# Patient Record
Sex: Male | Born: 1935 | Race: Black or African American | Hispanic: No | Marital: Single | Smoking: Former smoker
Health system: Southern US, Community
[De-identification: ages and names within clinical notes are randomized; demographics above are authoritative.]

## PROBLEM LIST (undated history)

## (undated) ENCOUNTER — Emergency Department (HOSPITAL_COMMUNITY): Admission: EM | Discharge status: Absent without leave | Payer: Self-pay

## (undated) DIAGNOSIS — N059 Unspecified nephritic syndrome with unspecified morphologic changes: Secondary | ICD-10-CM

## (undated) DIAGNOSIS — R0602 Shortness of breath: Secondary | ICD-10-CM

## (undated) DIAGNOSIS — D649 Anemia, unspecified: Secondary | ICD-10-CM

## (undated) DIAGNOSIS — R0600 Dyspnea, unspecified: Secondary | ICD-10-CM

## (undated) DIAGNOSIS — E7800 Pure hypercholesterolemia, unspecified: Secondary | ICD-10-CM

## (undated) DIAGNOSIS — J189 Pneumonia, unspecified organism: Secondary | ICD-10-CM

## (undated) DIAGNOSIS — I10 Essential (primary) hypertension: Secondary | ICD-10-CM

## (undated) DIAGNOSIS — Z8679 Personal history of other diseases of the circulatory system: Secondary | ICD-10-CM

## (undated) DIAGNOSIS — M1990 Unspecified osteoarthritis, unspecified site: Secondary | ICD-10-CM

## (undated) DIAGNOSIS — E119 Type 2 diabetes mellitus without complications: Secondary | ICD-10-CM

## (undated) DIAGNOSIS — Z8701 Personal history of pneumonia (recurrent): Secondary | ICD-10-CM

## (undated) DIAGNOSIS — N189 Chronic kidney disease, unspecified: Secondary | ICD-10-CM

## (undated) DIAGNOSIS — N289 Disorder of kidney and ureter, unspecified: Secondary | ICD-10-CM

## (undated) DIAGNOSIS — I499 Cardiac arrhythmia, unspecified: Secondary | ICD-10-CM

## (undated) DIAGNOSIS — R55 Syncope and collapse: Secondary | ICD-10-CM

## (undated) DIAGNOSIS — J40 Bronchitis, not specified as acute or chronic: Secondary | ICD-10-CM

## (undated) HISTORY — PX: HYDROCELE EXCISION: SHX482

## (undated) HISTORY — PX: CATARACT EXTRACTION W/ INTRAOCULAR LENS IMPLANT: SHX1309

---

## 2001-06-15 ENCOUNTER — Encounter: Payer: Self-pay | Admitting: Cardiology

## 2001-06-15 ENCOUNTER — Encounter: Admission: RE | Admit: 2001-06-15 | Discharge: 2001-06-15 | Payer: Self-pay | Admitting: Cardiology

## 2002-04-21 ENCOUNTER — Emergency Department (HOSPITAL_COMMUNITY): Admission: EM | Admit: 2002-04-21 | Discharge: 2002-04-21 | Payer: Self-pay | Admitting: Emergency Medicine

## 2002-07-11 ENCOUNTER — Encounter: Payer: Self-pay | Admitting: Emergency Medicine

## 2002-07-11 ENCOUNTER — Emergency Department (HOSPITAL_COMMUNITY): Admission: EM | Admit: 2002-07-11 | Discharge: 2002-07-11 | Payer: Self-pay | Admitting: Emergency Medicine

## 2005-05-03 ENCOUNTER — Ambulatory Visit (HOSPITAL_COMMUNITY): Admission: RE | Admit: 2005-05-03 | Discharge: 2005-05-03 | Payer: Self-pay | Admitting: Internal Medicine

## 2005-05-17 ENCOUNTER — Emergency Department (HOSPITAL_COMMUNITY): Admission: EM | Admit: 2005-05-17 | Discharge: 2005-05-18 | Payer: Self-pay | Admitting: Emergency Medicine

## 2008-02-25 ENCOUNTER — Emergency Department (HOSPITAL_COMMUNITY): Admission: EM | Admit: 2008-02-25 | Discharge: 2008-02-25 | Payer: Self-pay | Admitting: Emergency Medicine

## 2008-10-14 ENCOUNTER — Ambulatory Visit (HOSPITAL_COMMUNITY): Admission: RE | Admit: 2008-10-14 | Discharge: 2008-10-14 | Payer: Self-pay | Admitting: Cardiology

## 2010-05-18 ENCOUNTER — Emergency Department (HOSPITAL_COMMUNITY)
Admission: EM | Admit: 2010-05-18 | Discharge: 2010-05-18 | Payer: Self-pay | Source: Home / Self Care | Admitting: Emergency Medicine

## 2011-07-31 ENCOUNTER — Emergency Department (HOSPITAL_COMMUNITY)
Admission: EM | Admit: 2011-07-31 | Discharge: 2011-08-01 | Disposition: A | Discharge status: Home / Self Care | Payer: Medicare Other | Attending: Emergency Medicine | Admitting: Emergency Medicine

## 2011-07-31 DIAGNOSIS — E1169 Type 2 diabetes mellitus with other specified complication: Secondary | ICD-10-CM | POA: Insufficient documentation

## 2011-07-31 DIAGNOSIS — I10 Essential (primary) hypertension: Secondary | ICD-10-CM | POA: Insufficient documentation

## 2011-07-31 DIAGNOSIS — E7800 Pure hypercholesterolemia, unspecified: Secondary | ICD-10-CM | POA: Insufficient documentation

## 2011-07-31 LAB — URINALYSIS, ROUTINE W REFLEX MICROSCOPIC
Glucose, UA: NEGATIVE mg/dL
Hgb urine dipstick: NEGATIVE
Ketones, ur: NEGATIVE mg/dL
Leukocytes, UA: NEGATIVE
Nitrite: NEGATIVE
Protein, ur: NEGATIVE mg/dL
Specific Gravity, Urine: 1.017 (ref 1.005–1.030)
pH: 7 (ref 5.0–8.0)

## 2011-07-31 LAB — DIFFERENTIAL
Basophils Relative: 0 % (ref 0–1)
Eosinophils Absolute: 0.2 10*3/uL (ref 0.0–0.7)
Eosinophils Relative: 4 % (ref 0–5)
Neutro Abs: 3.4 10*3/uL (ref 1.7–7.7)

## 2011-08-01 LAB — COMPREHENSIVE METABOLIC PANEL
BUN: 17 mg/dL (ref 6–23)
CO2: 35 mEq/L — ABNORMAL HIGH (ref 19–32)
Calcium: 9.5 mg/dL (ref 8.4–10.5)
Creatinine, Ser: 1.05 mg/dL (ref 0.50–1.35)
Glucose, Bld: 163 mg/dL — ABNORMAL HIGH (ref 70–99)
Potassium: 3.4 mEq/L — ABNORMAL LOW (ref 3.5–5.1)
Total Bilirubin: 0.2 mg/dL — ABNORMAL LOW (ref 0.3–1.2)
Total Protein: 7.8 g/dL (ref 6.0–8.3)

## 2011-08-09 LAB — COMPREHENSIVE METABOLIC PANEL
AST: 25
Albumin: 3.5
Alkaline Phosphatase: 42
BUN: 9
CO2: 35 — ABNORMAL HIGH
Calcium: 8.8
Chloride: 98
Creatinine, Ser: 1.15
GFR calc Af Amer: >60
Potassium: 3.3 — ABNORMAL LOW
Sodium: 140
Total Bilirubin: 0.7

## 2011-08-09 LAB — CBC
HCT: 40.3
Hemoglobin: 13.9
MCV: 79.7
RBC: 5.06
WBC: 5.4

## 2011-08-09 LAB — DIFFERENTIAL
Lymphs Abs: 1.7
Monocytes Absolute: 0.7
Neutro Abs: 2.9

## 2011-11-21 ENCOUNTER — Emergency Department (HOSPITAL_COMMUNITY)
Admission: EM | Admit: 2011-11-21 | Discharge: 2011-11-21 | Disposition: A | Discharge status: Home / Self Care | Payer: Medicare Other | Attending: Emergency Medicine | Admitting: Emergency Medicine

## 2011-11-21 ENCOUNTER — Encounter: Payer: Self-pay | Admitting: Emergency Medicine

## 2011-11-21 ENCOUNTER — Other Ambulatory Visit: Payer: Self-pay

## 2011-11-21 ENCOUNTER — Emergency Department (HOSPITAL_COMMUNITY): Payer: Medicare Other

## 2011-11-21 DIAGNOSIS — E119 Type 2 diabetes mellitus without complications: Secondary | ICD-10-CM | POA: Insufficient documentation

## 2011-11-21 DIAGNOSIS — R29810 Facial weakness: Secondary | ICD-10-CM | POA: Insufficient documentation

## 2011-11-21 DIAGNOSIS — I10 Essential (primary) hypertension: Secondary | ICD-10-CM | POA: Insufficient documentation

## 2011-11-21 DIAGNOSIS — I639 Cerebral infarction, unspecified: Secondary | ICD-10-CM

## 2011-11-21 DIAGNOSIS — I6350 Cerebral infarction due to unspecified occlusion or stenosis of unspecified cerebral artery: Secondary | ICD-10-CM | POA: Insufficient documentation

## 2011-11-21 DIAGNOSIS — R42 Dizziness and giddiness: Secondary | ICD-10-CM | POA: Insufficient documentation

## 2011-11-21 DIAGNOSIS — E7800 Pure hypercholesterolemia, unspecified: Secondary | ICD-10-CM | POA: Insufficient documentation

## 2011-11-21 DIAGNOSIS — Z79899 Other long term (current) drug therapy: Secondary | ICD-10-CM | POA: Insufficient documentation

## 2011-11-21 DIAGNOSIS — L0231 Cutaneous abscess of buttock: Secondary | ICD-10-CM | POA: Insufficient documentation

## 2011-11-21 HISTORY — DX: Pure hypercholesterolemia, unspecified: E78.00

## 2011-11-21 HISTORY — DX: Essential (primary) hypertension: I10

## 2011-11-21 LAB — DIFFERENTIAL
Basophils Relative: 0 % (ref 0–1)
Monocytes Relative: 8 % (ref 3–12)
Neutrophils Relative %: 66 % (ref 43–77)

## 2011-11-21 LAB — BASIC METABOLIC PANEL
BUN: 27 mg/dL — ABNORMAL HIGH (ref 6–23)
CO2: 32 mEq/L (ref 19–32)
Calcium: 9 mg/dL (ref 8.4–10.5)
Chloride: 98 mEq/L (ref 96–112)
GFR calc Af Amer: 28 mL/min — ABNORMAL LOW (ref >90)
GFR calc non Af Amer: 24 mL/min — ABNORMAL LOW (ref >90)

## 2011-11-21 LAB — CBC
MCH: 26.5 pg (ref 26.0–34.0)
Platelets: 162 10*3/uL (ref 150–400)
RBC: 4.23 MIL/uL (ref 4.22–5.81)

## 2011-11-21 LAB — GLUCOSE, CAPILLARY
Glucose-Capillary: 117 mg/dL — ABNORMAL HIGH (ref 70–99)
Glucose-Capillary: 100 mg/dL — ABNORMAL HIGH (ref 70–99)

## 2011-11-21 LAB — TROPONIN I: Troponin I: <0.3 ng/mL (ref <0.30)

## 2011-11-21 MED ORDER — POTASSIUM CHLORIDE 20 MEQ/15ML (10%) PO LIQD
40.0000 meq | Freq: Once | ORAL | Status: AC
  Administered 2011-11-21: 40 meq via ORAL
  Filled 2011-11-21: qty 30

## 2011-11-21 MED ORDER — SODIUM CHLORIDE 0.9 % IV BOLUS (SEPSIS)
1000.0000 mL | Freq: Once | INTRAVENOUS | Status: AC
  Administered 2011-11-21: 1000 mL via INTRAVENOUS

## 2011-11-21 NOTE — ED Provider Notes (Signed)
History     CSN: [id number]  Arrival date & time 11/21/11  1419   First MD Initiated Contact with Patient 11/21/11 1717      Chief Complaint  Patient presents with  . Dizziness    (Consider location/radiation/quality/duration/timing/severity/associated sxs/prior treatment) HPI history obtained from patient, spouse Patient is a 77 year old male with history hypertension, dyslipidemia, and diabetes who presents with vertigo. This started about 5 weeks ago. There is abrupt in onset has been constant. He describes a room spinning sensation which is worse when he changes position or moves quickly. The overall waxes and wanes. He had one episode of syncope about 5 weeks ago when this first started. He has had no dysarthria or dysphasia. He is also not had any extremity weakness or sensation changes. Nothing has been noted to make the symptoms better. Patient has not had any recent cough, URI symptoms. He denies ear pain, tinnitus, neck pain. No passing out in the last 5 weeks. Patient was seen in emergency department in [place], [place] about 2 weeks ago for the same symptoms. At that time he obtained a CT scan of head, MRI head, carotid ultrasound, cardiac echo. He is unsure of the results of these tests because he left AMA (to get home to family for Christmas).  He presents today for evaluation considering persistent symptoms. He has been able to ambulate though notes some unsteadiness. He does not fall exclusively to one side. Overall severity moderate.  Past Medical History  Diagnosis Date  . Hypertension   . Diabetes mellitus   . Hypercholesterolemia     History reviewed. No pertinent past surgical history.  History reviewed. No pertinent family history.  History  Substance Use Topics  . Smoking status: Never Smoker   . Smokeless tobacco: Not on file  . Alcohol Use: No      Review of Systems  Constitutional: Negative for fever, chills and activity change.  HENT: Negative for  congestion and neck pain.   Respiratory: Negative for cough, chest tightness, shortness of breath and wheezing.   Cardiovascular: Negative for chest pain.  Gastrointestinal: Negative for nausea, vomiting, abdominal pain, diarrhea and abdominal distention.  Genitourinary: Negative for difficulty urinating.  Musculoskeletal: Negative for gait problem.  Skin:       Small abscess on left buttock that drained spontaneously today. No surrounding redness. Pain improved after drainage.  Neurological: Positive for dizziness. Negative for seizures, speech difficulty, weakness, light-headedness, numbness and headaches.  Psychiatric/Behavioral: Negative for behavioral problems and confusion.  All other systems reviewed and are negative.    Allergies  Review of patient's allergies indicates no known allergies.  Home Medications   Current Outpatient Rx  Name Route Sig Dispense Refill  . ALLOPURINOL 100 MG PO TABS Oral Take 100 mg by mouth daily.      . ASPIRIN 81 MG PO TBEC Oral Take 81 mg by mouth daily. Swallow whole.     [name] CARVEDILOL 3.125 MG PO TABS Oral Take 3.125 mg by mouth 2 (two) times daily with a meal.      . COLCHICINE 0.6 MG PO TABS Oral Take 0.6 mg by mouth 2 (two) times daily as needed. For gout     . DUTASTERIDE 0.5 MG PO CAPS Oral Take 0.5 mg by mouth daily.      [name] GLIMEPIRIDE 4 MG PO TABS Oral Take 4 mg by mouth daily before breakfast.      . GLIPIZIDE 10 MG PO TABS Oral Take 10 mg by  mouth 2 (two) times daily before a meal.      . GLYBURIDE-METFORMIN 5-500 MG PO TABS Oral Take 1 tablet by mouth 2 (two) times daily with a meal.      . HYDRALAZINE HCL 100 MG PO TABS Oral Take 100 mg by mouth 2 (two) times daily.      [name] NAPROXEN SODIUM 220 MG PO TABS Oral Take 220 mg by mouth 2 (two) times daily with a meal.      . TAMSULOSIN HCL 0.4 MG PO CAPS Oral Take 0.4 mg by mouth daily.      [name] TERAZOSIN HCL 5 MG PO CAPS Oral Take 5 mg by mouth at bedtime.      [name] VALSARTAN-HYDROCHLOROTHIAZIDE  320-25 MG PO TABS Oral Take 1 tablet by mouth daily.      [name] VITAMIN D (ERGOCALCIFEROL) 50000 UNITS PO CAPS Oral Take 50,000 Units by mouth every 7 (seven) days. Take on sundays       BP 173/80  Pulse 61  Temp(Src) 98.4 F (36.9 C) (Oral)  Resp 16  SpO2 99%  Physical Exam  Nursing note and vitals reviewed. Constitutional: He is oriented to person, place, and time. He appears well-developed and well-nourished. No distress.  HENT:  Head: Normocephalic and atraumatic.  Nose: Nose normal.  Eyes: EOM are normal. Pupils are equal, round, and reactive to light. No scleral icterus.  Neck: Normal range of motion. Neck supple.  Cardiovascular: Normal rate, regular rhythm and intact distal pulses.   No murmur heard. Pulmonary/Chest: Effort normal and breath sounds normal. No respiratory distress. He has no wheezes.  Abdominal: Soft. Bowel sounds are normal. He exhibits no distension. There is no tenderness.  Genitourinary:       Small abscess on left buttock. Several inches from the anus. This has drained spontaneously and has minimal drainage at this time. Opening present. Minimal induration. No drainable pocket on palpation.  Musculoskeletal: Normal range of motion. He exhibits no edema and no tenderness.  Neurological: He is alert and oriented to person, place, and time. He has normal strength. No sensory deficit. Coordination and gait normal. GCS eye subscore is 4. GCS verbal subscore is 5. GCS motor subscore is 6.       Normal strength Normal finger to nose bilaterally Normal heel to shin bilaterally  Mild left-sided facial droop, however spouse does not feel like his smile significantly different. No other cranial nerve abnormality on exam.  Patient able to ambulate all the way around emergency Department without assistance.  Skin: Skin is warm and dry. He is not diaphoretic.  Psychiatric: He has a normal mood and affect. His behavior is normal. Thought content normal.    ED Course    Procedures (including critical care time)  ECG on 11/21/2011 at 1740: Heart rate 60. Sinus rhythm. Normal axis. Right bundle branch block. CT normal. Nonspecific T wave change on anteriorly. No ischemic ST changes. When compared to ECG from 05/17/2005, no significant changes. Right bundle branch block present on prior.  Labs Reviewed  CBC - Abnormal; Notable for the following:    Hemoglobin 11.2 (*)    HCT 33.2 (*)    All other components within normal limits  BASIC METABOLIC PANEL - Abnormal; Notable for the following:    Potassium 3.3 (*)    Glucose, Bld 115 (*)    BUN 27 (*)    Creatinine, Ser 2.42 (*)    GFR calc non Af Amer 24 (*)    GFR  calc Af Amer 28 (*)    All other components within normal limits  GLUCOSE, CAPILLARY - Abnormal; Notable for the following:    Glucose-Capillary 117 (*)    All other components within normal limits  GLUCOSE, CAPILLARY - Abnormal; Notable for the following:    Glucose-Capillary 100 (*)    All other components within normal limits  DIFFERENTIAL  TROPONIN I  POCT CBG MONITORING  POCT CBG MONITORING   Ct Head Wo Contrast  11/21/2011  *RADIOLOGY REPORT*  Clinical Data: Dizziness and weakness  CT HEAD WITHOUT CONTRAST  Technique:  Contiguous axial images were obtained from the base of the skull through the vertex without contrast.  Comparison: None.  Findings: No acute intracranial hemorrhage.  No focal mass lesion. No CT evidence of acute infarction.   No midline shift or mass effect.  No hydrocephalus.  Basilar cisterns are patent.  There is generalized cortical atrophy and periventricular white matter hypodensities.  Paranasal sinuses and mastoid air cells are clear.  Orbits are normal.  IMPRESSION:  1.  No acute intracranial findings. 2.  Atrophy and microvascular disease.  Original Report Authenticated By: Suzy Bouchard, M.D.     1. Acute ischemic stroke   2. Abscess of buttock, left       MDM   Patient here with acute onset vertigo  that started about 5 weeks ago. Based on his history and risk factors, concern for cerebellar infarct. He was seen and extensively worked up at [facility] in [place], however he does not have those results. His neurologic exam shows probable mild left facial droop otherwise no focal neurologic deficits. CT scan here unremarkable. Multiple attempts made to obtain records from [place], however medical records is closing [place] ED unable to compile those results. Since he did have such an extensive workup in the setting of same symptoms, no need to repeat MRI today. Patient is able to tolerate food and drink and emergency department. He is also able to ambulate without assistance and without fear of falling. He clinically has acute stroke and will need continued evaluation. It is also critical to obtain outside records from prior workup. Patient has follow up appointment with his primary care doctor already scheduled for tomorrow morning. Based on patient's longevity of symptoms, previous workup, and currently high functional status; there is no acute indication for admission. Patient and his spouse are in agreement with this. He'll follow up with her primary care doctor and follow up with neurology outpatient. We discussed specific return precautions.  Patient also has mild increase in his creatinine. This is likely prerenal based on decreased by mouth intake over the last couple days and elevated BUN as well. IV fluids given and patient tolerating by mouth well here. Recommend outpatient followup of renal function. I discussed these findings with patient and spouse and provided the lab results.  Patient noted to have increasing hypertension lung emergency department. He did miss his evening dose of hydralazine and carvedilol. Those were given from his own supply. No indication that presenting symptoms are related to this acute hypertension.  He does have mild left buttock abscess which drained on its own. There is  nothing drainable on evaluation today. He was to followup with his PCP for this.        Fritz Pickerel 11/21/11 2327

## 2011-11-21 NOTE — ED Notes (Signed)
CBG results = 100.

## 2011-11-21 NOTE — ED Notes (Signed)
Patient is AOx4 at comfortable with the idea of being discharged to home.

## 2011-11-21 NOTE — ED Notes (Signed)
Patient declined CBG.

## 2011-11-21 NOTE — ED Notes (Signed)
Advised MD patient's K+ = 3.3.

## 2011-11-21 NOTE — ED Notes (Signed)
Family at bedside. 

## 2011-11-21 NOTE — ED Notes (Signed)
Pt sts dizzy since December 23rd; pt sts was in hospital in [place] and signed out [place]; pt sts not any better; pt sts generalized weakness and sts balance problems; pt sts worse with position change

## 2011-11-21 NOTE — ED Notes (Signed)
Patient took home bedtime meds for HTN, BPH and arthritis.  Patient took one Hydralazine, Coreg, Flomax and Aleve per Dr Abran Duke order.

## 2011-11-21 NOTE — ED Notes (Signed)
Patient is resting comfortably. 

## 2011-11-22 NOTE — ED Provider Notes (Signed)
I have seen and examined this patient with the resident.  I agree with the resident's note, assessment and plan except as indicated.     Lezlie Octave, MD 11/22/11 [phone]

## 2011-11-23 ENCOUNTER — Inpatient Hospital Stay (HOSPITAL_COMMUNITY)
Admission: EM | Admit: 2011-11-23 | Discharge: 2011-12-17 | DRG: 683 | Disposition: A | Discharge status: Skilled Nursing Facility | Payer: Medicare Other | Source: Ambulatory Visit | Attending: Nephrology | Admitting: Nephrology

## 2011-11-23 ENCOUNTER — Encounter (HOSPITAL_COMMUNITY): Payer: Self-pay | Admitting: Emergency Medicine

## 2011-11-23 ENCOUNTER — Inpatient Hospital Stay (HOSPITAL_COMMUNITY): Payer: Medicare Other

## 2011-11-23 ENCOUNTER — Other Ambulatory Visit: Payer: Self-pay

## 2011-11-23 ENCOUNTER — Emergency Department (HOSPITAL_COMMUNITY): Payer: Medicare Other

## 2011-11-23 DIAGNOSIS — Z8249 Family history of ischemic heart disease and other diseases of the circulatory system: Secondary | ICD-10-CM

## 2011-11-23 DIAGNOSIS — Z833 Family history of diabetes mellitus: Secondary | ICD-10-CM

## 2011-11-23 DIAGNOSIS — E119 Type 2 diabetes mellitus without complications: Secondary | ICD-10-CM | POA: Diagnosis present

## 2011-11-23 DIAGNOSIS — H8110 Benign paroxysmal vertigo, unspecified ear: Secondary | ICD-10-CM | POA: Diagnosis present

## 2011-11-23 DIAGNOSIS — Y9210 Unspecified residential institution as the place of occurrence of the external cause: Secondary | ICD-10-CM | POA: Diagnosis present

## 2011-11-23 DIAGNOSIS — R310 Gross hematuria: Secondary | ICD-10-CM | POA: Diagnosis not present

## 2011-11-23 DIAGNOSIS — D509 Iron deficiency anemia, unspecified: Secondary | ICD-10-CM | POA: Diagnosis present

## 2011-11-23 DIAGNOSIS — E876 Hypokalemia: Secondary | ICD-10-CM | POA: Diagnosis not present

## 2011-11-23 DIAGNOSIS — E1169 Type 2 diabetes mellitus with other specified complication: Secondary | ICD-10-CM | POA: Diagnosis present

## 2011-11-23 DIAGNOSIS — Z79899 Other long term (current) drug therapy: Secondary | ICD-10-CM

## 2011-11-23 DIAGNOSIS — E7800 Pure hypercholesterolemia, unspecified: Secondary | ICD-10-CM | POA: Diagnosis present

## 2011-11-23 DIAGNOSIS — E162 Hypoglycemia, unspecified: Secondary | ICD-10-CM | POA: Diagnosis present

## 2011-11-23 DIAGNOSIS — E785 Hyperlipidemia, unspecified: Secondary | ICD-10-CM | POA: Diagnosis present

## 2011-11-23 DIAGNOSIS — N179 Acute kidney failure, unspecified: Principal | ICD-10-CM | POA: Diagnosis present

## 2011-11-23 DIAGNOSIS — I10 Essential (primary) hypertension: Secondary | ICD-10-CM | POA: Diagnosis present

## 2011-11-23 DIAGNOSIS — IMO0002 Reserved for concepts with insufficient information to code with codable children: Secondary | ICD-10-CM | POA: Diagnosis not present

## 2011-11-23 DIAGNOSIS — M109 Gout, unspecified: Secondary | ICD-10-CM | POA: Diagnosis present

## 2011-11-23 DIAGNOSIS — R042 Hemoptysis: Secondary | ICD-10-CM | POA: Diagnosis not present

## 2011-11-23 DIAGNOSIS — I129 Hypertensive chronic kidney disease with stage 1 through stage 4 chronic kidney disease, or unspecified chronic kidney disease: Secondary | ICD-10-CM | POA: Diagnosis present

## 2011-11-23 DIAGNOSIS — Z794 Long term (current) use of insulin: Secondary | ICD-10-CM

## 2011-11-23 DIAGNOSIS — D62 Acute posthemorrhagic anemia: Secondary | ICD-10-CM | POA: Diagnosis not present

## 2011-11-23 DIAGNOSIS — R55 Syncope and collapse: Secondary | ICD-10-CM

## 2011-11-23 DIAGNOSIS — N189 Chronic kidney disease, unspecified: Secondary | ICD-10-CM | POA: Diagnosis present

## 2011-11-23 DIAGNOSIS — R42 Dizziness and giddiness: Secondary | ICD-10-CM | POA: Diagnosis present

## 2011-11-23 DIAGNOSIS — Z87891 Personal history of nicotine dependence: Secondary | ICD-10-CM

## 2011-11-23 DIAGNOSIS — Y849 Medical procedure, unspecified as the cause of abnormal reaction of the patient, or of later complication, without mention of misadventure at the time of the procedure: Secondary | ICD-10-CM | POA: Diagnosis present

## 2011-11-23 DIAGNOSIS — Z7982 Long term (current) use of aspirin: Secondary | ICD-10-CM

## 2011-11-23 DIAGNOSIS — N19 Unspecified kidney failure: Secondary | ICD-10-CM | POA: Diagnosis present

## 2011-11-23 DIAGNOSIS — R5381 Other malaise: Secondary | ICD-10-CM | POA: Diagnosis not present

## 2011-11-23 HISTORY — DX: Syncope and collapse: R55

## 2011-11-23 HISTORY — DX: Unspecified osteoarthritis, unspecified site: M19.90

## 2011-11-23 HISTORY — DX: Shortness of breath: R06.02

## 2011-11-23 HISTORY — DX: Bronchitis, not specified as acute or chronic: J40

## 2011-11-23 LAB — COMPREHENSIVE METABOLIC PANEL
AST: 21 U/L (ref 0–37)
Albumin: 3 g/dL — ABNORMAL LOW (ref 3.5–5.2)
Alkaline Phosphatase: 47 U/L (ref 39–117)
Chloride: 100 mEq/L (ref 96–112)
Creatinine, Ser: 2.55 mg/dL — ABNORMAL HIGH (ref 0.50–1.35)
Potassium: 3.7 mEq/L (ref 3.5–5.1)
Total Bilirubin: 0.3 mg/dL (ref 0.3–1.2)
Total Protein: 7.2 g/dL (ref 6.0–8.3)

## 2011-11-23 LAB — CARDIAC PANEL(CRET KIN+CKTOT+MB+TROPI)
CK, MB: 1.7 ng/mL (ref 0.3–4.0)
Relative Index: INVALID (ref 0.0–2.5)
Total CK: 87 U/L (ref 7–232)

## 2011-11-23 LAB — DIFFERENTIAL
Basophils Absolute: 0 10*3/uL (ref 0.0–0.1)
Basophils Relative: 0 % (ref 0–1)
Neutro Abs: 3.7 10*3/uL (ref 1.7–7.7)
Neutrophils Relative %: 68 % (ref 43–77)

## 2011-11-23 LAB — CBC
MCHC: 34.1 g/dL (ref 30.0–36.0)
RDW: 14.2 % (ref 11.5–15.5)

## 2011-11-23 LAB — URINE MICROSCOPIC-ADD ON

## 2011-11-23 LAB — GLUCOSE, CAPILLARY: Glucose-Capillary: 172 mg/dL — ABNORMAL HIGH (ref 70–99)

## 2011-11-23 LAB — URINALYSIS, ROUTINE W REFLEX MICROSCOPIC
Glucose, UA: NEGATIVE mg/dL
Protein, ur: NEGATIVE mg/dL
pH: 6 (ref 5.0–8.0)

## 2011-11-23 LAB — PROTIME-INR: Prothrombin Time: 14.3 seconds (ref 11.6–15.2)

## 2011-11-23 LAB — APTT: aPTT: 42 seconds — ABNORMAL HIGH (ref 24–37)

## 2011-11-23 MED ORDER — CARVEDILOL 3.125 MG PO TABS
3.1250 mg | ORAL_TABLET | Freq: Two times a day (BID) | ORAL | Status: DC
  Administered 2011-11-24 – 2011-11-26 (×5): 3.125 mg via ORAL
  Filled 2011-11-23 (×8): qty 1

## 2011-11-23 MED ORDER — GLUCOSE 40 % PO GEL: 1.0000 | ORAL | Status: DC | PRN

## 2011-11-23 MED ORDER — ACETAMINOPHEN 325 MG PO TABS
650.0000 mg | ORAL_TABLET | Freq: Four times a day (QID) | ORAL | Status: DC | PRN
  Administered 2011-12-09: 650 mg via ORAL
  Filled 2011-11-23: qty 2

## 2011-11-23 MED ORDER — PNEUMOCOCCAL VAC POLYVALENT 25 MCG/0.5ML IJ INJ
0.5000 mL | INJECTION | INTRAMUSCULAR | Status: AC
  Administered 2011-11-24: 0.5 mL via INTRAMUSCULAR
  Filled 2011-11-23: qty 0.5

## 2011-11-23 MED ORDER — MECLIZINE HCL 25 MG PO TABS
25.0000 mg | ORAL_TABLET | Freq: Three times a day (TID) | ORAL | Status: DC | PRN
  Administered 2011-11-24: 25 mg via ORAL
  Filled 2011-11-23: qty 1

## 2011-11-23 MED ORDER — ONDANSETRON HCL 4 MG/2ML IJ SOLN: 4.0000 mg | Freq: Four times a day (QID) | INTRAMUSCULAR | Status: DC | PRN

## 2011-11-23 MED ORDER — TAMSULOSIN HCL 0.4 MG PO CAPS
0.4000 mg | ORAL_CAPSULE | Freq: Every day | ORAL | Status: DC
  Administered 2011-11-24 – 2011-12-05 (×12): 0.4 mg via ORAL
  Filled 2011-11-23 (×13): qty 1

## 2011-11-23 MED ORDER — ALBUTEROL SULFATE (5 MG/ML) 0.5% IN NEBU
2.5000 mg | INHALATION_SOLUTION | RESPIRATORY_TRACT | Status: DC | PRN
  Administered 2011-11-29: 2.5 mg via RESPIRATORY_TRACT
  Filled 2011-11-23: qty 0.5

## 2011-11-23 MED ORDER — ACETAMINOPHEN 650 MG RE SUPP: 650.0000 mg | Freq: Four times a day (QID) | RECTAL | Status: DC | PRN

## 2011-11-23 MED ORDER — DUTASTERIDE 0.5 MG PO CAPS
0.5000 mg | ORAL_CAPSULE | Freq: Every day | ORAL | Status: DC
  Administered 2011-11-24 – 2011-12-17 (×23): 0.5 mg via ORAL
  Filled 2011-11-23 (×24): qty 1

## 2011-11-23 MED ORDER — ALUM & MAG HYDROXIDE-SIMETH 200-200-20 MG/5ML PO SUSP
30.0000 mL | Freq: Four times a day (QID) | ORAL | Status: DC | PRN
  Filled 2011-11-23: qty 30

## 2011-11-23 MED ORDER — GLUCOSE-VITAMIN C 4-6 GM-MG PO CHEW: 4.0000 | CHEWABLE_TABLET | ORAL | Status: DC | PRN

## 2011-11-23 MED ORDER — GUAIFENESIN-DM 100-10 MG/5ML PO SYRP
5.0000 mL | ORAL_SOLUTION | ORAL | Status: DC | PRN
  Filled 2011-11-23: qty 5

## 2011-11-23 MED ORDER — SODIUM CHLORIDE 0.9 % IV SOLN
INTRAVENOUS | Status: AC
  Administered 2011-11-23: via INTRAVENOUS

## 2011-11-23 MED ORDER — ONDANSETRON HCL 4 MG PO TABS
4.0000 mg | ORAL_TABLET | Freq: Four times a day (QID) | ORAL | Status: DC | PRN
  Administered 2011-12-13: 4 mg via ORAL
  Filled 2011-11-23: qty 1

## 2011-11-23 MED ORDER — TERAZOSIN HCL 5 MG PO CAPS
5.0000 mg | ORAL_CAPSULE | Freq: Every day | ORAL | Status: DC
  Administered 2011-11-24 – 2011-12-05 (×12): 5 mg via ORAL
  Filled 2011-11-23 (×14): qty 1

## 2011-11-23 MED ORDER — ALLOPURINOL 100 MG PO TABS
100.0000 mg | ORAL_TABLET | Freq: Every day | ORAL | Status: DC
  Administered 2011-11-24 – 2011-11-27 (×4): 100 mg via ORAL
  Filled 2011-11-23 (×4): qty 1

## 2011-11-23 MED ORDER — HYDRALAZINE HCL 50 MG PO TABS
100.0000 mg | ORAL_TABLET | Freq: Two times a day (BID) | ORAL | Status: DC
  Administered 2011-11-23 – 2011-12-17 (×44): 100 mg via ORAL
  Filled 2011-11-23 (×51): qty 2

## 2011-11-23 MED ORDER — INFLUENZA VIRUS VACC SPLIT PF IM SUSP
0.5000 mL | INTRAMUSCULAR | Status: AC
  Administered 2011-11-24: 0.5 mL via INTRAMUSCULAR
  Filled 2011-11-23: qty 0.5

## 2011-11-23 MED ORDER — ASPIRIN EC 81 MG PO TBEC
81.0000 mg | DELAYED_RELEASE_TABLET | Freq: Every day | ORAL | Status: DC
  Administered 2011-11-24 – 2011-11-29 (×6): 81 mg via ORAL
  Filled 2011-11-23 (×6): qty 1

## 2011-11-23 MED ORDER — INSULIN ASPART 100 UNIT/ML ~~LOC~~ SOLN
0.0000 [IU] | SUBCUTANEOUS | Status: DC
  Administered 2011-11-24 – 2011-11-25 (×4): 1 [IU] via SUBCUTANEOUS
  Administered 2011-11-25: 2 [IU] via SUBCUTANEOUS
  Administered 2011-11-25 (×2): 1 [IU] via SUBCUTANEOUS
  Administered 2011-11-25 – 2011-11-26 (×2): 2 [IU] via SUBCUTANEOUS
  Administered 2011-11-26 (×2): 1 [IU] via SUBCUTANEOUS
  Administered 2011-11-27: 3 [IU] via SUBCUTANEOUS
  Administered 2011-11-27: 1 [IU] via SUBCUTANEOUS
  Administered 2011-11-27: 2 [IU] via SUBCUTANEOUS
  Administered 2011-11-28: 1 [IU] via SUBCUTANEOUS
  Administered 2011-11-28: 2 [IU] via SUBCUTANEOUS
  Filled 2011-11-23 (×2): qty 3

## 2011-11-23 NOTE — ED Notes (Signed)
Care transferred and report given to [place], [place]

## 2011-11-23 NOTE — ED Notes (Signed)
Pt placed in gown, on monitor, with continuous blood pressure and pulse oximetry.  Family at bedside

## 2011-11-23 NOTE — ED Provider Notes (Signed)
History     CSN: [id number]  Arrival date & time 11/23/11  1321   First MD Initiated Contact with Patient 11/23/11 1421      Chief Complaint  Patient presents with  . Loss of Consciousness    (Consider location/radiation/quality/duration/timing/severity/associated sxs/prior treatment) HPI Comments: Patient is a 78 year old man who had a syncopal episode while sitting in a chair today. He is a patient with chronic illnesses, diabetes, gout, hypertension, and dizziness. He been seen at a hospital in [place] for an episode of faintness. He was seen here 2 days ago, and had workup, which was essentially normal. He had his episode of near syncope, and therefore was brought back to the hospital for evaluation.  Patient is a 78 y.o. male presenting with syncope. The history is provided by the patient and the spouse. No language interpreter was used.  Loss of Consciousness This is a new problem. The current episode started less than 1 hour ago. The problem has not changed since onset.Pertinent negatives include no chest pain. Associated symptoms comments: Dizziness, prior episodes of hypoglycemia.. The symptoms are aggravated by nothing. The symptoms are relieved by nothing. He has tried nothing for the symptoms.    Past Medical History  Diagnosis Date  . Hypertension   . Diabetes mellitus   . Hypercholesterolemia     History reviewed. No pertinent past surgical history.  History reviewed. No pertinent family history.  History  Substance Use Topics  . Smoking status: Never Smoker   . Smokeless tobacco: Not on file  . Alcohol Use: No      Review of Systems  Constitutional: Negative.   HENT: Negative.   Eyes: Negative.   Respiratory: Negative.   Cardiovascular: Positive for syncope. Negative for chest pain and leg swelling.  Gastrointestinal: Negative.   Genitourinary: Negative.   Musculoskeletal: Negative.   Skin: Negative.   Neurological: Positive for syncope.    Psychiatric/Behavioral: Negative.     Allergies  Review of patient's allergies indicates no known allergies.  Home Medications   Current Outpatient Rx  Name Route Sig Dispense Refill  . ALLOPURINOL 100 MG PO TABS Oral Take 100 mg by mouth daily.     . ASPIRIN 81 MG PO TBEC Oral Take 81 mg by mouth daily. Swallow whole.    [name] CARVEDILOL 3.125 MG PO TABS Oral Take 3.125 mg by mouth 2 (two) times daily with a meal.     . COLCHICINE 0.6 MG PO TABS Oral Take 0.6 mg by mouth 2 (two) times daily as needed. For gout    . DUTASTERIDE 0.5 MG PO CAPS Oral Take 0.5 mg by mouth daily.     [name] GLIMEPIRIDE 4 MG PO TABS Oral Take 4 mg by mouth daily as needed. WHEN BLOOD SUGAR RUNS TOO HIGH    . HYDRALAZINE HCL 100 MG PO TABS Oral Take 100 mg by mouth 2 (two) times daily.      . INSULIN GLARGINE 100 UNIT/ML [place] SOLN Subcutaneous Inject 10 Units into the skin daily as needed. STOPPED BECAUSE BLOOD SUGAR RAN TOO LOW...STILL HAS SOME IF NEEDED    . MECLIZINE HCL 25 MG PO TABS Oral Take 25 mg by mouth 3 (three) times daily as needed. DIZZINESS    . NAPROXEN SODIUM 220 MG PO TABS Oral Take 220 mg by mouth 2 (two) times daily with a meal.      . TAMSULOSIN HCL 0.4 MG PO CAPS Oral Take 0.4 mg by mouth daily.      [name]  TERAZOSIN HCL 5 MG PO CAPS Oral Take 5 mg by mouth daily.     [name] VALSARTAN-HYDROCHLOROTHIAZIDE 320-25 MG PO TABS Oral Take 1 tablet by mouth daily.      [name] VITAMIN D (ERGOCALCIFEROL) 50000 UNITS PO CAPS Oral Take 50,000 Units by mouth every 7 (seven) days. Take on sundays       BP 184/70  Pulse 54  Temp(Src) 98.6 F (37 C) (Oral)  Resp 14  SpO2 100%  Physical Exam  Nursing note and vitals reviewed. Constitutional: He is oriented to person, place, and time. He appears well-developed and well-nourished.       Seems drowsy, but readily arousable and answers questions.  Eyes: Conjunctivae and EOM are normal. Pupils are equal, round, and reactive to light. No scleral icterus.  Neck: Normal range of  motion. Neck supple.  Cardiovascular: Normal rate and regular rhythm.   Pulmonary/Chest: Effort normal and breath sounds normal.  Abdominal: Soft. Bowel sounds are normal.  Musculoskeletal: Normal range of motion.  Neurological: He is alert and oriented to person, place, and time.       Sensory or motor deficit.  Skin: Skin is warm and dry.  Psychiatric: He has a normal mood and affect. His behavior is normal.    ED Course  Procedures (including critical care time)  Labs Reviewed  GLUCOSE, CAPILLARY - Abnormal; Notable for the following:    Glucose-Capillary 172 (*)    All other components within normal limits  CBC - Abnormal; Notable for the following:    RBC 4.01 (*)    Hemoglobin 10.7 (*)    HCT 31.4 (*)    All other components within normal limits  COMPREHENSIVE METABOLIC PANEL - Abnormal; Notable for the following:    Glucose, Bld 149 (*)    BUN 24 (*)    Creatinine, Ser 2.55 (*)    Albumin 3.0 (*)    GFR calc non Af Amer 23 (*)    GFR calc Af Amer 26 (*)    All other components within normal limits  URINALYSIS, ROUTINE W REFLEX MICROSCOPIC - Abnormal; Notable for the following:    APPearance CLOUDY (*)    Hgb urine dipstick LARGE (*)    All other components within normal limits  APTT - Abnormal; Notable for the following:    aPTT 42 (*)    All other components within normal limits  DIFFERENTIAL  PROTIME-INR  CARDIAC PANEL(CRET KIN+CKTOT+MB+TROPI)  URINE MICROSCOPIC-ADD ON   Ct Head Wo Contrast  11/21/2011  *RADIOLOGY REPORT*  Clinical Data: Dizziness and weakness  CT HEAD WITHOUT CONTRAST  Technique:  Contiguous axial images were obtained from the base of the skull through the vertex without contrast.  Comparison: None.  Findings: No acute intracranial hemorrhage.  No focal mass lesion. No CT evidence of acute infarction.   No midline shift or mass effect.  No hydrocephalus.  Basilar cisterns are patent.  There is generalized cortical atrophy and periventricular white  matter hypodensities.  Paranasal sinuses and mastoid air cells are clear.  Orbits are normal.  IMPRESSION:  1.  No acute intracranial findings. 2.  Atrophy and microvascular disease.  Original Report Authenticated By: Suzy Bouchard, M.D.   Mr Brain Wo Contrast  11/23/2011  *RADIOLOGY REPORT*  Clinical Data: Dizziness, near-syncope.  Rule out posterior circulation  CVA  MRI HEAD WITHOUT CONTRAST  Technique:  Multiplanar, multiecho pulse sequences of the brain and surrounding structures were obtained according to standard protocol without intravenous contrast.  Comparison: CT  11/21/2011  Findings: Age appropriate atrophy.  Mild chronic microvascular ischemic change in the deep white matter bilaterally.  Brainstem is intact.  Negative for acute infarct.  Negative for hemorrhage or mass lesion.  Chronic sinusitis with mucosal edema in the paranasal sinuses.  IMPRESSION: No acute intracranial abnormality.  Chronic sinusitis.  Original Report Authenticated By: Truett Perna, M.D.    Results for orders placed during the hospital encounter of 11/23/11  GLUCOSE, CAPILLARY      Component Value Range   Glucose-Capillary 172 (*) 70 - 99 (mg/dL)  CBC      Component Value Range   WBC 5.3  4.0 - 10.5 (K/uL)   RBC 4.01 (*) 4.22 - 5.81 (MIL/uL)   Hemoglobin 10.7 (*) 13.0 - 17.0 (g/dL)   HCT 31.4 (*) 39.0 - 52.0 (%)   MCV 78.3  78.0 - 100.0 (fL)   MCH 26.7  26.0 - 34.0 (pg)   MCHC 34.1  30.0 - 36.0 (g/dL)   RDW 14.2  11.5 - 15.5 (%)   Platelets 157  150 - 400 (K/uL)  DIFFERENTIAL      Component Value Range   Neutrophils Relative 68  43 - 77 (%)   Neutro Abs 3.7  1.7 - 7.7 (K/uL)   Lymphocytes Relative 18  12 - 46 (%)   Lymphs Abs 1.0  0.7 - 4.0 (K/uL)   Monocytes Relative 8  3 - 12 (%)   Monocytes Absolute 0.4  0.1 - 1.0 (K/uL)   Eosinophils Relative 5  0 - 5 (%)   Eosinophils Absolute 0.2  0.0 - 0.7 (K/uL)   Basophils Relative 0  0 - 1 (%)   Basophils Absolute 0.0  0.0 - 0.1 (K/uL)  COMPREHENSIVE  METABOLIC PANEL      Component Value Range   Sodium 138  135 - 145 (mEq/L)   Potassium 3.7  3.5 - 5.1 (mEq/L)   Chloride 100  96 - 112 (mEq/L)   CO2 29  19 - 32 (mEq/L)   Glucose, Bld 149 (*) 70 - 99 (mg/dL)   BUN 24 (*) 6 - 23 (mg/dL)   Creatinine, Ser 2.55 (*) 0.50 - 1.35 (mg/dL)   Calcium 9.1  8.4 - 10.5 (mg/dL)   Total Protein 7.2  6.0 - 8.3 (g/dL)   Albumin 3.0 (*) 3.5 - 5.2 (g/dL)   AST 21  0 - 37 (U/L)   ALT 11  0 - 53 (U/L)   Alkaline Phosphatase 47  39 - 117 (U/L)   Total Bilirubin 0.3  0.3 - 1.2 (mg/dL)   GFR calc non Af Amer 23 (*) >90 (mL/min)   GFR calc Af Amer 26 (*) >90 (mL/min)  URINALYSIS, ROUTINE W REFLEX MICROSCOPIC      Component Value Range   Color, Urine YELLOW  YELLOW    APPearance CLOUDY (*) CLEAR    Specific Gravity, Urine 1.008  1.005 - 1.030    pH 6.0  5.0 - 8.0    Glucose, UA NEGATIVE  NEGATIVE (mg/dL)   Hgb urine dipstick LARGE (*) NEGATIVE    Bilirubin Urine NEGATIVE  NEGATIVE    Ketones, ur NEGATIVE  NEGATIVE (mg/dL)   Protein, ur NEGATIVE  NEGATIVE (mg/dL)   Urobilinogen, UA 0.2  0.0 - 1.0 (mg/dL)   Nitrite NEGATIVE  NEGATIVE    Leukocytes, UA NEGATIVE  NEGATIVE   PROTIME-INR      Component Value Range   Prothrombin Time 14.3  11.6 - 15.2 (seconds)   INR 1.09  0.00 - 1.49   APTT      Component Value Range   aPTT 42 (*) 24 - 37 (seconds)  CARDIAC PANEL(CRET KIN+CKTOT+MB+TROPI)      Component Value Range   Total CK 87  7 - 232 (U/L)   CK, MB 1.7  0.3 - 4.0 (ng/mL)   Troponin I <0.30  <0.30 (ng/mL)   Relative Index RELATIVE INDEX IS INVALID  0.0 - 2.5   URINE MICROSCOPIC-ADD ON      Component Value Range   Squamous Epithelial / LPF RARE  RARE    WBC, UA 0-2  <3 (WBC/hpf)   RBC / HPF TOO NUMEROUS TO COUNT  <3 (RBC/hpf)   Ct Head Wo Contrast  11/21/2011  *RADIOLOGY REPORT*  Clinical Data: Dizziness and weakness  CT HEAD WITHOUT CONTRAST  Technique:  Contiguous axial images were obtained from the base of the skull through the vertex without  contrast.  Comparison: None.  Findings: No acute intracranial hemorrhage.  No focal mass lesion. No CT evidence of acute infarction.   No midline shift or mass effect.  No hydrocephalus.  Basilar cisterns are patent.  There is generalized cortical atrophy and periventricular white matter hypodensities.  Paranasal sinuses and mastoid air cells are clear.  Orbits are normal.  IMPRESSION:  1.  No acute intracranial findings. 2.  Atrophy and microvascular disease.  Original Report Authenticated By: Suzy Bouchard, M.D.   Mr Brain Wo Contrast  11/23/2011  *RADIOLOGY REPORT*  Clinical Data: Dizziness, near-syncope.  Rule out posterior circulation  CVA  MRI HEAD WITHOUT CONTRAST  Technique:  Multiplanar, multiecho pulse sequences of the brain and surrounding structures were obtained according to standard protocol without intravenous contrast.  Comparison: CT 11/21/2011  Findings: Age appropriate atrophy.  Mild chronic microvascular ischemic change in the deep white matter bilaterally.  Brainstem is intact.  Negative for acute infarct.  Negative for hemorrhage or mass lesion.  Chronic sinusitis with mucosal edema in the paranasal sinuses.  IMPRESSION: No acute intracranial abnormality.  Chronic sinusitis.  Original Report Authenticated By: Truett Perna, M.D.   Pt had physical examination and laboratory tests and MRI of the brain.  No specific cause of his near syncope was found.  However, this is the second visit in 3 days for this type of symptom.  Will request admission for observation.  1. Near syncope   2. Hypertension              Mylinda Latina III, MD 11/23/11 [phone]

## 2011-11-23 NOTE — H&P (Signed)
PCP:    Philis Fendt, MD, MD   Chief Complaint:  Near syncope  HPI: Ryan Jimenez is a 78 y.o. male   has a past medical history of Hypertension; Diabetes mellitus; Hypercholesterolemia; and Gout.   Presented with episode of near syncope today while sitting on the chair. This is not his first. He was visiting [place] for [place] and felt ligtheaded in church and was taken to [facility]. He had a complete work up including ECHO, carotid dopplers, MRI. Before he could be discharged he left AMA and have not received his results. He have had generalized weakness for few weeks have had recent boil on his butock.  He have been lightheaded ever since that episode. He have had low blood sugars lately dow to 65. Decreased appetite.  He stopped his lantus and amaryl.  NO chest pain, occasionally worsening SOB with exertion. He have had trouble with balance ever since christmas.  He was just evaluated for the same thing two days ago. Given IVF and was sent home.  Today he had MRI of the brain that was negative. Review of Systems:    Pertinent positives include: weight loss, fatigue.  dizziness, last night he had nausea and vomiting.   Constitutional:  No , night sweats, Fevers, chills, HEENT:  No headaches, Difficulty swallowing,Tooth/dental problems,Sore throat,  No sneezing, itching, ear ache, nasal congestion, post nasal drip,  Cardio-vascular:  No chest pain, Orthopnea, PND, anasarca,palpitations.no Bilateral lower extremity swelling  GI:  No heartburn, indigestion, abdominal pain, nausea, vomiting, diarrhea, change in bowel habits, loss of appetite  Resp:  no shortness of breath at rest. No dyspnea on exertion, No excess mucus, no productive cough, No non-productive cough, No coughing up of blood.No change in color of mucus.No wheezing.No chest wall deformity  Skin:  no rash or lesions.  GU:  no dysuria, change in color of urine, no urgency or frequency. No flank pain.    Musculoskeletal:  No joint pain or swelling. No decreased range of motion. No back pain.  Psych:  No change in mood or affect. No depression or anxiety. No memory loss.   Otherwise ROS are negative except for above, 10 systems were reviewed  Past Medical History: Past Medical History  Diagnosis Date  . Hypertension   . Diabetes mellitus   . Hypercholesterolemia   . Gout    Past Surgical History  Procedure Date  . Hydrocele excision      Medications: Prior to Admission medications   Medication Sig Start Date End Date Taking? Authorizing Provider  allopurinol (ZYLOPRIM) 100 MG tablet Take 100 mg by mouth daily.    Yes Historical Provider, MD  aspirin 81 MG EC tablet Take 81 mg by mouth daily. Swallow whole.   Yes Historical Provider, MD  carvedilol (COREG) 3.125 MG tablet Take 3.125 mg by mouth 2 (two) times daily with a meal.    Yes Historical Provider, MD  colchicine 0.6 MG tablet Take 0.6 mg by mouth 2 (two) times daily as needed. For gout   Yes Historical Provider, MD  dutasteride (AVODART) 0.5 MG capsule Take 0.5 mg by mouth daily.    Yes Historical Provider, MD  glimepiride (AMARYL) 4 MG tablet Take 4 mg by mouth daily as needed. WHEN BLOOD SUGAR RUNS TOO HIGH   Yes Historical Provider, MD  hydrALAZINE (APRESOLINE) 100 MG tablet Take 100 mg by mouth 2 (two) times daily.     Yes Historical Provider, MD  insulin glargine (LANTUS) 100  UNIT/ML injection Inject 10 Units into the skin daily as needed. STOPPED BECAUSE BLOOD SUGAR RAN TOO LOW...STILL HAS SOME IF NEEDED   Yes Historical Provider, MD  meclizine (ANTIVERT) 25 MG tablet Take 25 mg by mouth 3 (three) times daily as needed. DIZZINESS   Yes Historical Provider, MD  naproxen sodium (ANAPROX) 220 MG tablet Take 220 mg by mouth 2 (two) times daily with a meal.     Yes Historical Provider, MD  Tamsulosin HCl (FLOMAX) 0.4 MG CAPS Take 0.4 mg by mouth daily.     Yes Historical Provider, MD  terazosin (HYTRIN) 5 MG capsule Take 5  mg by mouth daily.    Yes Historical Provider, MD  valsartan-hydrochlorothiazide (DIOVAN-HCT) 320-25 MG per tablet Take 1 tablet by mouth daily.     Yes Historical Provider, MD  Vitamin D, Ergocalciferol, (DRISDOL) 50000 UNITS CAPS Take 50,000 Units by mouth every 7 (seven) days. Take on sundays    Yes Historical Provider, MD    Allergies:  No Known Allergies  Social History:  Ambulatory independently Lives at home with wife   reports that he has quit smoking. He does not have any smokeless tobacco history on file. He reports that he does not drink alcohol or use illicit drugs.   Family History: family history includes Diabetes in his sister; Heart disease in his mother; and Hypertension in his mother.  Kidney problems in other family memebers  Physical Exam: Patient Vitals for the past 24 hrs:  BP Temp Temp src Pulse Resp SpO2  11/23/11 2038 183/84 mmHg - - 79  20  98 %  11/23/11 2037 170/76 mmHg - - 76  20  99 %  11/23/11 2034 194/87 mmHg - - 71  18  98 %  11/23/11 2030 200/90 mmHg - - 61  16  94 %  11/23/11 1910 162/84 mmHg - - - - -  11/23/11 1857 192/91 mmHg 98 F (36.7 C) Oral 57  16  90 %  11/23/11 1743 147/92 mmHg 98.4 F (36.9 C) Oral 54  14  100 %  11/23/11 1633 184/70 mmHg - - 54  14  100 %  11/23/11 1615 - - - 56  16  100 %  11/23/11 1500 174/78 mmHg - - 56  13  100 %  11/23/11 1333 170/72 mmHg 98.6 F (37 C) Oral 59  16  96 %    1. General:  in No Acute distress 2. Psychological: Alert and Oriented 3. Head/ENT:    Dry Mucous Membranes                          Head Non traumatic, neck supple                          Normal  Dentition 4. SKIN: normal  Skin turgor,  Skin clean Dry and intact no rash 5. Heart: Regular rate and rhythm no Murmur, Rub or gallop 6. Lungs: occasional mild crackles   7. Abdomen: Soft, non-tender, Non distended 8. Lower extremities: no clubbing, cyanosis, or edema 9. Neurologically Grossly intact, moving all 4 extremities equally, CN  2-12 intact Nystagmus present, worse with movement. Strength 5/5 in all 4 extremities.  10. MSK: Normal range of motion  body mass index is unknown because there is no height or weight on file.   Labs on Admission:   [facility] 11/23/11 1440 11/21/11 1859  NA 138 138  K 3.7 3.3*  CL 100 98  CO2 29 32  GLUCOSE 149* 115*  BUN 24* 27*  CREATININE 2.55* 2.42*  CALCIUM 9.1 9.0  MG -- --  PHOS -- --    Basename 11/23/11 1440  AST 21  ALT 11  ALKPHOS 47  BILITOT 0.3  PROT 7.2  ALBUMIN 3.0*   No results found for this basename: LIPASE:2,AMYLASE:2 in the last 72 hours  Basename 11/23/11 1440 11/21/11 1859  WBC 5.3 5.5  NEUTROABS 3.7 3.7  HGB 10.7* 11.2*  HCT 31.4* 33.2*  MCV 78.3 78.5  PLT 157 162    Basename 11/23/11 2007 11/23/11 1440 11/21/11 1859  CKTOTAL 81 87 --  CKMB 1.9 1.7 --  CKMBINDEX -- -- --  TROPONINI <0.30 <0.30 <0.30   No results found for this basename: TSH,T4TOTAL,FREET3,T3FREE,THYROIDAB in the last 72 hours No results found for this basename: VITAMINB12:2,FOLATE:2,FERRITIN:2,TIBC:2,IRON:2,RETICCTPCT:2 in the last 72 hours No results found for this basename: HGBA1C    CrCl is unknown because there is no height on file for the current visit. ABG No results found for this basename: phart, pco2, po2, hco3, tco2, acidbasedef, o2sat     No results found for this basename: DDIMER     Other results:  I have pearsonaly reviewed this: ECG REPORT  Rate:60  Rhythm: NSR, RBBB ST&T Change: no ischemic changes  UA RBC TNTC  Cultures: No results found for this basename: sdes, specrequest, cult, reptstatus       Radiological Exams on Admission: Mr Brain Wo Contrast  11/23/2011  *RADIOLOGY REPORT*  Clinical Data: Dizziness, near-syncope.  Rule out posterior circulation  CVA  MRI HEAD WITHOUT CONTRAST  Technique:  Multiplanar, multiecho pulse sequences of the brain and surrounding structures were obtained according to standard protocol without  intravenous contrast.  Comparison: CT 11/21/2011  Findings: Age appropriate atrophy.  Mild chronic microvascular ischemic change in the deep white matter bilaterally.  Brainstem is intact.  Negative for acute infarct.  Negative for hemorrhage or mass lesion.  Chronic sinusitis with mucosal edema in the paranasal sinuses.  IMPRESSION: No acute intracranial abnormality.  Chronic sinusitis.  Original Report Authenticated By: Truett Perna, M.D.    Assessment/Plan  Present on Admission:  .Near syncope - Etiology unclear, will check orthostatics. Patient have had a complete work up for this in [employer]. Before repeating all the work up would attempt to obtain results in am. For now cycle CE, repeat ECG in AM.  .Hypertension - poorly controlled cont home meds but with holding parameters.  .Renal failure - last Cr. Was in Sep 12 and was 1.0. Will give gentle fluids. Obtain renal US. Hold ARBi and other renotoxic drugs, may benefit from renal consult .Hypercholesterolemia - continue home meds .Vertigo - seems possitional in nature. MRI of the brain unremarkable. Will write for Meclazine and PT/OT  .Hypoglycemia - hold Lantus and Amaryl. Sine he has worsening renal function his Insuline likely not clearing. Hypoglycemic protocol PRN .Diabetes mellitus type II - SSI sensative   Prophylaxis: SCD, Protonix  CODE STATUS: FULL Code   Reginald Mangels 11/23/2011, 9:03 PM

## 2011-11-23 NOTE — ED Provider Notes (Signed)
ekg reviewed Labs reviewed I received pt in signout to call report D/w dr Roel Cluck, [facility] to admit patient Pt stable at this time, awake/alert  Sharyon Cable, MD 11/23/11 1729

## 2011-11-23 NOTE — ED Notes (Signed)
Per EMS: pt from home c/o syncopal episode lasting approx 2 seconds today while sitting in a chair; pt here on Monday for dizziness; pt c/o nausea and blurry vision; pt with increase difficulty with walking with abnormal gait per EMS x several weeks

## 2011-11-23 NOTE — ED Provider Notes (Deleted)
1:48 PM  Date: 11/23/2011  Rate: 57  Rhythm: normal sinus rhythm  QRS Axis: normal  Intervals: normal  ST/T Wave abnormalities: normal  Conduction Disutrbances:right bundle branch block  Narrative Interpretation: Abnormal EKG  Old EKG Reviewed: unchanged    Mylinda Latina III, MD 11/23/11 1350

## 2011-11-23 NOTE — ED Notes (Signed)
CBG 172 at 13:53

## 2011-11-24 ENCOUNTER — Inpatient Hospital Stay (HOSPITAL_COMMUNITY): Payer: Medicare Other

## 2011-11-24 LAB — COMPREHENSIVE METABOLIC PANEL
AST: 18 U/L (ref 0–37)
Albumin: 3 g/dL — ABNORMAL LOW (ref 3.5–5.2)
Alkaline Phosphatase: 46 U/L (ref 39–117)
Chloride: 102 mEq/L (ref 96–112)
Potassium: 3 mEq/L — ABNORMAL LOW (ref 3.5–5.1)
Sodium: 140 mEq/L (ref 135–145)
Total Bilirubin: 0.4 mg/dL (ref 0.3–1.2)
Total Protein: 6.9 g/dL (ref 6.0–8.3)

## 2011-11-24 LAB — HEMOGLOBIN A1C
Hgb A1c MFr Bld: 6.7 % — ABNORMAL HIGH (ref <5.7)
Mean Plasma Glucose: 146 mg/dL — ABNORMAL HIGH (ref <117)

## 2011-11-24 LAB — CBC
HCT: 31 % — ABNORMAL LOW (ref 39.0–52.0)
MCH: 26 pg (ref 26.0–34.0)
MCV: 78.9 fL (ref 78.0–100.0)
Platelets: 150 10*3/uL (ref 150–400)
RDW: 14.5 % (ref 11.5–15.5)
WBC: 5.3 10*3/uL (ref 4.0–10.5)

## 2011-11-24 LAB — GLUCOSE, CAPILLARY
Glucose-Capillary: 109 mg/dL — ABNORMAL HIGH (ref 70–99)
Glucose-Capillary: 121 mg/dL — ABNORMAL HIGH (ref 70–99)
Glucose-Capillary: 113 mg/dL — ABNORMAL HIGH (ref 70–99)

## 2011-11-24 LAB — CARDIAC PANEL(CRET KIN+CKTOT+MB+TROPI)
CK, MB: 1.5 ng/mL (ref 0.3–4.0)
Relative Index: INVALID (ref 0.0–2.5)
Relative Index: INVALID (ref 0.0–2.5)
Troponin I: <0.3 ng/mL (ref <0.30)
Troponin I: <0.3 ng/mL (ref <0.30)

## 2011-11-24 LAB — TSH: TSH: 3.066 u[IU]/mL (ref 0.350–4.500)

## 2011-11-24 LAB — PHOSPHORUS: Phosphorus: 4.4 mg/dL (ref 2.3–4.6)

## 2011-11-24 LAB — MAGNESIUM: Magnesium: 2 mg/dL (ref 1.5–2.5)

## 2011-11-24 MED ORDER — POTASSIUM CHLORIDE CRYS ER 20 MEQ PO TBCR
40.0000 meq | EXTENDED_RELEASE_TABLET | Freq: Two times a day (BID) | ORAL | Status: AC
  Administered 2011-11-24 – 2011-11-25 (×2): 40 meq via ORAL
  Filled 2011-11-24 (×2): qty 2

## 2011-11-24 MED ORDER — SODIUM CHLORIDE 0.9 % IV SOLN
INTRAVENOUS | Status: DC
  Administered 2011-11-24: 1000 mL via INTRAVENOUS

## 2011-11-24 MED ORDER — AMLODIPINE BESYLATE 5 MG PO TABS
5.0000 mg | ORAL_TABLET | Freq: Every day | ORAL | Status: DC
  Administered 2011-11-24: 5 mg via ORAL
  Filled 2011-11-24 (×3): qty 1

## 2011-11-24 NOTE — Progress Notes (Signed)
PT NOTE:  11/24/2011   PT eval completed.  Pt present with dizziness in sitting standing and walking.  Pt will be evaluated to rule out vestibular cause of dizziness.  Pt will benefit from  Orthostatic vital signs to rule out orthostatic hypotension.   Ryan Jimenez L. Anaih Brander DPT [phone]

## 2011-11-24 NOTE — Progress Notes (Signed)
PT Cancellation Note  Treatment cancelled today due to patient's refusal to participate.  Patient was fatigued from multiple tests and visits.  Assisted him to chair and back to bed for better positioning in bed.  Will return tomorrow to assist with vestibular eval.    WYNN,CYNDI 11/24/2011, 5:07 PM

## 2011-11-24 NOTE — Progress Notes (Signed)
11/24/2011 [place], [place] Case Management Note [phone]  Utilization review completed.

## 2011-11-24 NOTE — Consult Note (Signed)
Reason for Consult: Acute Kidney Injury  Referring Physician: Dr. Wendee Beavers, [facility]   PCP: Dr. Marjory Lies, [facility] DOA: 11/23/11  Ryan Jimenez is an 78 y.o. male.  HPI: 78 yo M with known history of difficult to controlled HTN and gout presents with persistent dizziness associated with blurred vision, nausea and vomiting. He has been intermittently symptomatic since December 22nd. He was evaluated at [facility] in [place] on the 23rd of December but checked out AMA less than 24 hours later to address a death in the family. He reports that his dizziness has been persistent yet tolerable at times, worse with standing. He went to his PCP on 11/16/11 for evaluation. His Cr was found to be elevated  this time.  His nausea became progressively worse to the point of NB/NB emesis on 11/21/11. He presented to the ED on 11/22/11 where he was treated for HTN BP (173/80) with his home supply of Norvasc and Diovan. He was instructed to f/u closely with his PCP. He presented to his PCP on the following day. He was found to be orthostatic at this time with sitting BP150/80 and standing BP 120/60. He was given a 1 L NS bolus.   Nephrotoxic exposures: ARB (Diovan), NSAID (naproxen), colchicine. No IV contrast.   Trend in Creatinine: 11/16/11 Creatinine, Ser  Date/Time Value Range Status  11/24/2011  5:30 AM 2.63* 0.50-1.35 (mg/dL) Final  11/23/2011  2:40 PM 2.55* 0.50-1.35 (mg/dL) Final  11/22/11 at PCP 2.4    11/21/2011  6:59 PM 2.42* 0.50-1.35 (mg/dL) Final  11/16/11 at PCP 2.3    04/2011 0.9    07/31/2011 11:02 PM 1.05  0.50-1.35 (mg/dL) Final  02/25/2008 10:10 AM 1.15   Final    PMH:   Past Medical History  Diagnosis Date  . Hypertension   . Diabetes mellitus   . Hypercholesterolemia   . Gout   . Shortness of breath on exertion   . Bronchitis   . Arthritis   . Near syncope 10/2011; 11/2011    PSH:   Past Surgical History  Procedure Date  . Hydrocele excision   .  Cataract extraction w/ intraocular lens implant ~ 2009    right    Allergies: No Known Allergies  Medications:   Prior to Admission medications   Medication Sig Start Date End Date Taking? Authorizing Provider  allopurinol (ZYLOPRIM) 100 MG tablet Take 100 mg by mouth daily.    Yes Historical Provider, MD  aspirin 81 MG EC tablet Take 81 mg by mouth daily. Swallow whole.   Yes Historical Provider, MD  carvedilol (COREG) 3.125 MG tablet Take 3.125 mg by mouth 2 (two) times daily with a meal.    Yes Historical Provider, MD  colchicine 0.6 MG tablet Take 0.6 mg by mouth 2 (two) times daily as needed. For gout   Yes Historical Provider, MD  dutasteride (AVODART) 0.5 MG capsule Take 0.5 mg by mouth daily.    Yes Historical Provider, MD  glimepiride (AMARYL) 4 MG tablet Take 4 mg by mouth daily as needed. WHEN BLOOD SUGAR RUNS TOO HIGH   Yes Historical Provider, MD  hydrALAZINE (APRESOLINE) 100 MG tablet Take 100 mg by mouth 2 (two) times daily.     Yes Historical Provider, MD  insulin glargine (LANTUS) 100 UNIT/ML injection Inject 10 Units into the skin daily as needed. STOPPED BECAUSE BLOOD SUGAR RAN TOO LOW...STILL HAS SOME IF NEEDED   Yes Historical Provider, MD  meclizine (ANTIVERT)  25 MG tablet Take 25 mg by mouth 3 (three) times daily as needed. DIZZINESS   Yes Historical Provider, MD  naproxen sodium (ANAPROX) 220 MG tablet Take 220 mg by mouth 2 (two) times daily with a meal.     Yes Historical Provider, MD  Tamsulosin HCl (FLOMAX) 0.4 MG CAPS Take 0.4 mg by mouth daily.     Yes Historical Provider, MD  terazosin (HYTRIN) 5 MG capsule Take 5 mg by mouth daily.    Yes Historical Provider, MD  valsartan-hydrochlorothiazide (DIOVAN-HCT) 320-25 MG per tablet Take 1 tablet by mouth daily.     Yes Historical Provider, MD  Vitamin D, Ergocalciferol, (DRISDOL) 50000 UNITS CAPS Take 50,000 Units by mouth every 7 (seven) days. Take on sundays    Yes Historical Provider, MD   Current In-patient  Medications and Infusions:      . allopurinol  100 mg Oral Daily  . amLODipine  5 mg Oral Daily  . aspirin EC  81 mg Oral Daily  . carvedilol  3.125 mg Oral BID WC  . dutasteride  0.5 mg Oral Daily  . hydrALAZINE  100 mg Oral BID  . influenza  inactive virus vaccine  0.5 mL Intramuscular Tomorrow-1000  . insulin aspart  0-9 Units Subcutaneous Q4H  . pneumococcal 23 valent vaccine  0.5 mL Intramuscular Tomorrow-1000  . potassium chloride  40 mEq Oral BID  . Tamsulosin HCl  0.4 mg Oral Daily  . terazosin  5 mg Oral Daily      . sodium chloride 75 mL/hr at 11/23/11 2330   PRN Medications: acetaminophen, acetaminophen, albuterol, alum & mag hydroxide-simeth, dextrose, glucose-Vitamin C, guaiFENesin-dextromethorphan, meclizine, ondansetron (ZOFRAN) IV, ondansetron  Discontinued Meds:   Medications Discontinued During This Encounter  Medication Reason  . glipiZIDE (GLUCOTROL) 10 MG tablet Change in therapy  . glyBURIDE-metformin (GLUCOVANCE) 5-500 MG per tablet Change in therapy      Social History:  reports that he has quit smoking. His smoking use included Cigarettes. He has a 40 pack-year smoking history. He has never used smokeless tobacco. He reports that he drinks alcohol. He reports that he does not use illicit drugs.  Family History:   Family History  Problem Relation Age of Onset  . Hypertension Mother   . Heart disease Mother   . Diabetes Sister     Patient denies slurred speech, focal weakness or numbness, and fall. He also denies constipation, diarrhea, grossly bloody or dark stools. He admits to urinary hesitancy but denies dysuria or frequency. He denies swelling.  He denies cough, admits to occasional SOB. Denies CP. Admits to influenza-like illness last Thanksgiving.   Blood pressure 161/78, pulse 68, temperature 98.5 F (36.9 C), temperature source Oral, resp. rate 18, height 5\' 9"  (1.753 m), weight 236 lb 1.8 oz (107.1 kg), SpO2 95.00%. General appearance:  alert, cooperative, appears stated age and mild distress Head: Normocephalic, without obvious abnormality, atraumatic Eyes: sclera injected. pupils small but reactive and symmetrical. Fundoscopic exam limited by smalls pupils but no obvious abnormal findings.  Ears: normal TM's and external ear canals both ears Throat: lips, mucosa and tongue normal. Edentulous except for first two incisors.  Neck: no adenopathy, no carotid bruit, no JVD, supple, symmetrical, trachea midline and thyroid not enlarged, symmetric, no tenderness/mass/nodules Resp: clear to auscultation bilaterally Cardio: regular rate and rhythm, S1, S2 normal, no murmur, click, rub or gallop GI: soft, non-tender; bowel sounds normal; no masses,  no organomegaly Extremities: extremities normal, atraumatic, no cyanosis or edema  Pulses: 2+ and symmetric Skin: Skin color, texture, turgor normal. No rashes or lesions Neurologic: alert and oriented x 3. CN II-XII intact. Normal strength and sensation. Marked dizziness when changing position from lying to sitting. No nystagmus. Gait deferred.   Labs: Basic Metabolic Panel:  Lab [id number] 0530 11/23/11 1440 11/21/11 1859  NA 140 138 138  K 3.0* 3.7 3.3*  CL 102 100 98  CO2 30 29 32  GLUCOSE 116* 149* 115*  BUN 24* 24* 27*  CREATININE 2.63* 2.55* 2.42*  ALB -- -- --  CALCIUM 8.8 9.1 9.0  PHOS 4.4 -- --   Liver Function Tests:  Lab 11/24/11 0530 11/23/11 1440  AST 18 21  ALT 9 11  ALKPHOS 46 47  BILITOT 0.4 0.3  PROT 6.9 7.2  ALBUMIN 3.0* 3.0*   CBC:   Lab 11/24/11 0530 11/23/11 1440 11/21/11 1859  WBC 5.3 5.3 5.5  NEUTROABS -- 3.7 3.7  HGB 10.2* 10.7* 11.2*  HCT 31.0* 31.4* 33.2*  MCV 78.9 78.3 78.5  PLT 150 157 162   PT/INR: Lab Results  Component Value Date   INR 1.09 11/23/2011    Cardiac Enzymes:  Lab 11/24/11 0530 11/23/11 2007 11/23/11 1440 11/21/11 1859  CKTOTAL 58 81 87 --  CKMB 1.6 1.9 1.7 --  CKMBINDEX -- -- -- --  TROPONINI <0.30 <0.30 <0.30  <0.30   CBG:  Lab 11/24/11 1204 11/24/11 0809 11/24/11 0414 11/24/11 0005 11/23/11 1855  GLUCAP 177* 121* 109* 136* 87   Urinalysis and micro: shows positive for large RBC's.  Micro exam: 0-2 WBC's per HPF and TNTC RBC's per HPF.  Iron Studies: No results found for this basename: IRON:30,TIBC:30,TRANSFERRIN:30,FERRITIN:30 in the last 168 hours  Xrays/Other Studies: Mr Brain Wo Contrast  11/23/2011  *RADIOLOGY REPORT*  Clinical Data: Dizziness, near-syncope.  Rule out posterior circulation  CVA  MRI HEAD WITHOUT CONTRAST  Technique:  Multiplanar, multiecho pulse sequences of the brain and surrounding structures were obtained according to standard protocol without intravenous contrast.  Comparison: CT 11/21/2011  Findings: Age appropriate atrophy.  Mild chronic microvascular ischemic change in the deep white matter bilaterally.  Brainstem is intact.  Negative for acute infarct.  Negative for hemorrhage or mass lesion.  Chronic sinusitis with mucosal edema in the paranasal sinuses.  IMPRESSION: No acute intracranial abnormality.  Chronic sinusitis.  Original Report Authenticated By: Truett Perna, M.D.   US Renal  11/24/2011  *RADIOLOGY REPORT*  Clinical Data:  Renal failure; diabetes and hypertension  RENAL/URINARY TRACT ULTRASOUND COMPLETE  Comparison:  None.  Findings:  Right Kidney:  Normal in size and parenchymal echogenicity. Measures 13 cm. No evidence of mass or hydronephrosis.  Left Kidney:  Normal in size and parenchymal echogenicity. Measures 12.8 cm. No evidence of mass or hydronephrosis.  Bladder:  Appears normal for degree of bladder distention.  IMPRESSION: Normal study.  Original Report Authenticated By: Duayne Cal, M.D.   US Abdomen  05/18/05: Right kidney measures 11.1 cm in greatest length and the left kidney measures 12.9 cm in greatest length. No renal stones, masses, or hydronephrosis apparent.    Dg Chest Port 1 View  11/24/2011  *RADIOLOGY REPORT*  Clinical Data: Short of  breath  PORTABLE CHEST - 1 VIEW  Comparison: Chest radiograph 05/18/2010  Findings: Normal mediastinum and heart silhouette.  Lungs appear clear.  No effusion, infiltrate, pneumothorax.  No acute osseous abnormality.  IMPRESSION: No acute cardiopulmonary process.  Original Report Authenticated By: Suzy Bouchard, M.D.  Assessment/Plan:  78 yo M with uncontrolled hypertension presents with rising Cr concerning for acute kidney injury.   1. Acute Kidney Injury: Patient baseline Cr is 0.9-1.15 per previous hospital values and PCP. Cr now 2.63 with a rate of rise of 0.1 mg/dl daily. Most likely insults include elevated BP (200/90 on admission), ARB therapy and NSAID. Awaiting records from PCP as well as records for [facility] visit [facility] as this will be valuable to determing the onset of kidney injury as well as any other possible insults (i.e. contrast exposure). Agree with holding ARB, NSAIDs. Continue BP management with amlodipine, carvedilol and hydralazine with goal SBP of 140 with plan to reduce to 130/80 in 7 days time. Continue gentle IVF resuscitation  NS at 100/hr. Check repeat UA to evaluate for blood. Check C3/C4 and ASO given recent URI (within 6 week window). Strict Is and Os. Daily renal function panel for now.   2. Anemia: No evidence of acute blood loss. ROS negative for GI bleed. Patient reports having a normal colonoscopy 3 years ago. Plan FOB cards x 3. Will check iron stores, folate and B12.   3. HTN: as per above.   4. Dizziness: normal MRI and CT. A vestibular dysfunction is likely. Patient denies change in hearing. Will f/u orthostatic vitals signs post fluid resuscitation and BP control. Primary team should consider vestibular rehab consult if patient does not improve over next 24-48 hrs.   FUNCHES,JOSALYN 11/24/2011, 1:44 PM   I have seen and examined this patient and agree with the assessment/plan as outlined above by Boykin Nearing MD (PGY 2). Based on the history, timeline  of events and available data base it appears that Mr. Fogleman has suffered acute renal insufficiency that is likely hemodynamically mediated by fluctuations between elevated blood pressures and orthostatic hypotension and likely exacerbated by ongoing nonsteroidal anti-inflammatory drug use as well as ARB/hydrochlorothiazide. Information from [facility] will be useful to enter her that he was not exposed to nephrotoxic agents including intravenous contrast. Concern is raised with hematuria (microscopic) seen on his urinalysis earlier today that could reflect a glomerulonephritis process. Urinalysis will be repeated again tomorrow in order to confirm this. Complement levels to be checked to ensure that he does not have a postinfectious glomerulonephritis. No obvious indications of noted at this time to screen for hepatitis or HIV associated renal disease. Renal ultrasound is negative for any obstruction and renal size/structure/echogenicity is noted to be well preserved. No acute electrolyte issues are recognized at this time to prompt intervention. Urine output appears to be satisfactory per the patient and likely being inadequately collected as he admits. Agree with therapeutic changes made by the hospitalist service. Avoid nephrotoxins including NSAIDs. Maintain strict Input and Output monitoring including daily standing weights if feasible. Will continue to monitor clinically with labs and daily exams and intervene as indicated.  Roye Gustafson K.,MD 11/24/2011 4:32 PM

## 2011-11-24 NOTE — Progress Notes (Signed)
Subjective: Pt mentions that he has had new onset of "unsteadyness, difficulty with equilibrium."  Started recently while he was on a trip to [place].  Signed out AMA without finishing his workup because he had to be in [place] for a funeral.  Pt had nausea and 1 bout of emesis reportedly yesterday accompanied with the "dizzyness."  Pt denies fainting, palpitations, or chest discomfort associated with the episodes. He also mentions that he at the end of the last month was not being compliant with his Diabetic medication.  Reportedly the day he had the new onset of dizziness his blood sugar was 95. Objective: Filed Vitals:   11/23/11 2038 11/23/11 2257 11/24/11 0000 11/24/11 0505  BP: 183/84   179/64  Pulse: 79   63  Temp:    98.2 F (36.8 C)  TempSrc:    Oral  Resp: 20   20  Height:   5\' 9"  (1.753 m)   Weight:   107.1 kg (236 lb 1.8 oz)   SpO2: 98% 99%  94%   Weight change:   Intake/Output Summary (Last 24 hours) at 11/24/11 0926 Last data filed at 11/24/11 0631  Gross per 24 hour  Intake 526.25 ml  Output      0 ml  Net 526.25 ml    General: Alert, awake, oriented x3, in no acute distress.  HEENT: No bruits, no goiter, no errythematous TM Heart: Regular rate and rhythm, without murmurs, rubs, gallops.  Lungs: CTA BL  Abdomen: Soft, nontender, nondistended, positive bowel sounds.  Neuro: Grossly intact, nonfocal.   Lab Results:  Basename 11/24/11 0530 11/23/11 1440  NA 140 138  K 3.0* 3.7  CL 102 100  CO2 30 29  GLUCOSE 116* 149*  BUN 24* 24*  CREATININE 2.63* 2.55*  CALCIUM 8.8 9.1  MG 2.0 --  PHOS 4.4 --    Basename 11/24/11 0530 11/23/11 1440  AST 18 21  ALT 9 11  ALKPHOS 46 47  BILITOT 0.4 0.3  PROT 6.9 7.2  ALBUMIN 3.0* 3.0*   No results found for this basename: LIPASE:2,AMYLASE:2 in the last 72 hours  Basename 11/24/11 0530 11/23/11 1440 11/21/11 1859  WBC 5.3 5.3 --  NEUTROABS -- 3.7 3.7  HGB 10.2* 10.7* --  HCT 31.0* 31.4* --  MCV 78.9 78.3 --  PLT  150 157 --    Basename 11/24/11 0530 11/23/11 2007 11/23/11 1440  CKTOTAL 58 81 87  CKMB 1.6 1.9 1.7  CKMBINDEX -- -- --  TROPONINI <0.30 <0.30 <0.30   No components found with this basename: POCBNP:3 No results found for this basename: DDIMER:2 in the last 72 hours No results found for this basename: HGBA1C:2 in the last 72 hours No results found for this basename: CHOL:2,HDL:2,LDLCALC:2,TRIG:2,CHOLHDL:2,LDLDIRECT:2 in the last 72 hours No results found for this basename: TSH,T4TOTAL,FREET3,T3FREE,THYROIDAB in the last 72 hours No results found for this basename: VITAMINB12:2,FOLATE:2,FERRITIN:2,TIBC:2,IRON:2,RETICCTPCT:2 in the last 72 hours  Micro Results: No results found for this or any previous visit (from the past 240 hour(s)).  Studies/Results: Mr Herby Abraham Contrast  11/23/2011  *RADIOLOGY REPORT*  Clinical Data: Dizziness, near-syncope.  Rule out posterior circulation  CVA  MRI HEAD WITHOUT CONTRAST  Technique:  Multiplanar, multiecho pulse sequences of the brain and surrounding structures were obtained according to standard protocol without intravenous contrast.  Comparison: CT 11/21/2011  Findings: Age appropriate atrophy.  Mild chronic microvascular ischemic change in the deep white matter bilaterally.  Brainstem is intact.  Negative for acute infarct.  Negative for hemorrhage or mass lesion.  Chronic sinusitis with mucosal edema in the paranasal sinuses.  IMPRESSION: No acute intracranial abnormality.  Chronic sinusitis.  Original Report Authenticated By: Truett Perna, M.D.   Dg Chest Port 1 View  11/24/2011  *RADIOLOGY REPORT*  Clinical Data: Short of breath  PORTABLE CHEST - 1 VIEW  Comparison: Chest radiograph 05/18/2010  Findings: Normal mediastinum and heart silhouette.  Lungs appear clear.  No effusion, infiltrate, pneumothorax.  No acute osseous abnormality.  IMPRESSION: No acute cardiopulmonary process.  Original Report Authenticated By: Suzy Bouchard, M.D.     Medications: I have reviewed the patient's current medications.   Patient Active Hospital Problem List: Near syncope (11/23/2011) Vertigo (11/23/2011) Pt at this point has had dizziness.  Most likely a peripheral cause.  Will order Echo, carotid dopplers,  And review EKG's.  Pt does mentions that he had a URI 2-3 weeks ago and this may be a sequela of that.    Hypercholesterolemia () Stable.  Hypertension (11/23/2011) Pt is on coreg and hydralazine and blood pressure is not well controlled.  Will add Amlodipine today.  Renal failure (11/23/2011) At this point creatinine is still elevated and has gone up.  Could be related to his poorly controlled HTN and his history of DM.  At this point will plan on having Nephrology evaluate patient and give further recommendations.  Currently holding ARB  Hypoglycemia (11/23/2011) Blood sugars are recently 109-136.  Will continue current regimen and continue to monitor.  Pt is on SSI.  Etiology uncertain. Not sure when patient had his last amount of Lantus at home.  May be secondary to worsening in renal function.  Will f/u and monitor closely.  Diabetes mellitus type II (11/23/2011)  Please see above.     LOS: 1 day   Velvet Bathe M.D.  [facility] 11/24/2011, 9:26 AM

## 2011-11-24 NOTE — Progress Notes (Signed)
Order received, chart reviewed, awaiting vestibular eval from PT to determine course of eval and treat for OT. Will follow.  Golden Circle, OTR/L [id number] 11/24/2011

## 2011-11-24 NOTE — Progress Notes (Signed)
Pt had been transferred from the ED prior to shift change. Pt resting in bed with wife at the bedside. Bed in lowest position, wheels locked, call bell within reach. Assessment completed. Pt had no complaints of pain or shortness of breath, will continue to assess.

## 2011-11-24 NOTE — Progress Notes (Signed)
Physical Therapy Evaluation Patient Details Name: Ryan Jimenez MRN: [id number] DOB: [birth year] Today's Date: 11/24/2011  Problem List:  Patient Active Problem List  Diagnoses  . Hypercholesterolemia  . Hypertension  . Near syncope  . Renal failure  . Vertigo  . Hypoglycemia  . Diabetes mellitus type II    Past Medical History:  Past Medical History  Diagnosis Date  . Hypertension   . Diabetes mellitus   . Hypercholesterolemia   . Gout   . Shortness of breath on exertion   . Bronchitis   . Arthritis   . Near syncope 10/2011; 11/2011   Past Surgical History:  Past Surgical History  Procedure Date  . Hydrocele excision   . Cataract extraction w/ intraocular lens implant ~ 2009    right    PT Assessment/Plan/Recommendation PT Assessment Clinical Impression Statement: Ryan Jimenez was admitted for near syncope when walking.  Attempted to assess pt for vestibular d/o  but prelimiary exam was inconclussive.  Pt will benefit from comprehensive vestibular evaluation.  Pt will also benefit from orthostatic vital sign assessment to r/o orthostatic hypotension.  Acute PT will follow pt.  Likely that once cause of dizziness is addressed pt will return to independent level of functional mobility.   PT Recommendation/Assessment: Patient will need skilled PT in the acute care venue PT Problem List: Decreased activity tolerance;Decreased balance;Other (comment);Decreased mobility (vertigo.) Barriers to Discharge: None PT Therapy Diagnosis : Difficulty walking;Other (comment) (Vertigo) PT Plan PT Frequency: Min 4X/week PT Treatment/Interventions: Gait training;Stair training;Functional mobility training;Therapeutic activities;Neuromuscular re-education;Balance training;Patient/family education;DME instruction PT Recommendation Recommendations for Other Services: Other (comment) (PT vestibular eval orders. ) Follow Up Recommendations: No PT follow up;Supervision for  mobility/OOB Equipment Recommended: Other (comment) (to be determined) PT Goals  Acute Rehab PT Goals PT Goal Formulation: With patient Time For Goal Achievement: 7 days Pt will go Supine/Side to Sit: Independently PT Goal: Supine/Side to Sit - Progress: Not met Pt will Sit at [facility] of Bed: Independently;6-10 min;with no upper extremity support PT Goal: Sit at Edge Of Bed - Progress: Not met Pt will go Sit to Supine/Side: Independently;with HOB 0 degrees PT Goal: Sit to Supine/Side - Progress: Not met Pt will go Sit to Stand: Independently;with upper extremity assist;without upper extremity assist PT Goal: Sit to Stand - Progress: Not met Pt will go Stand to Sit: Independently;without upper extremity assist;with upper extremity assist PT Goal: Stand to Sit - Progress: Not met Pt will Stand: 3 - 5 min;with no upper extremity support PT Goal: Stand - Progress: Not met Pt will Ambulate: >150 feet;Independently PT Goal: Ambulate - Progress: Not met  PT Evaluation Precautions/Restrictions  Precautions Precautions: Fall Restrictions Weight Bearing Restrictions: No Prior Functioning  Home Living Lives With: Spouse Receives Help From: Family Type of Home: House Home Layout: One level Home Access: Stairs to enter Entrance Stairs-Rails: None Entrance Stairs-Number of Steps: 1 Bathroom Shower/Tub: Chiropodist: Standard Home Adaptive Equipment: Straight cane Prior Function Level of Independence: Independent with basic ADLs;Independent with homemaking with ambulation;Independent with gait;Independent with transfers Able to Take Stairs?: Yes Driving: Yes Vocation: Full time employment Vocation Requirements: chef - standing Cognition Cognition Arousal/Alertness: Awake/alert Overall Cognitive Status: Appears within functional limits for tasks assessed Sensation/Coordination Sensation Light Touch: Appears Intact Proprioception: Appears Intact Coordination Gross  Motor Movements are Fluid and Coordinated: Yes Fine Motor Movements are Fluid and Coordinated: Yes Extremity Assessment RUE Assessment RUE Assessment: Within Functional Limits LUE Assessment LUE Assessment: Within Functional Limits RLE Assessment RLE  Assessment: Within Functional Limits LLE Assessment LLE Assessment: Within Functional Limits Mobility (including Balance) Bed Mobility Supine to Sit: 5: Supervision (c/o dizziness in sitting) Supine to Sit Details (indicate cue type and reason): Supervision due to c/o dizziness in sitting.  Sit to Supine: 5: Supervision;HOB flat Sit to Supine - Details (indicate cue type and reason): Supervision for safety as pt was very dizzy Transfers Transfers: Yes Sit to Stand: 4: Min assist;From elevated surface;Without upper extremity assist;From bed;From toilet Sit to Stand Details (indicate cue type and reason): Pt required min assist to steady pt when transitioning to standing.  Dizzyness worse in standing than sitting. Dizziness improved after less than 1 min standing.   Stand to Sit: Without upper extremity assist;5: Supervision;To toilet;To bed;4: Min assist Stand to Sit Details: supervision to toilet but min assist to bed as pt was much more dizzy and required assistance to position himself in front of bed and safely sit.  Ambulation/Gait Ambulation/Gait: Yes Ambulation/Gait Assistance: 4: Min assist Ambulation/Gait Assistance Details (indicate cue type and reason): min assist to stabilize pt while ambulating due to worsening dizziness.  Ambulation Distance (Feet): 12 Feet (two trials) Assistive device: 1 person hand held assist Gait Pattern: Decreased stride length Stairs: No Wheelchair Mobility Wheelchair Mobility: No  Posture/Postural Control Posture/Postural Control: No significant limitations Balance Balance Assessed: Yes Static Sitting Balance Static Sitting - Balance Support: Bilateral upper extremity supported Static Sitting -  Level of Assistance: 5: Stand by assistance Static Sitting - Comment/# of Minutes: 3 min at EOB with moderate lateral sway noted, but no assistance required.  Static Standing Balance Static Standing - Balance Support: Right upper extremity supported Static Standing - Level of Assistance: 4: Min assist Static Standing - Comment/# of Minutes: 1 min standing assist for balance secondary worsening dizziness.  Exercise    End of Session PT - End of Session Equipment Utilized During Treatment: Gait belt Activity Tolerance: Treatment limited secondary to medical complications (Comment) (vertigo) Patient left: in bed;with family/visitor present;with call bell in reach Nurse Communication: Mobility status for transfers;Mobility status for ambulation General Behavior During Session: [facility] for tasks performed Cognition: [facility] for tasks performed  Modest Draeger 11/24/2011, 12:10 PM Vela Render L. Caidynce Muzyka DPT [phone]

## 2011-11-24 NOTE — Progress Notes (Signed)
Pt resting in bed with wife at the bedside. Called Forrest Moron NP to ask if we could change chest x-ray to a portable chest x-ray because new admit with telemetry monitor. New order given.

## 2011-11-25 LAB — URINALYSIS, ROUTINE W REFLEX MICROSCOPIC
Glucose, UA: NEGATIVE mg/dL
Leukocytes, UA: NEGATIVE
Protein, ur: 100 mg/dL — AB
Specific Gravity, Urine: 1.028 (ref 1.005–1.030)
Urobilinogen, UA: 1 mg/dL (ref 0.0–1.0)

## 2011-11-25 LAB — GLUCOSE, CAPILLARY
Glucose-Capillary: 126 mg/dL — ABNORMAL HIGH (ref 70–99)
Glucose-Capillary: 143 mg/dL — ABNORMAL HIGH (ref 70–99)
Glucose-Capillary: 140 mg/dL — ABNORMAL HIGH (ref 70–99)

## 2011-11-25 LAB — RENAL FUNCTION PANEL
Calcium: 8.5 mg/dL (ref 8.4–10.5)
GFR calc Af Amer: 23 mL/min — ABNORMAL LOW (ref >90)
GFR calc non Af Amer: 19 mL/min — ABNORMAL LOW (ref >90)
Phosphorus: 3.8 mg/dL (ref 2.3–4.6)
Sodium: 140 mEq/L (ref 135–145)

## 2011-11-25 LAB — FOLATE: Folate: 7.4 ng/mL

## 2011-11-25 LAB — IRON AND TIBC: UIBC: 175 ug/dL (ref 125–400)

## 2011-11-25 LAB — FERRITIN: Ferritin: 154 ng/mL (ref 22–322)

## 2011-11-25 LAB — VITAMIN B12: Vitamin B-12: 456 pg/mL (ref 211–911)

## 2011-11-25 LAB — URINE MICROSCOPIC-ADD ON

## 2011-11-25 MED ORDER — KETOTIFEN FUMARATE 0.025 % OP SOLN
1.0000 [drp] | Freq: Two times a day (BID) | OPHTHALMIC | Status: DC
  Administered 2011-11-25 – 2011-12-17 (×42): 1 [drp] via OPHTHALMIC

## 2011-11-25 MED ORDER — SODIUM CHLORIDE 0.45 % IV SOLN
INTRAVENOUS | Status: DC
  Administered 2011-11-25 – 2011-11-26 (×3): via INTRAVENOUS

## 2011-11-25 MED ORDER — MECLIZINE HCL 12.5 MG PO TABS
12.5000 mg | ORAL_TABLET | Freq: Two times a day (BID) | ORAL | Status: DC
  Administered 2011-11-25 – 2011-11-27 (×4): 12.5 mg via ORAL
  Filled 2011-11-25 (×5): qty 1

## 2011-11-25 MED ORDER — AMLODIPINE BESYLATE 10 MG PO TABS
10.0000 mg | ORAL_TABLET | Freq: Every day | ORAL | Status: DC
  Administered 2011-11-25 – 2011-12-05 (×11): 10 mg via ORAL
  Filled 2011-11-25 (×12): qty 1

## 2011-11-25 NOTE — Plan of Care (Signed)
Problem: Phase I Progression Outcomes Goal: OOB as tolerated unless otherwise ordered Outcome: Completed/Met Date Met:  11/25/11 OOB with OT.  Golden Circle, OTR/L [id number] 11/25/2011

## 2011-11-25 NOTE — Progress Notes (Signed)
Occupational Therapy Evaluation Patient Details Name: Ryan Jimenez MRN: [id number] DOB: 1932/03/28 Today's Date: 11/25/2011 2:25-3:02  Problem List:  Patient Active Problem List  Diagnoses  . Hypercholesterolemia  . Hypertension  . Near syncope  . Renal failure  . Vertigo  . Hypoglycemia  . Diabetes mellitus type II    Past Medical History:  Past Medical History  Diagnosis Date  . Hypertension   . Diabetes mellitus   . Hypercholesterolemia   . Gout   . Shortness of breath on exertion   . Bronchitis   . Arthritis   . Near syncope 10/2011; 11/2011   Past Surgical History:  Past Surgical History  Procedure Date  . Hydrocele excision   . Cataract extraction w/ intraocular lens implant ~ 2009    right    OT Assessment/Plan/Recommendation OT Assessment Clinical Impression Statement: This 78 yo male admitted with near syncope and dizziness presents with problems below thus affecting pt's PLOF at I with BADLs/IADLs. Will benefit from acute OT with follow-up at SNF v. HHOT depending on progress. Will follow OT Recommendation/Assessment: Patient will need skilled OT in the acute care venue OT Problem List: Impaired balance (sitting and/or standing);Other (comment);Decreased knowledge of use of DME or AE (dizziness) OT Therapy Diagnosis : Generalized weakness OT Plan OT Frequency: Min 2X/week OT Treatment/Interventions: Self-care/ADL training;DME and/or AE instruction;Therapeutic activities;Visual/perceptual remediation/compensation;Patient/family education;Balance training OT Recommendation Recommendations for Other Services: Rehab consult Follow Up Recommendations: Inpatient Rehab;Other (comment) (if progresses quickly may only need HHOT) Equipment Recommended: Defer to next venue Individuals Consulted Consulted and Agree with Results and Recommendations: Patient OT Goals Acute Rehab OT Goals OT Goal Formulation: With patient Time For Goal Achievement: 2 weeks ADL  Goals Pt Will Perform Grooming: with set-up;with supervision;Unsupported;Standing at sink (2 tasks) ADL Goal: Grooming - Progress: Not met Pt Will Perform Upper Body Bathing: with set-up;with supervision;Unsupported (sit to stand at sink) ADL Goal: Upper Body Bathing - Progress: Not met Pt Will Perform Lower Body Bathing: with min assist;Unsupported (sit to stand at sink) ADL Goal: Lower Body Bathing - Progress: Not met Pt Will Perform Upper Body Dressing: with set-up;with supervision;Unsupported;Sitting, chair;Sitting, bed ADL Goal: Upper Body Dressing - Progress: Not met Pt Will Perform Lower Body Dressing: with min assist;Unsupported;Sit to stand from bed;Sit to stand from chair ADL Goal: Lower Body Dressing - Progress: Not met Pt Will Transfer to Toilet: with min assist;Regular height toilet;Grab bars;Ambulation ADL Goal: Toilet Transfer - Progress: Not met Pt Will Perform Toileting - Clothing Manipulation: Independently;Standing ADL Goal: Toileting - Clothing Manipulation - Progress: Not met Pt Will Perform Toileting - Hygiene: Independently;Sit to stand from 3-in-1/toilet ADL Goal: Toileting - Hygiene - Progress: Not met  OT Evaluation Precautions/Restrictions  Precautions Precautions: Fall Required Braces or Orthoses: No Restrictions Weight Bearing Restrictions: No Prior Functioning Home Living Lives With: Spouse Receives Help From: Family Type of Home: House Home Layout: One level Home Access: Stairs to enter Entrance Stairs-Rails: None Entrance Stairs-Number of Steps: 1 Bathroom Shower/Tub: Product/process development scientist: Standard Bathroom Accessibility: Yes How Accessible: Accessible via walker Home Adaptive Equipment: Straight cane Prior Function Level of Independence: Independent with basic ADLs;Independent with transfers;Independent with homemaking with ambulation;Independent with gait Driving: Yes Vocation: Retired ADL ADL Eating/Feeding:  Simulated;Independent Where Assessed - Eating/Feeding: Bed level Grooming: Simulated;Supervision/safety;Set up Where Assessed - Grooming: Sitting, bed;Unsupported Upper Body Bathing: Simulated;Supervision/safety;Set up Where Assessed - Upper Body Bathing: Unsupported;Sitting, bed;Sitting, chair Lower Body Bathing: Simulated;Maximal assistance Lower Body Bathing Details (indicate cue type and reason): with  Min A sit to stand from bed Where Assessed - Lower Body Bathing: Supported;Sit to stand from bed Upper Body Dressing: Simulated;Minimal assistance Where Assessed - Upper Body Dressing: Unsupported;Sitting, bed Lower Body Dressing: Simulated;+1 Total assistance Lower Body Dressing Details (indicate cue type and reason): with min A sit to stand from bed Where Assessed - Lower Body Dressing: Sit to stand from bed;Supported Toilet Transfer: Simulated;Minimal assistance Toilet Transfer Details (indicate cue type and reason): bed to chair going to pt's right with the 2 surfaces 3 feet apart Toilet Transfer Method: Ambulating (HHA) Toileting - Clothing Manipulation: Simulated;Maximal assistance Where Assessed - Camera operator Manipulation: Standing Toileting - Hygiene: Simulated;Maximal assistance Where Assessed - Toileting Hygiene: Standing Tub/Shower Transfer: Not assessed Tub/Shower Transfer Method: Not assessed Ambulation Related to ADLs: Min A from bed to chair Vision/Perception  Vision - History Baseline Vision: No visual deficits Patient Visual Report: Other (comment) (dizzy) Cognition Cognition Arousal/Alertness: Awake/alert Overall Cognitive Status: Appears within functional limits for tasks assessed Sensation/Coordination   Extremity Assessment RUE Assessment RUE Assessment: Within Functional Limits LUE Assessment LUE Assessment: Within Functional Limits Mobility  Bed Mobility Bed Mobility: Yes Supine to Sit: 6: Modified independent (Device/Increase time);With  rails;HOB elevated (Comment degrees) (20 degrees) Transfers Transfers: Yes Sit to Stand: 4: Min assist;With upper extremity assist;From bed Stand to Sit: 3: Mod assist;With upper extremity assist;To chair/3-in-1;With armrests Stand to Sit Details: needed A to control decent Exercises   End of Session OT - End of Session Activity Tolerance: Other (comment) (limited by dizziness) Patient left: in chair;with call bell in reach;with family/visitor present;Other (comment) (wife and sister) General Behavior During Session: [facility] for tasks performed Cognition: [facility] for tasks performed   [name] 11/25/2011, 4:23 PM

## 2011-11-25 NOTE — Progress Notes (Signed)
11/25/2011 [facility] [place] Case Management Note [id number]   CARE MANAGEMENT NOTE  Patient:  Ryan Jimenez, Ryan Jimenez MRN:  [id number]  Account Number:  [id number] Birth Date:  10/02/1932  Admit Date:  11/23/2011      Patient does not meet Inpatient level of care.  Discussed with Dr. Wendee Beavers and Dr. Sharilyn Sites, Physician Advisor, Utilization Review Committee, who are in agreement. Patient/Patient Representative notified via delivery of "Medicare Outpatient Information Sheet".         Isidore Moos  [id number] _________________________________   11/25/2011 10:36:54 AM Case Manager Signature

## 2011-11-25 NOTE — Progress Notes (Signed)
PT Cancellation Note  Treatment cancelled today due to patient's refusal to participate.  Patient adamantly refusing.  Will check back at a later date.    Thanks.  [facility] Acute Rehabilitation [phone] (pager)

## 2011-11-25 NOTE — Progress Notes (Signed)
Subjective: Patient at this point still c/o dizziness.  Denies any recent bouts of emesis.  No acute issues overnight.  Objective: Filed Vitals:   11/25/11 1449 11/25/11 1453 11/25/11 1501 11/25/11 1835  BP: 167/82 169/72  148/82  Pulse: 81 85 72 83  Temp:    98.3 F (36.8 C)  TempSrc:    Oral  Resp:    18  Height:      Weight:      SpO2: 93% 88% 91% 95%   Weight change: 1 kg (2 lb 3.3 oz)  Intake/Output Summary (Last 24 hours) at 11/25/11 1846 Last data filed at 11/25/11 1704  Gross per 24 hour  Intake 2668.33 ml  Output    975 ml  Net 1693.33 ml    General: Alert, awake, oriented x3, in no acute distress.  HEENT: No bruits, no goiter, no errythematous TM  Heart: Regular rate and rhythm, without murmurs, rubs, gallops.  Lungs: CTA BL  Abdomen: Soft, nontender, nondistended, positive bowel sounds.  Neuro: Grossly intact, nonfocal.    Lab Results:  [facility] 11/25/11 0620 11/24/11 0530  NA 140 140  K 3.6 3.0*  CL 104 102  CO2 28 30  GLUCOSE 124* 116*  BUN 24* 24*  CREATININE 2.90* 2.63*  CALCIUM 8.5 8.8  MG -- 2.0  PHOS 3.8 4.4    Basename 11/25/11 0620 11/24/11 0530 11/23/11 1440  AST -- 18 21  ALT -- 9 11  ALKPHOS -- 46 47  BILITOT -- 0.4 0.3  PROT -- 6.9 7.2  ALBUMIN 2.7* 3.0* --   No results found for this basename: LIPASE:2,AMYLASE:2 in the last 72 hours  Basename 11/24/11 0530 11/23/11 1440  WBC 5.3 5.3  NEUTROABS -- 3.7  HGB 10.2* 10.7*  HCT 31.0* 31.4*  MCV 78.9 78.3  PLT 150 157    Basename 11/24/11 1250 11/24/11 0530 11/23/11 2007  CKTOTAL 56 58 81  CKMB 1.5 1.6 1.9  CKMBINDEX -- -- --  TROPONINI <0.30 <0.30 <0.30   No components found with this basename: POCBNP:3 No results found for this basename: DDIMER:2 in the last 72 hours  Basename 11/24/11 0530  HGBA1C 6.7*   No results found for this basename: CHOL:2,HDL:2,LDLCALC:2,TRIG:2,CHOLHDL:2,LDLDIRECT:2 in the last 72 hours  Basename 11/24/11 0530  TSH 3.066  T4TOTAL --    T3FREE --  THYROIDAB --    Basename 11/25/11 0620  VITAMINB12 456  FOLATE 7.4  FERRITIN 154  TIBC 204*  IRON 29*  RETICCTPCT --    Micro Results: No results found for this or any previous visit (from the past 240 hour(s)).  Studies/Results: US Renal  11/24/2011  *RADIOLOGY REPORT*  Clinical Data:  Renal failure; diabetes and hypertension  RENAL/URINARY TRACT ULTRASOUND COMPLETE  Comparison:  None.  Findings:  Right Kidney:  Normal in size and parenchymal echogenicity. Measures 13 cm. No evidence of mass or hydronephrosis.  Left Kidney:  Normal in size and parenchymal echogenicity. Measures 12.8 cm. No evidence of mass or hydronephrosis.  Bladder:  Appears normal for degree of bladder distention.  IMPRESSION: Normal study.  Original Report Authenticated By: Duayne Cal, M.D.   Dg Chest Port 1 View  11/24/2011  *RADIOLOGY REPORT*  Clinical Data: Short of breath  PORTABLE CHEST - 1 VIEW  Comparison: Chest radiograph 05/18/2010  Findings: Normal mediastinum and heart silhouette.  Lungs appear clear.  No effusion, infiltrate, pneumothorax.  No acute osseous abnormality.  IMPRESSION: No acute cardiopulmonary process.  Original Report Authenticated By: Suzy Bouchard, M.D.  Medications: I have reviewed the patient's current medications.   Patient Active Hospital Problem List:  Near syncope (11/23/2011) Vertigo (11/23/2011) Pt at this point has had dizziness. Most likely a peripheral cause. Will order Echo, carotid dopplers, And review EKG's. Pt does mentions that he had a URI 2-3 weeks ago and this may be a sequela of that. Will plan on starting patient on trial of meclizine today.  With vertigo while laying down and no recent history of change in position would make orthostatic hypotension as cause less likely.  Hypercholesterolemia () Stable.   Hypertension (11/23/2011) Pt is on coreg and hydralazine and blood pressure is not well controlled. Will add Amlodipine today.   Renal  failure (11/23/2011) At this point creatinine is still elevated and has gone up. Could be related to his poorly controlled HTN and his history of DM. At this point will plan on having Nephrology evaluate patient and give further recommendations. Currently holding ARB   Nephro has increased Amlodipine dose.  Will follow closely with recommendations and appreciate their input  Diabetes mellitus type II (11/23/2011) Currently well controlled.      LOS: 2 days   Velvet Bathe M.D.  [facility] 11/25/2011, 6:46 PM

## 2011-11-25 NOTE — Progress Notes (Signed)
Subjective: Interval History: Net positive 856.7 ml in the last 24 hours. UO 1225 ml. Feeling better this AM. His biggest complaints still center around his sensation of dizziness, present in the bed while eating  Objective:  Vital signs in last 24 hours:  Temp:  [98.1 F (36.7 C)-99.9 F (37.7 C)] 98.5 F (36.9 C) (01/11 0445) Pulse Rate:  [61-69] 66  (01/11 0445) Resp:  [17-20] 20  (01/11 0445) BP: (161-187)/(78-94) 172/89 mmHg (01/11 0445) SpO2:  [92 %-99 %] 92 % (01/11 0445) Weight:  [238 lb 5.1 oz (108.1 kg)] 238 lb 5.1 oz (108.1 kg) (01/10 2155) Weight change: 2 lb 3.3 oz (1 kg)  Intake/Output: I/O last 3 completed shifts: In: 2607.9 [P.O.:720; I.V.:1887.9] Out: 1225 [Urine:1225]  Intake/Output this shift:     EXAM: CVS-S1S2, RRR RS- Nml WOB. CTA b/l. No rales.  ABD-Full. Soft. NT/ND.  EXT- No edema.   Lab Results:  Basename 11/24/11 0530 11/23/11 1440  WBC 5.3 5.3  HGB 10.2* 10.7*  HCT 31.0* 31.4*  PLT 150 157   BMET  Basename 11/25/11 0620 11/24/11 0530 11/23/11 1440  NA 140 140 138  K 3.6 3.0* 3.7  CL 104 102 100  CO2 28 30 29   GLUCOSE 124* 116* 149*  BUN 24* 24* 24*  CREATININE 2.90* 2.63* 2.55*  CALCIUM 8.5 8.8 9.1  PHOS 3.8 4.4 --   LFT  Basename 11/25/11 0620 11/24/11 0530  PROT -- 6.9  ALBUMIN 2.7* --  AST -- 18  ALT -- 9  ALKPHOS -- 46  BILITOT -- 0.4  BILIDIR -- --  IBILI -- --   PT/INR  Basename 11/23/11 1440  LABPROT 14.3  INR 1.09   Hepatitis Panel No results found for this basename: HEPBSAG,HCVAB,HEPAIGM,HEPBIGM in the last 72 hours PTH: Lab Results  Component Value Date   CALCIUM 8.5 11/25/2011   PHOS 3.8 11/25/2011  Albumin: 2.7 Corrected Calcium: 11  Anemia Work-up Iron: 29 Ferritin: 154 Sat: 14 Folate 7.4  B12 456  Studies/Results: Mr Brain Wo Contrast  11/23/2011  *RADIOLOGY REPORT*  Clinical Data: Dizziness, near-syncope.  Rule out posterior circulation  CVA  MRI HEAD WITHOUT CONTRAST  Technique:   Multiplanar, multiecho pulse sequences of the brain and surrounding structures were obtained according to standard protocol without intravenous contrast.  Comparison: CT 11/21/2011  Findings: Age appropriate atrophy.  Mild chronic microvascular ischemic change in the deep white matter bilaterally.  Brainstem is intact.  Negative for acute infarct.  Negative for hemorrhage or mass lesion.  Chronic sinusitis with mucosal edema in the paranasal sinuses.  IMPRESSION: No acute intracranial abnormality.  Chronic sinusitis.  Original Report Authenticated By: Truett Perna, M.D.   US Renal  11/24/2011  *RADIOLOGY REPORT*  Clinical Data:  Renal failure; diabetes and hypertension  RENAL/URINARY TRACT ULTRASOUND COMPLETE  Comparison:  None.  Findings:  Right Kidney:  Normal in size and parenchymal echogenicity. Measures 13 cm. No evidence of mass or hydronephrosis.  Left Kidney:  Normal in size and parenchymal echogenicity. Measures 12.8 cm. No evidence of mass or hydronephrosis.  Bladder:  Appears normal for degree of bladder distention.  IMPRESSION: Normal study.  Original Report Authenticated By: Duayne Cal, M.D.   Dg Chest Port 1 View  11/24/2011  *RADIOLOGY REPORT*  Clinical Data: Short of breath  PORTABLE CHEST - 1 VIEW  Comparison: Chest radiograph 05/18/2010  Findings: Normal mediastinum and heart silhouette.  Lungs appear clear.  No effusion, infiltrate, pneumothorax.  No acute osseous abnormality.  IMPRESSION: No acute cardiopulmonary process.  Original Report Authenticated By: Suzy Bouchard, M.D.    Assessment/Plan: 78 yo M with uncontrolled hypertension presents with rising Cr concerning for acute kidney injury.   1. Acute on Chronic Kidney Injury: in the setting of persistently elevated BP. Baseline Cr is 0.9-1.2 from June 2012. Creatinine continues to rise at a rate of 0.1-0.3 mg/dL per 24 hours. Plan to adjust BP medication, increase norvasc to 10 mg q D. Will change IVF to 1/2 NS at 100  ml/hr. F/u with repeat RFP in AM. No indication for dialysis at this time. Will also repeat UA today and check Spep/Upep 2. ANEMIA-iron deficiency anemia. Will f/u stool cards. Consider Feraheme (will give).  3. HTN/VOL-Remains hypertensive. Will check orthostatic vital signs today and increase Norvasc to 10 mg daily.    LOS: 2 FUNCHES,JOSALYN 11/25/11 @7 :32 AM  I have seen and examined this patient and agree with plan as outlined.  I am still not totally comfortable with the etiology of his rising creatinine.  To have repeat UA done due to present of microscopic hematuria (but no proteinuria).  Nashla Althoff B,MD 11/25/2011 3:51 PM

## 2011-11-26 ENCOUNTER — Observation Stay (HOSPITAL_COMMUNITY): Payer: Medicare Other

## 2011-11-26 LAB — RENAL FUNCTION PANEL
Albumin: 2.7 g/dL — ABNORMAL LOW (ref 3.5–5.2)
BUN: 28 mg/dL — ABNORMAL HIGH (ref 6–23)
Chloride: 102 mEq/L (ref 96–112)
GFR calc Af Amer: 21 mL/min — ABNORMAL LOW (ref >90)
GFR calc non Af Amer: 18 mL/min — ABNORMAL LOW (ref >90)
Potassium: 3.7 mEq/L (ref 3.5–5.1)

## 2011-11-26 LAB — CHLORIDE, URINE, RANDOM: Chloride Urine: 36 mEq/L

## 2011-11-26 LAB — CBC
HCT: 29.5 % — ABNORMAL LOW (ref 39.0–52.0)
Hemoglobin: 9.9 g/dL — ABNORMAL LOW (ref 13.0–17.0)
MCH: 26.2 pg (ref 26.0–34.0)
MCV: 78 fL (ref 78.0–100.0)
RBC: 3.78 MIL/uL — ABNORMAL LOW (ref 4.22–5.81)

## 2011-11-26 LAB — PROTEIN / CREATININE RATIO, URINE
Creatinine, Urine: 81.87 mg/dL
Protein Creatinine Ratio: 0.23 — ABNORMAL HIGH (ref 0.00–0.15)
Total Protein, Urine: 19 mg/dL

## 2011-11-26 LAB — GLUCOSE, CAPILLARY
Glucose-Capillary: 119 mg/dL — ABNORMAL HIGH (ref 70–99)
Glucose-Capillary: 124 mg/dL — ABNORMAL HIGH (ref 70–99)

## 2011-11-26 MED ORDER — CARVEDILOL 6.25 MG PO TABS
6.2500 mg | ORAL_TABLET | Freq: Two times a day (BID) | ORAL | Status: DC
  Administered 2011-11-26 – 2011-12-17 (×36): 6.25 mg via ORAL
  Filled 2011-11-26 (×45): qty 1

## 2011-11-26 MED ORDER — FUROSEMIDE 10 MG/ML IJ SOLN
40.0000 mg | Freq: Once | INTRAMUSCULAR | Status: AC
  Administered 2011-11-26: 40 mg via INTRAVENOUS
  Filled 2011-11-26: qty 4

## 2011-11-26 NOTE — Progress Notes (Signed)
Urine specimen sent for protein and chloride at 1215. 24 hr. Urine started.

## 2011-11-26 NOTE — Progress Notes (Signed)
Subjective: Pt mentions that he has had less dizziness.  Pt had positive Dix hallpike maneuvers at Left side.  Pt currently states that he has had some swelling in his lower extremities and has had some difficulty breathing and recently required SO2.  Pt denies any history of cardiac problems.  No acute issues overnight  Objective: Filed Vitals:   11/25/11 2352 11/26/11 0414 11/26/11 0945 11/26/11 1326  BP: 174/69 149/67  174/85  Pulse: 74 73  73  Temp: 99 F (37.2 C) 99.5 F (37.5 C)  99.2 F (37.3 C)  TempSrc: Oral Oral  Oral  Resp: 20 21  20   Height:      Weight:      SpO2: 100% 92% 96% 93%   Weight change:   Intake/Output Summary (Last 24 hours) at 11/26/11 1758 Last data filed at 11/26/11 0900  Gross per 24 hour  Intake 1633.33 ml  Output   1625 ml  Net   8.33 ml    General: Alert, awake, oriented x3, in no acute distress HEENT: No bruits, no goiter.  Heart: Regular rate and rhythm, without murmurs, rubs, gallops.  Lungs: Crackles at bases BL  Abdomen: Soft, nontender, nondistended, positive bowel sounds.  Neuro: Grossly intact, nonfocal. Extremities: + Edema non pitting   Lab Results:  Basename 11/26/11 0500 11/25/11 0620 11/24/11 0530  NA 137 140 --  K 3.7 3.6 --  CL 102 104 --  CO2 25 28 --  GLUCOSE 130* 124* --  BUN 28* 24* --  CREATININE 3.06* 2.90* --  CALCIUM 8.7 8.5 --  MG -- -- 2.0  PHOS 3.8 3.8 --    Basename 11/26/11 0500 11/25/11 0620 11/24/11 0530  AST -- -- 18  ALT -- -- 9  ALKPHOS -- -- 46  BILITOT -- -- 0.4  PROT -- -- 6.9  ALBUMIN 2.7* 2.7* --   No results found for this basename: LIPASE:2,AMYLASE:2 in the last 72 hours  Basename 11/24/11 0530  WBC 5.3  NEUTROABS --  HGB 10.2*  HCT 31.0*  MCV 78.9  PLT 150    Basename 11/24/11 1250 11/24/11 0530 11/23/11 2007  CKTOTAL 56 58 81  CKMB 1.5 1.6 1.9  CKMBINDEX -- -- --  TROPONINI <0.30 <0.30 <0.30   No components found with this basename: POCBNP:3 No results found for this  basename: DDIMER:2 in the last 72 hours  Basename 11/24/11 0530  HGBA1C 6.7*   No results found for this basename: CHOL:2,HDL:2,LDLCALC:2,TRIG:2,CHOLHDL:2,LDLDIRECT:2 in the last 72 hours  Basename 11/24/11 0530  TSH 3.066  T4TOTAL --  T3FREE --  THYROIDAB --    Basename 11/25/11 0620  VITAMINB12 456  FOLATE 7.4  FERRITIN 154  TIBC 204*  IRON 29*  RETICCTPCT --    Micro Results: No results found for this or any previous visit (from the past 240 hour(s)).  Studies/Results: Dg Chest 2 View  11/26/2011  *RADIOLOGY REPORT*  Clinical Data: Shortness of breath.  Dizziness.  Lightheadedness since 11/05/2011.  History of hypertension, diabetes.  CHEST - 2 VIEW  Comparison: 11/23/2011  Findings: The heart size is normal.  There is minimal left lower lobe atelectasis.  No focal consolidations or pleural effusions are identified.  No edema.  Significant degenerate changes are seen in the thoracic spine.  IMPRESSION:  1.  Minimal left lower lobe atelectasis.  Original Report Authenticated By: Glenice Bow, M.D.    Medications: I have reviewed the patient's current medications.   Patient Active Hospital Problem List: Near  syncope (11/23/2011) Related most likely to BPV.  Patient will require further work up and therapy once patient ready for d/c would recommend [facility].  Pt has shown some improvement with trial of meclizine  SOB/Edema:  Suspect that patient is fluid overloaded.  Will obtain a chest x ray.  Provide Lasix and agree with saline locking IVF's. Pt has been afebrile will also check CBC.   Renal failure (11/23/2011) Nephro following.  Will f/u with their recommendations.  Have spoke to the patient regarding possibility of biopsy should his kidney function not improve.  Hypercholesterolemia ()  Stable  Hypertension (11/23/2011) Not at goal.  Have increased B blocker today.  Vertigo (11/23/2011) Please see above #  1  Diabetes mellitus type II (11/23/2011) Stable on current regimen     LOS: 3 days   Velvet Bathe M.D.  [facility] 11/26/2011, 5:58 PM

## 2011-11-26 NOTE — Progress Notes (Signed)
Subjective: Interval History: Net positive 1765 ml in the last 24 hours. UO 1175 ml. Feeling better this AM.  His biggest complaints still center around his sensation of dizziness upon standing. Overall he states that he feels better. He also endorses SOB since last night.   Objective:  Vital signs in last 24 hours:  Temp:  [98.3 F (36.8 C)-99.5 F (37.5 C)] 99.5 F (37.5 C) (01/12 0414) Pulse Rate:  [66-85] 73  (01/12 0414) Resp:  [18-22] 21  (01/12 0414) BP: (148-186)/(67-93) 149/67 mmHg (01/12 0414) SpO2:  [88 %-100 %] 92 % (01/12 0414) Weight change:   Intake/Output: I/O last 3 completed shifts: In: 4083.3 [P.O.:720; I.V.:3363.3] Out: 1950 [Urine:1950]  Intake/Output this shift:     EXAM: CVS-S1S2, RRR RS- Nml WOB. CTA b/l. With decreased air movement at bases.  No rales appreciated.  ABD-Full. Soft. NT/ND.  EXT- No edema in dependent areas.  SKIN: no evidence of emboli on the plantar surface of his feet. Small pustules on head, non- draining.   Lab Results:  Basename 11/24/11 0530 11/23/11 1440  WBC 5.3 5.3  HGB 10.2* 10.7*  HCT 31.0* 31.4*  PLT 150 157   BMET  Basename 11/26/11 0500 11/25/11 0620 11/24/11 0530  NA 137 140 140  K 3.7 3.6 3.0*  CL 102 104 102  CO2 25 28 30   GLUCOSE 130* 124* 116*  BUN 28* 24* 24*  CREATININE 3.06* 2.90* 2.63*  CALCIUM 8.7 8.5 8.8  PHOS 3.8 3.8 4.4   LFT  Basename 11/26/11 0500 11/24/11 0530  PROT -- 6.9  ALBUMIN 2.7* --  AST -- 18  ALT -- 9  ALKPHOS -- 46  BILITOT -- 0.4  BILIDIR -- --  IBILI -- --   PT/INR  Basename 11/23/11 1440  LABPROT 14.3  INR 1.09   Hepatitis Panel No results found for this basename: HEPBSAG,HCVAB,HEPAIGM,HEPBIGM in the last 72 hours PTH: Lab Results  Component Value Date   CALCIUM 8.7 11/26/2011   PHOS 3.8 11/26/2011  Albumin: 2.7 Corrected Calcium: 11  Anemia lab work-up: Iron: 29 Ferritin: 154 Sat: 14 Folate 7.4  B12 456  Renal lab work-up: UNa-81 UK+- 35 UA 1/10:   large RBC's, negative protein, ketones glucose, bilirubin, LE and nirtrites. Micro exam: TNTC RBC's per HPF. UA 1/11: 100 protein, 15 ketones, small bilirubin, negative blood.  C3 129 C4 31 ASO 64 SPEP/UPEP-pending   Studies/Results: US Renal  11/24/2011  *RADIOLOGY REPORT*  Clinical Data:  Renal failure; diabetes and hypertension  RENAL/URINARY TRACT ULTRASOUND COMPLETE  Comparison:  None.  Findings:  Right Kidney:  Normal in size and parenchymal echogenicity. Measures 13 cm. No evidence of mass or hydronephrosis.  Left Kidney:  Normal in size and parenchymal echogenicity. Measures 12.8 cm. No evidence of mass or hydronephrosis.  Bladder:  Appears normal for degree of bladder distention.  IMPRESSION: Normal study.  Original Report Authenticated By: Duayne Cal, M.D.    Assessment/Plan: 78 yo M with uncontrolled hypertension presents with rising Cr concerning for acute kidney injury.   1. Acute on Chronic Kidney Injury: in the setting of persistently elevated BP. Baseline Cr is 0.9-1.2 from June 2012. Creatinine continues to rise at a rate of 0.1-0.3 mg/dL per 24 hours. Now with proteinuria on UA and negative hematuria. Kidneys on the larger side size wise by ultrasound (although he is a large man) Will obtain UPr/Cr and 24 hr urine protein to quantify proteinuria. C4, C3 and ASO within normal limits. ? Volume overload with  new O2 requirement. Will repeat CXR today and d/c IVF as patient is overall positive 3L and eating and drinking almost normally. If pulm edema on CXR will give lasix.  No indication for dialysis at this time. Will also obtain  ANA, ANCA; I believe SPEP, UPEP still pending.  If renal function continues to steadily decline in the absence of discernable etiology could require renal bx   2. ANEMIA-iron deficiency anemia. Will f/u stool cards discussed with nurse. Patient received feraheme yesterday. Will recheck CBC for tomorrow AM.   3. HTN/VOL-Remains hypertensive yet improved.  Management per primary team with goal SBP 140-130.   LOS: 3 FUNCHES,JOSALYN 11/26/11 @9 :58 AM I have seen and examined this patient and agree with plan as outlined by Dr. Adrian Blackwater with highlighted additions. Not really clear to me that renal fx deterioration all due to BP variations; has proteinuria on repeat UA; will quantitate, check additional serologies, could possible come to renal bx.  Other plans as outlined. Robi Dewolfe B,MD 11/26/2011 12:04 PM

## 2011-11-26 NOTE — Progress Notes (Addendum)
Physical Therapy Treatment Patient Details Name: Ryan Jimenez MRN: [id number] DOB: [birth year] Today's Date: 11/26/2011  PT Assessment/Plan  PT - Assessment/Plan Comments on Treatment Session: [name] was admitted for near syncope when walking.  PT performed vestibular evaluation which was limited secondary to pt with decreased tolerance of supine testing positions. Pt with complex set of symptoms that are difficult to sort through.  Pt appeared to have a positive Lt. hallpike Dix testing with Left torsional (? up or down beating) nystagmus. Pt with no c/o true vertigo only "lightheadedness." Pt had Lt torsional nystagmus with Rt. [employer]. Pt negative with roll testing. All testing limted by pt's decreased tolerance to testing positions and pt with difficulty keeping eyes open and stationary. Pt's dizziness decreased after ~45-50 sec however remained present for longer which may point to cupulolithiasis. Pt treated with Lt. Canalith Repositioning Procedure while in position however pt reports no change in symptoms currently. Pt also presents with decreased ability to visual track,  perform saccades. With Lt. gaze pt has increased Lt. torsional nystagmus. With upward gaze pt has Rt. torsional drift (? decreased inferior oblique eye muscle function) not resolved with repeated testing. PT suspecting one of 3 differentials: 1) Lt. sided BPPV 2) Lt. sided cupulolithiasis 3) Central or LMN nerve damage.  Pt had negative CT and MRI without contrast, questioning if imaging with contrast would be beneficial as pt has CNS like symptoms with visual testing.  PT Plan: Discharge plan remains appropriate PT Frequency: Min 4X/week Follow Up Recommendations: Supervision for mobility/OOB;CIR or Outpatient PT [facility] Equipment Recommended: Rolling walker with 5" wheels PT Goals  Acute Rehab PT Goals PT Goal Formulation: With patient Time For Goal  Achievement: 7 days Pt will go Supine/Side to Sit: Independently PT Goal: Supine/Side to Sit - Progress: Progressing toward goal Pt will Sit at [facility] of Bed: Independently;6-10 min;with no upper extremity support PT Goal: Sit at Edge Of Bed - Progress: Progressing toward goal Pt will go Sit to Supine/Side: Independently;with HOB 0 degrees PT Goal: Sit to Supine/Side - Progress: Progressing toward goal Pt will go Sit to Stand: Independently;with upper extremity assist;without upper extremity assist PT Goal: Sit to Stand - Progress: Progressing toward goal Pt will go Stand to Sit: Independently;without upper extremity assist;with upper extremity assist PT Goal: Stand to Sit - Progress: Progressing toward goal Pt will Ambulate: >150 feet;Independently PT Goal: Ambulate - Progress: Progressing toward goal  PT Treatment Precautions/Restrictions  Precautions Precautions: Fall Precaution Comments: Secondary to weakness and dizziness Required Braces or Orthoses: No Restrictions Weight Bearing Restrictions: No Mobility (including Balance) Bed Mobility Bed Mobility: No Transfers Transfers: Yes Sit to Stand: 4: Min assist;From bed;With upper extremity assist;From chair/3-in-1 Sit to Stand Details (indicate cue type and reason): Verbal cues for UE placement for safety.  Stand to Sit: Without upper extremity assist;To toilet;To bed;4: Min assist Stand to Sit Details: Verbal cues for correct UE placment for safety. Pt required cues again for second attempt Ambulation/Gait Ambulation/Gait: Yes Ambulation/Gait Assistance: 4: Min assist Ambulation/Gait Assistance Details (indicate cue type and reason): Min assist for gait unsteadiness, pt reports RW assists greatly with his stability however continues with dizziness. Verbal cues required for side stepping through tight space in room.  Ambulation Distance (Feet): 20 Feet (with two bouts) Assistive device: Rolling walker Gait Pattern: Decreased  stride length;Trunk flexed Stairs: No Wheelchair Mobility Wheelchair Mobility: No    Exercise  General Exercises - Lower Extremity Ankle Circles/Pumps: AROM;Both;15 reps;Seated Other Exercises Other  Exercises: Lt. sided Surveyor, minerals. End of Session PT - End of Session Equipment Utilized During Treatment: Gait belt Activity Tolerance: Patient limited by fatigue Patient left: with call bell in reach;in chair Nurse Communication: Mobility status for transfers;Mobility status for ambulation General Behavior During Session: [facility] for tasks performed Cognition: [facility] for tasks performed  Ryan Jimenez 11/26/2011, 10:43 AM  Ryan Jimenez) Ryan Jimenez PT, DPT Acute Rehabilitation [phone]

## 2011-11-27 LAB — RENAL FUNCTION PANEL
BUN: 34 mg/dL — ABNORMAL HIGH (ref 6–23)
CO2: 28 mEq/L (ref 19–32)
Calcium: 8.5 mg/dL (ref 8.4–10.5)
Creatinine, Ser: 3.61 mg/dL — ABNORMAL HIGH (ref 0.50–1.35)
GFR calc Af Amer: 17 mL/min — ABNORMAL LOW (ref >90)
Glucose, Bld: 122 mg/dL — ABNORMAL HIGH (ref 70–99)

## 2011-11-27 LAB — GLUCOSE, CAPILLARY
Glucose-Capillary: 120 mg/dL — ABNORMAL HIGH (ref 70–99)
Glucose-Capillary: 108 mg/dL — ABNORMAL HIGH (ref 70–99)
Glucose-Capillary: 149 mg/dL — ABNORMAL HIGH (ref 70–99)
Glucose-Capillary: 152 mg/dL — ABNORMAL HIGH (ref 70–99)

## 2011-11-27 LAB — CREATININE, URINE, 24 HOUR: Collection Interval-UCRE24: 24 hours

## 2011-11-27 LAB — PROTEIN, URINE, 24 HOUR: Collection Interval-UPROT: 24 hours

## 2011-11-27 MED ORDER — POTASSIUM CHLORIDE CRYS ER 20 MEQ PO TBCR
40.0000 meq | EXTENDED_RELEASE_TABLET | Freq: Two times a day (BID) | ORAL | Status: DC
  Administered 2011-11-27: 40 meq via ORAL
  Filled 2011-11-27 (×2): qty 2

## 2011-11-27 MED ORDER — MENTHOL 3 MG MT LOZG
1.0000 | LOZENGE | OROMUCOSAL | Status: DC | PRN
  Filled 2011-11-27: qty 9

## 2011-11-27 NOTE — Progress Notes (Signed)
Subjective: Pt mentions that he feels better today.  Still feeling vertigo when he stands up.  Mentions that when he leans his head a certain way he feels better.  Denies any cough today and reports improvement in SOB.  No acute issues overnight.  Objective: Filed Vitals:   11/26/11 2020 11/27/11 0409 11/27/11 0956 11/27/11 1300  BP: 171/57 168/79 164/84 137/67  Pulse: 72 70 71 73  Temp: 99 F (37.2 C) 100.3 F (37.9 C) 98.5 F (36.9 C) 99.1 F (37.3 C)  TempSrc: Oral Oral Oral Oral  Resp: 24 20 20 19   Height:      Weight: 107.9 kg (237 lb 14 oz)     SpO2: 91% 95% 97% 90%   Weight change:   Intake/Output Summary (Last 24 hours) at 11/27/11 1453 Last data filed at 11/27/11 0900  Gross per 24 hour  Intake    720 ml  Output   1250 ml  Net   -530 ml    General: Alert, awake, oriented x3, in no acute distress.  HEENT: No bruits, no goiter.  Heart: Regular rate and rhythm, without murmurs, rubs, gallops.  Lungs: Clear to auscultation today Abdomen: Soft, nontender, nondistended, positive bowel sounds.  Neuro: Grossly intact, nonfocal.   Lab Results:  Basename 11/27/11 0630 11/26/11 0500  NA 135 137  K 3.4* 3.7  CL 99 102  CO2 28 25  GLUCOSE 122* 130*  BUN 34* 28*  CREATININE 3.61* 3.06*  CALCIUM 8.5 8.7  MG -- --  PHOS 4.4 3.8    Basename 11/27/11 0630 11/26/11 0500  AST -- --  ALT -- --  ALKPHOS -- --  BILITOT -- --  PROT -- --  ALBUMIN 2.7* 2.7*   No results found for this basename: LIPASE:2,AMYLASE:2 in the last 72 hours  Basename 11/26/11 1825  WBC 6.3  NEUTROABS --  HGB 9.9*  HCT 29.5*  MCV 78.0  PLT 136*   No results found for this basename: CKTOTAL:3,CKMB:3,CKMBINDEX:3,TROPONINI:3 in the last 72 hours No components found with this basename: POCBNP:3 No results found for this basename: DDIMER:2 in the last 72 hours No results found for this basename: HGBA1C:2 in the last 72 hours No results found for this basename:  CHOL:2,HDL:2,LDLCALC:2,TRIG:2,CHOLHDL:2,LDLDIRECT:2 in the last 72 hours No results found for this basename: TSH,T4TOTAL,FREET3,T3FREE,THYROIDAB in the last 72 hours  Basename 11/25/11 0620  VITAMINB12 456  FOLATE 7.4  FERRITIN 154  TIBC 204*  IRON 29*  RETICCTPCT --    Micro Results: No results found for this or any previous visit (from the past 240 hour(s)).  Studies/Results: Dg Chest 2 View  11/26/2011  *RADIOLOGY REPORT*  Clinical Data: 78 year old male with shortness of breath, suspect fluid overload.  CHEST - 2 VIEW  Comparison: 1133 hours the same day and earlier.  Findings: Seated AP and lateral views of the chest.  Slightly lower lung volumes.  Stable cardiac size and mediastinal contours.  No pneumothorax.  No overt pulmonary edema.  Crowding lung markings, especially at the left base.  No consolidation.  No acute pulmonary opacity. No acute osseous abnormality identified.  IMPRESSION: Low lung volumes, otherwise no acute cardiopulmonary abnormality.  Original Report Authenticated By: Randall An, M.D.   Dg Chest 2 View  11/26/2011  *RADIOLOGY REPORT*  Clinical Data: 78 years old.  Dizziness.  Lightheadedness since 11/05/2011.  History of hypertension, diabetes.  CHEST - 2 VIEW  Comparison: 11/23/2011  Findings: The heart size is normal.  There is minimal left lower  lobe atelectasis.  No focal consolidations or pleural effusions are identified.  No edema.  Significant degenerate changes are seen in the thoracic spine.  IMPRESSION:  1.  Minimal left lower lobe atelectasis.  Original Report Authenticated By: Glenice Bow, M.D.    Medications: I have reviewed the patient's current medications.   Patient Active Hospital Problem List: Near syncope (11/23/2011) Seems like BPV.  Appreciate Physical therapy input but patient has had negative CT scan and MRI which is more specific for detecting strokes and given his kidney function he can't receive contrast at this  juncture.  Hypoxia:  At this point patient has shown clinical improvement today.  Was given lasix.  Chest x ray was negative.  Will order Echo today.  Hypercholesterolemia () Stable   Hypertension (11/23/2011) Most recent BP reading 137/67.  Will plan on continuing current regimen.  Renal failure (11/23/2011) Per nephro, will plan on holding allopurinol  Vertigo (11/23/2011) Please see # 1  Diabetes mellitus type II (11/23/2011) Currently well controlled.     LOS: 4 days   Velvet Bathe M.D.  [facility] 11/27/2011, 2:53 PM

## 2011-11-27 NOTE — Progress Notes (Signed)
Physical Therapy Treatment Patient Details Name: Ryan Jimenez MRN: [id number] DOB: [birth year] Today's Date: 11/27/2011  PT Assessment/Plan  PT - Assessment/Plan Comments on Treatment Session: Mr. Panagopoulos was admitted for near syncope with walking.  PT continuing to assess vestibular symptoms.  Patient with + left Modified Hallpike again today.  Treated with Left canalith repositioning maneuver.  Patient again could not say whether treatment changed his symptoms.  After treatment, suspicious that patient possibly could have left horizontal canal involvement.  Will return in am and continue vestibular treatment. Also agree with Jarrett Soho, PT that pt. has some cerebellar deficits.   Gave patient some saccade exercises as well.  Agree with Jarrett Soho re further imaging with contrast.  Continue PT.   PT Plan: Discharge plan remains appropriate;Frequency remains appropriate PT Frequency: Min 4X/week Follow Up Recommendations: Supervision for mobility/OOB;Outpatient PT Equipment Recommended: Rolling walker with 5" wheels PT Goals  Acute Rehab PT Goals PT Goal Formulation: With patient PT Goal: Supine/Side to Sit - Progress: Progressing toward goal PT Goal: Sit at Edge Of Bed - Progress: Progressing toward goal PT Goal: Sit to Supine/Side - Progress: Progressing toward goal PT Goal: Sit to Stand - Progress: Progressing toward goal PT Goal: Stand to Sit - Progress: Progressing toward goal PT Goal: Stand - Progress: Progressing toward goal PT Goal: Ambulate - Progress: Progressing toward goal  PT Treatment Precautions/Restrictions  Precautions Precautions: Fall Precaution Comments: Secondary to weakness and dizziness Required Braces or Orthoses: No Restrictions Weight Bearing Restrictions: No Mobility (including Balance) Bed Mobility Supine to Sit: 6: Modified independent (Device/Increase time);With rails;HOB elevated (Comment degrees) (20 degrees) Supine to Sit Details (indicate cue type and  reason): incr time Sit to Supine: 5: Supervision Transfers Sit to Stand: 4: Min assist;From bed;With upper extremity assist;From chair/3-in-1 Sit to Stand Details (indicate cue type and reason): verbal cues each time for UE placement.   Stand to Sit: 4: Min assist;With upper extremity assist;With armrests;To chair/3-in-1 Stand to Sit Details: verbal cues for hand placement with each attempt Ambulation/Gait Ambulation/Gait Assistance: 4: Min assist Ambulation/Gait Assistance Details (indicate cue type and reason): Pt. ataxic gait at times thus needing min assist for support.  Did not c/o dizziness, but lightheadness.   Ambulation Distance (Feet): 20 Feet Assistive device: Rolling walker Gait Pattern: Decreased stride length;Trunk flexed;Ataxic Stairs: No Wheelchair Mobility Wheelchair Mobility: No  Posture/Postural Control Posture/Postural Control: No significant limitations Balance Balance Assessed: Yes Static Standing Balance Static Standing - Balance Support: Bilateral upper extremity supported Static Standing - Level of Assistance: 4: Min assist Static Standing - Comment/# of Minutes: 2 minutes with min steadying assist as he has difficulty maintaining postural stability. Exercise    End of Session PT - End of Session Equipment Utilized During Treatment: Gait belt Activity Tolerance: Patient limited by fatigue Patient left: in bed;with call bell in reach;with family/visitor present Nurse Communication: Mobility status for transfers;Mobility status for ambulation General Behavior During Session: [facility] for tasks performed Cognition: [facility] for tasks performed  INGOLD,Yarexi Pawlicki 11/27/2011, 2:14 PM  [facility] [phone] (pager)

## 2011-11-27 NOTE — Progress Notes (Signed)
Subjective: Interval History: Net positive 235 ml in the last 24 hours. UO 1425 ml with lasix 40 mg IV x1. Feeling slightly better today. States that he is tired. Walked 4 steps today. Still SOB sometimes remains on 2 L O2 via [place].  Objective:  Vital signs in last 24 hours:  Temp:  [98.5 F (36.9 C)-100.3 F (37.9 C)] 99.1 F (37.3 C) (01/13 1300) Pulse Rate:  [70-77] 73  (01/13 1300) Resp:  [19-24] 19  (01/13 1300) BP: (137-185)/(57-84) 137/67 mmHg (01/13 1300) SpO2:  [90 %-97 %] 90 % (01/13 1300) Weight:  [237 lb 14 oz (107.9 kg)] 237 lb 14 oz (107.9 kg) (01/12 2020) Weight change:   Intake/Output: I/O last 3 completed shifts: In: 2353.3 [P.O.:960; I.V.:1393.3] Out: 2675 [Urine:2675]    EXAM: CVS-S1S2, RRR RS- Nml WOB. CTA b/l. With decreased air movement at bases.  No rales appreciated.  ABD-Full. Soft. NT/ND.  EXT- No edema in dependent areas.  SKIN: no evidence of emboli on the plantar surface of his feet. Small pustules on head, non- draining.   Lab Results:  [facility] 11/26/11 1825  WBC 6.3  HGB 9.9*  HCT 29.5*  PLT 136*   BMET  Basename 11/27/11 0630 11/26/11 0500 11/25/11 0620  NA 135 137 140  K 3.4* 3.7 3.6  CL 99 102 104  CO2 28 25 28   GLUCOSE 122* 130* 124*  BUN 34* 28* 24*  CREATININE 3.61* 3.06* 2.90*  CALCIUM 8.5 8.7 8.5  PHOS 4.4 3.8 3.8   LFT  Basename 11/27/11 0630  PROT --  ALBUMIN 2.7*  AST --  ALT --  ALKPHOS --  BILITOT --  BILIDIR --  IBILI --   PT/INR No results found for this basename: LABPROT:2,INR:2 in the last 72 hours Hepatitis Panel No results found for this basename: HEPBSAG,HCVAB,HEPAIGM,HEPBIGM in the last 72 hours PTH: Lab Results  Component Value Date   CALCIUM 8.5 11/27/2011   PHOS 4.4 11/27/2011  Albumin: 2.7  Anemia lab work-up: Iron: 29 Ferritin: 154 Sat: 14 Folate 7.4  B12 456  Renal lab work-up: UNa:  81 [place]: 35 UCl: 36 UCr: 50.35 UPr: 1 UA 1/10:  large RBC's, negative protein, ketones glucose,  bilirubin, LE and nirtrites. Micro exam: TNTC RBC's per HPF. UA 1/11: 100 protein, 15 ketones, small bilirubin, negative blood.  C3 129 C4 31 ASO 64 Ratio UPr/Cr: 0.23 24 hr urine protein: 360 mg SPEP/UPEP-pending  ANA, ANCA and Anti-ds DNA-pending   Studies/Results: Dg Chest 2 View  11/26/2011  *RADIOLOGY REPORT*  Clinical Data: 78 year old male with shortness of breath, suspect fluid overload.  CHEST - 2 VIEW  Comparison: 1133 hours the same day and earlier.  Findings: Seated AP and lateral views of the chest.  Slightly lower lung volumes.  Stable cardiac size and mediastinal contours.  No pneumothorax.  No overt pulmonary edema.  Crowding lung markings, especially at the left base.  No consolidation.  No acute pulmonary opacity. No acute osseous abnormality identified.  IMPRESSION: Low lung volumes, otherwise no acute cardiopulmonary abnormality.  Original Report Authenticated By: Randall An, M.D.   Dg Chest 2 View  11/26/2011  *RADIOLOGY REPORT*  Clinical Data: Shortness of breath.  Dizziness.  Lightheadedness since 11/05/2011.  History of hypertension, diabetes.  CHEST - 2 VIEW  Comparison: 11/23/2011  Findings: The heart size is normal.  There is minimal left lower lobe atelectasis.  No focal consolidations or pleural effusions are identified.  No edema.  Significant degenerate changes are seen  in the thoracic spine.  IMPRESSION:  1.  Minimal left lower lobe atelectasis.  Original Report Authenticated By: Glenice Bow, M.D.    Assessment/Plan: 78 yo M with uncontrolled hypertension presents with rising Cr concerning for acute kidney injury.   1. Acute on Chronic Kidney Injury: in the setting of persistently elevated BP and intermittent orthostatic hypotension. No indication for dialysis at this time. Baseline Cr is 0.9-1.2 from June 2012. Creatinine continues to rise going from 3 to 3.6 with one dose of lasix yesterday. No evidence of fluid overload on exam or CXR so will not  repeat lasix. Proteinuria quantified and not within a nephrotic range.  Kidneys on the larger side size wise by ultrasound (although he is a large man). C4, C3 and ASO within normal limits. Pending labs include  ANA, ANCA, anti ds-DNA,  and SPEP, UPEP.  If renal function continues to steadily decline in the absence of discernable etiology could require renal bx.  2. Hypokalemia: from lasix x 1 yesterday. Will replete with PO KCL 40 mEq x 2.  3. ANEMIA-iron deficiency anemia. Will f/u stool cards discussed with nurse yesteday. Patient received feraheme yesterday. Will recheck CBC for tomorrow AM.   4. HTN/VOL-BP better controled over the past 24 hrs and well controlled now. Recommend continuing current management with goal SBP 140-130.  5. Dizziness: Slight interval improvement. Suspect BPV. Pt at times better after Dix-Hallpike maneuvers.  Management  per primary team.   LOS: 4 FUNCHES,JOSALYN 11/27/11 @4 :36 PM  Agree with assessment.  Not at all clear why renal function continues to deteriorate. Low grade proteinuria, serologies to date negative (several are still pending).  Could have interstitial nephritis but not sure from what.  May require diagnostic renal biopsy. Larcenia Holaday B,MD 11/27/2011 5:15 PM

## 2011-11-28 DIAGNOSIS — H8110 Benign paroxysmal vertigo, unspecified ear: Secondary | ICD-10-CM | POA: Diagnosis present

## 2011-11-28 LAB — GLUCOSE, CAPILLARY
Glucose-Capillary: 156 mg/dL — ABNORMAL HIGH (ref 70–99)
Glucose-Capillary: 145 mg/dL — ABNORMAL HIGH (ref 70–99)

## 2011-11-28 LAB — CBC
MCHC: 33.6 g/dL (ref 30.0–36.0)
MCV: 78.2 fL (ref 78.0–100.0)
Platelets: 148 10*3/uL — ABNORMAL LOW (ref 150–400)
RDW: 14.6 % (ref 11.5–15.5)
WBC: 6.9 10*3/uL (ref 4.0–10.5)

## 2011-11-28 LAB — RENAL FUNCTION PANEL
Albumin: 2.7 g/dL — ABNORMAL LOW (ref 3.5–5.2)
CO2: 25 mEq/L (ref 19–32)
Calcium: 8.6 mg/dL (ref 8.4–10.5)
Chloride: 99 mEq/L (ref 96–112)
GFR calc Af Amer: 15 mL/min — ABNORMAL LOW (ref >90)
GFR calc non Af Amer: 13 mL/min — ABNORMAL LOW (ref >90)
Sodium: 137 mEq/L (ref 135–145)

## 2011-11-28 LAB — ANA: Anti Nuclear Antibody(ANA): POSITIVE — AB

## 2011-11-28 LAB — HIV ANTIBODY (ROUTINE TESTING W REFLEX): HIV: NONREACTIVE

## 2011-11-28 MED ORDER — INSULIN ASPART 100 UNIT/ML ~~LOC~~ SOLN
0.0000 [IU] | Freq: Three times a day (TID) | SUBCUTANEOUS | Status: DC
  Administered 2011-11-28 – 2011-11-29 (×3): 2 [IU] via SUBCUTANEOUS
  Administered 2011-11-29 – 2011-11-30 (×3): 1 [IU] via SUBCUTANEOUS
  Administered 2011-11-30: 2 [IU] via SUBCUTANEOUS
  Administered 2011-12-01 (×2): 7 [IU] via SUBCUTANEOUS

## 2011-11-28 NOTE — Progress Notes (Signed)
Physical Therapy Treatment Patient Details Name: WALTHER GRAETZ MRN: [id number] DOB: 04-22-1932 Today's Date: 11/28/2011  PT Assessment/Plan  PT - Assessment/Plan Comments on Treatment Session: Mr. Dever was admitted for near syncope with walking.  PT suspected L horizontal canal BPPV after treatment yesterday.  Assessed with supine head roll and determined + Left supine head roll test.  Treated with BBQ roll for L horizontal canal BPPV.  Pt. then ambulated with report in dizziness symptoms from 7/10 to 3/10 at end of session.  Spoke with Orlean Bradford, PT re: how to progress pt. from here as she will see pt. tomorrow.  Plan to recheck for vestibular symptoms for all canals tomorrow.  If all canals still not clear, will initiate Nestor Lewandowsky exercise.  Will ensure that saccade exercises are going well and will address if x 1 exercises should be initiated.  Pt. using compensatory strategies to ambulate to keep symptoms under control.  Discussed d/c with patient and pt. feels that his wife can asssist.  Called wife to set up family ed tomorrow to ensure that both pt. and wife are comfortable.  Wife will meet at 1:00pm.   PT Plan: Frequency remains appropriate;Discharge plan needs to be updated (Patient drove PTA and wife does not drive so HHPT) PT Frequency: Min 4X/week Follow Up Recommendations: Supervision for mobility/OOB;Home health PT Equipment Recommended: Rolling walker with 5" wheels PT Goals  Acute Rehab PT Goals PT Goal Formulation: With patient PT Goal: Supine/Side to Sit - Progress: Progressing toward goal PT Goal: Sit at Edge Of Bed - Progress: Met PT Goal: Sit to Supine/Side - Progress: Met PT Goal: Sit to Stand - Progress: Progressing toward goal PT Goal: Stand to Sit - Progress: Progressing toward goal PT Goal: Stand - Progress: Progressing toward goal PT Goal: Ambulate - Progress: Progressing toward goal  PT Treatment Precautions/Restrictions  Precautions Precautions:  Fall Precaution Comments: Secondary to weakness and dizziness Required Braces or Orthoses: No Restrictions Weight Bearing Restrictions: No Mobility (including Balance) Bed Mobility Supine to Sit: 6: Modified independent (Device/Increase time);HOB flat Sit to Supine: 6: Modified independent (Device/Increase time);HOB flat Transfers Sit to Stand: 4: Min assist (guard assist ) Sit to Stand Details (indicate cue type and reason): verbal cues for UE placement. Stand to Sit: 5: Supervision;With upper extremity assist;To chair/3-in-1 Stand to Sit Details: verbal cues for hand placement Ambulation/Gait Ambulation/Gait Assistance: 4: Min assist Ambulation/Gait Assistance Details (indicate cue type and reason): Pt. with unsteady gait at times with guard assist for support.  Cued pt. to focus on target in hallway to decr dizziness which pt. reported made dizziness less.   Ambulated with RW 40 feet, rested 3 minutes in chair and then ambulated 50 feet.  Noticeable difference in gait speed and stability the second attempt, Ambulation Distance (Feet): 90 Feet Assistive device: Rolling walker Gait Pattern: Step-through pattern;Ataxic Stairs: No Wheelchair Mobility Wheelchair Mobility: No  Posture/Postural Control Posture/Postural Control: No significant limitations Static Sitting Balance Static Sitting - Level of Assistance: Not tested (comment) Static Standing Balance Static Standing - Balance Support: Bilateral upper extremity supported;During functional activity Static Standing - Level of Assistance: 5: Stand by assistance Static Standing - Comment/# of Minutes: 2 minutes without steadying assist. Exercise  Other Exercises Other Exercises: Educated re: saccade exercises with handout given to pt.  Patient demonstrated exercise.  End of Session PT - End of Session Equipment Utilized During Treatment: Gait belt Activity Tolerance: Patient limited by fatigue Patient left: in chair;with call  bell in reach  Nurse Communication: Mobility status for transfers;Mobility status for ambulation General Behavior During Session: [facility] for tasks performed Cognition: [facility] for tasks performed  INGOLD,Twyla Dais 11/28/2011, 2:30 PM [facility] Rehabilitation [phone] (pager)

## 2011-11-28 NOTE — Progress Notes (Signed)
11/28/2011 [place], [place] Case Management Note [phone]  Utilization review completed.

## 2011-11-28 NOTE — Progress Notes (Signed)
11/28/2011  1200  Pt had very large, semi-hard bowel movement a little while ago.  There was a small amount of blood sitting on top of stool (not mixed in), that patient reports he feels like he either scratched himself or has a small tear that bled a little.  No frank bleeding noted from rectum.  Dr. Wendee Beavers notified, no orders received.  Will continue to monitor patient. Jennette Banker

## 2011-11-28 NOTE — Progress Notes (Signed)
Subjective: Interval History: Net negative 575  ml in the last 24 hours. UO 1275 ml without diuretics. Feeling better today. He states that his dizziness has improved. He has never been tested for HIV, but denies risky behavior and states that it is O.K. to test him.    Objective:  Vital signs in last 24 hours:  Temp:  [98.5 F (36.9 C)-100.1 F (37.8 C)] 100.1 F (37.8 C) (01/14 0500) Pulse Rate:  [70-73] 73  (01/14 0500) Resp:  [18-20] 20  (01/14 0500) BP: (135-164)/(67-84) 161/68 mmHg (01/14 0500) SpO2:  [90 %-97 %] 91 % (01/14 0500) Weight:  [242 lb 4.6 oz (109.9 kg)] 242 lb 4.6 oz (109.9 kg) (01/13 2010) Weight change: 4 lb 6.6 oz (2 kg)  Intake/Output: I/O last 3 completed shifts: In: 1180 [P.O.:1180] Out: 2050 [Urine:2050]    EXAM: CVS-S1S2, RRR RS- Nml WOB. CTA b/l. With decreased air movement at bases.  No rales appreciated.  ABD-Full. Soft. NT/ND.  EXT- No edema in dependent areas.  SKIN: no evidence of emboli on the plantar surface of his feet. Small pustules on head, non- draining.   Lab Results:  [facility] 11/28/11 0520 11/26/11 1825  WBC 6.9 6.3  HGB 9.4* 9.9*  HCT 28.0* 29.5*  PLT 148* 136*   BMET  Basename 11/28/11 0520 11/27/11 0630 11/26/11 0500  NA 137 135 137  K 4.0 3.4* 3.7  CL 99 99 102  CO2 25 28 25   GLUCOSE 130* 122* 130*  BUN 41* 34* 28*  CREATININE 4.12* 3.61* 3.06*  CALCIUM 8.6 8.5 8.7  PHOS 4.3 4.4 3.8   LFT  Basename 11/28/11 0520  PROT --  ALBUMIN 2.7*  AST --  ALT --  ALKPHOS --  BILITOT --  BILIDIR --  IBILI --   PT/INR No results found for this basename: LABPROT:2,INR:2 in the last 72 hours Hepatitis Panel No results found for this basename: HEPBSAG,HCVAB,HEPAIGM,HEPBIGM in the last 72 hours PTH: Lab Results  Component Value Date   CALCIUM 8.6 11/28/2011   PHOS 4.3 11/28/2011  Albumin: 2.7  Anemia lab work-up: Iron: 29 Ferritin: 154 Sat: 14 Folate 7.4  B12 456  Renal lab work-up: UNa:  81 [place]: 35 UCl:  36 UCr: 50.35 UPr: 1 UA 1/10:  large RBC's, negative protein, ketones glucose, bilirubin, LE and nirtrites. Micro exam: TNTC RBC's per HPF. UA 1/11: 100 protein, 15 ketones, small bilirubin, negative blood.  C3 129 C4 31 ASO 64 Ratio UPr/Cr: 0.23 24 hr urine protein: 360 mg 24 hr urine creatinine: 1511 SPEP/UPEP-pending  ANA, ANCA and Anti-ds DNA-pending   Studies/Results: Dg Chest 2 View  11/26/2011  *RADIOLOGY REPORT*  Clinical Data: 78 year old male with shortness of breath, suspect fluid overload.  CHEST - 2 VIEW  Comparison: 1133 hours the same day and earlier.  Findings: Seated AP and lateral views of the chest.  Slightly lower lung volumes.  Stable cardiac size and mediastinal contours.  No pneumothorax.  No overt pulmonary edema.  Crowding lung markings, especially at the left base.  No consolidation.  No acute pulmonary opacity. No acute osseous abnormality identified.  IMPRESSION: Low lung volumes, otherwise no acute cardiopulmonary abnormality.  Original Report Authenticated By: Randall An, M.D.   Dg Chest 2 View  11/26/2011  *RADIOLOGY REPORT*  Clinical Data: Shortness of breath.  Dizziness.  Lightheadedness since 11/05/2011.  History of hypertension, diabetes.  CHEST - 2 VIEW  Comparison: 11/23/2011  Findings: The heart size is normal.  There is minimal left  lower lobe atelectasis.  No focal consolidations or pleural effusions are identified.  No edema.  Significant degenerate changes are seen in the thoracic spine.  IMPRESSION:  1.  Minimal left lower lobe atelectasis.  Original Report Authenticated By: Glenice Bow, M.D.    Assessment/Plan: 78 yo M with uncontrolled hypertension presents with rising Cr concerning for acute kidney injury.   1. Acute on Chronic Kidney Injury: in the setting of persistently elevated BP and intermittent orthostatic hypotension. No indication for dialysis at this time. Baseline Cr is 0.9-1.2 from June 2012. Creatinine continues to rise at  a rate of  0.3-0.6 mg/dL. Rate of rise has increased over the last 48 hrs.  No evidence of fluid overload on exam or CXR. Proteinuria quantified and not within a nephrotic range.  Kidneys on the larger side size wise by ultrasound (although he is a large man). C4, C3 and ASO within normal limits. Pending labs include  ANA, ANCA, anti ds-DNA,  and SPEP, UPEP, HIV.  Unclear etiology of worsening renal function.  If renal function continues to steadily decline in the absence of discernable etiology could require renal biopsy. 2. Hypokalemia: From lasix x 1 on 11/26/11 repleted orally with KDur 53mEq x 1 on 11/27/11.  3. ANEMIA-iron deficiency anemia. . Patient received feraheme on 11/26/11. Repeat Hgb 8.4. Stool cards still pending as pt has not had BM. Will do bedside rectal exam and FOBT today.  4. HTN/VOL-BP elevated this AM but has been better controlled over the past 24 hrs. Recommend adjusting BP management for goal SBP 140-130.  5. Dizziness: Slight interval improvement. Suspect BPV. Pt at times better after Dix-Hallpike maneuvers.  Management  per primary team.   LOS: 5 FUNCHES,JOSALYN 11/28/11 @7 :1 AM  I have seen and examined this patient and agree with the assessment/plan as outlined above by Boykin Nearing MD ([email or web address]). The exact etiology of his acute renal insufficiency remains unclear and serologies are pending. Urine output is fair and falling status is acceptable. Responded suboptimally to diuretic effort and may need to consider renal biopsy in the next 48-72 hours if renal function does not appear to reach its plateau. Still awaiting records from his admission/evaluation in [place] to see if there was any nephrotoxic exposure. Workup undergoing for possible cardiomyopathy/CHF. Aveena Bari K.,MD 11/28/2011 10:54 AM

## 2011-11-28 NOTE — Progress Notes (Signed)
Subjective: Pt reports feeling less dizzy today.  Had a long talk with the daughters that had many questions concerning their father.  The daughter and Wife report that patient has had difficulty breathing for many months now and that he has a 50 year history of smoking.  They stated that patient 2-3 weeks ago had Upper Respiratory Infection.  No acute issues overnight.  Objective: Filed Vitals:   11/27/11 2010 11/28/11 0500 11/28/11 0950 11/28/11 1400  BP: 135/71 161/68  137/67  Pulse: 70 73  72  Temp: 99.2 F (37.3 C) 100.1 F (37.8 C)  98.7 F (37.1 C)  TempSrc: Oral Oral  Oral  Resp: 18 20  16   Height:      Weight: 109.9 kg (242 lb 4.6 oz)     SpO2:  91% 93% 91%   Weight change: 2 kg (4 lb 6.6 oz)  Intake/Output Summary (Last 24 hours) at 11/28/11 1514 Last data filed at 11/28/11 0900  Gross per 24 hour  Intake   1440 ml  Output   1110 ml  Net    330 ml    General: Alert, awake, oriented x3, in no acute distress.  HEENT: No bruits, no goiter.  Heart: Regular rate and rhythm, without murmurs, rubs, gallops.  Lungs: CTA BL Abdomen: Soft, nontender, nondistended, positive bowel sounds.  Neuro: Grossly intact, nonfocal.   Lab Results:  [facility] 11/28/11 0520 11/27/11 0630  NA 137 135  K 4.0 3.4*  CL 99 99  CO2 25 28  GLUCOSE 130* 122*  BUN 41* 34*  CREATININE 4.12* 3.61*  CALCIUM 8.6 8.5  MG -- --  PHOS 4.3 4.4    Basename 11/28/11 0520 11/27/11 0630  AST -- --  ALT -- --  ALKPHOS -- --  BILITOT -- --  PROT -- --  ALBUMIN 2.7* 2.7*   No results found for this basename: LIPASE:2,AMYLASE:2 in the last 72 hours  Basename 11/28/11 0520 11/26/11 1825  WBC 6.9 6.3  NEUTROABS -- --  HGB 9.4* 9.9*  HCT 28.0* 29.5*  MCV 78.2 78.0  PLT 148* 136*   No results found for this basename: CKTOTAL:3,CKMB:3,CKMBINDEX:3,TROPONINI:3 in the last 72 hours No components found with this basename: POCBNP:3 No results found for this basename: DDIMER:2 in the last 72  hours No results found for this basename: HGBA1C:2 in the last 72 hours No results found for this basename: CHOL:2,HDL:2,LDLCALC:2,TRIG:2,CHOLHDL:2,LDLDIRECT:2 in the last 72 hours No results found for this basename: TSH,T4TOTAL,FREET3,T3FREE,THYROIDAB in the last 72 hours No results found for this basename: VITAMINB12:2,FOLATE:2,FERRITIN:2,TIBC:2,IRON:2,RETICCTPCT:2 in the last 72 hours  Micro Results: No results found for this or any previous visit (from the past 240 hour(s)).  Studies/Results: Dg Chest 2 View  11/26/2011  *RADIOLOGY REPORT*  Clinical Data: 78 year old male with shortness of breath, suspect fluid overload.  CHEST - 2 VIEW  Comparison: 1133 hours the same day and earlier.  Findings: Seated AP and lateral views of the chest.  Slightly lower lung volumes.  Stable cardiac size and mediastinal contours.  No pneumothorax.  No overt pulmonary edema.  Crowding lung markings, especially at the left base.  No consolidation.  No acute pulmonary opacity. No acute osseous abnormality identified.  IMPRESSION: Low lung volumes, otherwise no acute cardiopulmonary abnormality.  Original Report Authenticated By: Randall An, M.D.    Medications: I have reviewed the patient's current medications.  Renal failure (11/23/2011) At this point creatinine continue to rise.  Nephro on board and running more tests.  Have discussed  with family and have discussed possibility of Renal Biopsy should his condition not improve.  They seemed amenable to this suggestions.  Patient Active Hospital Problem List: Benign Positional Vertigo  At this point patient is doing better.  Will continue on Meclizine and plan on sending home with rolling walker and physical therapy.  Plan on continued therapy as outpatient.  Hypercholesterolemia () Stable  Shortness of Breath: I have ordered Echocardiogram to evaluate for Cardiomyopathy/CHF as a cause.  Given history from family this seems like a chronic problem.   Patient will also require formal Pulmonary Function Testing.  May have him get this as outpatient but will consider pending results of Echo.  Hypertension (11/23/2011) Last blood pressure reading 137/67 after recently increasing B blocker.  Will continue to monitor.  Diabetes mellitus type II (11/23/2011) Stable and well controlled.     LOS: 5 days   Velvet Bathe M.D.  [facility] 11/28/2011, 3:14 PM

## 2011-11-28 NOTE — Progress Notes (Signed)
*  PRELIMINARY RESULTS* Echocardiogram 2D Echocardiogram has been performed.  Roxine Caddy [facility] 11/28/2011, 4:12 PM

## 2011-11-29 ENCOUNTER — Inpatient Hospital Stay (HOSPITAL_COMMUNITY): Payer: Medicare Other

## 2011-11-29 LAB — RENAL FUNCTION PANEL
BUN: 48 mg/dL — ABNORMAL HIGH (ref 6–23)
CO2: 27 mEq/L (ref 19–32)
Calcium: 8.5 mg/dL (ref 8.4–10.5)
Creatinine, Ser: 4.7 mg/dL — ABNORMAL HIGH (ref 0.50–1.35)
Glucose, Bld: 125 mg/dL — ABNORMAL HIGH (ref 70–99)
Phosphorus: 4.7 mg/dL — ABNORMAL HIGH (ref 2.3–4.6)
Sodium: 138 mEq/L (ref 135–145)

## 2011-11-29 LAB — PROTEIN ELECTROPHORESIS, SERUM
Albumin ELP: 45.9 % — ABNORMAL LOW (ref 55.8–66.1)
Beta 2: 6.9 % — ABNORMAL HIGH (ref 3.2–6.5)
Total Protein ELP: 6.2 g/dL (ref 6.0–8.3)

## 2011-11-29 LAB — UIFE/LIGHT CHAINS/TP QN, 24-HR UR
Alpha 2, Urine: DETECTED — AB
Free Kappa Lt Chains,Ur: 19.9 mg/dL — ABNORMAL HIGH (ref 0.14–2.42)
Free Kappa/Lambda Ratio: 5.56 ratio (ref 2.04–10.37)
Total Protein, Urine: 32.1 mg/dL

## 2011-11-29 LAB — GLUCOSE, CAPILLARY
Glucose-Capillary: 126 mg/dL — ABNORMAL HIGH (ref 70–99)
Glucose-Capillary: 132 mg/dL — ABNORMAL HIGH (ref 70–99)
Glucose-Capillary: 158 mg/dL — ABNORMAL HIGH (ref 70–99)
Glucose-Capillary: 175 mg/dL — ABNORMAL HIGH (ref 70–99)

## 2011-11-29 MED ORDER — HYDROCORTISONE-ACETIC ACID 1-2 % OT SOLN
3.0000 [drp] | Freq: Three times a day (TID) | OTIC | Status: DC | PRN
  Filled 2011-11-29: qty 10

## 2011-11-29 MED ORDER — WHITE PETROLATUM GEL
Status: AC
  Filled 2011-11-29: qty 5

## 2011-11-29 NOTE — Progress Notes (Signed)
Subjective: Interval History: Net negative 505  ml in the last 24 hours. UO 985  ml without diuretics. Feeling better today. He states that his dizziness has improved. He reports L inner ear itching.   Objective:  Vital signs in last 24 hours:  Temp:  [98 F (36.7 C)-99.5 F (37.5 C)] 99.3 F (37.4 C) (01/15 1314) Pulse Rate:  [71-75] 73  (01/15 1314) Resp:  [16-18] 18  (01/15 1314) BP: (109-149)/(62-70) 130/67 mmHg (01/15 1314) SpO2:  [90 %-96 %] 93 % (01/15 1314) Weight change:   Intake/Output: I/O last 3 completed shifts: In: 960 [P.O.:960] Out: 1435 [Urine:1435]    EXAM: HEENT: nml TM b/l, no external auditory canal erythema or edema. No pain with manipulation of the pinna. Bimanual oral exam negative for tenderness or TMJ clicking or popping.  CVS-S1S2, RRR RS- Nml WOB. CTA b/l. With decreased air movement at bases.  No rales appreciated.  ABD-Full. Soft. NT/ND.  EXT- No edema in dependent areas.   Lab Results:  [facility] 11/28/11 0520 11/26/11 1825  WBC 6.9 6.3  HGB 9.4* 9.9*  HCT 28.0* 29.5*  PLT 148* 136*   BMET  Basename 11/29/11 0500 11/28/11 0520 11/27/11 0630  NA 138 137 135  K 3.9 4.0 3.4*  CL 99 99 99  CO2 27 25 28   GLUCOSE 125* 130* 122*  BUN 48* 41* 34*  CREATININE 4.70* 4.12* 3.61*  CALCIUM 8.5 8.6 8.5  PHOS 4.7* 4.3 4.4   LFT  Basename 11/29/11 0500  PROT --  ALBUMIN 2.6*  AST --  ALT --  ALKPHOS --  BILITOT --  BILIDIR --  IBILI --   PT/INR No results found for this basename: LABPROT:2,INR:2 in the last 72 hours Hepatitis Panel No results found for this basename: HEPBSAG,HCVAB,HEPAIGM,HEPBIGM in the last 72 hours PTH: Lab Results  Component Value Date   CALCIUM 8.5 11/29/2011   PHOS 4.7* 11/29/2011  Albumin: 2.7  Anemia lab work-up: Iron: 29 Ferritin: 154 Sat: 14 Folate 7.4  B12 456  Renal lab work-up: UNa:  81 [place]: 35 UCl: 36 UCr: 50.35 UPr: 1 UA 1/10:  large RBC's, negative protein, ketones glucose, bilirubin, LE  and nirtrites. Micro exam: TNTC RBC's per HPF. UA 1/11: 100 protein, 15 ketones, small bilirubin, negative blood.  C3 129 C4 31 ASO 64 Ratio UPr/Cr: 0.23 24 hr urine protein: 360 mg 24 hr urine creatinine: 1511 SPEP/UPEP- kappa 19.9, lambda 3.59, ratio 5.56, immunofixation pending  ANA- positive, 1:1280 Anti-ds DNA-36 (equivocal) ANCA -pending  HIV-negative   Studies/Results: 2D-ECHO: done, read pending  Assessment/Plan: 78 yo M with uncontrolled hypertension presents with rising Cr concerning for acute kidney injury.   1. Acute on Chronic Kidney Injury: in the setting of persistently elevated BP and intermittent orthostatic hypotension. No indication for dialysis at this time. Baseline Cr is 0.9-1.2 from June 2012. Creatinine continues to rise at a rate of  0.3-0.6 mg/dL.  No evidence of fluid overload on exam or CXR. Proteinuria quantified and not within a nephrotic range.  Kidneys on the larger side size wise by ultrasound (although he is a large man). C4, C3,  ASO and ds-DNA within normal limits.  HIV negative. ANA postive with high titer. Pending lab ANCA. Despite high ANA the etiology of his worsening renal function remains unclear. Will proceed with renal biopsy tomorrow.  2. ANEMIA-iron deficiency anemia. Patient received feraheme on 11/26/11. Repeat Hgb 8.4. Stool cards still pending. Will repeat CBC tomorrow AM to check Hgb and Plt for  procedure (renal biopsy).  3. HTN/VOL-BP well controlled toady,  goal SBP 140-130.  4. Dizziness: Slight interval improvement per pt report. Suspect BPPV. Pt at times better after Dix-Hallpike maneuvers. Management  per primary team.   LOS: 6 FUNCHES,JOSALYN 11/29/11 @1 :37 PM  I have seen and examined this patient and agree with the assessment/plan as outlined above by Funches MD (PGY2). His renal function continues to deteriorate without any seeming plateau of renal function. Fortunately, he is non-oliguric. Given the constellation of acute renal  failure, hematuria, mild proteinuria and a positive ANA titer in (1 in 1280)-we'll undertake renal biopsy in order to try and determine a diagnosis. Clinical suspicion of this time points towards a hemodynamically mediated acute tubular necrosis however there is need to rule out glomerulonephritis. No acute indications noted for hemodialysis at this time in point status appears to be fair. Roslin Norwood K.,MD 11/29/2011 2:18 PM

## 2011-11-29 NOTE — Progress Notes (Signed)
Physical Therapy Treatment Patient Details Name: Ryan Jimenez MRN: [id number] DOB: 1932-06-17 Today's Date: 11/29/2011  PT Assessment/Plan  PT - Assessment/Plan Comments on Treatment Session: Mr. Doten was admitted for near syncope with walking.  Pt treated yesterday for L. horizontal canal BPPV after suspected movement of otoconia from posterior canal to horizontal canal after initial treatment for L. Posterior canal. Pt reporting no dizziness today, with supine to sit pt had no dizziness. Pt declining vestibular testing and ambulation as he did not feel well (was cold and shaking - RN made aware). Pt now having some c/o symptoms that may be consistent with aural fullness.  PT entered room and pt on room air, SpO2= 86%, pt placed on 2L [place] O2 and resaturated to 92% with cues for breathing. Educated pt's wife on potential vestibular deficits and reviewed exercises given. Also discussed at length pt and wife's comfort with taking pt home. Wife now concerned about taking pt home, feels she will be unable. Both pt and wife now feel pt will benefit from [place] [facility] for rehab to promote safe eventual return home. Wife has back problems and will not be able to physically assist pt.  PT recommends SNF with vestibular rehab options.  PT Plan: Frequency remains appropriate;Discharge plan needs to be updated (Patient drove PTA and wife does not drive so HHPT) PT Frequency: Min 4X/week Follow Up Recommendations: Supervision for mobility/OOB;Skilled nursing facility Equipment Recommended: Rolling walker with 5" wheels PT Goals  Acute Rehab PT Goals PT Goal Formulation: With patient PT Goal: Supine/Side to Sit - Progress: Progressing toward goal Additional Goals Additional Goal #1: Demonstrate Vestibular exercises with out cuing and verbalize performing exercises as suggested throughout the  day.  PT Goal: Additional Goal #1 - Progress: Progressing toward goal  PT Treatment Precautions/Restrictions    Precautions Precautions: Fall Precaution Comments: Secondary to weakness and dizziness Required Braces or Orthoses: No Restrictions Weight Bearing Restrictions: No Mobility (including Balance) Bed Mobility Bed Mobility: Yes Supine to Sit: 4: Min assist;HOB elevated (Comment degrees) (HOB = 20 degrees) Supine to Sit Details (indicate cue type and reason): Assist secondary to weakness. Pt attempted first time by self, needed assist second time.  Sit to Supine: 6: Modified independent (Device/Increase time);HOB flat Transfers Transfers: No (Pt declining, returning to supine although PT requesting amb)  Posture/Postural Control Posture/Postural Control: No significant limitations Balance Balance Assessed: Yes Static Sitting Balance Static Sitting - Balance Support: No upper extremity supported Static Sitting - Level of Assistance: 5: Stand by assistance Exercise  Other Exercises Other Exercises: Verbally reviewed saccade exercises.  Patient demonstrated complete set of exercise.  End of Session PT - End of Session Activity Tolerance: Patient limited by fatigue Patient left: with call bell in reach;in bed;with family/visitor present Nurse Communication: Mobility status for transfers (Pt's complaints) General Behavior During Session: [facility] for tasks performed Cognition: [facility] for tasks performed (Although delayed somewhat)  Orlean Bradford 11/29/2011, 1:51 PM  Orlean Bradford) Oswaldo Conroy PT, DPT Acute Rehabilitation [phone]

## 2011-11-29 NOTE — Progress Notes (Signed)
Interim history:  Patient is a 78 y/o AAM with PMH of HTN, DM, HPL, Gout that presented initially with compliants with balance.  Patient initially had a CT and an MRI which were negative.  Patient was subsequently diagnosed with BPV.  But while hospitalized patient's creatinine has been steadily increasing.  Have asked nephrology to see patient and based on their most recent lab work they are not sure of a definitive diagnosis.  Plan will be for pt to get a renal biopsy tomorrow 1/16 for a more definitive diagnosis.  Family is aware and agreeable for diagnosis.  Pt has had SOB for several months now but today 1/15 has reported an increase in SOB.  Echo has been ordered and results are pending.   Subjective: Pt states that today he is feeling more short of breath than usual.  Has had some non productive cough.  Denies any fever, chills, or hemoptysis.  No acute issues overnight.   Objective: Filed Vitals:   11/29/11 1305 11/29/11 1310 11/29/11 1314 11/29/11 1453  BP:   130/67   Pulse:   73   Temp:   99.3 F (37.4 C)   TempSrc:   Oral   Resp:   18   Height:      Weight:      SpO2: 86% 92% 93% 93%   Weight change:   Intake/Output Summary (Last 24 hours) at 11/29/11 1456 Last data filed at 11/29/11 1316  Gross per 24 hour  Intake    240 ml  Output   1125 ml  Net   -885 ml    General: Alert, awake, oriented x3, in no acute distress.  HEENT: No bruits, no goiter.  Heart: Regular rate and rhythm, without murmurs, rubs, gallops.  Lungs: Decreased breath sounds at the bases, no wheezes or rhales.  Abdomen: Soft, nontender, nondistended, positive bowel sounds.  Neuro: Grossly intact, nonfocal.   Lab Results:  Basename 11/29/11 0500 11/28/11 0520  NA 138 137  K 3.9 4.0  CL 99 99  CO2 27 25  GLUCOSE 125* 130*  BUN 48* 41*  CREATININE 4.70* 4.12*  CALCIUM 8.5 8.6  MG -- --  PHOS 4.7* 4.3    Basename 11/29/11 0500 11/28/11 0520  AST -- --  ALT -- --  ALKPHOS -- --    BILITOT -- --  PROT -- --  ALBUMIN 2.6* 2.7*   No results found for this basename: LIPASE:2,AMYLASE:2 in the last 72 hours  Basename 11/28/11 0520 11/26/11 1825  WBC 6.9 6.3  NEUTROABS -- --  HGB 9.4* 9.9*  HCT 28.0* 29.5*  MCV 78.2 78.0  PLT 148* 136*   No results found for this basename: CKTOTAL:3,CKMB:3,CKMBINDEX:3,TROPONINI:3 in the last 72 hours No components found with this basename: POCBNP:3 No results found for this basename: DDIMER:2 in the last 72 hours No results found for this basename: HGBA1C:2 in the last 72 hours No results found for this basename: CHOL:2,HDL:2,LDLCALC:2,TRIG:2,CHOLHDL:2,LDLDIRECT:2 in the last 72 hours No results found for this basename: TSH,T4TOTAL,FREET3,T3FREE,THYROIDAB in the last 72 hours No results found for this basename: VITAMINB12:2,FOLATE:2,FERRITIN:2,TIBC:2,IRON:2,RETICCTPCT:2 in the last 72 hours  Micro Results: No results found for this or any previous visit (from the past 240 hour(s)).  Studies/Results: No results found.  Medications: I have reviewed the patient's current medications.   Patient Active Hospital Problem List: Renal failure (11/23/2011) Per Nephro.  Pt to get biopsy tomorrow.  Are suspecting hemodynamically mediated acute tubular necrosis but would like to r/o glomerulonephritis  Diabetes  mellitus type II (11/23/2011) Stable currently  Hypercholesterolemia () Stable   Hypertension (11/23/2011) Blood pressure has been better controlled today.  Will plan on continuing to monitor patient.  Benign positional vertigo (11/28/2011) Meclizine on board and patient condition improving.  Will most likely need rehab as outpatient with therapy pending clinical improvement.  Disposition:  Pending renal improvement.     LOS: 6 days   Velvet Bathe M.D.  [facility] 11/29/2011, 2:56 PM

## 2011-11-29 NOTE — Progress Notes (Deleted)
Patient ID: Ryan Jimenez, male   DOB: Dec 10, 1931, 78 y.o.   MRN: [id number]  Supplies provided to spouse as well as provided flush and drain care  Instructions to nephrostomy drains.  Binder ordered to assist with prevention of drain dislodgement - RN checking on.

## 2011-11-29 NOTE — H&P (Signed)
  11/29/11: Patient for random renal biopsy under ultrasound guidance in AM under moderate sedation.  Spoke with patient and family in detail regarding procedure details, potential risks including but not limited to infection, bleeding, organ damage, and complications with moderate sedation with their verbalized understanding.  All were in agreement to proceed including patient.  Will obtain labs in am prior to proceeding.  [place] nephrology sheet in chart for biopsy.  Heart and lung exam are consistent with that noted during earlier exam today.

## 2011-11-30 ENCOUNTER — Inpatient Hospital Stay (HOSPITAL_COMMUNITY): Payer: Medicare Other

## 2011-11-30 LAB — RENAL FUNCTION PANEL
CO2: 27 mEq/L (ref 19–32)
Calcium: 8.6 mg/dL (ref 8.4–10.5)
GFR calc Af Amer: 10 mL/min — ABNORMAL LOW (ref >90)
GFR calc non Af Amer: 9 mL/min — ABNORMAL LOW (ref >90)
Glucose, Bld: 137 mg/dL — ABNORMAL HIGH (ref 70–99)
Phosphorus: 5 mg/dL — ABNORMAL HIGH (ref 2.3–4.6)
Potassium: 4 mEq/L (ref 3.5–5.1)
Sodium: 135 mEq/L (ref 135–145)

## 2011-11-30 LAB — MPO/PR-3 (ANCA) ANTIBODIES

## 2011-11-30 LAB — CBC
Hemoglobin: 8.8 g/dL — ABNORMAL LOW (ref 13.0–17.0)
MCHC: 33.3 g/dL (ref 30.0–36.0)
RDW: 14.3 % (ref 11.5–15.5)
WBC: 7 10*3/uL (ref 4.0–10.5)

## 2011-11-30 LAB — APTT: aPTT: 43 seconds — ABNORMAL HIGH (ref 24–37)

## 2011-11-30 LAB — HEMOGLOBIN AND HEMATOCRIT, BLOOD: HCT: 26.7 % — ABNORMAL LOW (ref 39.0–52.0)

## 2011-11-30 LAB — PROTIME-INR
INR: 1.21 (ref 0.00–1.49)
Prothrombin Time: 15.6 seconds — ABNORMAL HIGH (ref 11.6–15.2)

## 2011-11-30 MED ORDER — SODIUM CHLORIDE 0.9 % IV SOLN
500.0000 mg | Freq: Every day | INTRAVENOUS | Status: AC
  Administered 2011-11-30 – 2011-12-02 (×3): 500 mg via INTRAVENOUS
  Filled 2011-11-30 (×5): qty 4

## 2011-11-30 MED ORDER — ALTEPLASE 100 MG IV SOLR
2.0000 mg | Freq: Once | INTRAVENOUS | Status: DC
  Filled 2011-11-30: qty 2

## 2011-11-30 MED ORDER — FENTANYL CITRATE 0.05 MG/ML IJ SOLN
INTRAMUSCULAR | Status: AC | PRN
  Administered 2011-11-30: 25 ug via INTRAVENOUS

## 2011-11-30 MED ORDER — MIDAZOLAM HCL 2 MG/2ML IJ SOLN
INTRAMUSCULAR | Status: AC
  Filled 2011-11-30: qty 4

## 2011-11-30 MED ORDER — FENTANYL CITRATE 0.05 MG/ML IJ SOLN
INTRAMUSCULAR | Status: AC
  Filled 2011-11-30: qty 4

## 2011-11-30 MED ORDER — SODIUM CHLORIDE 0.9 % IV SOLN
INTRAVENOUS | Status: AC | PRN
  Administered 2011-11-30: 50 mL/h via INTRAVENOUS

## 2011-11-30 MED ORDER — MIDAZOLAM HCL 5 MG/5ML IJ SOLN
INTRAMUSCULAR | Status: AC | PRN
  Administered 2011-11-30: 1 mg via INTRAVENOUS
  Administered 2011-11-30: 0.5 mg via INTRAVENOUS

## 2011-11-30 NOTE — Progress Notes (Signed)
Clinical Social Worker completed psychosocial assessment and placed in shadow chart. Pt agreeable to SNF search in [facility]. Clinical Education officer, museum completed FL-2 and initiated SNF search in [place]. Clinical Social Worker to follow up with pt and pt family in regard to bed offers. Clinical Social Worker to facilitate pt discharge needs when pt medically ready for discharge.  Drake Leach, MSW, [facility] [phone]

## 2011-11-30 NOTE — ED Notes (Addendum)
Pt transferred to [place] report given to Ebony Hail ,RN pt stable o2 sat 98 on 2 l N/C bx site bandaid clean dry intact

## 2011-11-30 NOTE — Progress Notes (Signed)
Interim history:  Patient is a 78 y/o AAM with PMH of HTN, DM, HPL, Gout that presented initially with compliants with balance.  Patient initially had a CT and an MRI which were negative.  Patient was subsequently diagnosed with BPV.  But while hospitalized patient's creatinine has been steadily increasing.  Have asked nephrology to see patient and based on their most recent lab work they are not sure of a definitive diagnosis.  Plan will be for pt to get a renal biopsy tomorrow 1/16 for a more definitive diagnosis.  Family is aware and agreeable for diagnosis.  Pt has had SOB for several months now but today 1/15 has reported an increase in SOB.  Echo has been ordered and results are pending.   Subjective: Pt states that today he is feeling more short of breath than usual.  Has had some non productive cough.  Denies any fever, chills, or hemoptysis.  No acute issues overnight.   Objective: Filed Vitals:   11/30/11 1230 11/30/11 1300 11/30/11 1414 11/30/11 1708  BP: 133/75 133/70 122/69 123/68  Pulse: 70 76 75 73  Temp:    98.5 F (36.9 C)  TempSrc:    Oral  Resp:    19  Height:      Weight:      SpO2: 92% 94% 95% 91%   Weight change:   Intake/Output Summary (Last 24 hours) at 11/30/11 1816 Last data filed at 11/30/11 1317  Gross per 24 hour  Intake    240 ml  Output    175 ml  Net     65 ml    General: Alert, awake, oriented x3, in no acute distress.  HEENT: No bruits, no goiter.  Heart: Regular rate and rhythm, without murmurs, rubs, gallops.  Lungs: Decreased breath sounds at the bases, no wheezes or rhales.  Abdomen: Soft, nontender, nondistended, positive bowel sounds.  Neuro: Grossly intact, nonfocal.   Lab Results:  Basename 11/30/11 0555 11/29/11 0500  NA 135 138  K 4.0 3.9  CL 98 99  CO2 27 27  GLUCOSE 137* 125*  BUN 56* 48*  CREATININE 5.51* 4.70*  CALCIUM 8.6 8.5  MG -- --  PHOS 5.0* 4.7*    Basename 11/30/11 0555 11/29/11 0500  AST -- --  ALT -- --   ALKPHOS -- --  BILITOT -- --  PROT -- --  ALBUMIN 2.7* 2.6*   No results found for this basename: LIPASE:2,AMYLASE:2 in the last 72 hours  Basename 11/30/11 1612 11/30/11 0555 11/28/11 0520  WBC -- 7.0 6.9  NEUTROABS -- -- --  HGB 8.1* 8.8* --  HCT 24.2* 26.4* --  MCV -- 77.6* 78.2  PLT -- 162 148*   No results found for this basename: CKTOTAL:3,CKMB:3,CKMBINDEX:3,TROPONINI:3 in the last 72 hours No components found with this basename: POCBNP:3 No results found for this basename: DDIMER:2 in the last 72 hours No results found for this basename: HGBA1C:2 in the last 72 hours No results found for this basename: CHOL:2,HDL:2,LDLCALC:2,TRIG:2,CHOLHDL:2,LDLDIRECT:2 in the last 72 hours No results found for this basename: TSH,T4TOTAL,FREET3,T3FREE,THYROIDAB in the last 72 hours No results found for this basename: VITAMINB12:2,FOLATE:2,FERRITIN:2,TIBC:2,IRON:2,RETICCTPCT:2 in the last 72 hours  Micro Results: No results found for this or any previous visit (from the past 240 hour(s)).  Studies/Results: US Guided Needle Placement  11/30/2011  *RADIOLOGY REPORT*  Indication: Acute on chronic renal insufficiency.  ULTRASOUND GUIDED RENAL BIOPSY  Comparison: Renal ultrasound - 11/24/2011; CTA of the abdomen pelvis - 10/14/2008  Medications:  Conscious sedation administered by nursing.  Total Moderate Sedation time: 40 minutes  Complications: The patient developed small perinephric hematoma after the second attempted biopsy, however remained asymptomatic (denied flank pain) and remained hemodynamically stable.  Procedure:  Informed written consent was obtained from the patient after a discussion of the risks, benefits and alternatives to treatment. The patient understands and consents to the procedure.  A timeout was performed prior to the initiation of the procedure.  Ultrasound scanning was performed of the left kidney, however demonstrated relatively poor sonographic window, enlarged part to the  patient's body habitus and respiratory rate.  Ultrasound scanning was performed of the right kidney demonstrating an adequate sonographic window and as such, the procedure was planned.  The operative site was prepped with betadine and draped in the usual sterile fashion.  The overlying soft tissues were anesthetized with 1% lidocaine with epinephrine.  A 17 gauge core needle biopsy device was advanced into the inferior cortex of the left kidney and 2 core biopsies were obtained under direct ultrasound guidance.  Images were saved for documentation purposes.  Review of the obtained biopsy samples failed to demonstrate adequate tissue acquisition.  Repeat ultrasound scanning demonstrated a small right-sided perinephric hematoma, and while the patient remained asymptomatic (denied flank pain and remained hemodynamically stable), the decision was made to not to attempt an additional biopsy largely in part due to now poor sonographic window.  At this point, the procedure was terminated.  A dressing was placed.  The patient tolerated the procedure well without immediate post procedural complication.  The minimal amount of acquired tissue was reviewed with pathology and deemed not substantial enough for submission.  Impression:  Unsuccessful attempted ultrasound guided right renal biopsy. Future attempts may be performed under CT guidance as clinically indicated.  Above discussed with Dr. Rosana Hoes at the time of procedure completion.  Original Report Authenticated By: Rachel Moulds, M.D.    Medications: I have reviewed the patient's current medications.    Renal failure (11/23/2011)  Biopsy was attempted today. This was unsuccessful. Patient has a perinephric hematoma at this time. Discussed with interventional radiology and nephrology. Attempt will be made again for Thursday or Friday. This was discussed with patient.  Diabetes mellitus type II (11/23/2011) Stable currently  Hypercholesterolemia () Stable    Hypertension (11/23/2011) Blood pressure has been better controlled today.  Will plan on continuing to monitor patient.  Benign positional vertigo (11/28/2011) Meclizine on board and patient condition improving.  Will most likely need rehab as outpatient with therapy pending clinical improvement.  Anemia:  We'll continue to monitor hemoglobin.  Disposition:  Patient not yet ready for discharge.     LOS: 7 days   Rosana Hoes MD, Sharlet Salina M.D.  [facility] 11/30/2011, 6:16 PM

## 2011-11-30 NOTE — Progress Notes (Signed)
Subjective: Interval History: Net negative 385  ml in the last 24 hours. UO 625  ml without diuretics.  Patient to biopsy this am. Wife states he had hot cold chills, low grade noted 100.1.  Objective:  Vital signs in last 24 hours:  Temp:  [98 F (36.7 C)-100.1 F (37.8 C)] 100.1 F (37.8 C) (01/16 0612) Pulse Rate:  [71-73] 72  (01/16 0612) Resp:  [17-20] 20  (01/16 0612) BP: (130-140)/(67-85) 138/67 mmHg (01/16 0612) SpO2:  [86 %-96 %] 93 % (01/16 0612) Weight change:   Intake/Output: I/O last 3 completed shifts: In: 240 [P.O.:240] Out: 1050 [Urine:1050]    EXAM: HEENT:  RS-   ABD-  EXT-   Lab Results:  [facility] 11/30/11 0555 11/28/11 0520  WBC 7.0 6.9  HGB 8.8* 9.4*  HCT 26.4* 28.0*  PLT 162 148*   BMET  Basename 11/30/11 0555 11/29/11 0500 11/28/11 0520  NA 135 138 137  K 4.0 3.9 4.0  CL 98 99 99  CO2 27 27 25   GLUCOSE 137* 125* 130*  BUN 56* 48* 41*  CREATININE 5.51* 4.70* 4.12*  CALCIUM 8.6 8.5 8.6  PHOS 5.0* 4.7* 4.3   LFT  Basename 11/30/11 0555  PROT --  ALBUMIN 2.7*  AST --  ALT --  ALKPHOS --  BILITOT --  BILIDIR --  IBILI --   PT/INR  Basename 11/30/11 0555  LABPROT 15.6*  INR 1.21   Hepatitis Panel No results found for this basename: HEPBSAG,HCVAB,HEPAIGM,HEPBIGM in the last 72 hours PTH: Lab Results  Component Value Date   CALCIUM 8.6 11/30/2011   PHOS 5.0* 11/30/2011  Albumin: 2.7  Anemia lab work-up: Iron: 29 Ferritin: 154 Sat: 14 Folate 7.4  B12 456  Renal lab work-up: UNa:  81 [place]: 35 UCl: 36 UCr: 50.35 UPr: 1 UA 1/10:  large RBC's, negative protein, ketones glucose, bilirubin, LE and nirtrites. Micro exam: TNTC RBC's per HPF. UA 1/11: 100 protein, 15 ketones, small bilirubin, negative blood.  C3 129 C4 31 ASO 64 Ratio UPr/Cr: 0.23 24 hr urine protein: 360 mg 24 hr urine creatinine: 1511 SPEP/UPEP- kappa 19.9, lambda 3.59, ratio 5.56, immunofixation pending  ANA- positive, 1:1280 Anti-ds DNA-36  (equivocal) ANCA -pending  HIV-negative   Studies/Results: 2D-ECHO: done, read pending     . amLODipine  10 mg Oral Daily  . carvedilol  6.25 mg Oral BID WC  . dutasteride  0.5 mg Oral Daily  . hydrALAZINE  100 mg Oral BID  . insulin aspart  0-9 Units Subcutaneous TID WC  . ketotifen  1 drop Both Eyes BID  . Tamsulosin HCl  0.4 mg Oral Daily  . terazosin  5 mg Oral Daily  . white petrolatum      . DISCONTD: aspirin EC  81 mg Oral Daily   Assessment/Plan: 78 yo M with uncontrolled hypertension presents with rising Cr concerning for acute kidney injury.   1. Acute on Chronic Kidney Injury: in the setting of persistently elevated BP and intermittent orthostatic hypotension.  No indication for dialysis at this time. Baseline Cr is 0.9-1.2 from June 2012. Creatinine continues to rise at a rate of  0.3-0.6 mg/dL.  No evidence of fluid overload on exam or CXR. Proteinuria quantified and not within a nephrotic range.  Kidneys on the larger side size wise by ultrasound (although he is a large man). C4, C3,  ASO and ds-DNA within normal limits.  HIV negative. ANA postive with high titer. Pending lab ANCA. Despite high ANA the  etiology of his worsening renal function remains unclear. Will proceed with renal biopsy.  2. ANEMIA-iron deficiency anemia. Patient received feraheme on 11/26/11. Repeat Hgb 8.8, stable. Stool cards still pending. Plts improved to 162 for procedure (renal biopsy).  3. HTN/VOL-BP well controlled today,  goal SBP 140-130 on norvasc, coreg, hydralazine, terazosin.  4. Dizziness: Slight interval improvement per pt report. Suspect BPPV. Pt at times better after Dix-Hallpike maneuvers. Management  per primary team.   LOS: 7 Camil Wilhelmsen 11/30/11 @7 :55 AM

## 2011-11-30 NOTE — Procedures (Signed)
Unsuccessful attempted percutaneous US guided right renal biopsy with inadequate tissue acquisition despite 2 passes with 17 gauge core needle device.  Patient developed small subcapsular hematoma after second biopsy, however remained asymptomatic and hemodynamically stable.  D/W clinical team after procedure completion.

## 2011-11-30 NOTE — Progress Notes (Signed)
I have seen and examined this patient and agree with the assessment/plan as outlined above by Verlee Rossetti MD ([id number]). Start empiric solumedrol given high ANA titers and plan to repeat kidney biopsy tomorrow. Remains non-oliguric and without HD needs at this time.                                      Meeghan Skipper K.,MD 11/30/2011 8:27 PM

## 2011-11-30 NOTE — H&P (Signed)
Agree with PA note. 

## 2011-11-30 NOTE — ED Notes (Signed)
When pt prone o2 sats decrreased to 88% on 3L n/c o2 increased to 5L n/c pt tolerated well

## 2011-11-30 NOTE — ED Notes (Signed)
Pre vitals B/p 141/73 Pulse 71 resp 15 o2 sats 93 on 3L n/c

## 2011-12-01 ENCOUNTER — Inpatient Hospital Stay (HOSPITAL_COMMUNITY): Payer: Medicare Other

## 2011-12-01 LAB — GLUCOSE, CAPILLARY: Glucose-Capillary: 408 mg/dL — ABNORMAL HIGH (ref 70–99)

## 2011-12-01 LAB — RENAL FUNCTION PANEL
Albumin: 2.6 g/dL — ABNORMAL LOW (ref 3.5–5.2)
CO2: 25 mEq/L (ref 19–32)
Calcium: 8.5 mg/dL (ref 8.4–10.5)
GFR calc Af Amer: 9 mL/min — ABNORMAL LOW (ref >90)
GFR calc non Af Amer: 8 mL/min — ABNORMAL LOW (ref >90)
Phosphorus: 5.3 mg/dL — ABNORMAL HIGH (ref 2.3–4.6)
Sodium: 133 mEq/L — ABNORMAL LOW (ref 135–145)

## 2011-12-01 MED ORDER — INSULIN ASPART 100 UNIT/ML ~~LOC~~ SOLN
12.0000 [IU] | Freq: Once | SUBCUTANEOUS | Status: AC
  Administered 2011-12-01: 12 [IU] via SUBCUTANEOUS

## 2011-12-01 MED ORDER — MIDAZOLAM HCL 5 MG/5ML IJ SOLN
INTRAMUSCULAR | Status: AC | PRN
  Administered 2011-12-01: 2 mg via INTRAVENOUS

## 2011-12-01 MED ORDER — FENTANYL CITRATE 0.05 MG/ML IJ SOLN
INTRAMUSCULAR | Status: AC | PRN
  Administered 2011-12-01 (×2): 25 ug via INTRAVENOUS

## 2011-12-01 MED ORDER — INSULIN ASPART 100 UNIT/ML ~~LOC~~ SOLN
0.0000 [IU] | Freq: Three times a day (TID) | SUBCUTANEOUS | Status: DC
  Administered 2011-12-02: 2 [IU] via SUBCUTANEOUS
  Administered 2011-12-02 – 2011-12-03 (×2): 9 [IU] via SUBCUTANEOUS
  Administered 2011-12-03: 7 [IU] via SUBCUTANEOUS
  Administered 2011-12-03: 9 [IU] via SUBCUTANEOUS
  Administered 2011-12-04: 3 [IU] via SUBCUTANEOUS
  Administered 2011-12-04: 5 [IU] via SUBCUTANEOUS
  Administered 2011-12-04: 7 [IU] via SUBCUTANEOUS
  Administered 2011-12-05: 1 [IU] via SUBCUTANEOUS
  Administered 2011-12-05: 2 [IU] via SUBCUTANEOUS
  Administered 2011-12-05 (×3): 3 [IU] via SUBCUTANEOUS
  Administered 2011-12-06: 2 [IU] via SUBCUTANEOUS
  Administered 2011-12-06: 7 [IU] via SUBCUTANEOUS
  Administered 2011-12-06: 5 [IU] via SUBCUTANEOUS
  Administered 2011-12-07: 1 [IU] via SUBCUTANEOUS
  Administered 2011-12-07: 5 [IU] via SUBCUTANEOUS
  Administered 2011-12-07: 2 [IU] via SUBCUTANEOUS
  Administered 2011-12-07: 3 [IU] via SUBCUTANEOUS
  Administered 2011-12-07: 5 [IU] via SUBCUTANEOUS
  Administered 2011-12-08: 2 [IU] via SUBCUTANEOUS
  Administered 2011-12-08 (×2): 3 [IU] via SUBCUTANEOUS
  Administered 2011-12-09: 1 [IU] via SUBCUTANEOUS
  Administered 2011-12-09: 2 [IU] via SUBCUTANEOUS
  Administered 2011-12-10: 1 [IU] via SUBCUTANEOUS
  Administered 2011-12-11 (×2): 2 [IU] via SUBCUTANEOUS
  Administered 2011-12-11: 5 [IU] via SUBCUTANEOUS
  Administered 2011-12-13: 3 [IU] via SUBCUTANEOUS
  Administered 2011-12-13: 7 [IU] via SUBCUTANEOUS
  Administered 2011-12-13: 5 [IU] via SUBCUTANEOUS
  Administered 2011-12-14 (×2): 3 [IU] via SUBCUTANEOUS
  Administered 2011-12-14: 5 [IU] via SUBCUTANEOUS
  Administered 2011-12-15: 1 [IU] via SUBCUTANEOUS
  Administered 2011-12-15: 3 [IU] via SUBCUTANEOUS
  Administered 2011-12-15: 2 [IU] via SUBCUTANEOUS
  Administered 2011-12-15: 3 [IU] via SUBCUTANEOUS
  Administered 2011-12-16: 1 [IU] via SUBCUTANEOUS
  Administered 2011-12-16: 3 [IU] via SUBCUTANEOUS
  Administered 2011-12-17 (×2): 1 [IU] via SUBCUTANEOUS
  Filled 2011-12-01: qty 3

## 2011-12-01 MED ORDER — FENTANYL CITRATE 0.05 MG/ML IJ SOLN
INTRAMUSCULAR | Status: AC
  Filled 2011-12-01: qty 6

## 2011-12-01 MED ORDER — MIDAZOLAM HCL 2 MG/2ML IJ SOLN
INTRAMUSCULAR | Status: AC
  Filled 2011-12-01: qty 6

## 2011-12-01 MED ORDER — INSULIN GLARGINE 100 UNIT/ML ~~LOC~~ SOLN
10.0000 [IU] | Freq: Every day | SUBCUTANEOUS | Status: DC
  Administered 2011-12-01: 10 [IU] via SUBCUTANEOUS
  Filled 2011-12-01: qty 3

## 2011-12-01 NOTE — Progress Notes (Signed)
Occupational Therapy Treatment Patient Details Name: Ryan Jimenez MRN: [id number] DOB: 27-Sep-1932 Today's Date: 12/01/2011  No c/o pain in session Time 855-915 OT Assessment/Plan OT Assessment/Plan Comments on Treatment Session: Pt tolerated session well. Focused on standing balance, tolerance with occassional steady A with grooming at the sink, activity tolerance, functional ambulation with RW in the hallway using visual and head positioning strageties to decreased dizziness with funcitonal mobility  especially  with turns,(pt needed min A with functional ambulation. Pt on 3 liters of O2  and maintain O2 sats above 90%, however with increased fatigue by end of session. Pt hopes to now discharge home with wife if he can manage himself  better. OT Plan: Discharge plan remains appropriate;Frequency remains appropriate Follow Up Recommendations: Home health OT OT Goals ADL Goals ADL Goal: Grooming - Progress: Met ADL Goal: Toilet Transfer - Progress: Progressing toward goals ADL Goal: Toileting - Clothing Manipulation - Progress: Progressing toward goals ADL Goal: Toileting - Hygiene - Progress: Progressing toward goals  OT Treatment Precautions/Restrictions  Precautions Precautions: Fall Precaution Comments: vestibular concerns Required Braces or Orthoses: No Restrictions Weight Bearing Restrictions: No   ADL ADL Grooming: Wash/dry hands;Teeth care;Denture care;Performed Where Assessed - Grooming: Standing at sink Toilet Transfer: Simulated;Minimal assistance Toilet Transfer Method: Ambulating Mobility  Bed Mobility Supine to Sit: 5: Supervision Transfers Transfers: Yes Sit to Stand: 5: Supervision Stand to Sit: 5: Supervision  End of Session OT - End of Session Equipment Utilized During Treatment:  (RW) Activity Tolerance: Patient limited by fatigue Patient left: in chair;with call bell in reach Nurse Communication: Mobility status for transfers General Behavior  During Session: [facility] for tasks performed Cognition: [facility] for tasks performed  Merrilee Seashore  12/01/2011, 12:08 PM

## 2011-12-01 NOTE — Progress Notes (Addendum)
Physical Therapy Treatment Patient Details Name: Ryan Jimenez MRN: [id number] DOB: 1932-06-21 Today's Date: 12/01/2011  PT Assessment/Plan  PT - Assessment/Plan Comments on Treatment Session: Patient is a 78 y/o AAM with PMH of HTN, DM, HPL, Gout that presented initially with complaints with balance. Pt reports feeling less dizzy today. Worked on exercises in the bed and his saccade eye exercises. He states that his eyes still feel fatigued after doing the exercises but that he is getting better at them. Continue PT to work on endurance, balance, and strength. Talked with pt more about his options after D/C and he is still trying to decide where to go. PT Plan: Discharge plan remains appropriate PT Frequency: Decrease frequency to 2x/week secondary to d/c plan to NH Follow Up Recommendations: Skilled nursing facility Equipment Recommended: Defer to next venue PT Goals  Acute Rehab PT Goals PT Goal: Supine/Side to Sit - Progress: Other (comment) PT Goal: Sit at Edge Of Bed - Progress: Other (comment) PT Goal: Sit to Supine/Side - Progress: Other (comment) PT Goal: Sit to Stand - Progress: Other (comment) PT Goal: Stand to Sit - Progress: Other (comment) PT Goal: Stand - Progress: Other (comment) PT Goal: Ambulate - Progress: Other (comment) Pt will Perform Home Exercise Program: Independently PT Goal: Perform Home Exercise Program - Progress: Goal set today Additional Goals PT Goal: Additional Goal #1 - Progress: Progressing toward goal  PT Treatment Precautions/Restrictions  Precautions Precautions: Fall Precaution Comments: vestibular concerns Required Braces or Orthoses: No Restrictions Weight Bearing Restrictions: No Mobility (including Balance) Bed Mobility Bed Mobility: No Supine to Sit: 5: Supervision Transfers Sit to Stand: 5: Supervision Stand to Sit: 5: Supervision    Exercise  General Exercises - Lower Extremity Ankle Circles/Pumps: AROM;Strengthening;Both;10  reps;Supine Heel Slides: AROM;Strengthening;Both;10 reps;Supine Hip ABduction/ADduction: AROM;Strengthening;Both;10 reps;Supine Straight Leg Raises: AROM;Strengthening;Both;10 reps End of Session PT - End of Session Activity Tolerance: Patient tolerated treatment well Patient left: in bed;with call bell in reach;with family/visitor present General Behavior During Session: [facility] for tasks performed Cognition: [facility] for tasks performed  Dulce Sellar 12/01/2011, 1:53 PM [facility] [phone] (pager)

## 2011-12-01 NOTE — Progress Notes (Signed)
Interim history:  Patient is a 78 y/o AAM with PMH of HTN, DM, HPL, Gout that presented initially with compliants with balance.  Patient initially had a CT and an MRI which were negative.  Patient was subsequently diagnosed with BPV.  But while hospitalized patient's creatinine has been steadily increasing.  Have asked nephrology to see patient and based on their most recent lab work they are not sure of a definitive diagnosis.  Plan will be for pt to get a renal biopsy tomorrow 1/16 for a more definitive diagnosis.  Family is aware and agreeable for diagnosis.  Pt has had SOB for several months now but today 1/15 has reported an increase in SOB.  Echo has been ordered and results are pending.   Subjective: Pt  Is without any complaints.   Objective: Filed Vitals:   12/01/11 1509 12/01/11 1516 12/01/11 1552 12/01/11 1755  BP: 128/42 115/46 132/79 162/71  Pulse: 80 68 69 69  Temp:   98 F (36.7 C) 98.1 F (36.7 C)  TempSrc:   Oral Oral  Resp: 18 16 18 18   Height:      Weight:      SpO2: 91% 94% 94% 92%   Weight change:   Intake/Output Summary (Last 24 hours) at 12/01/11 2144 Last data filed at 12/01/11 1847  Gross per 24 hour  Intake    720 ml  Output   1030 ml  Net   -310 ml    General: Alert, awake, oriented x3, in no acute distress.  HEENT: No bruits, no goiter.  Heart: Regular rate and rhythm, without murmurs, rubs, gallops.  Lungs: Decreased breath sounds at the bases, no wheezes or rhales.  Abdomen: Soft, nontender, nondistended, positive bowel sounds.  Neuro: Grossly intact, nonfocal.   Lab Results:  [facility] 12/01/11 0515 11/30/11 0555  NA 133* 135  K 4.9 4.0  CL 96 98  CO2 25 27  GLUCOSE 352* 137*  BUN 73* 56*  CREATININE 6.05* 5.51*  CALCIUM 8.5 8.6  MG -- --  PHOS 5.3* 5.0*    Basename 12/01/11 0515 11/30/11 0555  AST -- --  ALT -- --  ALKPHOS -- --  BILITOT -- --  PROT -- --  ALBUMIN 2.6* 2.7*   No results found for this basename:  LIPASE:2,AMYLASE:2 in the last 72 hours  Basename 11/30/11 2229 11/30/11 1612 11/30/11 0555  WBC -- -- 7.0  NEUTROABS -- -- --  HGB 9.1* 8.1* --  HCT 26.7* 24.2* --  MCV -- -- 77.6*  PLT -- -- 162   No results found for this basename: CKTOTAL:3,CKMB:3,CKMBINDEX:3,TROPONINI:3 in the last 72 hours No components found with this basename: POCBNP:3 No results found for this basename: DDIMER:2 in the last 72 hours No results found for this basename: HGBA1C:2 in the last 72 hours No results found for this basename: CHOL:2,HDL:2,LDLCALC:2,TRIG:2,CHOLHDL:2,LDLDIRECT:2 in the last 72 hours No results found for this basename: TSH,T4TOTAL,FREET3,T3FREE,THYROIDAB in the last 72 hours No results found for this basename: VITAMINB12:2,FOLATE:2,FERRITIN:2,TIBC:2,IRON:2,RETICCTPCT:2 in the last 72 hours  Micro Results: No results found for this or any previous visit (from the past 240 hour(s)).  Studies/Results: US Guided Needle Placement  11/30/2011  *RADIOLOGY REPORT*  Indication: Acute on chronic renal insufficiency.  ULTRASOUND GUIDED RENAL BIOPSY  Comparison: Renal ultrasound - 11/24/2011; CTA of the abdomen pelvis - 10/14/2008  Medications: Conscious sedation administered by nursing.  Total Moderate Sedation time: 40 minutes  Complications: The patient developed small perinephric hematoma after the second attempted biopsy, however  remained asymptomatic (denied flank pain) and remained hemodynamically stable.  Procedure:  Informed written consent was obtained from the patient after a discussion of the risks, benefits and alternatives to treatment. The patient understands and consents to the procedure.  A timeout was performed prior to the initiation of the procedure.  Ultrasound scanning was performed of the left kidney, however demonstrated relatively poor sonographic window, enlarged part to the patient's body habitus and respiratory rate.  Ultrasound scanning was performed of the right kidney  demonstrating an adequate sonographic window and as such, the procedure was planned.  The operative site was prepped with betadine and draped in the usual sterile fashion.  The overlying soft tissues were anesthetized with 1% lidocaine with epinephrine.  A 17 gauge core needle biopsy device was advanced into the inferior cortex of the left kidney and 2 core biopsies were obtained under direct ultrasound guidance.  Images were saved for documentation purposes.  Review of the obtained biopsy samples failed to demonstrate adequate tissue acquisition.  Repeat ultrasound scanning demonstrated a small right-sided perinephric hematoma, and while the patient remained asymptomatic (denied flank pain and remained hemodynamically stable), the decision was made to not to attempt an additional biopsy largely in part due to now poor sonographic window.  At this point, the procedure was terminated.  A dressing was placed.  The patient tolerated the procedure well without immediate post procedural complication.  The minimal amount of acquired tissue was reviewed with pathology and deemed not substantial enough for submission.  Impression:  Unsuccessful attempted ultrasound guided right renal biopsy. Future attempts may be performed under CT guidance as clinically indicated.  Above discussed with Dr. Rosana Hoes at the time of procedure completion.  Original Report Authenticated By: Rachel Moulds, M.D.   2D Echo report reviewed.  Medications: I have reviewed the patient's current medications.    Renal failure (11/23/2011)  Biopsy was attempted yesterday. This was unsuccessful. Patient has a perinephric hematoma at this time. Biopsy successful today per interventional radiology.  Diabetes mellitus type II (11/23/2011) Blood sugars elevated secondary to steroids.  Start low dose Lantus and continue SSI.   Hypercholesterolemia () Stable   Hypertension (11/23/2011) Blood pressure is elevated today.  Will plan on continuing to  monitor, if trend continues may need to increase medication.  Benign positional vertigo (11/28/2011) Meclizine on board and patient condition improving.  Will most likely need rehab as outpatient with therapy pending clinical improvement.  Anemia:  We'll continue to monitor hemoglobin.  Disposition:  Patient not yet ready for discharge.     LOS: 8 days   Rosana Hoes MD, Sharlet Salina M.D.  [facility] 12/01/2011, 9:44 PM

## 2011-12-01 NOTE — Progress Notes (Signed)
Inpatient Diabetes Program Recommendations  AACE/ADA: New Consensus Statement on Inpatient Glycemic Control (2009)  Target Ranges:  Prepandial:   less than 140 mg/dL      Peak postprandial:   less than 180 mg/dL (1-2 hours)      Critically ill patients:  140 - 180 mg/dL   CBGs 01/16: 147/ 147/ 193/ 220 mg/dl CBGs today: 320 mg/dl  Inpatient Diabetes Program Recommendations Insulin - Basal: Please start Lantus 20 units QHS (0.2 units/kg)  Note: Started IV Solumedrol yesterday.  CBGs elevated.  Pt has good control at home (A1C 6.7%- 11/24/11). Will follow.

## 2011-12-01 NOTE — Progress Notes (Signed)
I have seen and examined this patient and agree with the assessment/plan as outlined above by Zigmund Daniel MD. Plan to retry kidney biopsy today. Started empirically on solumedrol yesterday due to constellation of high ANA and worsening renal function. Rumor Sun K.,MD 12/01/2011 10:51 AM

## 2011-12-01 NOTE — Progress Notes (Signed)
Patient npo

## 2011-12-01 NOTE — Procedures (Signed)
L renal core Bx No comp

## 2011-12-01 NOTE — Progress Notes (Signed)
Subjective: Interval History: Net positive 160cc in the last 24 hours. UO 80 ml without diuretics.  Patient to reattempt renal biopsy this am after failed attempt yesterday. Afebrile overnight, remains on [place]. Pt states his urine is now bloody appearing after biopsy attempt, frequency.   Objective:  Vital signs in last 24 hours:  Temp:  [98.2 F (36.8 C)-98.7 F (37.1 C)] 98.6 F (37 C) (01/17 0542) Pulse Rate:  [65-97] 75  (01/17 0542) Resp:  [10-21] 18  (01/17 0542) BP: (108-143)/(43-82) 143/70 mmHg (01/17 0542) SpO2:  [90 %-97 %] 93 % (01/17 0542) Weight:  [239 lb 10.2 oz (108.7 kg)] 239 lb 10.2 oz (108.7 kg) (01/16 2249) Weight change:   Intake/Output: I/O last 3 completed shifts: In: 10 [P.O.:960] Out: 975 [Urine:975]    EXAM: HEENT: NCAT, NAD RS- CTA anteriorally. Nonlabored.  ABD- Nontender, BS + EXT- No edema, nontender. NEURO-a/o x3. No asterixis. No gross motor deficits.   Lab Results:  Basename 11/30/11 2229 11/30/11 1612 11/30/11 0555  WBC -- -- 7.0  HGB 9.1* 8.1* 8.8*  HCT 26.7* 24.2* 26.4*  PLT -- -- 162   BMET  Basename 12/01/11 0515 11/30/11 0555 11/29/11 0500  NA 133* 135 138  K 4.9 4.0 3.9  CL 96 98 99  CO2 25 27 27   GLUCOSE 352* 137* 125*  BUN 73* 56* 48*  CREATININE 6.05* 5.51* 4.70*  CALCIUM 8.5 8.6 8.5  PHOS 5.3* 5.0* 4.7*   LFT  Basename 12/01/11 0515  PROT --  ALBUMIN 2.6*  AST --  ALT --  ALKPHOS --  BILITOT --  BILIDIR --  IBILI --   PT/INR  Basename 11/30/11 0555  LABPROT 15.6*  INR 1.21   Hepatitis Panel No results found for this basename: HEPBSAG,HCVAB,HEPAIGM,HEPBIGM in the last 72 hours PTH: Lab Results  Component Value Date   CALCIUM 8.5 12/01/2011   PHOS 5.3* 12/01/2011  Albumin: 2.7  Anemia lab work-up: Iron: 29 Ferritin: 154 Sat: 14 Folate 7.4  B12 456  Renal lab work-up: UNa:  81 [place]: 35 UCl: 36 UCr: 50.35 UPr: 1 UA 1/10:  large RBC's, negative protein, ketones glucose, bilirubin, LE and  nirtrites. Micro exam: TNTC RBC's per HPF. UA 1/11: 100 protein, 15 ketones, small bilirubin, negative blood.  C3 129 C4 31 ASO 64 Ratio UPr/Cr: 0.23 24 hr urine protein: 360 mg 24 hr urine creatinine: 1511 SPEP/UPEP- kappa 19.9, lambda 3.59, ratio 5.56, immunofixation pending  ANA- positive, 1:1280 Anti-ds DNA-36 (equivocal) ANCA -pending  HIV-negative   Studies/Results: 2D-ECHO: EF 55-60%, normal wall motion     . amLODipine  10 mg Oral Daily  . carvedilol  6.25 mg Oral BID WC  . dutasteride  0.5 mg Oral Daily  . fentaNYL      . hydrALAZINE  100 mg Oral BID  . insulin aspart  0-9 Units Subcutaneous TID WC  . ketotifen  1 drop Both Eyes BID  . methylPREDNISolone (SOLU-MEDROL) injection  500 mg Intravenous Daily  . midazolam      . Tamsulosin HCl  0.4 mg Oral Daily  . terazosin  5 mg Oral Daily  . DISCONTD: alteplase  2 mg Intracatheter Once   Assessment/Plan: 78 yo M with uncontrolled hypertension presents with rising Cr concerning for acute kidney injury of unknown etiology.   1. Acute on Chronic Kidney Injury: in the setting of persistently elevated BP and intermittent orthostatic hypotension.  Creatinine continues to rise at a rate of  0.3-0.6 mg/dL. No indication for dialysis  at this time. Baseline Cr is 0.9-1.2 from June 2012.  No evidence of fluid overload on exam or CXR. Proteinuria quantified and not within a nephrotic range.  Kidneys on the larger side size wise by ultrasound (although he is a large man). C4, C3,  ASO and ds-DNA within normal limits.  HIV negative. ANA postive with high titer. Pending lab ANCA. Despite high ANA the etiology of his worsening renal function remains unclear. Will proceed with renal biopsy today unless hemodynamically unstable if IR is willing to try again. Started on solumedrol empirically 11/30/11.  2. ANEMIA-iron deficiency anemia. Patient received feraheme on 11/26/11. Repeat Hgb stable. Stool cards still pending. Plts improved to 162 for  procedure (renal biopsy).  3. HTN/VOL-BP well controlled today,  goal SBP 140-130 on norvasc, coreg, hydralazine, terazosin.  4. Dizziness: Slight interval improvement per pt report. Suspect BPPV. Pt at times better after Dix-Hallpike maneuvers. Management  per primary team.   LOS: 8 Artha Chiasson 11/30/11 @7 :22 AM

## 2011-12-02 ENCOUNTER — Inpatient Hospital Stay (HOSPITAL_COMMUNITY): Payer: Medicare Other

## 2011-12-02 LAB — TYPE AND SCREEN
Antibody Screen: NEGATIVE
Unit division: 0

## 2011-12-02 LAB — THERAPEUTIC PLASMA EXCHANGE (BLOOD BANK)
Plasma Exchange: 4500
Unit division: 0
Unit division: 0
Unit division: 0
Unit division: 0
Unit division: 0
Unit division: 0
Unit division: 0

## 2011-12-02 LAB — RENAL FUNCTION PANEL
BUN: 91 mg/dL — ABNORMAL HIGH (ref 6–23)
CO2: 25 mEq/L (ref 19–32)
Calcium: 8.6 mg/dL (ref 8.4–10.5)
Chloride: 96 mEq/L (ref 96–112)
Creatinine, Ser: 6.6 mg/dL — ABNORMAL HIGH (ref 0.50–1.35)

## 2011-12-02 LAB — HEPATITIS PANEL, ACUTE
HCV Ab: NEGATIVE
Hep A IgM: NEGATIVE
Hep B C IgM: NEGATIVE

## 2011-12-02 LAB — GLUCOSE, CAPILLARY: Glucose-Capillary: 195 mg/dL — ABNORMAL HIGH (ref 70–99)

## 2011-12-02 LAB — APTT: aPTT: 45 seconds — ABNORMAL HIGH (ref 24–37)

## 2011-12-02 LAB — HEPATITIS B SURFACE ANTIGEN: Hepatitis B Surface Ag: NEGATIVE

## 2011-12-02 MED ORDER — ACD FORMULA A 0.73-2.45-2.2 GM/100ML VI SOLN
500.0000 mL | Status: DC
  Filled 2011-12-02: qty 500

## 2011-12-02 MED ORDER — SODIUM CHLORIDE 0.9 % IV SOLN
4.0000 g | INTRAVENOUS | Status: DC | PRN
  Filled 2011-12-02: qty 40

## 2011-12-02 MED ORDER — CALCIUM CARBONATE ANTACID 500 MG PO CHEW
2.0000 | CHEWABLE_TABLET | ORAL | Status: AC
  Filled 2011-12-02 (×2): qty 2

## 2011-12-02 MED ORDER — CEFAZOLIN SODIUM 1-5 GM-% IV SOLN
1.0000 g | Freq: Once | INTRAVENOUS | Status: AC
  Administered 2011-12-02: 1 g via INTRAVENOUS
  Filled 2011-12-02: qty 50

## 2011-12-02 MED ORDER — CYCLOPHOSPHAMIDE 50 MG PO TABS
200.0000 mg | ORAL_TABLET | Freq: Every day | ORAL | Status: DC
  Administered 2011-12-02 – 2011-12-13 (×10): 200 mg via ORAL
  Filled 2011-12-02 (×13): qty 4

## 2011-12-02 MED ORDER — VITAMIN D (ERGOCALCIFEROL) 1.25 MG (50000 UNIT) PO CAPS
50000.0000 [IU] | ORAL_CAPSULE | ORAL | Status: DC
  Administered 2011-12-04 – 2011-12-11 (×2): 50000 [IU] via ORAL
  Filled 2011-12-02 (×2): qty 1

## 2011-12-02 MED ORDER — SODIUM CHLORIDE 0.9 % IV SOLN
2.0000 g | INTRAVENOUS | Status: DC | PRN
  Filled 2011-12-02 (×2): qty 20

## 2011-12-02 MED ORDER — HEPARIN SODIUM (PORCINE) 1000 UNIT/ML IJ SOLN
INTRAMUSCULAR | Status: AC
  Filled 2011-12-02: qty 1

## 2011-12-02 MED ORDER — GELATIN ABSORBABLE 12-7 MM EX MISC
CUTANEOUS | Status: AC
  Filled 2011-12-02: qty 1

## 2011-12-02 MED ORDER — FENTANYL CITRATE 0.05 MG/ML IJ SOLN
INTRAMUSCULAR | Status: DC | PRN
  Administered 2011-12-02: 50 ug via INTRAVENOUS

## 2011-12-02 MED ORDER — ACETAMINOPHEN 325 MG PO TABS
650.0000 mg | ORAL_TABLET | ORAL | Status: DC | PRN
  Filled 2011-12-02: qty 2

## 2011-12-02 MED ORDER — CALCIUM CARBONATE ANTACID 500 MG PO CHEW
2.0000 | CHEWABLE_TABLET | ORAL | Status: DC | PRN
  Administered 2011-12-03 – 2011-12-05 (×2): 1000 mg via ORAL
  Filled 2011-12-02: qty 2

## 2011-12-02 MED ORDER — DIPHENHYDRAMINE HCL 25 MG PO CAPS
25.0000 mg | ORAL_CAPSULE | Freq: Four times a day (QID) | ORAL | Status: DC | PRN
  Administered 2011-12-03: 25 mg via ORAL

## 2011-12-02 MED ORDER — INSULIN GLARGINE 100 UNIT/ML ~~LOC~~ SOLN
16.0000 [IU] | Freq: Every day | SUBCUTANEOUS | Status: DC
  Administered 2011-12-02: 16 [IU] via SUBCUTANEOUS
  Filled 2011-12-02: qty 3

## 2011-12-02 MED ORDER — ACD FORMULA A 0.73-2.45-2.2 GM/100ML VI SOLN
Status: AC
  Filled 2011-12-02: qty 500

## 2011-12-02 MED ORDER — CALCIUM GLUCONATE 10 % IV SOLN
2.0000 g | INTRAVENOUS | Status: DC | PRN
  Administered 2011-12-03: 2 g via INTRAVENOUS
  Filled 2011-12-02: qty 20

## 2011-12-02 MED ORDER — PREDNISONE 50 MG PO TABS
60.0000 mg | ORAL_TABLET | Freq: Every day | ORAL | Status: DC
  Administered 2011-12-03 – 2011-12-17 (×13): 60 mg via ORAL
  Filled 2011-12-02 (×18): qty 1

## 2011-12-02 MED ORDER — MIDAZOLAM HCL 5 MG/5ML IJ SOLN
INTRAMUSCULAR | Status: DC | PRN
  Administered 2011-12-02: 1.5 mg via INTRAVENOUS

## 2011-12-02 MED ORDER — ALBUMIN HUMAN 25 % IV SOLN
Freq: Once | INTRAVENOUS | Status: AC
  Administered 2011-12-05: 11:00:00 via INTRAVENOUS_CENTRAL
  Filled 2011-12-02: qty 200

## 2011-12-02 MED ORDER — HEPARIN SODIUM (PORCINE) 1000 UNIT/ML IJ SOLN
1000.0000 [IU] | Freq: Once | INTRAMUSCULAR | Status: DC
  Filled 2011-12-02: qty 1

## 2011-12-02 NOTE — Progress Notes (Signed)
Subjective: Interval History: Net negative 415cc in the last 24 hours. UO 655 ml without diuretics.  Patient underwent renal biopsy yesterday. Afebrile overnight, remains on [place]. He has coughed up some red substance ?blood after breakfast but ate some ketchup on taters. Denies any hematuria.   Objective:  Vital signs in last 24 hours:  Temp:  [97.9 F (36.6 C)-98.4 F (36.9 C)] 97.9 F (36.6 C) (01/18 0532) Pulse Rate:  [62-80] 62  (01/18 0532) Resp:  [16-24] 18  (01/18 0532) BP: (115-162)/(42-79) 125/66 mmHg (01/18 0532) SpO2:  [20 %-97 %] 95 % (01/18 0532) Weight:  [245 lb 2.4 oz (111.2 kg)] 245 lb 2.4 oz (111.2 kg) (01/17 2138) Weight change: 5 lb 8.2 oz (2.5 kg)  Intake/Output: I/O last 3 completed shifts: In: 720 [P.O.:720] Out: 1255 [Urine:1255]    EXAM: HEENT: NCAT, NAD RS- Rt basilar rales. Nonlabored.  ABD- Nontender, BS + EXT- Trace edema, nontender. Left Coram catheter.  NEURO-a/o x3. No asterixis. No gross motor deficits.   Lab Results:  Basename 11/30/11 2229 11/30/11 1612 11/30/11 0555  WBC -- -- 7.0  HGB 9.1* 8.1* 8.8*  HCT 26.7* 24.2* 26.4*  PLT -- -- 162   BMET  Basename 12/02/11 0420 12/01/11 0515 11/30/11 0555  NA 133* 133* 135  K 4.4 4.9 4.0  CL 96 96 98  CO2 25 25 27   GLUCOSE 256* 352* 137*  BUN 91* 73* 56*  CREATININE 6.60* 6.05* 5.51*  CALCIUM 8.6 8.5 8.6  PHOS 5.0* 5.3* 5.0*   LFT  Basename 12/02/11 0420  PROT --  ALBUMIN 2.7*  AST --  ALT --  ALKPHOS --  BILITOT --  BILIDIR --  IBILI --   PT/INR  Basename 11/30/11 0555  LABPROT 15.6*  INR 1.21   Hepatitis Panel  Basename 11/30/11 1612  HEPBSAG NEGATIVE  HCVAB NEGATIVE  HEPAIGM PENDING  HEPBIGM PENDING   PTH: Lab Results  Component Value Date   CALCIUM 8.6 12/02/2011   PHOS 5.0* 12/02/2011  Albumin: 2.7  Anemia lab work-up: Iron: 29 Ferritin: 154 Sat: 14 Folate 7.4  B12 456  Renal lab work-up: UNa:  81 [place]: 35 UCl: 36 UCr: 50.35 UPr: 1 UA 1/10:  large  RBC's, negative protein, ketones glucose, bilirubin, LE and nirtrites. Micro exam: TNTC RBC's per HPF. UA 1/11: 100 protein, 15 ketones, small bilirubin, negative blood.  C3 129 C4 31 ASO 64 Ratio UPr/Cr: 0.23 24 hr urine protein: 360 mg 24 hr urine creatinine: 1511 SPEP/UPEP- kappa 19.9, lambda 3.59, ratio 5.56, immunofixation pending  ANA- positive, 1:1280 Anti-ds DNA-36 (equivocal) ANCA -pending  HIV-negative   Studies/Results: 2D-ECHO: EF 55-60%, normal wall motion     . amLODipine  10 mg Oral Daily  . carvedilol  6.25 mg Oral BID WC  . dutasteride  0.5 mg Oral Daily  . fentaNYL      . hydrALAZINE  100 mg Oral BID  . insulin aspart  0-9 Units Subcutaneous TID AC & HS  . insulin aspart  12 Units Subcutaneous Once  . insulin glargine  10 Units Subcutaneous QHS  . ketotifen  1 drop Both Eyes BID  . methylPREDNISolone (SOLU-MEDROL) injection  500 mg Intravenous Daily  . midazolam      . Tamsulosin HCl  0.4 mg Oral Daily  . terazosin  5 mg Oral Daily  . DISCONTD: insulin aspart  0-9 Units Subcutaneous TID WC   Assessment/Plan: 78 yo M with uncontrolled hypertension presents with rising Cr concerning for progressive acute  kidney injury of unknown etiology, ?glomerulonephritis.   1. Acute on Chronic Kidney Injury: in the setting of persistently elevated BP and intermittent orthostatic hypotension. Baseline Cr is 0.9-1.2 from June 2012.  Creatinine continues to rise at a rate of  0.3-0.6 mg/dL. BUN rising, will monitor neuro exam for indication of HD, none today. No evidence of fluid overload on exam or CXR.  Proteinuria quantified and not within a nephrotic range.  Kidneys on the larger side size wise by ultrasound (although he is a large man). C4, C3,  ASO and ds-DNA within normal limits.  HIV negative. ANA postive with high titer. Pending lab ANCA, anti-GBM. Despite high ANA the etiology of his worsening renal function remains unclear. Will follow up renal biopsy results. Question  if new hemoptysis may indicate pulmonary work up also. Started on solumedrol empirically 11/30/11.  2. ANEMIA-iron deficiency anemia. Patient received feraheme on 11/26/11. Repeat Hgb stable. Stool cards still pending. Will f/u CBC post-biopsy in setting of hematuria.  3. HTN/VOL-BP well controlled today,  goal SBP 140-130 on norvasc, coreg, hydralazine, terazosin.  4. Dizziness: Slight interval improvement per pt report. Suspect BPPV. Pt at times better after Dix-Hallpike maneuvers. Management  per primary team.   LOS: 9 Ryan Jimenez 12/02/2011  @7 :14 AM

## 2011-12-02 NOTE — Progress Notes (Signed)
Interim history:  Patient is a 78 y/o AAM with PMH of HTN, DM, HPL, Gout that presented initially with compliants with balance.  Patient initially had a CT and an MRI which were negative.  Patient was subsequently diagnosed with BPV.  But while hospitalized patient's creatinine has been steadily increasing.  Have asked nephrology to see patient and based on their most recent lab work they are not sure of a definitive diagnosis.  Plan will be for pt to get a renal biopsy tomorrow 1/16 for a more definitive diagnosis.  Family is aware and agreeable for diagnosis.  Pt has had SOB for several months now but today 1/15 has reported an increase in SOB.  Echo has been ordered and results are pending.   Subjective: Pt  Is without any complaints.  Family at bedside.   Objective: Filed Vitals:   12/02/11 1638 12/02/11 1643 12/02/11 1720 12/02/11 1915  BP: 132/71 114/94 124/71   Pulse: 66 62 62   Temp:   97.7 F (36.5 C)   TempSrc:   Oral   Resp: 14 17 19    Height:   5\' 9"  (1.753 m) 5\' 9"  (1.753 m)  Weight:    111.3 kg (245 lb 6 oz)  SpO2: 95%  97%    Weight change: 2.5 kg (5 lb 8.2 oz)  Intake/Output Summary (Last 24 hours) at 12/02/11 2135 Last data filed at 12/02/11 1823  Gross per 24 hour  Intake    200 ml  Output    425 ml  Net   -225 ml    General: Alert, awake, oriented x3, in no acute distress.  HEENT: No bruits, no goiter.  Heart: Regular rate and rhythm, without murmurs, rubs, gallops.  Lungs: Decreased breath sounds at the bases, no wheezes or rhales.  Abdomen: Soft, nontender, nondistended, positive bowel sounds.  Neuro: Grossly intact, nonfocal.   Lab Results:  [facility] 12/02/11 0420 12/01/11 0515  NA 133* 133*  K 4.4 4.9  CL 96 96  CO2 25 25  GLUCOSE 256* 352*  BUN 91* 73*  CREATININE 6.60* 6.05*  CALCIUM 8.6 8.5  MG -- --  PHOS 5.0* 5.3*    Basename 12/02/11 0420 12/01/11 0515  AST -- --  ALT -- --  ALKPHOS -- --  BILITOT -- --  PROT -- --  ALBUMIN  2.7* 2.6*   No results found for this basename: LIPASE:2,AMYLASE:2 in the last 72 hours  Basename 11/30/11 2229 11/30/11 1612 11/30/11 0555  WBC -- -- 7.0  NEUTROABS -- -- --  HGB 9.1* 8.1* --  HCT 26.7* 24.2* --  MCV -- -- 77.6*  PLT -- -- 162   No results found for this basename: CKTOTAL:3,CKMB:3,CKMBINDEX:3,TROPONINI:3 in the last 72 hours No components found with this basename: POCBNP:3 No results found for this basename: DDIMER:2 in the last 72 hours No results found for this basename: HGBA1C:2 in the last 72 hours No results found for this basename: CHOL:2,HDL:2,LDLCALC:2,TRIG:2,CHOLHDL:2,LDLDIRECT:2 in the last 72 hours No results found for this basename: TSH,T4TOTAL,FREET3,T3FREE,THYROIDAB in the last 72 hours No results found for this basename: VITAMINB12:2,FOLATE:2,FERRITIN:2,TIBC:2,IRON:2,RETICCTPCT:2 in the last 72 hours  Micro Results: No results found for this or any previous visit (from the past 240 hour(s)).  Studies/Results: US Biopsy  12/02/2011  *RADIOLOGY REPORT*  Clinical Data/Indication: Hypotension.  Elevated creatinine.  ULTRASOUND-GUIDED RANDOM RENAL CORE BIOPSY  Sedation: Versed 2 mg, Fentanyl  mcg.  Total Moderate Sedation Time: 11 minutes.  Procedure: The procedure, risks, benefits, and alternatives were  explained to the patient. Questions regarding the procedure were encouraged and answered. The patient understands and consents to the procedure.  The left low back was prepped with betadine in a sterile fashion, and a sterile drape was applied covering the operative field. A mask and sterile gloves were used for the procedure.  Under sonographic guidance, two 16 gauge core biopsies of the cortex of the lower pole of the left kidney were obtained. Final imaging was performed.  Patient tolerated the procedure well without complication.  Vital sign monitoring by nursing staff during the procedure will continue as patient is in the special procedures unit for post  procedure observation.  Findings: The images document guide needle placement within the lower pole cortex of the left kidney. Post biopsy images demonstrate no evidence of hemorrhage.  IMPRESSION: Successful ultrasound-guided core biopsy of the lower pole of the left kidney.  Original Report Authenticated By: Jamas Lav, M.D.   Ir US Guide Vasc Access Right  12/02/2011  *RADIOLOGY REPORT*  Indication: Renal failure.  PROCEDURE(S): FLUOROSCOPIC AND ULTRASOUND GUIDED PLACEMENT OF A TUNNELED DIAYSIS CATHETER  Physician:  Stephan Minister. Henn, MD  Medications: Versed 1.5 mg, Fentanyl 50 mcg. A radiology nurse monitored the patient for moderate sedation.  As antibiotic prophylaxis, Ancef 1 gm was ordered pre-procedure and administered intravenously within one hour of incision.  Moderate sedation time: 23 minutes  Fluoroscopy time: 0.34minutes  Procedure:Informed consent was obtained for placement of a tunneled dialysis catheter.  The patient was placed supine on the interventional table.  Ultrasound confirmed a patent right internal jugularvein.  Ultrasound images were obtained for documentation. The right neck was prepped and draped in a sterile fashion.  The right neck was anesthetized with 1% lidocaine.  Maximal barrier sterile technique was utilized including caps, mask, sterile gowns, sterile gloves, sterile drape, hand hygiene and skin antiseptic.  A small incision was made with #11 blade scalpel.  A 21 gauge needle directed into the right internal jugular vein with ultrasound guidance.  A micropuncture dilator set was placed.  A 23 cm tip to cuff HemoSplit catheter was selected.  The skin below the right clavicle was anesthetized and a small incision was made with a #11 blade scalpel.  A subcutaneous tunnel was formed to the vein dermatotomy site.  The catheter was brought through the tunnel. The vein dermatotomy site was dilated to accommodate a peel-away sheath.  The catheter was placed through the peel-away  sheath and directed into the central venous structures.  The tip of the catheter was placed at the cavoatrial junction with fluoroscopy. Fluoroscopic images were obtained for documentation.  Both lumens were found to aspirate and flush well.  The proper amount of heparin was flushed in both lumens.  The vein dermatotomy site was closed using a single layer of absorbable suture and Dermabond. The catheter was secured to the skin using Prolene suture.  Findings:Catheter tip at the cavoatrial junction.  Complications: None  Impression:Successful placement of a right chest tunneled dialysis catheter using ultrasound and fluoroscopic guidance.  Original Report Authenticated By: Markus Daft, M.D.   Ir Central Line Right  12/02/2011  *RADIOLOGY REPORT*  Indication: Renal failure.  PROCEDURE(S): FLUOROSCOPIC AND ULTRASOUND GUIDED PLACEMENT OF A TUNNELED DIAYSIS CATHETER  Physician:  Stephan Minister. Henn, MD  Medications: Versed 1.5 mg, Fentanyl 50 mcg. A radiology nurse monitored the patient for moderate sedation.  As antibiotic prophylaxis, Ancef 1 gm was ordered pre-procedure and administered intravenously within one hour of incision.  Moderate  sedation time: 23 minutes  Fluoroscopy time: 0.80minutes  Procedure:Informed consent was obtained for placement of a tunneled dialysis catheter.  The patient was placed supine on the interventional table.  Ultrasound confirmed a patent right internal jugularvein.  Ultrasound images were obtained for documentation. The right neck was prepped and draped in a sterile fashion.  The right neck was anesthetized with 1% lidocaine.  Maximal barrier sterile technique was utilized including caps, mask, sterile gowns, sterile gloves, sterile drape, hand hygiene and skin antiseptic.  A small incision was made with #11 blade scalpel.  A 21 gauge needle directed into the right internal jugular vein with ultrasound guidance.  A micropuncture dilator set was placed.  A 23 cm tip to cuff HemoSplit catheter  was selected.  The skin below the right clavicle was anesthetized and a small incision was made with a #11 blade scalpel.  A subcutaneous tunnel was formed to the vein dermatotomy site.  The catheter was brought through the tunnel. The vein dermatotomy site was dilated to accommodate a peel-away sheath.  The catheter was placed through the peel-away sheath and directed into the central venous structures.  The tip of the catheter was placed at the cavoatrial junction with fluoroscopy. Fluoroscopic images were obtained for documentation.  Both lumens were found to aspirate and flush well.  The proper amount of heparin was flushed in both lumens.  The vein dermatotomy site was closed using a single layer of absorbable suture and Dermabond. The catheter was secured to the skin using Prolene suture.  Findings:Catheter tip at the cavoatrial junction.  Complications: None  Impression:Successful placement of a right chest tunneled dialysis catheter using ultrasound and fluoroscopic guidance.  Original Report Authenticated By: Markus Daft, M.D.   2D Echo report reviewed.  Medications: I have reviewed the patient's current medications.    Renal failure (11/23/2011)  Biopsy was attempted 1/16 this was unsuccessful. Patient has a perinephric hematoma at this time. Biopsy successful  On 1/17   per interventional radiology.  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  Patient on high dose steroid.  Renal will be starting Cytoxan and possible plasmapharesis.  Diabetes mellitus type II (11/23/2011) Blood sugars elevated secondary to high dose steroids.  Increased Lantus and continue SSI.   Will slowly adjust as steroid dose will be changing.  Hypercholesterolemia () Stable   Hypertension (11/23/2011) Blood pressure is stable  Will plan on continuing to monitor  Benign positional vertigo (11/28/2011) Meclizine on board and patient condition improving.  Will most  likely need rehab as outpatient with therapy pending clinical improvement.  Anemia:  We'll continue to monitor hemoglobin.  Disposition:  Patient not yet ready for discharge.  Will be discharged to [facility]     LOS: 9 days   Rosana Hoes MD, Sharlet Salina M.D.  [facility] 12/02/2011, 9:35 PM

## 2011-12-02 NOTE — Procedures (Signed)
Placement of 23 cm Hemosplit catheter in right IJ.  Tip at cavoatrial junction.  No immediate complication.

## 2011-12-02 NOTE — H&P (Signed)
Ryan Jimenez is an 78 y.o. male.   Chief Complaint: acute on chronic renal isufficiency; worsening bun/cr; HTN  HPI: scheduled for permanent catheter placement per Dr Posey Pronto. Pt may need plasma pheresis and probable dialysis  Past Medical History  Diagnosis Date  . Hypertension   . Diabetes mellitus   . Hypercholesterolemia   . Gout   . Shortness of breath on exertion   . Bronchitis   . Arthritis   . Near syncope 10/2011; 11/2011    Past Surgical History  Procedure Date  . Hydrocele excision   . Cataract extraction w/ intraocular lens implant ~ 2009    right    Family History  Problem Relation Age of Onset  . Hypertension Mother   . Heart disease Mother   . Diabetes Sister    Social History:  reports that he has quit smoking. His smoking use included Cigarettes. He has a 40 pack-year smoking history. He has never used smokeless tobacco. He reports that he drinks alcohol. He reports that he does not use illicit drugs.  Allergies: No Known Allergies  Medications Prior to Admission  Medication Dose Route Frequency Provider Last Rate Last Dose  . 0.9 %  sodium chloride infusion   Intravenous Continuous Toy Baker, MD 75 mL/hr at 11/23/11 2330    . 0.9 %  sodium chloride infusion   Intravenous Continuous PRN Lestine Box, PHD 50 mL/hr at 11/30/11 0930 50 mL/hr at 11/30/11 0930  . acetaminophen (TYLENOL) tablet 650 mg  650 mg Oral Q6H PRN Toy Baker, MD       Or  . acetaminophen (TYLENOL) suppository 650 mg  650 mg Rectal Q6H PRN Toy Baker, MD      . acetaminophen (TYLENOL) tablet 650 mg  650 mg Oral Q4H PRN Jay K. Posey Pronto, MD      . albumin human 50 g in sodium chloride 0.9 %   Dialysis Once in dialysis Jay K. Posey Pronto, MD      . albuterol (PROVENTIL) (5 MG/ML) 0.5% nebulizer solution 2.5 mg  2.5 mg Nebulization Q2H PRN Toy Baker, MD   2.5 mg at 11/29/11 1448  . alum & mag hydroxide-simeth (MAALOX/MYLANTA) 200-200-20 MG/5ML  suspension 30 mL  30 mL Oral Q6H PRN Toy Baker, MD      . amLODipine (NORVASC) tablet 10 mg  10 mg Oral Daily Josalyn Funches, MD   10 mg at 12/02/11 1044  . calcium carbonate (TUMS - dosed in mg elemental calcium) chewable tablet 400 mg of elemental calcium  2 tablet Oral PRN Ulice Dash K. Posey Pronto, MD      . calcium carbonate (TUMS - dosed in mg elemental calcium) chewable tablet 400 mg of elemental calcium  2 tablet Oral Q3H Jay K. Posey Pronto, MD      . calcium gluconate 2 g in sodium chloride 0.9 % 100 mL IVPB  2 g Intravenous PRN Ulice Dash K. Posey Pronto, MD       Or  . calcium gluconate 4 g in sodium chloride 0.9 % 250 mL IVPB  4 g Intravenous PRN Ulice Dash K. Posey Pronto, MD       Or  . calcium gluconate injection - for URGENT use only!  2 g Intravenous PRN Ulice Dash K. Posey Pronto, MD      . carvedilol (COREG) tablet 6.25 mg  6.25 mg Oral BID WC Velvet Bathe, MD   6.25 mg at 12/02/11 0833  . citrate dextrose (ACD-A anticoagulant) solution 500 mL  500 mL Intravenous Continuous Ulice Dash  Keith Rake, MD      . cyclophosphamide (CYTOXAN) tablet 200 mg  200 mg Oral Daily Jay K. Posey Pronto, MD      . dextrose (GLUTOSE) 40 % oral gel 37.5 g  1 Tube Oral PRN Toy Baker, MD      . diphenhydrAMINE (BENADRYL) capsule 25 mg  25 mg Oral Q6H PRN Ulice Dash K. Posey Pronto, MD      . dutasteride (AVODART) capsule 0.5 mg  0.5 mg Oral Daily Toy Baker, MD   0.5 mg at 12/02/11 1044  . fentaNYL (SUBLIMAZE) 0.05 MG/ML injection           . fentaNYL (SUBLIMAZE) 0.05 MG/ML injection           . fentaNYL (SUBLIMAZE) injection   Intravenous PRN Lestine Box, PHD   25 mcg at 11/30/11 [id number]  . fentaNYL (SUBLIMAZE) injection   Intravenous PRN Art A Hoss, MD   25 mcg at 12/01/11 1506  . furosemide (LASIX) injection 40 mg  40 mg Intravenous Once Velvet Bathe, MD   40 mg at 11/26/11 2031  . glucose-Vitamin C 4-0.006 GM per chewable tablet 4 tablet  4 tablet Oral PRN Toy Baker, MD      . guaiFENesin-dextromethorphan (ROBITUSSIN DM) 100-10 MG/5ML  syrup 5 mL  5 mL Oral Q4H PRN Toy Baker, MD      . heparin injection 1,000 Units  1,000 Units Intracatheter Once Jay K. Posey Pronto, MD      . hydrALAZINE (APRESOLINE) tablet 100 mg  100 mg Oral BID Toy Baker, MD   100 mg at 12/02/11 1044  . influenza  inactive virus vaccine (FLUZONE/FLUARIX) injection 0.5 mL  0.5 mL Intramuscular Tomorrow-1000 Toy Baker, MD   0.5 mL at 11/24/11 1044  . insulin aspart (novoLOG) injection 0-9 Units  0-9 Units Subcutaneous TID AC & HS Forrest Moron, NP   2 Units at 12/02/11 [id number]  . insulin aspart (novoLOG) injection 12 Units  12 Units Subcutaneous Once Forrest Moron, NP   12 Units at 12/01/11 2215  . insulin glargine (LANTUS) injection 10 Units  10 Units Subcutaneous QHS Novlet Jarrett-Davis, MD   10 Units at 12/01/11 2249  . ketotifen (ZADITOR) 0.025 % ophthalmic solution 1 drop  1 drop Both Eyes BID Velvet Bathe, MD   1 drop at 12/02/11 1048  . meclizine (ANTIVERT) tablet 25 mg  25 mg Oral TID PRN Toy Baker, MD   25 mg at 11/24/11 1039  . menthol-cetylpyridinium (CEPACOL) lozenge 3 mg  1 lozenge Oral PRN Velvet Bathe, MD      . methylPREDNISolone sodium succinate (SOLU-MEDROL) 500 mg in sodium chloride 0.9 % 50 mL IVPB  500 mg Intravenous Daily Jay K. Posey Pronto, MD   500 mg at 12/01/11 1000  . midazolam (VERSED) 2 MG/2ML injection           . midazolam (VERSED) 2 MG/2ML injection           . midazolam (VERSED) 5 MG/5ML injection   Intravenous PRN Lestine Box, PHD   0.5 mg at 11/30/11 1002  . midazolam (VERSED) 5 MG/5ML injection   Intravenous PRN Art A Hoss, MD   2 mg at 12/01/11 1500  . ondansetron (ZOFRAN) tablet 4 mg  4 mg Oral Q6H PRN Toy Baker, MD       Or  . ondansetron (ZOFRAN) injection 4 mg  4 mg Intravenous Q6H PRN Toy Baker, MD      . pneumococcal 23 valent vaccine (PNU-IMMUNE)  injection 0.5 mL  0.5 mL Intramuscular Tomorrow-1000 Toy Baker, MD   0.5 mL at 11/24/11 1040  . potassium chloride  SA (K-DUR,KLOR-CON) CR tablet 40 mEq  40 mEq Oral BID Boykin Nearing, MD   40 mEq at 11/25/11 1007  . predniSONE (DELTASONE) tablet 60 mg  60 mg Oral Q breakfast Jay K. Posey Pronto, MD      . Tamsulosin HCl [facility]) capsule 0.4 mg  0.4 mg Oral Daily Toy Baker, MD   0.4 mg at 12/02/11 1044  . terazosin (HYTRIN) capsule 5 mg  5 mg Oral Daily Toy Baker, MD   5 mg at 12/02/11 1044  . Vitamin D (Ergocalciferol) (DRISDOL) capsule 50,000 Units  50,000 Units Oral Q7 days Luis Abed, MD      . white petrolatum (VASELINE) gel           . DISCONTD: 0.45 % sodium chloride infusion   Intravenous Continuous Boykin Nearing, MD 100 mL/hr at 11/26/11 [id number]    . DISCONTD: 0.9 %  sodium chloride infusion   Intravenous Continuous Josalyn Funches, MD 100 mL/hr at 11/24/11 1649 1,000 mL at 11/24/11 1649  . DISCONTD: acetic acid-hydrocortisone (VOSOL-HC) otic solution 3 drop  3 drop Right Ear TID PRN Boykin Nearing, MD      . DISCONTD: acetic acid-hydrocortisone (VOSOL-HC) otic solution 3 drop  3 drop Left Ear TID PRN Boykin Nearing, MD      . DISCONTD: allopurinol (ZYLOPRIM) tablet 100 mg  100 mg Oral Daily Toy Baker, MD   100 mg at 11/27/11 0947  . DISCONTD: alteplase (ACTIVASE) injection 2 mg  2 mg Intracatheter Once Sandi Mariscal, MD      . DISCONTD: amLODipine (NORVASC) tablet 5 mg  5 mg Oral Daily Velvet Bathe, MD   5 mg at 11/24/11 1242  . DISCONTD: aspirin EC tablet 81 mg  81 mg Oral Daily Toy Baker, MD   81 mg at 11/29/11 1005  . DISCONTD: carvedilol (COREG) tablet 3.125 mg  3.125 mg Oral BID WC Toy Baker, MD   3.125 mg at 11/26/11 0758  . DISCONTD: insulin aspart (novoLOG) injection 0-9 Units  0-9 Units Subcutaneous Q4H Toy Baker, MD   2 Units at 11/28/11 1255  . DISCONTD: insulin aspart (novoLOG) injection 0-9 Units  0-9 Units Subcutaneous TID WC Velvet Bathe, MD   7 Units at 12/01/11 1745  . DISCONTD: meclizine (ANTIVERT) tablet 12.5 mg  12.5 mg Oral BID Velvet Bathe, MD   12.5 mg at 11/27/11 0947  . DISCONTD: potassium chloride SA (K-DUR,KLOR-CON) CR tablet 40 mEq  40 mEq Oral BID Boykin Nearing, MD   40 mEq at 11/27/11 2359   Medications Prior to Admission  Medication Sig Dispense Refill  . allopurinol (ZYLOPRIM) 100 MG tablet Take 100 mg by mouth daily.       [name] aspirin 81 MG EC tablet Take 81 mg by mouth daily. Swallow whole.      . carvedilol (COREG) 3.125 MG tablet Take 3.125 mg by mouth 2 (two) times daily with a meal.       . colchicine 0.6 MG tablet Take 0.6 mg by mouth 2 (two) times daily as needed. For gout      . dutasteride (AVODART) 0.5 MG capsule Take 0.5 mg by mouth daily.       [name] glimepiride (AMARYL) 4 MG tablet Take 4 mg by mouth daily as needed. WHEN BLOOD SUGAR RUNS TOO HIGH      . hydrALAZINE (APRESOLINE) 100 MG tablet  Take 100 mg by mouth 2 (two) times daily.        . naproxen sodium (ANAPROX) 220 MG tablet Take 220 mg by mouth 2 (two) times daily with a meal.        . Tamsulosin HCl (FLOMAX) 0.4 MG CAPS Take 0.4 mg by mouth daily.        [name] terazosin (HYTRIN) 5 MG capsule Take 5 mg by mouth daily.       . valsartan-hydrochlorothiazide (DIOVAN-HCT) 320-25 MG per tablet Take 1 tablet by mouth daily.        . Vitamin D, Ergocalciferol, (DRISDOL) 50000 UNITS CAPS Take 50,000 Units by mouth every 7 (seven) days. Take on sundays         Results for orders placed during the hospital encounter of 11/23/11 (from the past 48 hour(s))  HEMOGLOBIN AND HEMATOCRIT, BLOOD     Status: Abnormal   Collection Time   11/30/11  4:12 PM      Component Value Range Comment   Hemoglobin 8.1 (*) 13.0 - 17.0 (g/dL)    HCT 24.2 (*) 39.0 - 52.0 (%)   HEPATITIS PANEL, ACUTE     Status: Normal (Preliminary result)   Collection Time   11/30/11  4:12 PM      Component Value Range Comment   Hepatitis B Surface Ag NEGATIVE  NEGATIVE     HCV Ab NEGATIVE  NEGATIVE     Hep A IgM PENDING  NEGATIVE     Hep B C IgM PENDING  NEGATIVE    GLUCOSE, CAPILLARY      Status: Abnormal   Collection Time   11/30/11  5:06 PM      Component Value Range Comment   Glucose-Capillary 193 (*) 70 - 99 (mg/dL)   GLUCOSE, CAPILLARY     Status: Abnormal   Collection Time   11/30/11  7:15 PM      Component Value Range Comment   Glucose-Capillary 218 (*) 70 - 99 (mg/dL)   GLUCOSE, CAPILLARY     Status: Abnormal   Collection Time   11/30/11  9:58 PM      Component Value Range Comment   Glucose-Capillary 220 (*) 70 - 99 (mg/dL)   HEMOGLOBIN AND HEMATOCRIT, BLOOD     Status: Abnormal   Collection Time   11/30/11 10:29 PM      Component Value Range Comment   Hemoglobin 9.1 (*) 13.0 - 17.0 (g/dL)    HCT 26.7 (*) 39.0 - 52.0 (%)   RENAL FUNCTION PANEL     Status: Abnormal   Collection Time   12/01/11  5:15 AM      Component Value Range Comment   Sodium 133 (*) 135 - 145 (mEq/L)    Potassium 4.9  3.5 - 5.1 (mEq/L) SLIGHT HEMOLYSIS   Chloride 96  96 - 112 (mEq/L)    CO2 25  19 - 32 (mEq/L)    Glucose, Bld 352 (*) 70 - 99 (mg/dL)    BUN 73 (*) 6 - 23 (mg/dL)    Creatinine, Ser 6.05 (*) 0.50 - 1.35 (mg/dL)    Calcium 8.5  8.4 - 10.5 (mg/dL)    Phosphorus 5.3 (*) 2.3 - 4.6 (mg/dL)    Albumin 2.6 (*) 3.5 - 5.2 (g/dL)    GFR calc non Af Amer 8 (*) >90 (mL/min)    GFR calc Af Amer 9 (*) >90 (mL/min)   GLUCOSE, CAPILLARY     Status: Abnormal   Collection Time   12/01/11  7:31 AM      Component Value Range Comment   Glucose-Capillary 320 (*) 70 - 99 (mg/dL)   GLUCOSE, CAPILLARY     Status: Abnormal   Collection Time   12/01/11 11:47 AM      Component Value Range Comment   Glucose-Capillary 273 (*) 70 - 99 (mg/dL)   GLUCOSE, CAPILLARY     Status: Abnormal   Collection Time   12/01/11  5:06 PM      Component Value Range Comment   Glucose-Capillary 305 (*) 70 - 99 (mg/dL)   GLUCOSE, CAPILLARY     Status: Abnormal   Collection Time   12/01/11  9:26 PM      Component Value Range Comment   Glucose-Capillary 408 (*) 70 - 99 (mg/dL)    Comment 1 Notify RN     RENAL  FUNCTION PANEL     Status: Abnormal   Collection Time   12/02/11  4:20 AM      Component Value Range Comment   Sodium 133 (*) 135 - 145 (mEq/L)    Potassium 4.4  3.5 - 5.1 (mEq/L)    Chloride 96  96 - 112 (mEq/L)    CO2 25  19 - 32 (mEq/L)    Glucose, Bld 256 (*) 70 - 99 (mg/dL)    BUN 91 (*) 6 - 23 (mg/dL)    Creatinine, Ser 6.60 (*) 0.50 - 1.35 (mg/dL)    Calcium 8.6  8.4 - 10.5 (mg/dL)    Phosphorus 5.0 (*) 2.3 - 4.6 (mg/dL)    Albumin 2.7 (*) 3.5 - 5.2 (g/dL)    GFR calc non Af Amer 7 (*) >90 (mL/min)    GFR calc Af Amer 8 (*) >90 (mL/min)   GLUCOSE, CAPILLARY     Status: Abnormal   Collection Time   12/02/11  7:35 AM      Component Value Range Comment   Glucose-Capillary 195 (*) 70 - 99 (mg/dL)    Comment 1 Documented in Chart      Comment 2 Notify RN     GLUCOSE, CAPILLARY     Status: Abnormal   Collection Time   12/02/11 11:47 AM      Component Value Range Comment   Glucose-Capillary 337 (*) 70 - 99 (mg/dL)    US Guided Needle Placement  11/30/2011  *RADIOLOGY REPORT*  Indication: Acute on chronic renal insufficiency.  ULTRASOUND GUIDED RENAL BIOPSY  Comparison: Renal ultrasound - 11/24/2011; CTA of the abdomen pelvis - 10/14/2008  Medications: Conscious sedation administered by nursing.  Total Moderate Sedation time: 40 minutes  Complications: The patient developed small perinephric hematoma after the second attempted biopsy, however remained asymptomatic (denied flank pain) and remained hemodynamically stable.  Procedure:  Informed written consent was obtained from the patient after a discussion of the risks, benefits and alternatives to treatment. The patient understands and consents to the procedure.  A timeout was performed prior to the initiation of the procedure.  Ultrasound scanning was performed of the left kidney, however demonstrated relatively poor sonographic window, enlarged part to the patient's body habitus and respiratory rate.  Ultrasound scanning was performed of  the right kidney demonstrating an adequate sonographic window and as such, the procedure was planned.  The operative site was prepped with betadine and draped in the usual sterile fashion.  The overlying soft tissues were anesthetized with 1% lidocaine with epinephrine.  A 17 gauge core needle biopsy device was advanced into the inferior cortex of the left kidney and  2 core biopsies were obtained under direct ultrasound guidance.  Images were saved for documentation purposes.  Review of the obtained biopsy samples failed to demonstrate adequate tissue acquisition.  Repeat ultrasound scanning demonstrated a small right-sided perinephric hematoma, and while the patient remained asymptomatic (denied flank pain and remained hemodynamically stable), the decision was made to not to attempt an additional biopsy largely in part due to now poor sonographic window.  At this point, the procedure was terminated.  A dressing was placed.  The patient tolerated the procedure well without immediate post procedural complication.  The minimal amount of acquired tissue was reviewed with pathology and deemed not substantial enough for submission.  Impression:  Unsuccessful attempted ultrasound guided right renal biopsy. Future attempts may be performed under CT guidance as clinically indicated.  Above discussed with Dr. Rosana Hoes at the time of procedure completion.  Original Report Authenticated By: Rachel Moulds, M.D.   US Biopsy  12/02/2011  *RADIOLOGY REPORT*  Clinical Data/Indication: Hypotension.  Elevated creatinine.  ULTRASOUND-GUIDED RANDOM RENAL CORE BIOPSY  Sedation: Versed 2 mg, Fentanyl  mcg.  Total Moderate Sedation Time: 11 minutes.  Procedure: The procedure, risks, benefits, and alternatives were explained to the patient. Questions regarding the procedure were encouraged and answered. The patient understands and consents to the procedure.  The left low back was prepped with betadine in a sterile fashion, and a sterile  drape was applied covering the operative field. A mask and sterile gloves were used for the procedure.  Under sonographic guidance, two 16 gauge core biopsies of the cortex of the lower pole of the left kidney were obtained. Final imaging was performed.  Patient tolerated the procedure well without complication.  Vital sign monitoring by nursing staff during the procedure will continue as patient is in the special procedures unit for post procedure observation.  Findings: The images document guide needle placement within the lower pole cortex of the left kidney. Post biopsy images demonstrate no evidence of hemorrhage.  IMPRESSION: Successful ultrasound-guided core biopsy of the lower pole of the left kidney.  Original Report Authenticated By: Jamas Lav, M.D.    ROS  Blood pressure 122/71, pulse 62, temperature 98.1 F (36.7 C), temperature source Oral, resp. rate 20, height 5\' 9"  (1.753 m), weight 245 lb 2.4 oz (111.2 kg), SpO2 94.00%. Physical Exam   Assessment/Plan Worsening renal failure; HTN. Dr Posey Pronto has ordered permanent catheter for plasma pheresis and possible dialysis. Will consent pts daughter; not able to get over phone. RN to get consent  TURPIN,PAMELA A 12/02/2011, 12:47 PM

## 2011-12-02 NOTE — Progress Notes (Signed)
Inpatient Diabetes Program Recommendations  AACE/ADA: New Consensus Statement on Inpatient Glycemic Control (2009)  Target Ranges:  Prepandial:   less than 140 mg/dL      Peak postprandial:   less than 180 mg/dL (1-2 hours)      Critically ill patients:  140 - 180 mg/dL   CBGs 01/17: 320/ 273/ 305/ 408 mg/dl CBGs today: 195/ 337 mg/dl   Inpatient Diabetes Program Recommendations Insulin - Basal: Please titrate Lantus as needed.  Noted 10 units Lantus QHS started last evening. Correction (SSI): Please increase Novolog SSI to Resistant scale tid ac + HS.  Noted IV Solumedrol to stop tomorrow morning and pt to be started on PO Prednisone.  Will follow.

## 2011-12-02 NOTE — Progress Notes (Signed)
Clinical Social Worker met with pt at bedside and provided SNF bed offers. Clinical Social Worker provided pt children excuse letter for work at request of pt daughters. Clinical Social Worker left contact information on bed offers for pt family to contact this Holiday representative with any question in regard to bed offers. Clinical Social Worker to follow up with pt in regard to decision for SNF and facilitate pt discharge needs when pt medically ready for discharge.  Drake Leach, MSW, [place] Social Work [phone]

## 2011-12-02 NOTE — Progress Notes (Addendum)
I have seen and examined this patient and agree with the assessment/plan as outlined above by [facility] MD. Continue to have worsening renal function although still non-oliguric and without acute dialysis needs. Plan to continue monitoring as we await on renal biopsy result s/p solumedrol...Marland KitchenMarland KitchenHigh risk for needing HD in the near future. 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 Continue prednisone after Methypred stopped. Elmarie Shiley MD [facility]. Office # [phone] Pager # [phone] 10:57 AM

## 2011-12-03 LAB — RENAL FUNCTION PANEL
Albumin: 3.2 g/dL — ABNORMAL LOW (ref 3.5–5.2)
BUN: 107 mg/dL — ABNORMAL HIGH (ref 6–23)
CO2: 27 mEq/L (ref 19–32)
Calcium: 9.2 mg/dL (ref 8.4–10.5)
Chloride: 93 mEq/L — ABNORMAL LOW (ref 96–112)
Creatinine, Ser: 6.48 mg/dL — ABNORMAL HIGH (ref 0.50–1.35)
GFR calc Af Amer: 9 mL/min — ABNORMAL LOW (ref >90)
GFR calc non Af Amer: 7 mL/min — ABNORMAL LOW (ref >90)
Glucose, Bld: 384 mg/dL — ABNORMAL HIGH (ref 70–99)
Phosphorus: 7 mg/dL — ABNORMAL HIGH (ref 2.3–4.6)
Potassium: 4.2 mEq/L (ref 3.5–5.1)
Sodium: 136 mEq/L (ref 135–145)

## 2011-12-03 LAB — GLUCOSE, CAPILLARY
Glucose-Capillary: 399 mg/dL — ABNORMAL HIGH (ref 70–99)
Glucose-Capillary: 430 mg/dL — ABNORMAL HIGH (ref 70–99)
Glucose-Capillary: 363 mg/dL — ABNORMAL HIGH (ref 70–99)
Glucose-Capillary: 345 mg/dL — ABNORMAL HIGH (ref 70–99)

## 2011-12-03 LAB — CBC
HCT: 22.5 % — ABNORMAL LOW (ref 39.0–52.0)
Hemoglobin: 7.6 g/dL — ABNORMAL LOW (ref 13.0–17.0)
MCV: 76.8 fL — ABNORMAL LOW (ref 78.0–100.0)
RBC: 2.93 MIL/uL — ABNORMAL LOW (ref 4.22–5.81)
WBC: 9.8 10*3/uL (ref 4.0–10.5)

## 2011-12-03 MED ORDER — CALCIUM GLUCONATE 10 % IV SOLN
2.0000 g | Freq: Once | INTRAVENOUS | Status: DC
  Filled 2011-12-03: qty 20

## 2011-12-03 MED ORDER — CALCIUM CARBONATE ANTACID 500 MG PO CHEW
CHEWABLE_TABLET | ORAL | Status: AC
  Administered 2011-12-03: 1000 mg via ORAL
  Filled 2011-12-03: qty 2

## 2011-12-03 MED ORDER — INSULIN GLARGINE 100 UNIT/ML ~~LOC~~ SOLN
18.0000 [IU] | Freq: Every day | SUBCUTANEOUS | Status: DC
  Administered 2011-12-03: 18 [IU] via SUBCUTANEOUS

## 2011-12-03 MED ORDER — DIPHENHYDRAMINE HCL 25 MG PO CAPS
ORAL_CAPSULE | ORAL | Status: AC
  Administered 2011-12-03: 25 mg via ORAL
  Filled 2011-12-03: qty 1

## 2011-12-03 MED ORDER — ACETAMINOPHEN 325 MG PO TABS: 650.0000 mg | ORAL_TABLET | ORAL | Status: DC | PRN

## 2011-12-03 MED ORDER — ACD FORMULA A 0.73-2.45-2.2 GM/100ML VI SOLN
500.0000 mL | Status: DC
  Administered 2011-12-09: 500 mL via INTRAVENOUS
  Filled 2011-12-03 (×4): qty 500

## 2011-12-03 MED ORDER — CALCIUM GLUCONATE 10 % IV SOLN
INTRAVENOUS | Status: AC
  Administered 2011-12-03: 2 g via INTRAVENOUS
  Filled 2011-12-03: qty 20

## 2011-12-03 MED ORDER — DIPHENHYDRAMINE HCL 25 MG PO CAPS: 25.0000 mg | ORAL_CAPSULE | Freq: Four times a day (QID) | ORAL | Status: DC | PRN

## 2011-12-03 MED ORDER — SODIUM CHLORIDE 0.9 % IV SOLN: 4.0000 g | INTRAVENOUS | Status: DC | PRN

## 2011-12-03 MED ORDER — SEVELAMER CARBONATE 800 MG PO TABS
800.0000 mg | ORAL_TABLET | Freq: Three times a day (TID) | ORAL | Status: DC
  Administered 2011-12-03 – 2011-12-17 (×36): 800 mg via ORAL
  Filled 2011-12-03 (×48): qty 1

## 2011-12-03 MED ORDER — SODIUM CHLORIDE 0.9 % IV SOLN: 2.0000 g | INTRAVENOUS | Status: DC | PRN

## 2011-12-03 MED ORDER — CALCIUM GLUCONATE 10 % IV SOLN: 2.0000 g | INTRAVENOUS | Status: DC | PRN

## 2011-12-03 MED ORDER — HEPARIN SODIUM (PORCINE) 1000 UNIT/ML IJ SOLN
1000.0000 [IU] | Freq: Once | INTRAMUSCULAR | Status: DC
  Filled 2011-12-03: qty 1

## 2011-12-03 MED ORDER — CALCIUM CARBONATE ANTACID 500 MG PO CHEW: 2.0000 | CHEWABLE_TABLET | ORAL | Status: DC | PRN

## 2011-12-03 NOTE — Progress Notes (Signed)
Subjective: No complaints. Received hemodialysis overnight. Renal biopsy pending.  Objective: Vital signs in last 24 hours: Temp:  [96.5 F (35.8 C)-97.9 F (36.6 C)] 97.9 F (36.6 C) (01/19 0940) Pulse Rate:  [61-70] 63  (01/19 0940) Resp:  [14-20] 19  (01/19 0940) BP: (114-166)/(58-94) 152/71 mmHg (01/19 0940) SpO2:  [92 %-97 %] 96 % (01/19 0940) Weight:  [111.3 kg (245 lb 6 oz)] 111.3 kg (245 lb 6 oz) (01/18 1915) Weight change: 0.1 kg (3.5 oz) Last BM Date: 11/28/11  Intake/Output from previous day: 01/18 0701 - 01/19 0700 In: 320 [P.O.:320] Out: 475 [Urine:475] Total I/O In: 240 [P.O.:240] Out: 400 [Urine:400]   Physical Exam: General: Alert, awake, oriented x3, in no acute distress. HEENT: No bruits, no goiter. Heart: Regular rate and rhythm, without murmurs, rubs, gallops. Lungs: Clear to auscultation bilaterally. Abdomen: Soft, nontender, nondistended, positive bowel sounds. Extremities: No clubbing cyanosis or edema with positive pedal pulses. Neuro: Grossly intact, nonfocal.    Lab Results: Basic Metabolic Panel:  Basename 12/03/11 0825 12/02/11 0420  NA 136 133*  K 4.2 4.4  CL 93* 96  CO2 27 25  GLUCOSE 384* 256*  BUN 107* 91*  CREATININE 6.48* 6.60*  CALCIUM 9.2 8.6  MG -- --  PHOS 7.0* 5.0*   Liver Function Tests:  Basename 12/03/11 0825 12/02/11 0420  AST -- --  ALT -- --  ALKPHOS -- --  BILITOT -- --  PROT -- --  ALBUMIN 3.2* 2.7*   CBC:  Basename 12/03/11 0825 11/30/11 2229  WBC 9.8 --  NEUTROABS -- --  HGB 7.6* 9.1*  HCT 22.5* 26.7*  MCV 76.8* --  PLT 157 --   CBG:  Basename 12/02/11 2144 12/02/11 1719 12/02/11 1147 12/02/11 0735 12/01/11 2126 12/01/11 1706  GLUCAP 380* 315* 337* 195* 408* 305*   Studies/Results: US Biopsy  12/02/2011  *RADIOLOGY REPORT*  Clinical Data/Indication: Hypotension.  Elevated creatinine.  ULTRASOUND-GUIDED RANDOM RENAL CORE BIOPSY  Sedation: Versed 2 mg, Fentanyl  mcg.  Total Moderate Sedation  Time: 11 minutes.  Procedure: The procedure, risks, benefits, and alternatives were explained to the patient. Questions regarding the procedure were encouraged and answered. The patient understands and consents to the procedure.  The left low back was prepped with betadine in a sterile fashion, and a sterile drape was applied covering the operative field. A mask and sterile gloves were used for the procedure.  Under sonographic guidance, two 16 gauge core biopsies of the cortex of the lower pole of the left kidney were obtained. Final imaging was performed.  Patient tolerated the procedure well without complication.  Vital sign monitoring by nursing staff during the procedure will continue as patient is in the special procedures unit for post procedure observation.  Findings: The images document guide needle placement within the lower pole cortex of the left kidney. Post biopsy images demonstrate no evidence of hemorrhage.  IMPRESSION: Successful ultrasound-guided core biopsy of the lower pole of the left kidney.  Original Report Authenticated By: Jamas Lav, M.D.   Ir US Guide Vasc Access Right  12/02/2011  *RADIOLOGY REPORT*  Indication: Renal failure.  PROCEDURE(S): FLUOROSCOPIC AND ULTRASOUND GUIDED PLACEMENT OF A TUNNELED DIAYSIS CATHETER  Physician:  Stephan Minister. Henn, MD  Medications: Versed 1.5 mg, Fentanyl 50 mcg. A radiology nurse monitored the patient for moderate sedation.  As antibiotic prophylaxis, Ancef 1 gm was ordered pre-procedure and administered intravenously within one hour of incision.  Moderate sedation time: 23 minutes  Fluoroscopy time: 0.55minutes  Procedure:Informed consent was obtained for placement of a tunneled dialysis catheter.  The patient was placed supine on the interventional table.  Ultrasound confirmed a patent right internal jugularvein.  Ultrasound images were obtained for documentation. The right neck was prepped and draped in a sterile fashion.  The right neck was  anesthetized with 1% lidocaine.  Maximal barrier sterile technique was utilized including caps, mask, sterile gowns, sterile gloves, sterile drape, hand hygiene and skin antiseptic.  A small incision was made with #11 blade scalpel.  A 21 gauge needle directed into the right internal jugular vein with ultrasound guidance.  A micropuncture dilator set was placed.  A 23 cm tip to cuff HemoSplit catheter was selected.  The skin below the right clavicle was anesthetized and a small incision was made with a #11 blade scalpel.  A subcutaneous tunnel was formed to the vein dermatotomy site.  The catheter was brought through the tunnel. The vein dermatotomy site was dilated to accommodate a peel-away sheath.  The catheter was placed through the peel-away sheath and directed into the central venous structures.  The tip of the catheter was placed at the cavoatrial junction with fluoroscopy. Fluoroscopic images were obtained for documentation.  Both lumens were found to aspirate and flush well.  The proper amount of heparin was flushed in both lumens.  The vein dermatotomy site was closed using a single layer of absorbable suture and Dermabond. The catheter was secured to the skin using Prolene suture.  Findings:Catheter tip at the cavoatrial junction.  Complications: None  Impression:Successful placement of a right chest tunneled dialysis catheter using ultrasound and fluoroscopic guidance.  Original Report Authenticated By: Markus Daft, M.D.   Ir Central Line Right  12/02/2011  *RADIOLOGY REPORT*  Indication: Renal failure.  PROCEDURE(S): FLUOROSCOPIC AND ULTRASOUND GUIDED PLACEMENT OF A TUNNELED DIAYSIS CATHETER  Physician:  Stephan Minister. Henn, MD  Medications: Versed 1.5 mg, Fentanyl 50 mcg. A radiology nurse monitored the patient for moderate sedation.  As antibiotic prophylaxis, Ancef 1 gm was ordered pre-procedure and administered intravenously within one hour of incision.  Moderate sedation time: 23 minutes  Fluoroscopy  time: 0.19minutes  Procedure:Informed consent was obtained for placement of a tunneled dialysis catheter.  The patient was placed supine on the interventional table.  Ultrasound confirmed a patent right internal jugularvein.  Ultrasound images were obtained for documentation. The right neck was prepped and draped in a sterile fashion.  The right neck was anesthetized with 1% lidocaine.  Maximal barrier sterile technique was utilized including caps, mask, sterile gowns, sterile gloves, sterile drape, hand hygiene and skin antiseptic.  A small incision was made with #11 blade scalpel.  A 21 gauge needle directed into the right internal jugular vein with ultrasound guidance.  A micropuncture dilator set was placed.  A 23 cm tip to cuff HemoSplit catheter was selected.  The skin below the right clavicle was anesthetized and a small incision was made with a #11 blade scalpel.  A subcutaneous tunnel was formed to the vein dermatotomy site.  The catheter was brought through the tunnel. The vein dermatotomy site was dilated to accommodate a peel-away sheath.  The catheter was placed through the peel-away sheath and directed into the central venous structures.  The tip of the catheter was placed at the cavoatrial junction with fluoroscopy. Fluoroscopic images were obtained for documentation.  Both lumens were found to aspirate and flush well.  The proper amount of heparin was flushed in both lumens.  The vein dermatotomy site  was closed using a single layer of absorbable suture and Dermabond. The catheter was secured to the skin using Prolene suture.  Findings:Catheter tip at the cavoatrial junction.  Complications: None  Impression:Successful placement of a right chest tunneled dialysis catheter using ultrasound and fluoroscopic guidance.  Original Report Authenticated By: Markus Daft, M.D.    Medications: Scheduled Meds:   . therapeutic plasma exchange solution   Dialysis Once in dialysis  . amLODipine  10 mg Oral  Daily  . calcium carbonate  2 tablet Oral Q3H  . carvedilol  6.25 mg Oral BID WC  .  ceFAZolin (ANCEF) IV  1 g Intravenous Once  . citrate dextrose      . cyclophosphamide  200 mg Oral Daily  . dutasteride  0.5 mg Oral Daily  . gelatin adsorbable      . heparin      . heparin  1,000 Units Intracatheter Once  . hydrALAZINE  100 mg Oral BID  . insulin aspart  0-9 Units Subcutaneous TID AC & HS  . insulin glargine  16 Units Subcutaneous QHS  . ketotifen  1 drop Both Eyes BID  . methylPREDNISolone (SOLU-MEDROL) injection  500 mg Intravenous Daily  . predniSONE  60 mg Oral Q breakfast  . Tamsulosin HCl  0.4 mg Oral Daily  . terazosin  5 mg Oral Daily  . Vitamin D (Ergocalciferol)  50,000 Units Oral Q7 days  . DISCONTD: insulin glargine  10 Units Subcutaneous QHS   Continuous Infusions:   . citrate dextrose    . DISCONTD: citrate dextrose     PRN Meds:.acetaminophen, acetaminophen, acetaminophen, albuterol, alum & mag hydroxide-simeth, calcium carbonate, calcium gluconate IV, calcium gluconate IV, calcium gluconate, dextrose, diphenhydrAMINE, fentaNYL, glucose-Vitamin C, guaiFENesin-dextromethorphan, meclizine, menthol-cetylpyridinium, midazolam, ondansetron (ZOFRAN) IV, ondansetron  Assessment/Plan:  Principal Problem:  *Renal failure Active Problems:  Hypercholesterolemia  Hypertension  Diabetes mellitus type II  Benign positional vertigo  #1 Acute renal failure: Workup currently ongoing by nephrology. He is now status post renal biopsy, has been started on steroids and Cytoxan given elevated ANA. Rest of workup pending. Dialysis catheter was placed yesterday he received his first hemodialysis treatment overnight.  #2 type 2 diabetes: CBGs are very elevated secondary to steroids. Will increase Lantus.   LOS: 10 days   [facility] Pager: [phone] 12/03/2011, 10:22 AM

## 2011-12-03 NOTE — Progress Notes (Signed)
Subjective: Interval History:  UO declining daily, only 475 ml in past 24 hours.  Patient had Rt IJ cath placed and plasmapheresis yesterday evening and started on cytoxan. Tired this am after being awake all night for treatment, but tolerated well. Hyperglycemia noted to 380. Still having scant hemoptysis.   Objective:  Vital signs in last 24 hours:  Temp:  [96.5 F (35.8 C)-98.1 F (36.7 C)] 97.3 F (36.3 C) (01/19 0727) Pulse Rate:  [61-70] 65  (01/19 0727) Resp:  [14-20] 19  (01/19 0727) BP: (114-166)/(58-94) 162/68 mmHg (01/19 0727) SpO2:  [92 %-97 %] 92 % (01/19 0727) Weight:  [245 lb 6 oz (111.3 kg)] 245 lb 6 oz (111.3 kg) (01/18 1915) Weight change: 3.5 oz (0.1 kg)  Intake/Output: I/O last 3 completed shifts: In: 320 [P.O.:320] Out: 700 [Urine:700]   Intake/Output Summary (Last 24 hours) at 12/03/11 0859 Last data filed at 12/03/11 0600  Gross per 24 hour  Intake    320 ml  Output    475 ml  Net   -155 ml     EXAM: HEENT: NCAT, NAD. Arouses from sleep easily. RS- Rt basilar rales. Nonlabored.  CV: RRR, no murmurs. ABD- Nontender, BS + EXT- Trace edema, nontender. Left [place] catheter.  NEURO-a/o x3. No asterixis. No gross motor deficits.   Lab Results:  Basename 12/03/11 0825 11/30/11 2229 11/30/11 1612  WBC 9.8 -- --  HGB 7.6* 9.1* 8.1*  HCT 22.5* 26.7* 24.2*  PLT 157 -- --   BMET  Basename 12/03/11 0825 12/02/11 0420 12/01/11 0515  NA 136 133* 133*  K 4.2 4.4 4.9  CL 93* 96 96  CO2 27 25 25   GLUCOSE 384* 256* 352*  BUN 107* 91* 73*  CREATININE 6.48* 6.60* 6.05*  CALCIUM 9.2 8.6 8.5  PHOS 7.0* 5.0* 5.3*   LFT  Basename 12/03/11 0825  PROT --  ALBUMIN 3.2*  AST --  ALT --  ALKPHOS --  BILITOT --  BILIDIR --  IBILI --   PT/INR No results found for this basename: LABPROT:2,INR:2 in the last 72 hours Hepatitis Panel  Basename 12/02/11 1200 11/30/11 1612  HEPBSAG NEGATIVE --  HCVAB -- NEGATIVE  HEPAIGM -- NEGATIVE  HEPBIGM -- NEGATIVE    PTH: Lab Results  Component Value Date   CALCIUM 9.2 12/03/2011   PHOS 7.0* 12/03/2011  Albumin: 2.7  Anemia lab work-up: Iron: 29 Ferritin: 154 Sat: 14 Folate 7.4  B12 456  Renal lab work-up: UNa:  81 [place]: 35 UCl: 36 UCr: 50.35 UPr: 1 UA 1/10:  large RBC's, negative protein, ketones glucose, bilirubin, LE and nirtrites. Micro exam: TNTC RBC's per HPF. UA 1/11: 100 protein, 15 ketones, small bilirubin, negative blood.  C3 129 C4 31 ASO 64 Ratio UPr/Cr: 0.23 24 hr urine protein: 360 mg 24 hr urine creatinine: 1511 SPEP/UPEP- kappa 19.9, lambda 3.59, ratio 5.56, immunofixation pending  ANA- positive, 1:1280 Anti-ds DNA-36 (equivocal) Anti-myeloperoxidate positive (see paper records) HIV-negative   Studies/Results: 2D-ECHO: EF 55-60%, normal wall motion     . therapeutic plasma exchange solution   Dialysis Once in dialysis  . amLODipine  10 mg Oral Daily  . calcium carbonate  2 tablet Oral Q3H  . carvedilol  6.25 mg Oral BID WC  .  ceFAZolin (ANCEF) IV  1 g Intravenous Once  . citrate dextrose      . cyclophosphamide  200 mg Oral Daily  . dutasteride  0.5 mg Oral Daily  . gelatin adsorbable      .  heparin      . heparin  1,000 Units Intracatheter Once  . hydrALAZINE  100 mg Oral BID  . insulin aspart  0-9 Units Subcutaneous TID AC & HS  . insulin glargine  16 Units Subcutaneous QHS  . ketotifen  1 drop Both Eyes BID  . methylPREDNISolone (SOLU-MEDROL) injection  500 mg Intravenous Daily  . predniSONE  60 mg Oral Q breakfast  . Tamsulosin HCl  0.4 mg Oral Daily  . terazosin  5 mg Oral Daily  . Vitamin D (Ergocalciferol)  50,000 Units Oral Q7 days  . DISCONTD: insulin glargine  10 Units Subcutaneous QHS   Assessment/Plan: 78 yo M with uncontrolled hypertension presents with progressive renal decline, +MPO Ab concerning for microscopic polyangiitis.   1. Acute on chronic kidney disease: Baseline Cr is 0.9-1.2 from June 2012.  Creatinine continues to rise at  a rate of  0.3-0.6 mg/dL. BUN rising possibly related to steroids in addition to renal function, will monitor neuro exam for indication of HD, none today. No evidence of fluid overload on exam or CXR.  Proteinuria quantified and not within a nephrotic range. Positive labs include high titer ANA postive and anti-MPO Ab. Pending lab ANCA, anti-GBM. This is concerning for microscopic polyangiitis, pending renal biopsy results. Question if new hemoptysis may indicate pulmonary work up also. Started on solumedrol empirically 11/30/11 transitioned to prednisone today. Cytoxan started 1/18 and plan for PEX treatment again today with likely one more treatment on Monday given his significant declining renal function. Patient now has temp IJ catheter if indication for emergent HD develops. 2. ANEMIA-iron deficiency anemia. Patient received feraheme on 11/26/11. Repeat Hgb stable. Stool cards still pending. Hgb 7.6 after biopsy, possiblydilutional with fluids/PEX. Will trend to assess need for transfusion. 3. HTN/VOL-BP well controlled today,  goal SBP 140-130 on norvasc, coreg, hydralazine, terazosin.  4. Dizziness: Slight interval improvement per pt report. Suspect BPPV. Pt at times better after Dix-Hallpike maneuvers. Management  per primary team.  5. Hyperglycemia. Lantus increased to 18units + SSI. Likely due to steroid, transition to prednisone today.   LOS: 10 KONKOL,JILL 12/03/2011  Patient seen and examined and agree with assessment and plan as above.  Patient tolerated PLEX this am.  Will plan next PLEX on Monday (every other day for two weeks), and continue po cytoxan and po steroids while awaiting results of renal biopsy.  Not uremic at this time, but Bun/Cr are rising and he may require HD soon.   Kelly Splinter, MD [facility] [place] [phone] cell 12/03/2011, 1:33 PM        @8 :56 AM

## 2011-12-04 ENCOUNTER — Inpatient Hospital Stay (HOSPITAL_COMMUNITY): Payer: Medicare Other

## 2011-12-04 LAB — CBC
HCT: 20.8 % — ABNORMAL LOW (ref 39.0–52.0)
Hemoglobin: 7.1 g/dL — ABNORMAL LOW (ref 13.0–17.0)
MCHC: 34.1 g/dL (ref 30.0–36.0)
WBC: 8.9 10*3/uL (ref 4.0–10.5)

## 2011-12-04 LAB — GLUCOSE, CAPILLARY
Glucose-Capillary: 242 mg/dL — ABNORMAL HIGH (ref 70–99)
Glucose-Capillary: 312 mg/dL — ABNORMAL HIGH (ref 70–99)
Glucose-Capillary: 251 mg/dL — ABNORMAL HIGH (ref 70–99)

## 2011-12-04 LAB — RENAL FUNCTION PANEL
BUN: 124 mg/dL — ABNORMAL HIGH (ref 6–23)
CO2: 28 mEq/L (ref 19–32)
Chloride: 97 mEq/L (ref 96–112)
GFR calc Af Amer: 8 mL/min — ABNORMAL LOW (ref >90)
Glucose, Bld: 237 mg/dL — ABNORMAL HIGH (ref 70–99)
Phosphorus: 7.4 mg/dL — ABNORMAL HIGH (ref 2.3–4.6)

## 2011-12-04 LAB — PREPARE RBC (CROSSMATCH)

## 2011-12-04 MED ORDER — SODIUM CHLORIDE 0.9 % IV SOLN: Freq: Once | INTRAVENOUS | Status: DC

## 2011-12-04 MED ORDER — SODIUM CHLORIDE 0.9 % IV SOLN
4.0000 g | INTRAVENOUS | Status: DC | PRN
  Filled 2011-12-04: qty 40

## 2011-12-04 MED ORDER — SODIUM CHLORIDE 0.9 % IV SOLN
2.0000 g | INTRAVENOUS | Status: DC | PRN
  Filled 2011-12-04: qty 20

## 2011-12-04 MED ORDER — CALCIUM GLUCONATE 10 % IV SOLN
2.0000 g | INTRAVENOUS | Status: DC | PRN
  Filled 2011-12-04: qty 20

## 2011-12-04 MED ORDER — LIDOCAINE-PRILOCAINE 2.5-2.5 % EX CREA: 1.0000 "application " | TOPICAL_CREAM | CUTANEOUS | Status: DC | PRN

## 2011-12-04 MED ORDER — ACD FORMULA A 0.73-2.45-2.2 GM/100ML VI SOLN: 500.0000 mL | Status: DC

## 2011-12-04 MED ORDER — DIPHENHYDRAMINE HCL 25 MG PO CAPS: 25.0000 mg | ORAL_CAPSULE | Freq: Four times a day (QID) | ORAL | Status: DC | PRN

## 2011-12-04 MED ORDER — SODIUM CHLORIDE 0.9 % IV SOLN: 100.0000 mL | INTRAVENOUS | Status: DC | PRN

## 2011-12-04 MED ORDER — NEPRO/CARBSTEADY PO LIQD: 237.0000 mL | ORAL | Status: DC | PRN

## 2011-12-04 MED ORDER — HEPARIN SODIUM (PORCINE) 1000 UNIT/ML DIALYSIS: 1000.0000 [IU] | INTRAMUSCULAR | Status: DC | PRN

## 2011-12-04 MED ORDER — INSULIN GLARGINE 100 UNIT/ML ~~LOC~~ SOLN
23.0000 [IU] | Freq: Every day | SUBCUTANEOUS | Status: DC
  Administered 2011-12-05 (×2): 23 [IU] via SUBCUTANEOUS

## 2011-12-04 MED ORDER — HEPARIN SODIUM (PORCINE) 1000 UNIT/ML IJ SOLN: 1000.0000 [IU] | Freq: Once | INTRAMUSCULAR | Status: DC

## 2011-12-04 MED ORDER — PENTAFLUOROPROP-TETRAFLUOROETH EX AERO: 1.0000 "application " | INHALATION_SPRAY | CUTANEOUS | Status: DC | PRN

## 2011-12-04 MED ORDER — LIDOCAINE HCL (PF) 1 % IJ SOLN: 5.0000 mL | INTRAMUSCULAR | Status: DC | PRN

## 2011-12-04 MED ORDER — CALCIUM CARBONATE ANTACID 500 MG PO CHEW
2.0000 | CHEWABLE_TABLET | ORAL | Status: DC | PRN
  Filled 2011-12-04: qty 2

## 2011-12-04 MED ORDER — CALCIUM GLUCONATE 10 % IV SOLN: 2.0000 g | Freq: Once | INTRAVENOUS | Status: DC

## 2011-12-04 MED ORDER — ALTEPLASE 2 MG IJ SOLR: 2.0000 mg | Freq: Once | INTRAMUSCULAR | Status: AC | PRN

## 2011-12-04 MED ORDER — ACETAMINOPHEN 325 MG PO TABS
650.0000 mg | ORAL_TABLET | ORAL | Status: DC | PRN
  Administered 2011-12-05 (×2): 650 mg via ORAL
  Filled 2011-12-04: qty 2

## 2011-12-04 NOTE — Progress Notes (Signed)
Subjective: Much more lethargic today.  Objective: Vital signs in last 24 hours: Temp:  [97.5 F (36.4 C)-98.5 F (36.9 C)] 97.5 F (36.4 C) (01/20 1037) Pulse Rate:  [66-78] 66  (01/20 1037) Resp:  [19-20] 19  (01/20 1037) BP: (108-151)/(51-71) 150/68 mmHg (01/20 1037) SpO2:  [92 %-97 %] 96 % (01/20 1037) Weight change:  Last BM Date: 12/03/11  Intake/Output from previous day: 01/19 0701 - 01/20 0700 In: 1440 [P.O.:1440] Out: 600 [Urine:600] Total I/O In: 360 [P.O.:360] Out: -    Physical Exam: General: Alert, awake, oriented x3, in no acute distress. HEENT: No bruits, no goiter. Heart: Regular rate and rhythm, without murmurs, rubs, gallops. Lungs: Clear to auscultation bilaterally. Abdomen: Soft, nontender, nondistended, positive bowel sounds. Extremities: No clubbing cyanosis or edema with positive pedal pulses. Neuro: Grossly intact, nonfocal.    Lab Results: Basic Metabolic Panel:  Basename 12/04/11 0500 12/03/11 0825  NA 138 136  K 4.0 4.2  CL 97 93*  CO2 28 27  GLUCOSE 237* 384*  BUN 124* 107*  CREATININE 6.91* 6.48*  CALCIUM 8.3* 9.2  MG -- --  PHOS 7.4* 7.0*   Liver Function Tests:  Basename 12/04/11 0500 12/03/11 0825  AST -- --  ALT -- --  ALKPHOS -- --  BILITOT -- --  PROT -- --  ALBUMIN 2.8* 3.2*   CBC:  Basename 12/04/11 0500 12/03/11 0825  WBC 8.9 9.8  NEUTROABS -- --  HGB 7.1* 7.6*  HCT 20.8* 22.5*  MCV 77.0* 76.8*  PLT 158 157   CBG:  Basename 12/04/11 1139 12/04/11 0731 12/03/11 2110 12/03/11 1715 12/03/11 1141 12/03/11 0938  GLUCAP 312* 242* 345* 363* 430* 399*   Studies/Results: Ir US Guide Vasc Access Right  12/02/2011  *RADIOLOGY REPORT*  Indication: Renal failure.  PROCEDURE(S): FLUOROSCOPIC AND ULTRASOUND GUIDED PLACEMENT OF A TUNNELED DIAYSIS CATHETER  Physician:  Stephan Minister. Henn, MD  Medications: Versed 1.5 mg, Fentanyl 50 mcg. A radiology nurse monitored the patient for moderate sedation.  As antibiotic  prophylaxis, Ancef 1 gm was ordered pre-procedure and administered intravenously within one hour of incision.  Moderate sedation time: 23 minutes  Fluoroscopy time: 0.35minutes  Procedure:Informed consent was obtained for placement of a tunneled dialysis catheter.  The patient was placed supine on the interventional table.  Ultrasound confirmed a patent right internal jugularvein.  Ultrasound images were obtained for documentation. The right neck was prepped and draped in a sterile fashion.  The right neck was anesthetized with 1% lidocaine.  Maximal barrier sterile technique was utilized including caps, mask, sterile gowns, sterile gloves, sterile drape, hand hygiene and skin antiseptic.  A small incision was made with #11 blade scalpel.  A 21 gauge needle directed into the right internal jugular vein with ultrasound guidance.  A micropuncture dilator set was placed.  A 23 cm tip to cuff HemoSplit catheter was selected.  The skin below the right clavicle was anesthetized and a small incision was made with a #11 blade scalpel.  A subcutaneous tunnel was formed to the vein dermatotomy site.  The catheter was brought through the tunnel. The vein dermatotomy site was dilated to accommodate a peel-away sheath.  The catheter was placed through the peel-away sheath and directed into the central venous structures.  The tip of the catheter was placed at the cavoatrial junction with fluoroscopy. Fluoroscopic images were obtained for documentation.  Both lumens were found to aspirate and flush well.  The proper amount of heparin was flushed in both lumens.  The vein dermatotomy site was closed using a single layer of absorbable suture and Dermabond. The catheter was secured to the skin using Prolene suture.  Findings:Catheter tip at the cavoatrial junction.  Complications: None  Impression:Successful placement of a right chest tunneled dialysis catheter using ultrasound and fluoroscopic guidance.  Original Report Authenticated  By: Markus Daft, M.D.   Ir Central Line Right  12/02/2011  *RADIOLOGY REPORT*  Indication: Renal failure.  PROCEDURE(S): FLUOROSCOPIC AND ULTRASOUND GUIDED PLACEMENT OF A TUNNELED DIAYSIS CATHETER  Physician:  Stephan Minister. Henn, MD  Medications: Versed 1.5 mg, Fentanyl 50 mcg. A radiology nurse monitored the patient for moderate sedation.  As antibiotic prophylaxis, Ancef 1 gm was ordered pre-procedure and administered intravenously within one hour of incision.  Moderate sedation time: 23 minutes  Fluoroscopy time: 0.62minutes  Procedure:Informed consent was obtained for placement of a tunneled dialysis catheter.  The patient was placed supine on the interventional table.  Ultrasound confirmed a patent right internal jugularvein.  Ultrasound images were obtained for documentation. The right neck was prepped and draped in a sterile fashion.  The right neck was anesthetized with 1% lidocaine.  Maximal barrier sterile technique was utilized including caps, mask, sterile gowns, sterile gloves, sterile drape, hand hygiene and skin antiseptic.  A small incision was made with #11 blade scalpel.  A 21 gauge needle directed into the right internal jugular vein with ultrasound guidance.  A micropuncture dilator set was placed.  A 23 cm tip to cuff HemoSplit catheter was selected.  The skin below the right clavicle was anesthetized and a small incision was made with a #11 blade scalpel.  A subcutaneous tunnel was formed to the vein dermatotomy site.  The catheter was brought through the tunnel. The vein dermatotomy site was dilated to accommodate a peel-away sheath.  The catheter was placed through the peel-away sheath and directed into the central venous structures.  The tip of the catheter was placed at the cavoatrial junction with fluoroscopy. Fluoroscopic images were obtained for documentation.  Both lumens were found to aspirate and flush well.  The proper amount of heparin was flushed in both lumens.  The vein dermatotomy  site was closed using a single layer of absorbable suture and Dermabond. The catheter was secured to the skin using Prolene suture.  Findings:Catheter tip at the cavoatrial junction.  Complications: None  Impression:Successful placement of a right chest tunneled dialysis catheter using ultrasound and fluoroscopic guidance.  Original Report Authenticated By: Markus Daft, M.D.    Medications: Scheduled Meds:    . therapeutic plasma exchange solution   Dialysis Once in dialysis  . amLODipine  10 mg Oral Daily  . calcium gluconate  2 g Intravenous Once  . carvedilol  6.25 mg Oral BID WC  . cyclophosphamide  200 mg Oral Daily  . dutasteride  0.5 mg Oral Daily  . heparin  1,000 Units Intracatheter Once  . heparin  1,000 Units Intracatheter Once  . hydrALAZINE  100 mg Oral BID  . insulin aspart  0-9 Units Subcutaneous TID AC & HS  . insulin glargine  18 Units Subcutaneous QHS  . ketotifen  1 drop Both Eyes BID  . predniSONE  60 mg Oral Q breakfast  . sevelamer  800 mg Oral TID WC  . Tamsulosin HCl  0.4 mg Oral Daily  . terazosin  5 mg Oral Daily  . Vitamin D (Ergocalciferol)  50,000 Units Oral Q7 days   Continuous Infusions:    . citrate dextrose    .  DISCONTD: citrate dextrose     PRN Meds:.sodium chloride, sodium chloride, acetaminophen, acetaminophen, acetaminophen, albuterol, alteplase, alum & mag hydroxide-simeth, calcium carbonate, calcium gluconate IV, calcium gluconate IV, calcium gluconate, dextrose, diphenhydrAMINE, feeding supplement (NEPRO CARB STEADY), fentaNYL, glucose-Vitamin C, guaiFENesin-dextromethorphan, heparin, lidocaine, lidocaine-prilocaine, meclizine, menthol-cetylpyridinium, midazolam, ondansetron (ZOFRAN) IV ondansetron, pentafluoroprop-tetrafluoroeth, DISCONTD: acetaminophen, DISCONTD: calcium carbonate, DISCONTD: calcium gluconate IV, DISCONTD: calcium gluconate IV, DISCONTD: calcium gluconate, DISCONTD: diphenhydrAMINE  Assessment/Plan:  Principal Problem:   *Renal failure Active Problems:  Hypercholesterolemia  Hypertension  Diabetes mellitus type II  Benign positional vertigo  #1 Acute renal failure: Workup currently ongoing by nephrology. He is now status post renal biopsy with results pending, has been started on steroids and Cytoxan given elevated ANA. Rest of workup pending. Dialysis catheter was placed yesterday he will have dialysis today evening and worsening azotemia and possibly uremic encephalopathy.  #2 type 2 diabetes: CBGs are very elevated secondary to steroids. Will increase Lantus to 23.  #3 anemia: Hemoglobin today 7.1. Will receive 2 units of PRBCs with hemodialysis today.   LOS: 11 days   HERNANDEZ ACOSTA,ESTELA [facility] Pager: [phone] 12/04/2011, 2:32 PM

## 2011-12-04 NOTE — Progress Notes (Signed)
Subjective: Interval History:  Still having scant hemoptysis collected in cup. Is thirsty but under impression his fluids are restricted. Discussed that RN is measuring intake, not restricted. UO 600 ml in past 24 hours.  Patient had Rt IJ cath placed and plasma exchange 1/19 early am and started on cytoxan. Tolerated well.   Objective:  Vital signs in last 24 hours:  Temp:  [97.9 F (36.6 C)-98.5 F (36.9 C)] 98.2 F (36.8 C) (01/20 0528) Pulse Rate:  [63-78] 67  (01/20 0528) Resp:  [19-20] 20  (01/20 0528) BP: (108-152)/(51-71) 131/71 mmHg (01/20 0528) SpO2:  [92 %-97 %] 97 % (01/20 0528) Weight change:   Intake/Output: I/O last 3 completed shifts: In: 1560 [P.O.:1560] Out: [id number] [Urine:875]   Intake/Output Summary (Last 24 hours) at 12/04/11 1155 Last data filed at 12/04/11 0900  Gross per 24 hour  Intake   1320 ml  Output    200 ml  Net   1120 ml     EXAM: HEENT: NCAT, NAD. Alert and conversing with brother in law at bedside. RS- Rt basilar rales. Nonlabored.  CV: RRR, no murmurs. ABD- Nontender, BS + EXT- Trace bilateral LE edema, nontender. Rt IJ tunneled catheter.  NEURO-a/o x3. No asterixis. No gross motor deficits.   Lab Results:  Basename 12/04/11 0500 12/03/11 0825  WBC 8.9 9.8  HGB 7.1* 7.6*  HCT 20.8* 22.5*  PLT 158 157   BMET  Basename 12/04/11 0500 12/03/11 0825 12/02/11 0420  NA 138 136 133*  K 4.0 4.2 4.4  CL 97 93* 96  CO2 28 27 25   GLUCOSE 237* 384* 256*  BUN 124* 107* 91*  CREATININE 6.91* 6.48* 6.60*  CALCIUM 8.3* 9.2 8.6  PHOS 7.4* 7.0* 5.0*   LFT  Basename 12/04/11 0500  PROT --  ALBUMIN 2.8*  AST --  ALT --  ALKPHOS --  BILITOT --  BILIDIR --  IBILI --   PT/INR No results found for this basename: LABPROT:2,INR:2 in the last 72 hours Hepatitis Panel  Basename 12/02/11 1200  HEPBSAG NEGATIVE  HCVAB --  HEPAIGM --  HEPBIGM --   PTH: Lab Results  Component Value Date   CALCIUM 8.3* 12/04/2011   PHOS 7.4* 12/04/2011    Albumin: 2.7  Anemia lab work-up: Iron: 29 Ferritin: 154 Sat: 14 Folate 7.4  B12 456  Renal lab work-up: UNa:  81 [place]: 35 UCl: 36 UCr: 50.35 UPr: 1 UA 1/10:  large RBC's, negative protein, ketones glucose, bilirubin, LE and nirtrites. Micro exam: TNTC RBC's per HPF. UA 1/11: 100 protein, 15 ketones, small bilirubin, negative blood.  C3 129 C4 31 ASO 64 Ratio UPr/Cr: 0.23 24 hr urine protein: 360 mg 24 hr urine creatinine: 1511 SPEP/UPEP- kappa 19.9, lambda 3.59, ratio 5.56, immunofixation pending  ANA- positive, 1:1280 Anti-ds DNA-36 (equivocal) Anti-myeloperoxidate positive (see paper records) HIV-negative   Studies/Results: 2D-ECHO: EF 55-60%, normal wall motion     . therapeutic plasma exchange solution   Dialysis Once in dialysis  . amLODipine  10 mg Oral Daily  . calcium gluconate  2 g Intravenous Once  . carvedilol  6.25 mg Oral BID WC  . cyclophosphamide  200 mg Oral Daily  . dutasteride  0.5 mg Oral Daily  . heparin  1,000 Units Intracatheter Once  . heparin  1,000 Units Intracatheter Once  . hydrALAZINE  100 mg Oral BID  . insulin aspart  0-9 Units Subcutaneous TID AC & HS  . insulin glargine  18 Units Subcutaneous QHS  .  ketotifen  1 drop Both Eyes BID  . predniSONE  60 mg Oral Q breakfast  . sevelamer  800 mg Oral TID WC  . Tamsulosin HCl  0.4 mg Oral Daily  . terazosin  5 mg Oral Daily  . Vitamin D (Ergocalciferol)  50,000 Units Oral Q7 days  . DISCONTD: insulin glargine  16 Units Subcutaneous QHS   Assessment/Plan: 78 yo M with uncontrolled hypertension presents with progressive renal decline, +MPO Ab concerning for microscopic polyangiitis.   1. Acute on chronic kidney disease: Baseline Cr is 0.9-1.2 from June 2012.  Creatinine continues to rise at a rate of  0.3-0.6 mg/dL. BUN rising possibly related to steroids in addition to renal function, will monitor neuro exam for signs of uremia, none today. No evidence of fluid overload on exam.   Proteinuria quantified and not within a nephrotic range. Positive labs include high titer ANA postive and anti-MPO Ab. Pending lab ANCA, anti-GBM. This is concerning for microscopic polyangiitis, pending renal biopsy results. Question if new hemoptysis may indicate pulmonary work up also. Started on solumedrol empirically 11/30/11 transitioned to prednisone today. Cytoxan started 1/18 and plan for PEX treatment again on Monday given his significant declining renal function. Patient now has temp RIJ catheter if indication for emergent HD develops.  2. ANEMIA-iron deficiency anemia. Patient received feraheme on 11/26/11. Repeat Hgb trending down. Hgb 7.6 after biopsy-->7.1 today. Consider transfusion in setting of hemoptysis and possible ongoing blood loss.  3. HTN/VOL-BP well controlled today,  goal SBP 140-130 on norvasc, coreg, hydralazine, terazosin.  4. Dizziness: Slight interval improvement per pt report. Suspect BPPV. Pt at times better after Dix-Hallpike maneuvers. Management  per primary team.  5. Hyperglycemia. Lantus increased to 18units + SSI. Likely due to steroid, transition to prednisone today.     LOS: [place] PGY-2 12/04/2011

## 2011-12-04 NOTE — Progress Notes (Signed)
Patient seen and examined and agree with assessment and plan per Dr. Verlee Rossetti (PGY2). Azotemia is worsening and we will start pt on HD today.  Will transfuse 2 units with HD today as well.  Plan second plasma exchange tomorrow.     Kelly Splinter, MD [employer] [phone] cell 12/04/2011, 1:25 PM

## 2011-12-05 ENCOUNTER — Inpatient Hospital Stay (HOSPITAL_COMMUNITY): Payer: Medicare Other

## 2011-12-05 LAB — GLUCOSE, CAPILLARY
Glucose-Capillary: 191 mg/dL — ABNORMAL HIGH (ref 70–99)
Glucose-Capillary: 220 mg/dL — ABNORMAL HIGH (ref 70–99)
Glucose-Capillary: 232 mg/dL — ABNORMAL HIGH (ref 70–99)

## 2011-12-05 LAB — RENAL FUNCTION PANEL
Albumin: 2.6 g/dL — ABNORMAL LOW (ref 3.5–5.2)
BUN: 98 mg/dL — ABNORMAL HIGH (ref 6–23)
CO2: 29 mEq/L (ref 19–32)
Chloride: 101 mEq/L (ref 96–112)
Potassium: 4 mEq/L (ref 3.5–5.1)

## 2011-12-05 LAB — CBC
HCT: 23.5 % — ABNORMAL LOW (ref 39.0–52.0)
Hemoglobin: 9.2 g/dL — ABNORMAL LOW (ref 13.0–17.0)
MCH: 26.9 pg (ref 26.0–34.0)
MCH: 26.4 pg (ref 26.0–34.0)
MCHC: 34.1 g/dL (ref 30.0–36.0)
MCHC: 34.6 g/dL (ref 30.0–36.0)
MCV: 77.6 fL — ABNORMAL LOW (ref 78.0–100.0)
Platelets: 174 10*3/uL (ref 150–400)
Platelets: 126 10*3/uL — ABNORMAL LOW (ref 150–400)
RBC: 3.27 MIL/uL — ABNORMAL LOW (ref 4.22–5.81)
RDW: 14.4 % (ref 11.5–15.5)
RDW: 14.6 % (ref 11.5–15.5)
RDW: 14.7 % (ref 11.5–15.5)
WBC: 10.4 10*3/uL (ref 4.0–10.5)

## 2011-12-05 LAB — OCCULT BLOOD X 1 CARD TO LAB, STOOL: Fecal Occult Bld: NEGATIVE

## 2011-12-05 MED ORDER — SODIUM CHLORIDE 0.9 % IV SOLN
Freq: Once | INTRAVENOUS | Status: AC
  Administered 2011-12-05: 11:00:00 via INTRAVENOUS_CENTRAL
  Filled 2011-12-05 (×6): qty 200

## 2011-12-05 MED ORDER — CALCIUM CARBONATE ANTACID 500 MG PO CHEW
CHEWABLE_TABLET | ORAL | Status: AC
  Filled 2011-12-05: qty 2

## 2011-12-05 MED ORDER — ACD FORMULA A 0.73-2.45-2.2 GM/100ML VI SOLN
Status: AC
  Filled 2011-12-05: qty 500

## 2011-12-05 MED ORDER — DARBEPOETIN ALFA-POLYSORBATE 60 MCG/0.3ML IJ SOLN
INTRAMUSCULAR | Status: AC
  Filled 2011-12-05: qty 0.3

## 2011-12-05 MED ORDER — DARBEPOETIN ALFA-POLYSORBATE 60 MCG/0.3ML IJ SOLN
60.0000 ug | INTRAMUSCULAR | Status: DC
  Administered 2011-12-05: 60 ug via INTRAVENOUS
  Filled 2011-12-05 (×2): qty 0.3

## 2011-12-05 MED ORDER — FENTANYL CITRATE 0.05 MG/ML IJ SOLN: 12.5000 ug | Freq: Three times a day (TID) | INTRAMUSCULAR | Status: DC | PRN

## 2011-12-05 NOTE — Progress Notes (Signed)
Pt urinating bloody urine with clots noted. Dr. Moshe Cipro notified. No heparin given during dialysis treatment.

## 2011-12-05 NOTE — Progress Notes (Signed)
Physical Therapy Treatment Patient Details Name: Ryan Jimenez MRN: [id number] DOB: [birth year] Today's Date: 12/05/2011  PT Assessment/Plan  PT - Assessment/Plan Comments on Treatment Session: Tx limited as HD came to take pt for procedure. Will reattempt for walk later today as able. Overall, pt optimistic about his outcome and reports less dizziness.  PT Plan: Frequency needs to be updated PT Frequency: Min 2X/week Follow Up Recommendations: Skilled nursing facility Equipment Recommended: Defer to next venue PT Goals  Acute Rehab PT Goals PT Goal: Supine/Side to Sit - Progress: Progressing toward goal PT Goal: Sit at [facility] Of Bed - Progress: Progressing toward goal PT Goal: Sit to Supine/Side - Progress: Progressing toward goal  PT Treatment Precautions/Restrictions  Precautions Precautions: Fall Precaution Comments: vestibular concerns Required Braces or Orthoses: No Restrictions Weight Bearing Restrictions: No Mobility (including Balance) Bed Mobility Bed Mobility: Yes Supine to Sit: 5: Supervision;HOB elevated (Comment degrees);With rails (30deg) Supine to Sit Details (indicate cue type and reason): Cues for efficiency Sit to Supine: 6: Modified independent (Device/Increase time);HOB flat Transfers Transfers: No (HD came to get pt before able to stand for gait) Ambulation/Gait Ambulation/Gait: No (HD came for procedure)  Static Sitting Balance Static Sitting - Balance Support: No upper extremity supported Static Sitting - Level of Assistance: 5: Stand by assistance Static Sitting - Comment/# of Minutes: Sat EOB x98min with no unsteadiness  Exercise  Other Exercises Other Exercises: Observed pt performing saccade ex with cues for completing full set and for continuous eye movement as able.  End of Session PT - End of Session Activity Tolerance: Patient tolerated treatment well Patient left: in bed;with call bell in reach;with family/visitor present Nurse  Communication: Mobility status for transfers General Behavior During Session: [facility] for tasks performed Cognition: [facility] for tasks performed  [place], [place]    12/05/2011, 10:38 AM

## 2011-12-05 NOTE — Significant Event (Signed)
RN called this NP about family's concern over Pt's c/o flank pain and noted hematuria. NP to bedside to speak to family after reviewing chart and recent progress notes. It seems this hematuria is not new. Chart says renal bx results are pending, but polyangitis is suspected and pt is already on tx for this.  I spoke to pt and family. I reassured them that I do not think this flank pain or bleeding is an acute issue or the result of the actual renal bx itself. I told them it is more likely due to the suspected problem with his kidneys and that is why we are waiting on bx results. Pt is concerned over feeling like he can't urinate.  Pt's flank pain is lessened by recent Tylenol dose. He has narcotics if worse. Will cont to follow. CBC ordered to monitor H/H. Will get bladder scan to see if having retention. Pt went to HD today.  Forrest Moron, NP [facility]

## 2011-12-05 NOTE — Progress Notes (Signed)
Subjective: Interval History:  Started HD last evening, tolerated well. Wife and patient updated at bedside, questions answered. Still having scant hemoptysis collected in cup. UO 675 ml in past 24 hours.  Volume status +585 total. Patient actually looks reasonably well given what we are doing to him   Objective:  Vital signs in last 24 hours:  Temp:  [97.5 F (36.4 C)-98.6 F (37 C)] 98.6 F (37 C) (01/21 0547) Pulse Rate:  [61-69] 61  (01/21 0547) Resp:  [15-20] 18  (01/21 0547) BP: (125-157)/(61-87) 125/61 mmHg (01/21 0547) SpO2:  [92 %-98 %] 92 % (01/21 0547) Weight:  [255 lb 8.2 oz (115.9 kg)] 255 lb 8.2 oz (115.9 kg) (01/20 2313) Weight change:   Intake/Output: I/O last 3 completed shifts: In: 1500 [P.O.:600; I.V.:200; Blood:700] Out: 875 [Urine:875]   Intake/Output Summary (Last 24 hours) at 12/05/11 0750 Last data filed at 12/05/11 0546  Gross per 24 hour  Intake   1260 ml  Output    675 ml  Net    585 ml     EXAM: HEENT: NCAT, NAD. Alert and conversing with wife at bedside. RS- Rt basilar rales. Nonlabored.  CV: RRR, no murmurs. ABD- Nontender, BS + EXT- Trace bilateral LE edema, nontender. Rt IJ tunneled catheter.  NEURO-a/o x3. No asterixis. No gross motor deficits.   Lab Results:  Basename 12/05/11 0448 12/04/11 0500 12/03/11 0825  WBC 10.4 8.9 9.8  HGB 9.2* 7.1* 7.6*  HCT 27.0* 20.8* 22.5*  PLT 175 158 157   BMET  Basename 12/05/11 0448 12/04/11 0500 12/03/11 0825  NA 140 138 136  K 4.0 4.0 4.2  CL 101 97 93*  CO2 29 28 27   GLUCOSE 178* 237* 384*  BUN 98* 124* 107*  CREATININE 5.29* 6.91* 6.48*  CALCIUM 8.3* 8.3* 9.2  PHOS 5.7* 7.4* 7.0*   LFT  Basename 12/05/11 0448  PROT --  ALBUMIN 2.6*  AST --  ALT --  ALKPHOS --  BILITOT --  BILIDIR --  IBILI --   PT/INR No results found for this basename: LABPROT:2,INR:2 in the last 72 hours Hepatitis Panel  Basename 12/02/11 1200  HEPBSAG NEGATIVE  HCVAB --  HEPAIGM --  HEPBIGM --    PTH: Lab Results  Component Value Date   CALCIUM 8.3* 12/05/2011   PHOS 5.7* 12/05/2011  Albumin: 2.7  Anemia lab work-up: Iron: 29 Ferritin: 154 Sat: 14 Folate 7.4  B12 456  Renal lab work-up: UNa:  81 [place]: 35 UCl: 36 UCr: 50.35 UPr: 1 UA 1/10:  large RBC's, negative protein, ketones glucose, bilirubin, LE and nirtrites. Micro exam: TNTC RBC's per HPF. UA 1/11: 100 protein, 15 ketones, small bilirubin, negative blood.  C3 129 C4 31 ASO 64 Ratio UPr/Cr: 0.23 24 hr urine protein: 360 mg 24 hr urine creatinine: 1511 SPEP/UPEP- kappa 19.9, lambda 3.59, ratio 5.56, immunofixation pending  ANA- positive, 1:1280 Anti-ds DNA-36 (equivocal) Anti-myeloperoxidate positive (see paper records) HIV-negative   Studies/Results: 2D-ECHO: EF 55-60%, normal wall motion     . therapeutic plasma exchange solution   Dialysis Once in dialysis  . amLODipine  10 mg Oral Daily  . calcium gluconate  2 g Intravenous Once  . carvedilol  6.25 mg Oral BID WC  . cyclophosphamide  200 mg Oral Daily  . dutasteride  0.5 mg Oral Daily  . heparin  1,000 Units Intracatheter Once  . heparin  1,000 Units Intracatheter Once  . hydrALAZINE  100 mg Oral BID  . insulin aspart  0-9 Units Subcutaneous TID AC & HS  . insulin glargine  23 Units Subcutaneous QHS  . ketotifen  1 drop Both Eyes BID  . predniSONE  60 mg Oral Q breakfast  . sevelamer  800 mg Oral TID WC  . Tamsulosin HCl  0.4 mg Oral Daily  . terazosin  5 mg Oral Daily  . Vitamin D (Ergocalciferol)  50,000 Units Oral Q7 days  . DISCONTD: therapeutic plasma exchange solution   Dialysis Once in dialysis  . DISCONTD: calcium gluconate  2 g Intravenous Once  . DISCONTD: heparin  1,000 Units Intracatheter Once  . DISCONTD: insulin glargine  18 Units Subcutaneous QHS   Assessment/Plan: 78 yo M with uncontrolled hypertension presents with progressive renal decline, +MPO Ab concerning for microscopic polyangiitis and hemoptysis.   1. Acute on  chronic kidney disease: Baseline Cr is 0.9-1.2 from June 2012, also Sept 2012.  Creatinine rising at a rate of  0.3-0.6 mg/dL/day since admission. Azotemia improved after HD initiated 1/20. No evidence of fluid overload on exam.  Proteinuria quantified and not within a nephrotic range. Positive labs include high titer ANA postive and anti-MPO Ab. This is concerning for microscopic polyangiitis, pending renal biopsy results. Pending lab ANCA, anti-GBM. Question if new hemoptysis may indicate pulmonary work up also. Started on solumedrol empirically 11/30/11 transitioned to prednisone 1/19. Cytoxan started 1/18 and plan for PEX treatment again today. Patient has temp RIJ catheter, may need permanent access depending on clinical course. Will do HD today as well for uremia 2. ANEMIA-iron deficiency anemia. Patient received feraheme on 11/26/11. Hgb trending down to 7.1 after biopsy, good response with 2 units given in HD. F/u daily CBC. Start aranesp 3. HTN/VOL-BP well controlled today,  goal SBP 140-130 on norvasc, coreg, hydralazine, terazosin.  4. Dizziness: Slight interval improvement per pt report. Suspect BPPV. Pt at times better after Dix-Hallpike maneuvers. Management  per primary team.  5. Hyperglycemia. Lantus increased to 18units + SSI. Likely due to steroid, transition to prednisone today.     LOS: [place] PGY-2 12/05/2011  Patient seen and examined, agree with above note with above modifications.  Corliss Parish, MD 12/05/2011  '

## 2011-12-05 NOTE — Progress Notes (Signed)
Subjective: Chart reviewed. Patient seen at dialysis. Complaints of left lower back pain. Also? New onset hematuria and intermittent coughing of blood.  Objective: Blood pressure 106/43, pulse 64, temperature 97.5 F (36.4 C), temperature source Oral, resp. rate 20, height 5\' 9"  (1.753 m), weight 115.9 kg (255 lb 8.2 oz), SpO2 92.00%.  Intake/Output Summary (Last 24 hours) at 12/05/11 1807 Last data filed at 12/05/11 1700  Gross per 24 hour  Intake    900 ml  Output   1475 ml  Net   -575 ml    General Exam: Comfortable. Respiratory System: Clear. No increased work of breathing. Cardiovascular System: First and second heart sounds heard. Regular rate and rhythm. No JVD/murmurs. Telemetry shows sinus bradycardia in the high 50s to sinus rhythm Gastrointestinal System: Abdomen is non distended, soft and normal bowel sounds heard. Central Nervous System: Alert and oriented. No focal neurological deficits.  Lab Results: Basic Metabolic Panel:  Basename 12/05/11 0448 12/04/11 0500  NA 140 138  K 4.0 4.0  CL 101 97  CO2 29 28  GLUCOSE 178* 237*  BUN 98* 124*  CREATININE 5.29* 6.91*  CALCIUM 8.3* 8.3*  MG -- --  PHOS 5.7* 7.4*   Liver Function Tests:  Basename 12/05/11 0448 12/04/11 0500  AST -- --  ALT -- --  ALKPHOS -- --  BILITOT -- --  PROT -- --  ALBUMIN 2.6* 2.8*   No results found for this basename: LIPASE:2,AMYLASE:2 in the last 72 hours No results found for this basename: AMMONIA:2 in the last 72 hours CBC:  Basename 12/05/11 1628 12/05/11 0448  WBC 8.8 10.4  NEUTROABS -- --  HGB 8.8* 9.2*  HCT 25.4* 27.0*  MCV 77.7* 77.1*  PLT 174 175   Cardiac Enzymes: No results found for this basename: CKTOTAL:3,CKMB:3,CKMBINDEX:3,TROPONINI:3 in the last 72 hours BNP: No results found for this basename: PROBNP:3 in the last 72 hours D-Dimer: No results found for this basename: DDIMER:2 in the last 72 hours CBG:  Basename 12/05/11 1335 12/05/11 0725 12/04/11 2350  12/04/11 1645 12/04/11 1139 12/04/11 0731  GLUCAP 191* 147* 220* 251* 312* 242*   Hemoglobin A1C: No results found for this basename: HGBA1C in the last 72 hours Fasting Lipid Panel: No results found for this basename: CHOL,HDL,LDLCALC,TRIG,CHOLHDL,LDLDIRECT in the last 72 hours Thyroid Function Tests: No results found for this basename: TSH,T4TOTAL,FREET4,T3FREE,THYROIDAB in the last 72 hours Anemia Panel: No results found for this basename: VITAMINB12,FOLATE,FERRITIN,TIBC,IRON,RETICCTPCT in the last 72 hours Coagulation: No results found for this basename: LABPROT:2,INR:2 in the last 72 hours Urine Drug Screen: Drugs of Abuse  No results found for this basename: labopia,  cocainscrnur,  labbenz,  amphetmu,  thcu,  labbarb     Micro Results: No results found for this or any previous visit (from the past 240 hour(s)).  Studies/Results: Dg Chest 2 View  11/26/2011  *RADIOLOGY REPORT*  Clinical Data: 78 year old male with shortness of breath, suspect fluid overload.  CHEST - 2 VIEW  Comparison: 1133 hours the same day and earlier.  Findings: Seated AP and lateral views of the chest.  Slightly lower lung volumes.  Stable cardiac size and mediastinal contours.  No pneumothorax.  No overt pulmonary edema.  Crowding lung markings, especially at the left base.  No consolidation.  No acute pulmonary opacity. No acute osseous abnormality identified.  IMPRESSION: Low lung volumes, otherwise no acute cardiopulmonary abnormality.  Original Report Authenticated By: Randall An, M.D.   Dg Chest 2 View  11/26/2011  *RADIOLOGY REPORT*  Clinical Data: Shortness of breath.  Dizziness.  Lightheadedness since 11/05/2011.  History of hypertension, diabetes.  CHEST - 2 VIEW  Comparison: 11/23/2011  Findings: The heart size is normal.  There is minimal left lower lobe atelectasis.  No focal consolidations or pleural effusions are identified.  No edema.  Significant degenerate changes are seen in the thoracic  spine.  IMPRESSION:  1.  Minimal left lower lobe atelectasis.  Original Report Authenticated By: Glenice Bow, M.D.   Ct Head Wo Contrast  11/21/2011  *RADIOLOGY REPORT*  Clinical Data: Dizziness and weakness  CT HEAD WITHOUT CONTRAST  Technique:  Contiguous axial images were obtained from the base of the skull through the vertex without contrast.  Comparison: None.  Findings: No acute intracranial hemorrhage.  No focal mass lesion. No CT evidence of acute infarction.   No midline shift or mass effect.  No hydrocephalus.  Basilar cisterns are patent.  There is generalized cortical atrophy and periventricular white matter hypodensities.  Paranasal sinuses and mastoid air cells are clear.  Orbits are normal.  IMPRESSION:  1.  No acute intracranial findings. 2.  Atrophy and microvascular disease.  Original Report Authenticated By: Suzy Bouchard, M.D.   Mr Brain Wo Contrast  11/23/2011  *RADIOLOGY REPORT*  Clinical Data: Dizziness, near-syncope.  Rule out posterior circulation  CVA  MRI HEAD WITHOUT CONTRAST  Technique:  Multiplanar, multiecho pulse sequences of the brain and surrounding structures were obtained according to standard protocol without intravenous contrast.  Comparison: CT 11/21/2011  Findings: Age appropriate atrophy.  Mild chronic microvascular ischemic change in the deep white matter bilaterally.  Brainstem is intact.  Negative for acute infarct.  Negative for hemorrhage or mass lesion.  Chronic sinusitis with mucosal edema in the paranasal sinuses.  IMPRESSION: No acute intracranial abnormality.  Chronic sinusitis.  Original Report Authenticated By: Truett Perna, M.D.   US Guided Needle Placement  11/30/2011  *RADIOLOGY REPORT*  Indication: Acute on chronic renal insufficiency.  ULTRASOUND GUIDED RENAL BIOPSY  Comparison: Renal ultrasound - 11/24/2011; CTA of the abdomen pelvis - 10/14/2008  Medications: Conscious sedation administered by nursing.  Total Moderate Sedation time: 40  minutes  Complications: The patient developed small perinephric hematoma after the second attempted biopsy, however remained asymptomatic (denied flank pain) and remained hemodynamically stable.  Procedure:  Informed written consent was obtained from the patient after a discussion of the risks, benefits and alternatives to treatment. The patient understands and consents to the procedure.  A timeout was performed prior to the initiation of the procedure.  Ultrasound scanning was performed of the left kidney, however demonstrated relatively poor sonographic window, enlarged part to the patient's body habitus and respiratory rate.  Ultrasound scanning was performed of the right kidney demonstrating an adequate sonographic window and as such, the procedure was planned.  The operative site was prepped with betadine and draped in the usual sterile fashion.  The overlying soft tissues were anesthetized with 1% lidocaine with epinephrine.  A 17 gauge core needle biopsy device was advanced into the inferior cortex of the left kidney and 2 core biopsies were obtained under direct ultrasound guidance.  Images were saved for documentation purposes.  Review of the obtained biopsy samples failed to demonstrate adequate tissue acquisition.  Repeat ultrasound scanning demonstrated a small right-sided perinephric hematoma, and while the patient remained asymptomatic (denied flank pain and remained hemodynamically stable), the decision was made to not to attempt an additional biopsy largely in part due to now poor sonographic window.  At this point, the procedure was terminated.  A dressing was placed.  The patient tolerated the procedure well without immediate post procedural complication.  The minimal amount of acquired tissue was reviewed with pathology and deemed not substantial enough for submission.  Impression:  Unsuccessful attempted ultrasound guided right renal biopsy. Future attempts may be performed under CT guidance as  clinically indicated.  Above discussed with Dr. Rosana Hoes at the time of procedure completion.  Original Report Authenticated By: Rachel Moulds, M.D.   US Renal  11/24/2011  *RADIOLOGY REPORT*  Clinical Data:  Renal failure; diabetes and hypertension  RENAL/URINARY TRACT ULTRASOUND COMPLETE  Comparison:  None.  Findings:  Right Kidney:  Normal in size and parenchymal echogenicity. Measures 13 cm. No evidence of mass or hydronephrosis.  Left Kidney:  Normal in size and parenchymal echogenicity. Measures 12.8 cm. No evidence of mass or hydronephrosis.  Bladder:  Appears normal for degree of bladder distention.  IMPRESSION: Normal study.  Original Report Authenticated By: Duayne Cal, M.D.   US Biopsy  12/02/2011  *RADIOLOGY REPORT*  Clinical Data/Indication: Hypotension.  Elevated creatinine.  ULTRASOUND-GUIDED RANDOM RENAL CORE BIOPSY  Sedation: Versed 2 mg, Fentanyl  mcg.  Total Moderate Sedation Time: 11 minutes.  Procedure: The procedure, risks, benefits, and alternatives were explained to the patient. Questions regarding the procedure were encouraged and answered. The patient understands and consents to the procedure.  The left low back was prepped with betadine in a sterile fashion, and a sterile drape was applied covering the operative field. A mask and sterile gloves were used for the procedure.  Under sonographic guidance, two 16 gauge core biopsies of the cortex of the lower pole of the left kidney were obtained. Final imaging was performed.  Patient tolerated the procedure well without complication.  Vital sign monitoring by nursing staff during the procedure will continue as patient is in the special procedures unit for post procedure observation.  Findings: The images document guide needle placement within the lower pole cortex of the left kidney. Post biopsy images demonstrate no evidence of hemorrhage.  IMPRESSION: Successful ultrasound-guided core biopsy of the lower pole of the left kidney.   Original Report Authenticated By: Jamas Lav, M.D.   Ir US Guide Vasc Access Right  12/02/2011  *RADIOLOGY REPORT*  Indication: Renal failure.  PROCEDURE(S): FLUOROSCOPIC AND ULTRASOUND GUIDED PLACEMENT OF A TUNNELED DIAYSIS CATHETER  Physician:  Stephan Minister. Henn, MD  Medications: Versed 1.5 mg, Fentanyl 50 mcg. A radiology nurse monitored the patient for moderate sedation.  As antibiotic prophylaxis, Ancef 1 gm was ordered pre-procedure and administered intravenously within one hour of incision.  Moderate sedation time: 23 minutes  Fluoroscopy time: 0.47minutes  Procedure:Informed consent was obtained for placement of a tunneled dialysis catheter.  The patient was placed supine on the interventional table.  Ultrasound confirmed a patent right internal jugularvein.  Ultrasound images were obtained for documentation. The right neck was prepped and draped in a sterile fashion.  The right neck was anesthetized with 1% lidocaine.  Maximal barrier sterile technique was utilized including caps, mask, sterile gowns, sterile gloves, sterile drape, hand hygiene and skin antiseptic.  A small incision was made with #11 blade scalpel.  A 21 gauge needle directed into the right internal jugular vein with ultrasound guidance.  A micropuncture dilator set was placed.  A 23 cm tip to cuff HemoSplit catheter was selected.  The skin below the right clavicle was anesthetized and a small incision was made with  a #11 blade scalpel.  A subcutaneous tunnel was formed to the vein dermatotomy site.  The catheter was brought through the tunnel. The vein dermatotomy site was dilated to accommodate a peel-away sheath.  The catheter was placed through the peel-away sheath and directed into the central venous structures.  The tip of the catheter was placed at the cavoatrial junction with fluoroscopy. Fluoroscopic images were obtained for documentation.  Both lumens were found to aspirate and flush well.  The proper amount of heparin was  flushed in both lumens.  The vein dermatotomy site was closed using a single layer of absorbable suture and Dermabond. The catheter was secured to the skin using Prolene suture.  Findings:Catheter tip at the cavoatrial junction.  Complications: None  Impression:Successful placement of a right chest tunneled dialysis catheter using ultrasound and fluoroscopic guidance.  Original Report Authenticated By: Markus Daft, M.D.   Dg Chest Port 1 View  11/24/2011  *RADIOLOGY REPORT*  Clinical Data: Short of breath  PORTABLE CHEST - 1 VIEW  Comparison: Chest radiograph 05/18/2010  Findings: Normal mediastinum and heart silhouette.  Lungs appear clear.  No effusion, infiltrate, pneumothorax.  No acute osseous abnormality.  IMPRESSION: No acute cardiopulmonary process.  Original Report Authenticated By: Suzy Bouchard, M.D.   Ir Central Line Right  12/02/2011  *RADIOLOGY REPORT*  Indication: Renal failure.  PROCEDURE(S): FLUOROSCOPIC AND ULTRASOUND GUIDED PLACEMENT OF A TUNNELED DIAYSIS CATHETER  Physician:  Stephan Minister. Henn, MD  Medications: Versed 1.5 mg, Fentanyl 50 mcg. A radiology nurse monitored the patient for moderate sedation.  As antibiotic prophylaxis, Ancef 1 gm was ordered pre-procedure and administered intravenously within one hour of incision.  Moderate sedation time: 23 minutes  Fluoroscopy time: 0.31minutes  Procedure:Informed consent was obtained for placement of a tunneled dialysis catheter.  The patient was placed supine on the interventional table.  Ultrasound confirmed a patent right internal jugularvein.  Ultrasound images were obtained for documentation. The right neck was prepped and draped in a sterile fashion.  The right neck was anesthetized with 1% lidocaine.  Maximal barrier sterile technique was utilized including caps, mask, sterile gowns, sterile gloves, sterile drape, hand hygiene and skin antiseptic.  A small incision was made with #11 blade scalpel.  A 21 gauge needle directed into the right  internal jugular vein with ultrasound guidance.  A micropuncture dilator set was placed.  A 23 cm tip to cuff HemoSplit catheter was selected.  The skin below the right clavicle was anesthetized and a small incision was made with a #11 blade scalpel.  A subcutaneous tunnel was formed to the vein dermatotomy site.  The catheter was brought through the tunnel. The vein dermatotomy site was dilated to accommodate a peel-away sheath.  The catheter was placed through the peel-away sheath and directed into the central venous structures.  The tip of the catheter was placed at the cavoatrial junction with fluoroscopy. Fluoroscopic images were obtained for documentation.  Both lumens were found to aspirate and flush well.  The proper amount of heparin was flushed in both lumens.  The vein dermatotomy site was closed using a single layer of absorbable suture and Dermabond. The catheter was secured to the skin using Prolene suture.  Findings:Catheter tip at the cavoatrial junction.  Complications: None  Impression:Successful placement of a right chest tunneled dialysis catheter using ultrasound and fluoroscopic guidance.  Original Report Authenticated By: Markus Daft, M.D.    Medications: Scheduled Meds:    . therapeutic plasma exchange solution   Dialysis Once  in dialysis  . therapeutic plasma exchange solution   Dialysis Once in dialysis  . amLODipine  10 mg Oral Daily  . calcium gluconate  2 g Intravenous Once  . carvedilol  6.25 mg Oral BID WC  . citrate dextrose      . citrate dextrose      . cyclophosphamide  200 mg Oral Daily  . darbepoetin      . darbepoetin (ARANESP) injection - DIALYSIS  60 mcg Intravenous Q Mon-HD  . dutasteride  0.5 mg Oral Daily  . heparin  1,000 Units Intracatheter Once  . heparin  1,000 Units Intracatheter Once  . hydrALAZINE  100 mg Oral BID  . insulin aspart  0-9 Units Subcutaneous TID AC & HS  . insulin glargine  23 Units Subcutaneous QHS  . ketotifen  1 drop Both Eyes  BID  . predniSONE  60 mg Oral Q breakfast  . sevelamer  800 mg Oral TID WC  . Tamsulosin HCl  0.4 mg Oral Daily  . terazosin  5 mg Oral Daily  . Vitamin D (Ergocalciferol)  50,000 Units Oral Q7 days  . DISCONTD: therapeutic plasma exchange solution   Dialysis Once in dialysis  . DISCONTD: calcium gluconate  2 g Intravenous Once  . DISCONTD: heparin  1,000 Units Intracatheter Once   Continuous Infusions:    . citrate dextrose    . DISCONTD: citrate dextrose     PRN Meds:.sodium chloride, sodium chloride, acetaminophen, acetaminophen, acetaminophen, acetaminophen, albuterol, alteplase, alum & mag hydroxide-simeth, calcium carbonate, calcium gluconate IV, calcium gluconate IV, calcium gluconate, dextrose, diphenhydrAMINE, feeding supplement (NEPRO CARB STEADY), fentaNYL, glucose-Vitamin C, guaiFENesin-dextromethorphan, heparin, lidocaine, lidocaine-prilocaine, meclizine, menthol-cetylpyridinium, midazolam ondansetron (ZOFRAN) IV, ondansetron, pentafluoroprop-tetrafluoroeth, DISCONTD: calcium carbonate, DISCONTD: calcium gluconate IV, DISCONTD: calcium gluconate IV, DISCONTD: calcium gluconate, DISCONTD: diphenhydrAMINE  Assessment/Plan: 1. Acute on chronic kidney disease. Now associated hematuria. High titer ANA positive and anti-myeloperoxidase antibody. Nephrology suspecting microscopic polyangiitis and patient is on steroids and Cytoxan. Renal biopsy results are pending. Management per nephrology. She is status post plasma exchange. 2. Anemia status post feraheme and 2 units of packed red blood cell transfusion. Slight drop in hemoglobin compared to this morning possibly secondary to hematuria. Will recheck CBC tomorrow and transfuse if hemoglobin less than 7 g/dL. 3. Hypertension: Controlled. Continue amlodipine Coreg, hydralazine and prazosin. 4. Type 2 diabetes mellitus: Reasonable inpatient control on current insulin regimen 5. Dizziness secondary to BPPV.  Discussed with patient spouse  and daughter.     HONGALGI,ANAND 12/05/2011, 6:07 PM

## 2011-12-05 NOTE — Progress Notes (Signed)
OT Cancellation Note  Treatment cancelled today due to:  Pt. In HD. Will check back if/as time permits. Romana Juniper, COTA/L

## 2011-12-05 NOTE — Procedures (Signed)
Patient was seen on dialysis and the procedure was supervised.  BFR 250  Via PC BP is  98/39.   Patient appears to be tolerating treatment well, although some low BPs  Ryan Jimenez A 12/05/2011

## 2011-12-05 NOTE — Progress Notes (Signed)
Late Entry- On 12/04/11 @ approximately 2033 hrs pt was transported to Hemo-Dialysis for his first HD treatment.  Pt had some concerns but was willing to undergo the treatment. He stated that he has a strong faith and believed that God is with him. Pt was listened to,  supported and comforted.

## 2011-12-05 NOTE — Plan of Care (Signed)
Problem: Phase III Progression Outcomes Goal: Activity at appropriate level-compared to baseline (UP IN CHAIR FOR HEMODIALYSIS)  Outcome: Progressing Pt continues to participate in PT, however at lower functional level that PTA. Pt likely to need ST [facility]. [employer], [place] [phone] 12/05/2011 10:40 AM

## 2011-12-05 NOTE — Progress Notes (Signed)
Covering CSW met with pt's wife and daughter while pt was in dialysis to inquire about bed choice. Pt's wife shared that she prefers [facility] and would like to visit. CSW provided education around discharging to SNF. CSW will hand off to weekday CSW.  Darden Dates, MSW, [place]

## 2011-12-06 ENCOUNTER — Inpatient Hospital Stay (HOSPITAL_COMMUNITY): Payer: Medicare Other

## 2011-12-06 LAB — GLUCOSE, CAPILLARY
Glucose-Capillary: 182 mg/dL — ABNORMAL HIGH (ref 70–99)
Glucose-Capillary: 297 mg/dL — ABNORMAL HIGH (ref 70–99)
Glucose-Capillary: 314 mg/dL — ABNORMAL HIGH (ref 70–99)

## 2011-12-06 LAB — TYPE AND SCREEN
Antibody Screen: NEGATIVE
Unit division: 0

## 2011-12-06 LAB — CBC
MCHC: 34.2 g/dL (ref 30.0–36.0)
RDW: 14.6 % (ref 11.5–15.5)

## 2011-12-06 LAB — RENAL FUNCTION PANEL
Albumin: 2.9 g/dL — ABNORMAL LOW (ref 3.5–5.2)
Calcium: 7.6 mg/dL — ABNORMAL LOW (ref 8.4–10.5)
GFR calc Af Amer: 12 mL/min — ABNORMAL LOW (ref >90)
GFR calc non Af Amer: 11 mL/min — ABNORMAL LOW (ref >90)
Phosphorus: 6.2 mg/dL — ABNORMAL HIGH (ref 2.3–4.6)
Sodium: 138 mEq/L (ref 135–145)

## 2011-12-06 LAB — GLOMERULAR BASEMENT MEMBRANE ANTIBODIES

## 2011-12-06 MED ORDER — SODIUM CHLORIDE 0.9 % IV SOLN: 2.0000 g | INTRAVENOUS | Status: DC | PRN

## 2011-12-06 MED ORDER — INSULIN ASPART 100 UNIT/ML ~~LOC~~ SOLN
4.0000 [IU] | Freq: Three times a day (TID) | SUBCUTANEOUS | Status: DC
  Administered 2011-12-06 – 2011-12-17 (×28): 4 [IU] via SUBCUTANEOUS

## 2011-12-06 MED ORDER — BOOST / RESOURCE BREEZE PO LIQD
1.0000 | Freq: Three times a day (TID) | ORAL | Status: DC
  Administered 2011-12-07 – 2011-12-08 (×4): 1 via ORAL

## 2011-12-06 MED ORDER — SODIUM CHLORIDE 0.9 % IV SOLN: 4.0000 g | INTRAVENOUS | Status: DC | PRN

## 2011-12-06 MED ORDER — CALCIUM CARBONATE ANTACID 500 MG PO CHEW
2.0000 | CHEWABLE_TABLET | ORAL | Status: DC | PRN
  Administered 2011-12-07: 400 mg via ORAL

## 2011-12-06 MED ORDER — DESMOPRESSIN ACETATE 4 MCG/ML IJ SOLN
40.0000 ug | Freq: Once | INTRAMUSCULAR | Status: DC
  Filled 2011-12-06: qty 10

## 2011-12-06 MED ORDER — CALCIUM GLUCONATE 10 % IV SOLN
2.0000 g | INTRAVENOUS | Status: DC | PRN
  Administered 2011-12-07: 1000 mg via INTRAVENOUS

## 2011-12-06 MED ORDER — INSULIN GLARGINE 100 UNIT/ML ~~LOC~~ SOLN
25.0000 [IU] | Freq: Every day | SUBCUTANEOUS | Status: DC
  Administered 2011-12-07 – 2011-12-16 (×10): 25 [IU] via SUBCUTANEOUS
  Filled 2011-12-06 (×2): qty 3

## 2011-12-06 MED ORDER — ACETAMINOPHEN 325 MG PO TABS: 650.0000 mg | ORAL_TABLET | Freq: Once | ORAL | Status: DC

## 2011-12-06 MED ORDER — ACD FORMULA A 0.73-2.45-2.2 GM/100ML VI SOLN: 500.0000 mL | Status: DC

## 2011-12-06 MED ORDER — HEPARIN SODIUM (PORCINE) 1000 UNIT/ML IJ SOLN
1000.0000 [IU] | Freq: Once | INTRAMUSCULAR | Status: DC
  Filled 2011-12-06: qty 1

## 2011-12-06 MED ORDER — DIPHENHYDRAMINE HCL 25 MG PO CAPS
25.0000 mg | ORAL_CAPSULE | Freq: Once | ORAL | Status: AC
  Administered 2011-12-16: 25 mg via ORAL

## 2011-12-06 MED ORDER — DIPHENHYDRAMINE HCL 25 MG PO CAPS: 25.0000 mg | ORAL_CAPSULE | Freq: Four times a day (QID) | ORAL | Status: DC | PRN

## 2011-12-06 MED ORDER — ACETAMINOPHEN 325 MG PO TABS: 650.0000 mg | ORAL_TABLET | ORAL | Status: DC | PRN

## 2011-12-06 MED ORDER — CALCIUM CARBONATE ANTACID 500 MG PO CHEW
2.0000 | CHEWABLE_TABLET | ORAL | Status: AC
  Administered 2011-12-06: 400 mg via ORAL
  Filled 2011-12-06 (×2): qty 2

## 2011-12-06 MED ORDER — SODIUM CHLORIDE 0.9 % IV SOLN
40.0000 ug | Freq: Once | INTRAVENOUS | Status: AC
  Administered 2011-12-06: 40 ug via INTRAVENOUS
  Filled 2011-12-06: qty 10

## 2011-12-06 NOTE — Progress Notes (Signed)
Subjective: Interval History:  Second HD treatment last pm, 700 cc removed. Patient complaining of recurrence of hematuria with some new left low back/flank pain now resolved. UO increased to 1676ml in past 24 hours.  Volume status -950cc total. Had intense pain associated with passing clots yesterday. Family at bedside very concerned  Objective:  Vital signs in last 24 hours:  Temp:  [97.1 F (36.2 C)-98.4 F (36.9 C)] 98.4 F (36.9 C) (01/22 0552) Pulse Rate:  [56-70] 63  (01/22 0552) Resp:  [8-24] 18  (01/22 0552) BP: (78-156)/(39-84) 102/61 mmHg (01/22 0552) SpO2:  [92 %-99 %] 92 % (01/22 0552) Weight:  [255 lb 11.7 oz (116 kg)] 255 lb 11.7 oz (116 kg) (01/21 1800) Weight change: 3.5 oz (0.1 kg)  Intake/Output: I/O last 3 completed shifts: In: 900 [I.V.:200; Blood:700] Out: 1625 [Urine:2325]   Intake/Output Summary (Last 24 hours) at 12/06/11 0802 Last data filed at 12/05/11 2200  Gross per 24 hour  Intake      0 ml  Output    950 ml  Net   -950 ml     EXAM: HEENT: NCAT, NAD. Alert and conversant. RS- Rt basilar rales. Nonlabored.  CV: RRR, no murmurs. ABD- Nontender, BS +. Left flank non tender, no echymoses or skin changes.  EXT- Trace bilateral LE edema, nontender. Rt IJ tunneled catheter.  NEURO-a/o x3. No asterixis. No gross motor deficits.   Urinal with grossly bloody urine at bedside  Lab Results:  [facility] 12/05/11 2037 12/05/11 1628 12/05/11 0448  WBC 9.6 8.8 10.4  HGB 8.0* 8.8* 9.2*  HCT 23.5* 25.4* 27.0*  PLT 126* 174 175   BMET  Basename 12/05/11 0448 12/04/11 0500 12/03/11 0825  NA 140 138 136  K 4.0 4.0 4.2  CL 101 97 93*  CO2 29 28 27   GLUCOSE 178* 237* 384*  BUN 98* 124* 107*  CREATININE 5.29* 6.91* 6.48*  CALCIUM 8.3* 8.3* 9.2  PHOS 5.7* 7.4* 7.0*   LFT  Basename 12/05/11 0448  PROT --  ALBUMIN 2.6*  AST --  ALT --  ALKPHOS --  BILITOT --  BILIDIR --  IBILI --   PT/INR No results found for this basename: LABPROT:2,INR:2  in the last 72 hours Hepatitis Panel No results found for this basename: HEPBSAG,HCVAB,HEPAIGM,HEPBIGM in the last 72 hours PTH: Lab Results  Component Value Date   CALCIUM 8.3* 12/05/2011   PHOS 5.7* 12/05/2011  Albumin: 2.7  Anemia lab work-up: Iron: 29 Ferritin: 154 Sat: 14 Folate 7.4  B12 456  Renal lab work-up: UNa:  81 [place]: 35 UCl: 36 UCr: 50.35 UPr: 1 UA 1/10:  large RBC's, negative protein, ketones glucose, bilirubin, LE and nirtrites. Micro exam: TNTC RBC's per HPF. UA 1/11: 100 protein, 15 ketones, small bilirubin, negative blood.  C3 129 C4 31 ASO 64 Ratio UPr/Cr: 0.23 24 hr urine protein: 360 mg 24 hr urine creatinine: 1511 SPEP/UPEP- kappa 19.9, lambda 3.59, ratio 5.56, immunofixation pending  ANA- positive, 1:1280 Anti-ds DNA-36 (equivocal) Anti-myeloperoxidate positive (see paper records) HIV-negative   Studies/Results: 2D-ECHO: EF 55-60%, normal wall motion     . therapeutic plasma exchange solution   Dialysis Once in dialysis  . therapeutic plasma exchange solution   Dialysis Once in dialysis  . amLODipine  10 mg Oral Daily  . calcium gluconate  2 g Intravenous Once  . carvedilol  6.25 mg Oral BID WC  . citrate dextrose      . citrate dextrose      .  cyclophosphamide  200 mg Oral Daily  . darbepoetin      . darbepoetin (ARANESP) injection - DIALYSIS  60 mcg Intravenous Q Mon-HD  . dutasteride  0.5 mg Oral Daily  . heparin  1,000 Units Intracatheter Once  . heparin  1,000 Units Intracatheter Once  . hydrALAZINE  100 mg Oral BID  . insulin aspart  0-9 Units Subcutaneous TID AC & HS  . insulin glargine  23 Units Subcutaneous QHS  . ketotifen  1 drop Both Eyes BID  . predniSONE  60 mg Oral Q breakfast  . sevelamer  800 mg Oral TID WC  . Tamsulosin HCl  0.4 mg Oral Daily  . terazosin  5 mg Oral Daily  . Vitamin D (Ergocalciferol)  50,000 Units Oral Q7 days   Assessment/Plan: 78 yo M with uncontrolled hypertension presents with progressive  renal decline, +MPO Ab concerning for microscopic polyangiitis and hemoptysis.   1. Acute kidney disease: Baseline Cr is 0.9-1.2 from Sept 2012.   Positive labs include high titer ANA postive and anti-MPO Ab. This is concerning for microscopic polyangiitis, pending renal biopsy results. Pending lab ANCA, anti-GBM. Question if new hemoptysis may indicate pulmonary work up also. Started on solumedrol empirically 11/30/11 transitioned to prednisone 1/19. Cytoxan started 1/18 and plan for PEX treatment every other day for 2 weeks. Patient has permanent RIJ catheter, may need permanent access depending on clinical course.  2. ANEMIA-iron deficiency anemia. Patient received feraheme on 11/26/11. Hgb trending down to 7.1 after biopsy, good response with 2 units given in HD. F/u daily CBC to assess for blood loss with hematuria/hemoptysis. Aranesp weekly. Needs transfusion again today.  Will also give DDAVP 3. HTN/VOL-BP soft today,  goal SBP 140-130 on norvasc, coreg, hydralazine, terazosin. Stop norvasc, hytrin and flomax.  Give parameters on other meds 4. Dizziness: Slight interval improvement per pt report.  Management  per primary team.  5. Hyperglycemia. Lantus increased to 18units + SSI. Likely due to steroid, transition to prednisone.     LOS: [place] PGY-2 12/06/2011 8:06 AM  Patient seen and examined, agree with above note with above modifications.  Corliss Parish, MD 12/06/2011

## 2011-12-06 NOTE — Progress Notes (Addendum)
INITIAL ADULT NUTRITION ASSESSMENT Date: 12/06/2011   Time: 10:27 AM  Reason for Assessment: Poor PO intake  ASSESSMENT: Male 78 y.o.  Dx: Renal failure  Hx:  Past Medical History  Diagnosis Date  . Hypertension   . Diabetes mellitus   . Hypercholesterolemia   . Gout   . Shortness of breath on exertion   . Bronchitis   . Arthritis   . Near syncope 10/2011; 11/2011   Related Meds:     . therapeutic plasma exchange solution   Dialysis Once in dialysis  . therapeutic plasma exchange solution   Dialysis Once in dialysis  . amLODipine  10 mg Oral Daily  . calcium gluconate  2 g Intravenous Once  . carvedilol  6.25 mg Oral BID WC  . citrate dextrose      . citrate dextrose      . cyclophosphamide  200 mg Oral Daily  . darbepoetin      . darbepoetin (ARANESP) injection - DIALYSIS  60 mcg Intravenous Q Mon-HD  . dutasteride  0.5 mg Oral Daily  . heparin  1,000 Units Intracatheter Once  . heparin  1,000 Units Intracatheter Once  . hydrALAZINE  100 mg Oral BID  . insulin aspart  0-9 Units Subcutaneous TID AC & HS  . insulin glargine  23 Units Subcutaneous QHS  . ketotifen  1 drop Both Eyes BID  . predniSONE  60 mg Oral Q breakfast  . sevelamer  800 mg Oral TID WC  . Tamsulosin HCl  0.4 mg Oral Daily  . terazosin  5 mg Oral Daily  . Vitamin D (Ergocalciferol)  50,000 Units Oral Q7 days   Ht: 5\' 9"  (175.3 cm)  Wt: 255 lb 11.7 oz (116 kg)  Ideal Wt: 72.7 kg % Ideal Wt: 160%  Usual Wt: 245 lb (111.4 kg) per patient % Usual Wt: 104%  Body mass index is 37.77 kg/(m^2). Pt is obese.  Food/Nutrition Related Hx: poor appetite x months, per pt  Labs:  CMP     Component Value Date/Time   NA 138 12/06/2011 0909   K 4.1 12/06/2011 0909   CL 104 12/06/2011 0909   CO2 24 12/06/2011 0909   GLUCOSE 204* 12/06/2011 0909   BUN 89* 12/06/2011 0909   CREATININE 4.75* 12/06/2011 0909   CALCIUM 7.6* 12/06/2011 0909   PROT 6.9 11/24/2011 0530   ALBUMIN 2.9* 12/06/2011 0909   AST 18  11/24/2011 0530   ALT 9 11/24/2011 0530   ALKPHOS 46 11/24/2011 0530   BILITOT 0.4 11/24/2011 0530   GFRNONAA 11* 12/06/2011 0909   GFRAA 12* 12/06/2011 0909  Phosphorus 6.2H  CBG (last 3)   Basename 12/06/11 0749 12/05/11 2208 12/05/11 1905  GLUCAP 175* 201* 232*    Lab Results  Component Value Date   HGBA1C 6.7* 11/24/2011   Intake/Output: I/O last 3 completed shifts: In: 900 [I.V.:200; Blood:700] Out: 1625 [Urine:2325]    Diet Order: Carb Control  Supplements/Tube Feeding: Nepro prn  IVF:     citrate dextrose   Estimated Nutritional Needs:   Kcal: 1800 - 2000 kcal Protein:  80 - 100 grams Fluid:  per MD  RN reports patient has had decline in PO intake. Pt confirms this, stating that he "had a bad night last night" and is not eating well 2/2 pain. Previously eating 100%. Agreeable to supplements. This RD provided multiple supplements for patient to taste including Glucerna Shakes, Ensure, and [employer]. Pt stated Resource Gwyneth Revels was his favorite choice,  discussed this with RN and will add TID. Note that pt states appetite has been fair for a few months now, sometimes skips meals.  Pt with worsening acute on chronic CKD. HD initiated 1/20. Per chart and RN, pt has had HD twice this admission, and is not scheduled to have HD regularly. Note estimated needs not based on HD. Per renal, pt may need permanent access pending hospital course. Pending renal biopsy results. Per MD, polyangitis is suspected.   NUTRITION DIAGNOSIS: -Inadequate oral intake (NI-2.1).  Status: Ongoing  RELATED TO: poor appetite 2/2 acute illness  AS EVIDENCE BY: pt report and poor meal completion  MONITORING/EVALUATION(Goals): Goal: Pt to consume >/= 75% of meals and supplements. Unmet. Monitor: weights, labs, I/O's, PO intake  EDUCATION NEEDS: -No education needs identified at this time  INTERVENTION: 1. Resource Breeze PO TID between meals to provide additional protein and kcal 2. Will  provide dietary education at a later time, closer to d/c, pending patient's hospital course 3. RD to follow nutrition care plan and provide recommendations as able  Dietitian #: [phone]  Terrell Per approved criteria  -Obesity Unspecified    Asencion Partridge 12/06/2011, 10:27 AM

## 2011-12-06 NOTE — Procedures (Signed)
I have personally attended this patient's dialysis session. Presently initiating a 3 1/2 hour treatment, with plans for transfusion of 2 units, no heparin; Access is tunnelled catheter. BP 127/59; urine still pink tinged in foley but per patient is clearing some. No pain or other complaints at present.  Derl Abalos B

## 2011-12-06 NOTE — Progress Notes (Signed)
Inpatient Diabetes Program Recommendations  AACE/ADA: New Consensus Statement on Inpatient Glycemic Control (2009)  Target Ranges:  Prepandial:   less than 140 mg/dL      Peak postprandial:   less than 180 mg/dL (1-2 hours)      Critically ill patients:  140 - 180 mg/dL    Inpatient Diabetes Program Recommendations Insulin - Basal: . Correction (SSI): . Insulin - Meal Coverage: Add 4 units meal coverage for elevated post-prandials

## 2011-12-06 NOTE — Progress Notes (Signed)
Occupational Therapy Treatment Patient Details Name: Ryan Jimenez MRN: [id number] DOB: [birth year] Today's Date: 12/06/2011 9:26-9:53  OT Assessment/Plan OT Assessment/Plan Comments on Treatment Session: Pt not progressing today---painful night (not now), urinating blood (Hgb 8.0), soft BP (102/51-107/56), dizzy, and impulsive. OT Plan: Discharge plan needs to be updated OT Frequency: Min 2X/week Follow Up Recommendations: Skilled nursing facility Equipment Recommended: Defer to next venue OT Goals ADL Goals ADL Goal: Grooming - Progress: Not progressing ADL Goal: Toilet Transfer - Progress: Not progressing  OT Treatment Precautions/Restrictions  Precautions Precautions: Fall Precaution Comments: dizziness due to vestibular Required Braces or Orthoses: No   ADL ADL Eating/Feeding: Not assessed Grooming: Performed;Wash/dry face;Supervision/safety;Set up Where Assessed - Grooming: Sitting, bed;Unsupported Upper Body Bathing: Not assessed Lower Body Bathing: Not assessed Upper Body Dressing: Not assessed Lower Body Dressing: Not assessed Toilet Transfer: Simulated;Minimal assistance Toilet Transfer Details (indicate cue type and reason): bes to chair going to pt's right with only about 1 foot in between, pt was impulsive Toilet Transfer Method: Squat pivot Toileting - Clothing Manipulation: Not assessed Toileting - Hygiene: Not assessed Tub/Shower Transfer: Not assessed Equipment Used:  (o2 at 2 liters) Mobility  Bed Mobility Bed Mobility: Yes Rolling Right: 5: Supervision;With rail Rolling Right Details (indicate cue type and reason): min VCs for technique, pt wanted to pull up on me Right Sidelying to Sit: HOB elevated (comment degrees);With rails;5: Supervision (20 degrees) Sitting - Scoot to Edge of Bed: 5: Supervision Exercises    End of Session OT - End of Session Equipment Utilized During Treatment: Other (comment) (none) Activity Tolerance: Patient limited  by fatigue;Other (comment) (limited also by dizziness) Patient left: in chair;with call bell in reach;with family/visitor present (daughter) General Behavior During Session: Lethargic Cognition: Impaired (decreased safety awareness with transfer, decreased prob sol)  [name] [id number] 12/06/2011, 10:04 AM

## 2011-12-06 NOTE — Progress Notes (Signed)
1700  Three way foley placed - urine noted to be bloody but no clots noted at this time.  Continue to monitor  [employer] rn

## 2011-12-06 NOTE — Clinical Documentation Improvement (Signed)
Anemia Blood Loss Clarification  THIS DOCUMENT IS NOT A PERMANENT PART OF THE MEDICAL RECORD  RESPOND TO THE THIS QUERY, FOLLOW THE INSTRUCTIONS BELOW:  1. If needed, update documentation for the patient's encounter via the notes activity.  2. Access this query again and click edit on the In [employer].  3. After updating, or not, click F2 to complete all highlighted (required) fields concerning your review. Select "additional documentation in the medical record" OR "no additional documentation provided".  4. Click Sign note button.  5. The deficiency will fall out of your In Basket *Please let us know if you are not able to complete this workflow by phone or e-mail (listed below).        12/06/11  Dear Dr. Corliss Parish Rolley Sims  In an effort to better capture your patient's severity of illness, reflect appropriate length of stay and utilization of resources, a review of the patient medical record has revealed the following indicators.    Based on your clinical judgment, please clarify and document in a progress note and/or discharge summary the clinical condition associated with the following supporting information:  In responding to this query please exercise your independent judgment.  The fact that a query is asked, does not imply that any particular answer is desired or expected.  Possible Clinical Conditions?   " Expected Acute Blood Loss Anemia  " Acute Blood Loss Anemia " Acute on chronic blood loss anemia  " Chronic blood loss anemia  " Precipitous drop in Hematocrit  " Other Condition________________  " Cannot Clinically Determine  Risk Factors: (recent surgery, pre op anemia, EBL in OR)  Supporting Information:  Signs and Symptoms Per 1/21 progress note Anemia status post feraheme and 2 units of packed red blood cell transfusion. Slight drop in hemoglobin compared to this morning possibly secondary to hematuria. Will recheck CBC tomorrow and transfuse  if hemoglobin less than 7 g/dL.  Per 1/22 progress note ANEMIA-iron deficiency anemia. Patient received feraheme on 11/26/11. Hgb trending down to 7.1 after biopsy, good response with 2 units given in HD. F/u daily CBC to assess for blood loss with hematuria/hemoptysis. Aranesp weekly. Needs transfusion again today. Will also give DDAVP   Diagnostics: Component     Latest Ref Rng 11/23/2011 11/23/2011 11/23/2011 11/23/2011 11/23/2011  HGB     13.0 - 17.0 g/dL       HCT     39.0 - 52.0 %        Component     Latest Ref Rng 11/23/2011 11/23/2011 11/23/2011 11/23/2011 11/23/2011  HGB     13.0 - 17.0 g/dL  10.7 (L)     HCT     39.0 - 52.0 %  31.4 (L)      Component     Latest Ref Rng 11/23/2011 11/24/2011 11/24/2011 11/24/2011 11/24/2011  HGB     13.0 - 17.0 g/dL     10.2 (L)  HCT     39.0 - 52.0 %     31.0 (L)                          Component     Latest Ref Rng 11/26/2011 11/26/2011 11/26/2011 11/26/2011 11/26/2011  HGB     13.0 - 17.0 g/dL     9.9 (L)  HCT     39.0 - 52.0 %     29.5 (L)    Component     Latest Ref Rng 11/28/2011  11/28/2011 11/28/2011 11/28/2011 11/28/2011  HGB     13.0 - 17.0 g/dL  9.4 (L)     HCT     39.0 - 52.0 %  28.0 (L)       Component     Latest Ref Rng 11/30/2011 11/30/2011 11/30/2011 11/30/2011 11/30/2011  HGB     13.0 - 17.0 g/dL  8.8 (L)     HCT     39.0 - 52.0 %  26.4 (L)      Component     Latest Ref Rng 11/30/2011 11/30/2011 11/30/2011 11/30/2011 11/30/2011  HGB     13.0 - 17.0 g/dL    8.1 (L)   HCT     39.0 - 52.0 %    24.2 (L)    Component     Latest Ref Rng 11/30/2011 11/30/2011 11/30/2011 12/01/2011 12/01/2011  HGB     13.0 - 17.0 g/dL   9.1 (L)    HCT     39.0 - 52.0 %   26.7 (L)               Component     Latest Ref Rng 12/02/2011 12/02/2011 12/03/2011 12/03/2011 12/03/2011  HGB     13.0 - 17.0 g/dL   7.6 (L)    HCT     39.0 - 52.0 %   22.5 (L)     Component     Latest Ref Rng 12/03/2011 12/03/2011 12/03/2011 12/04/2011 12/04/2011  HGB     13.0 - 17.0 g/dL     7.1 (L)   HCT     39.0 - 52.0 %    20.8 (L)                          Component     Latest Ref Rng 12/05/2011 12/05/2011 12/05/2011 12/05/2011 12/05/2011  HGB     13.0 - 17.0 g/dL 9.2 (L)    8.8 (L)  HCT     39.0 - 52.0 % 27.0 (L)    25.4 (L)   Component     Latest Ref Rng 12/05/2011 12/05/2011 12/05/2011 12/05/2011 12/06/2011  HGB     13.0 - 17.0 g/dL   8.0 (L)    HCT     39.0 - 52.0 %   23.5 (L)     Component     Latest Ref Rng 12/06/2011 12/06/2011 12/06/2011  HGB     13.0 - 17.0 g/dL  6.4 (LL)   HCT     39.0 - 52.0 %  18.7 (L)     Reviewed: additional documentation in the medical record  Thank You,  Serena Colonel RN, BSN, CCM   Clinical Documentation Specialist:  Pager [place]

## 2011-12-06 NOTE — Progress Notes (Signed)
Subjective: Patient says he's feeling better today. Denies abdominal pain or back pain. No weakness or dizziness. Bloody urine.  Objective: Blood pressure 120/56, pulse 71, temperature 97.6 F (36.4 C), temperature source Oral, resp. rate 18, height 5\' 9"  (1.753 m), weight 116 kg (255 lb 11.7 oz), SpO2 98.00%.  Intake/Output Summary (Last 24 hours) at 12/06/11 1718 Last data filed at 12/06/11 1300  Gross per 24 hour  Intake    360 ml  Output    650 ml  Net   -290 ml    General Exam: Comfortable. Respiratory System: Clear. No increased work of breathing. Cardiovascular System: First and second heart sounds heard. Regular rate and rhythm. No JVD/murmurs.  Gastrointestinal System: Abdomen is non distended, soft and normal bowel sounds heard. Nontender Central Nervous System: Alert and oriented. No focal neurological deficits.  Lab Results: Basic Metabolic Panel:  Basename 12/06/11 0909 12/05/11 0448  NA 138 140  K 4.1 4.0  CL 104 101  CO2 24 29  GLUCOSE 204* 178*  BUN 89* 98*  CREATININE 4.75* 5.29*  CALCIUM 7.6* 8.3*  MG -- --  PHOS 6.2* 5.7*   Liver Function Tests:  Basename 12/06/11 0909 12/05/11 0448  AST -- --  ALT -- --  ALKPHOS -- --  BILITOT -- --  PROT -- --  ALBUMIN 2.9* 2.6*   No results found for this basename: LIPASE:2,AMYLASE:2 in the last 72 hours No results found for this basename: AMMONIA:2 in the last 72 hours CBC:  Basename 12/06/11 0915 12/05/11 2037  WBC 9.4 9.6  NEUTROABS -- --  HGB 6.4* 8.0*  HCT 18.7* 23.5*  MCV 77.6* 77.6*  PLT 120* 126*   Cardiac Enzymes: No results found for this basename: CKTOTAL:3,CKMB:3,CKMBINDEX:3,TROPONINI:3 in the last 72 hours BNP: No results found for this basename: PROBNP:3 in the last 72 hours D-Dimer: No results found for this basename: DDIMER:2 in the last 72 hours CBG:  Basename 12/06/11 1224 12/06/11 0749 12/05/11 2208 12/05/11 1905 12/05/11 1335 12/05/11 0725  GLUCAP 297* 175* 201* 232* 191*  147*   Hemoglobin A1C: No results found for this basename: HGBA1C in the last 72 hours Fasting Lipid Panel: No results found for this basename: CHOL,HDL,LDLCALC,TRIG,CHOLHDL,LDLDIRECT in the last 72 hours Thyroid Function Tests: No results found for this basename: TSH,T4TOTAL,FREET4,T3FREE,THYROIDAB in the last 72 hours Anemia Panel: No results found for this basename: VITAMINB12,FOLATE,FERRITIN,TIBC,IRON,RETICCTPCT in the last 72 hours Coagulation: No results found for this basename: LABPROT:2,INR:2 in the last 72 hours Urine Drug Screen: Drugs of Abuse  No results found for this basename: labopia,  cocainscrnur,  labbenz,  amphetmu,  thcu,  labbarb     Micro Results: No results found for this or any previous visit (from the past 240 hour(s)).  Studies/Results: Dg Chest 2 View  11/26/2011  *RADIOLOGY REPORT*  Clinical Data: 78 year old male with shortness of breath, suspect fluid overload.  CHEST - 2 VIEW  Comparison: 1133 hours the same day and earlier.  Findings: Seated AP and lateral views of the chest.  Slightly lower lung volumes.  Stable cardiac size and mediastinal contours.  No pneumothorax.  No overt pulmonary edema.  Crowding lung markings, especially at the left base.  No consolidation.  No acute pulmonary opacity. No acute osseous abnormality identified.  IMPRESSION: Low lung volumes, otherwise no acute cardiopulmonary abnormality.  Original Report Authenticated By: Randall An, M.D.   Dg Chest 2 View  11/26/2011  *RADIOLOGY REPORT*  Clinical Data: Shortness of breath.  Dizziness.  Lightheadedness since  11/05/2011.  History of hypertension, diabetes.  CHEST - 2 VIEW  Comparison: 11/23/2011  Findings: The heart size is normal.  There is minimal left lower lobe atelectasis.  No focal consolidations or pleural effusions are identified.  No edema.  Significant degenerate changes are seen in the thoracic spine.  IMPRESSION:  1.  Minimal left lower lobe atelectasis.  Original  Report Authenticated By: Glenice Bow, M.D.   Ct Head Wo Contrast  11/21/2011  *RADIOLOGY REPORT*  Clinical Data: Dizziness and weakness  CT HEAD WITHOUT CONTRAST  Technique:  Contiguous axial images were obtained from the base of the skull through the vertex without contrast.  Comparison: None.  Findings: No acute intracranial hemorrhage.  No focal mass lesion. No CT evidence of acute infarction.   No midline shift or mass effect.  No hydrocephalus.  Basilar cisterns are patent.  There is generalized cortical atrophy and periventricular white matter hypodensities.  Paranasal sinuses and mastoid air cells are clear.  Orbits are normal.  IMPRESSION:  1.  No acute intracranial findings. 2.  Atrophy and microvascular disease.  Original Report Authenticated By: Suzy Bouchard, M.D.   Mr Brain Wo Contrast  11/23/2011  *RADIOLOGY REPORT*  Clinical Data: Dizziness, near-syncope.  Rule out posterior circulation  CVA  MRI HEAD WITHOUT CONTRAST  Technique:  Multiplanar, multiecho pulse sequences of the brain and surrounding structures were obtained according to standard protocol without intravenous contrast.  Comparison: CT 11/21/2011  Findings: Age appropriate atrophy.  Mild chronic microvascular ischemic change in the deep white matter bilaterally.  Brainstem is intact.  Negative for acute infarct.  Negative for hemorrhage or mass lesion.  Chronic sinusitis with mucosal edema in the paranasal sinuses.  IMPRESSION: No acute intracranial abnormality.  Chronic sinusitis.  Original Report Authenticated By: Truett Perna, M.D.   US Guided Needle Placement  11/30/2011  *RADIOLOGY REPORT*  Indication: Acute on chronic renal insufficiency.  ULTRASOUND GUIDED RENAL BIOPSY  Comparison: Renal ultrasound - 11/24/2011; CTA of the abdomen pelvis - 10/14/2008  Medications: Conscious sedation administered by nursing.  Total Moderate Sedation time: 40 minutes  Complications: The patient developed small perinephric hematoma  after the second attempted biopsy, however remained asymptomatic (denied flank pain) and remained hemodynamically stable.  Procedure:  Informed written consent was obtained from the patient after a discussion of the risks, benefits and alternatives to treatment. The patient understands and consents to the procedure.  A timeout was performed prior to the initiation of the procedure.  Ultrasound scanning was performed of the left kidney, however demonstrated relatively poor sonographic window, enlarged part to the patient's body habitus and respiratory rate.  Ultrasound scanning was performed of the right kidney demonstrating an adequate sonographic window and as such, the procedure was planned.  The operative site was prepped with betadine and draped in the usual sterile fashion.  The overlying soft tissues were anesthetized with 1% lidocaine with epinephrine.  A 17 gauge core needle biopsy device was advanced into the inferior cortex of the left kidney and 2 core biopsies were obtained under direct ultrasound guidance.  Images were saved for documentation purposes.  Review of the obtained biopsy samples failed to demonstrate adequate tissue acquisition.  Repeat ultrasound scanning demonstrated a small right-sided perinephric hematoma, and while the patient remained asymptomatic (denied flank pain and remained hemodynamically stable), the decision was made to not to attempt an additional biopsy largely in part due to now poor sonographic window.  At this point, the procedure was terminated.  A  dressing was placed.  The patient tolerated the procedure well without immediate post procedural complication.  The minimal amount of acquired tissue was reviewed with pathology and deemed not substantial enough for submission.  Impression:  Unsuccessful attempted ultrasound guided right renal biopsy. Future attempts may be performed under CT guidance as clinically indicated.  Above discussed with Dr. Rosana Hoes at the time of  procedure completion.  Original Report Authenticated By: Rachel Moulds, M.D.   US Renal  11/24/2011  *RADIOLOGY REPORT*  Clinical Data:  Renal failure; diabetes and hypertension  RENAL/URINARY TRACT ULTRASOUND COMPLETE  Comparison:  None.  Findings:  Right Kidney:  Normal in size and parenchymal echogenicity. Measures 13 cm. No evidence of mass or hydronephrosis.  Left Kidney:  Normal in size and parenchymal echogenicity. Measures 12.8 cm. No evidence of mass or hydronephrosis.  Bladder:  Appears normal for degree of bladder distention.  IMPRESSION: Normal study.  Original Report Authenticated By: Duayne Cal, M.D.   US Biopsy  12/02/2011  *RADIOLOGY REPORT*  Clinical Data/Indication: Hypotension.  Elevated creatinine.  ULTRASOUND-GUIDED RANDOM RENAL CORE BIOPSY  Sedation: Versed 2 mg, Fentanyl  mcg.  Total Moderate Sedation Time: 11 minutes.  Procedure: The procedure, risks, benefits, and alternatives were explained to the patient. Questions regarding the procedure were encouraged and answered. The patient understands and consents to the procedure.  The left low back was prepped with betadine in a sterile fashion, and a sterile drape was applied covering the operative field. A mask and sterile gloves were used for the procedure.  Under sonographic guidance, two 16 gauge core biopsies of the cortex of the lower pole of the left kidney were obtained. Final imaging was performed.  Patient tolerated the procedure well without complication.  Vital sign monitoring by nursing staff during the procedure will continue as patient is in the special procedures unit for post procedure observation.  Findings: The images document guide needle placement within the lower pole cortex of the left kidney. Post biopsy images demonstrate no evidence of hemorrhage.  IMPRESSION: Successful ultrasound-guided core biopsy of the lower pole of the left kidney.  Original Report Authenticated By: Jamas Lav, M.D.   Ir US Guide  Vasc Access Right  12/02/2011  *RADIOLOGY REPORT*  Indication: Renal failure.  PROCEDURE(S): FLUOROSCOPIC AND ULTRASOUND GUIDED PLACEMENT OF A TUNNELED DIAYSIS CATHETER  Physician:  Stephan Minister. Henn, MD  Medications: Versed 1.5 mg, Fentanyl 50 mcg. A radiology nurse monitored the patient for moderate sedation.  As antibiotic prophylaxis, Ancef 1 gm was ordered pre-procedure and administered intravenously within one hour of incision.  Moderate sedation time: 23 minutes  Fluoroscopy time: 0.48minutes  Procedure:Informed consent was obtained for placement of a tunneled dialysis catheter.  The patient was placed supine on the interventional table.  Ultrasound confirmed a patent right internal jugularvein.  Ultrasound images were obtained for documentation. The right neck was prepped and draped in a sterile fashion.  The right neck was anesthetized with 1% lidocaine.  Maximal barrier sterile technique was utilized including caps, mask, sterile gowns, sterile gloves, sterile drape, hand hygiene and skin antiseptic.  A small incision was made with #11 blade scalpel.  A 21 gauge needle directed into the right internal jugular vein with ultrasound guidance.  A micropuncture dilator set was placed.  A 23 cm tip to cuff HemoSplit catheter was selected.  The skin below the right clavicle was anesthetized and a small incision was made with a #11 blade scalpel.  A subcutaneous tunnel was  formed to the vein dermatotomy site.  The catheter was brought through the tunnel. The vein dermatotomy site was dilated to accommodate a peel-away sheath.  The catheter was placed through the peel-away sheath and directed into the central venous structures.  The tip of the catheter was placed at the cavoatrial junction with fluoroscopy. Fluoroscopic images were obtained for documentation.  Both lumens were found to aspirate and flush well.  The proper amount of heparin was flushed in both lumens.  The vein dermatotomy site was closed using a single  layer of absorbable suture and Dermabond. The catheter was secured to the skin using Prolene suture.  Findings:Catheter tip at the cavoatrial junction.  Complications: None  Impression:Successful placement of a right chest tunneled dialysis catheter using ultrasound and fluoroscopic guidance.  Original Report Authenticated By: Markus Daft, M.D.   Dg Chest Port 1 View  11/24/2011  *RADIOLOGY REPORT*  Clinical Data: Short of breath  PORTABLE CHEST - 1 VIEW  Comparison: Chest radiograph 05/18/2010  Findings: Normal mediastinum and heart silhouette.  Lungs appear clear.  No effusion, infiltrate, pneumothorax.  No acute osseous abnormality.  IMPRESSION: No acute cardiopulmonary process.  Original Report Authenticated By: Suzy Bouchard, M.D.   Ir Central Line Right  12/02/2011  *RADIOLOGY REPORT*  Indication: Renal failure.  PROCEDURE(S): FLUOROSCOPIC AND ULTRASOUND GUIDED PLACEMENT OF A TUNNELED DIAYSIS CATHETER  Physician:  Stephan Minister. Henn, MD  Medications: Versed 1.5 mg, Fentanyl 50 mcg. A radiology nurse monitored the patient for moderate sedation.  As antibiotic prophylaxis, Ancef 1 gm was ordered pre-procedure and administered intravenously within one hour of incision.  Moderate sedation time: 23 minutes  Fluoroscopy time: 0.75minutes  Procedure:Informed consent was obtained for placement of a tunneled dialysis catheter.  The patient was placed supine on the interventional table.  Ultrasound confirmed a patent right internal jugularvein.  Ultrasound images were obtained for documentation. The right neck was prepped and draped in a sterile fashion.  The right neck was anesthetized with 1% lidocaine.  Maximal barrier sterile technique was utilized including caps, mask, sterile gowns, sterile gloves, sterile drape, hand hygiene and skin antiseptic.  A small incision was made with #11 blade scalpel.  A 21 gauge needle directed into the right internal jugular vein with ultrasound guidance.  A micropuncture dilator set  was placed.  A 23 cm tip to cuff HemoSplit catheter was selected.  The skin below the right clavicle was anesthetized and a small incision was made with a #11 blade scalpel.  A subcutaneous tunnel was formed to the vein dermatotomy site.  The catheter was brought through the tunnel. The vein dermatotomy site was dilated to accommodate a peel-away sheath.  The catheter was placed through the peel-away sheath and directed into the central venous structures.  The tip of the catheter was placed at the cavoatrial junction with fluoroscopy. Fluoroscopic images were obtained for documentation.  Both lumens were found to aspirate and flush well.  The proper amount of heparin was flushed in both lumens.  The vein dermatotomy site was closed using a single layer of absorbable suture and Dermabond. The catheter was secured to the skin using Prolene suture.  Findings:Catheter tip at the cavoatrial junction.  Complications: None  Impression:Successful placement of a right chest tunneled dialysis catheter using ultrasound and fluoroscopic guidance.  Original Report Authenticated By: Markus Daft, M.D.    Medications: Scheduled Meds:    . acetaminophen  650 mg Oral Once  . calcium carbonate  2 tablet Oral Q3H  .  calcium gluconate  2 g Intravenous Once  . carvedilol  6.25 mg Oral BID WC  . citrate dextrose      . citrate dextrose      . cyclophosphamide  200 mg Oral Daily  . darbepoetin      . darbepoetin (ARANESP) injection - DIALYSIS  60 mcg Intravenous Q Mon-HD  . desmopressin (DDAVP) IV  40 mcg Intravenous Once  . diphenhydrAMINE  25 mg Oral Once  . dutasteride  0.5 mg Oral Daily  . feeding supplement  1 Container Oral TID BM  . heparin  1,000 Units Intracatheter Once  . hydrALAZINE  100 mg Oral BID  . insulin aspart  0-9 Units Subcutaneous TID AC & HS  . insulin glargine  23 Units Subcutaneous QHS  . ketotifen  1 drop Both Eyes BID  . predniSONE  60 mg Oral Q breakfast  . sevelamer  800 mg Oral TID WC    . Vitamin D (Ergocalciferol)  50,000 Units Oral Q7 days  . DISCONTD: amLODipine  10 mg Oral Daily  . DISCONTD: desmopressin  40 mcg Intravenous Once  . DISCONTD: heparin  1,000 Units Intracatheter Once  . DISCONTD: heparin  1,000 Units Intracatheter Once  . DISCONTD: Tamsulosin HCl  0.4 mg Oral Daily  . DISCONTD: terazosin  5 mg Oral Daily   Continuous Infusions:    . citrate dextrose    . DISCONTD: citrate dextrose     PRN Meds:.sodium chloride, sodium chloride, acetaminophen, acetaminophen, acetaminophen, acetaminophen, albuterol, alum & mag hydroxide-simeth, calcium carbonate, calcium gluconate IV, calcium gluconate IV, calcium gluconate, dextrose, diphenhydrAMINE, feeding supplement (NEPRO CARB STEADY), fentaNYL, glucose-Vitamin C, guaiFENesin-dextromethorphan, heparin, lidocaine, lidocaine-prilocaine, meclizine, menthol-cetylpyridinium, midazolam ondansetron (ZOFRAN) IV, ondansetron, pentafluoroprop-tetrafluoroeth, DISCONTD: acetaminophen, DISCONTD: calcium carbonate, DISCONTD: calcium gluconate IV, DISCONTD: calcium gluconate IV, DISCONTD: calcium gluconate, DISCONTD: diphenhydrAMINE, DISCONTD: fentaNYL  Assessment/Plan: 1. Acute on chronic kidney disease. Nephrology suspecting microscopic polyangiitis and patient is on steroids and Cytoxan. Renal biopsy results are pending. Management per nephrology. Discussed with Dr. Corliss Parish regarding accepting patient on their primary service due to primarily nephrology related issues. She has kindly agreed. 2. Anemia status post feraheme and 2 units of packed red blood cell transfusion. Hemoglobin has again dropped, most likely secondary to ongoing hematuria. Apparently scheduled for blood transfusion across hemodialysis. Patient is also on weekly Aranesp and will get DDAVP. 3. Hypertension: Per nephrology. Some of the medications are being discontinued secondary to soft blood pressures. 4. Type 2 diabetes mellitus: Uncontrolled, likely  secondary to steroids. Will adjust Lantus to 25 units at bedtime and add mealtime NovoLog 4 units 3 times a day with meals. Obviously if the steroids are decreased then his insulin requirement may decrease and we'll need to be readjusted. 5. Dizziness secondary to BPPV.  Discussed with patient spouse and daughter.  The hospitalist service will sign off at this time. Please call for any further assistance.     Azari Hasler 12/06/2011, 5:18 PM

## 2011-12-06 NOTE — Progress Notes (Signed)
CRITICAL VALUE ALERT  Critical value received:  hgb 6.4  Date of notification:  12/06/11  Time of notification:  0800  Critical value read back:yes  Nurse who received alert:  Raymon Mutton rn  MD notified (1st page):  Dr Algis Liming and dr Moshe Cipro  Time of first page:  0815  MD notified (2nd page):  Time of second page:  Responding MD:  Dr Algis Liming and dr Moshe Cipro  Time MD responded:  [phone]

## 2011-12-07 ENCOUNTER — Inpatient Hospital Stay (HOSPITAL_COMMUNITY): Payer: Medicare Other

## 2011-12-07 LAB — THERAPEUTIC PLASMA EXCHANGE (BLOOD BANK)
Unit division: 0
Unit division: 0
Unit division: 0
Unit division: 0
Unit division: 0
Unit division: 0
Unit division: 0
Unit division: 0

## 2011-12-07 LAB — CBC
MCHC: 34.4 g/dL (ref 30.0–36.0)
Platelets: 115 10*3/uL — ABNORMAL LOW (ref 150–400)
RDW: 14.5 % (ref 11.5–15.5)

## 2011-12-07 LAB — RENAL FUNCTION PANEL
CO2: 28 mEq/L (ref 19–32)
Calcium: 8.2 mg/dL — ABNORMAL LOW (ref 8.4–10.5)
GFR calc Af Amer: 18 mL/min — ABNORMAL LOW (ref >90)
GFR calc non Af Amer: 15 mL/min — ABNORMAL LOW (ref >90)
Phosphorus: 4.3 mg/dL (ref 2.3–4.6)
Sodium: 142 mEq/L (ref 135–145)

## 2011-12-07 LAB — GLUCOSE, CAPILLARY

## 2011-12-07 MED ORDER — CALCIUM CARBONATE ANTACID 500 MG PO CHEW
CHEWABLE_TABLET | ORAL | Status: AC
  Administered 2011-12-07: 400 mg via ORAL
  Filled 2011-12-07: qty 2

## 2011-12-07 MED ORDER — CALCIUM GLUCONATE 10 % IV SOLN
INTRAVENOUS | Status: AC
  Administered 2011-12-07: 1000 mg via INTRAVENOUS
  Filled 2011-12-07: qty 10

## 2011-12-07 MED ORDER — ACD FORMULA A 0.73-2.45-2.2 GM/100ML VI SOLN
Status: AC
  Filled 2011-12-07: qty 1000

## 2011-12-07 MED ORDER — DOCUSATE SODIUM 100 MG PO CAPS
100.0000 mg | ORAL_CAPSULE | Freq: Two times a day (BID) | ORAL | Status: DC | PRN
  Administered 2011-12-07 – 2011-12-11 (×4): 100 mg via ORAL
  Filled 2011-12-07 (×4): qty 1

## 2011-12-07 NOTE — Progress Notes (Addendum)
Subjective: Interval History:  Third HD treatment last pm, even balance. Hematuria improved and left low back/flank pain now resolved. No hemoptysis overnight. UO 994ml in past 24 hours.  Volume status +840cc total. Wife and pt anxious to hear biopsy report. Urine has cleared of blood.  BP and sugar OK.  Making reasonable amounts of urine  Objective:  Vital signs in last 24 hours:  Temp:  [97.3 F (36.3 C)-98.7 F (37.1 C)] 97.4 F (36.3 C) (01/23 0555) Pulse Rate:  [59-77] 67  (01/23 0555) Resp:  [15-23] 15  (01/23 0555) BP: (102-152)/(51-77) 134/66 mmHg (01/23 0555) SpO2:  [82 %-99 %] 95 % (01/23 0555) Weight:  [257 lb 11.5 oz (116.9 kg)] 257 lb 11.5 oz (116.9 kg) (01/22 2233) Weight change: 1 lb 15.7 oz (0.9 kg)  Intake/Output: I/O last 3 completed shifts: In: [id number] [P.O.:840; I.V.:200; Blood:700] Out: 1600 [Urine:1600]   Intake/Output Summary (Last 24 hours) at 12/07/11 0802 Last data filed at 12/06/11 2230  Gross per 24 hour  Intake   1740 ml  Output    900 ml  Net    840 ml     EXAM: HEENT: NCAT, NAD. Alert and conversant. RS- Rt basilar rales. Nonlabored.  CV: RRR, no murmurs. ABD- Nontender, BS +. Left flank non tender, no echymoses or skin changes.  EXT- Trace bilateral LE edema, nontender. Rt IJ tunneled catheter.  NEURO-a/o x3. No asterixis. No gross motor deficits.   Foley with urine appears dark, less bloody than yesterday.  Lab Results:  Basename 12/07/11 0505 12/06/11 0915 12/05/11 2037  WBC 11.1* 9.4 9.6  HGB 7.7* 6.4* 8.0*  HCT 22.4* 18.7* 23.5*  PLT 115* 120* 126*   BMET  Basename 12/07/11 0505 12/06/11 0909 12/05/11 0448  NA 142 138 140  K 4.6 4.1 4.0  CL 104 104 101  CO2 28 24 29   GLUCOSE 170* 204* 178*  BUN 56* 89* 98*  CREATININE 3.54* 4.75* 5.29*  CALCIUM 8.2* 7.6* 8.3*  PHOS 4.3 6.2* 5.7*   LFT  Basename 12/07/11 0505  PROT --  ALBUMIN 3.2*  AST --  ALT --  ALKPHOS --  BILITOT --  BILIDIR --  IBILI --   PT/INR No  results found for this basename: LABPROT:2,INR:2 in the last 72 hours Hepatitis Panel No results found for this basename: HEPBSAG,HCVAB,HEPAIGM,HEPBIGM in the last 72 hours PTH: Lab Results  Component Value Date   CALCIUM 8.2* 12/07/2011   PHOS 4.3 12/07/2011  Albumin: 2.7  Anemia lab work-up: Iron: 29 Ferritin: 154 Sat: 14 Folate 7.4  B12 456  Renal lab work-up: UNa:  81 [place]: 35 UCl: 36 UCr: 50.35 UPr: 1 UA 1/10:  large RBC's, negative protein, ketones glucose, bilirubin, LE and nirtrites. Micro exam: TNTC RBC's per HPF. UA 1/11: 100 protein, 15 ketones, small bilirubin, negative blood.  C3 129 C4 31 ASO 64 Ratio UPr/Cr: 0.23 24 hr urine protein: 360 mg 24 hr urine creatinine: 1511 SPEP/UPEP- kappa 19.9, lambda 3.59, ratio 5.56, immunofixation pending  ANA- positive, 1:1280 Anti-ds DNA-36 (equivocal) Anti-myeloperoxidate positive (see paper records) c-ANCA negative p-ANCA positive (paper chart) Anti-GBM negative HIV-negative   Studies/Results: 2D-ECHO: EF 55-60%, normal wall motion     . acetaminophen  650 mg Oral Once  . calcium carbonate  2 tablet Oral Q3H  . calcium gluconate  2 g Intravenous Once  . carvedilol  6.25 mg Oral BID WC  . cyclophosphamide  200 mg Oral Daily  . darbepoetin (ARANESP) injection - DIALYSIS  60  mcg Intravenous Q Mon-HD  . desmopressin (DDAVP) IV  40 mcg Intravenous Once  . diphenhydrAMINE  25 mg Oral Once  . dutasteride  0.5 mg Oral Daily  . feeding supplement  1 Container Oral TID BM  . heparin  1,000 Units Intracatheter Once  . hydrALAZINE  100 mg Oral BID  . insulin aspart  0-9 Units Subcutaneous TID AC & HS  . insulin aspart  4 Units Subcutaneous TID WC  . insulin glargine  25 Units Subcutaneous QHS  . ketotifen  1 drop Both Eyes BID  . predniSONE  60 mg Oral Q breakfast  . sevelamer  800 mg Oral TID WC  . Vitamin D (Ergocalciferol)  50,000 Units Oral Q7 days  . DISCONTD: amLODipine  10 mg Oral Daily  . DISCONTD:  desmopressin  40 mcg Intravenous Once  . DISCONTD: heparin  1,000 Units Intracatheter Once  . DISCONTD: heparin  1,000 Units Intracatheter Once  . DISCONTD: insulin glargine  23 Units Subcutaneous QHS  . DISCONTD: Tamsulosin HCl  0.4 mg Oral Daily  . DISCONTD: terazosin  5 mg Oral Daily   Biopsy shows necrotizing GN with crescents, all glomeruli involved.   Assessment/Plan: 78 yo M with uncontrolled hypertension presents with progressive renal decline, +MPO Ab, +p-ANCA and hemoptysis, concerning for microscopic polyangiitis.   1. Acute kidney disease: Baseline Cr is 0.9-1.2 from Sept 2012.   Positive labs include high titer ANA postive and anti-MPO Ab. This is concerning for microscopic polyangiitis, pending renal biopsy results.  Likely new hemoptysis related to vasculitic process. Started on solumedrol empirically 11/30/11 transitioned to prednisone 1/19. Cytoxan started 1/18 and plan for PEX treatment every other day for 2 weeks. Patient has permanent RIJ catheter, may need permanent access depending on clinical course. On phos binder, vit D q weekly. 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  Has tolerated treatment to date.  Continue steroids/cytoxan/PP for another 4 treatments. HD as needed 2. ANEMIA-iron deficiency anemia. Patient received feraheme on 11/26/11. Hgb trending down to 7.1 after biopsy and hematuria, good response with 2 units given in HD now twice. F/u daily CBC to assess for blood loss with hematuria/hemoptysis. Aranesp weekly.  DDAVP x1 1/22. Better controlled Patient is noted to have ABL anemia from gross hematuria and recent kidney biopsy 3. HTN/VOL-BP stable off home antihypertensive norvasc. Hold for SBP <120 on coreg, hydralazine, terazosin. Goal SBP 140-130.  4. Dizziness: Improvement per pt report.   5. DM. Decent control on Lantus increased to 25units + Novolog 4 units wc + SSI. Increased needs likely due to prednisone.     LOS:  [place] PGY-2 12/07/2011 8:02 AM  Patient seen and examined, agree with above note with above modifications.  Corliss Parish, MD 12/07/2011

## 2011-12-07 NOTE — Procedures (Signed)
Patient was seen on plasmapheresis and the procedure was supervised.  BFR n/a  Via PC BP is  150/63.   Patient appears to be tolerating treatment well  Ryan Jimenez A 12/07/2011

## 2011-12-08 LAB — GLUCOSE, CAPILLARY
Glucose-Capillary: 296 mg/dL — ABNORMAL HIGH (ref 70–99)
Glucose-Capillary: 117 mg/dL — ABNORMAL HIGH (ref 70–99)
Glucose-Capillary: 241 mg/dL — ABNORMAL HIGH (ref 70–99)

## 2011-12-08 LAB — CBC
Hemoglobin: 7.4 g/dL — ABNORMAL LOW (ref 13.0–17.0)
MCH: 27.3 pg (ref 26.0–34.0)
MCHC: 33.8 g/dL (ref 30.0–36.0)
MCV: 80.8 fL (ref 78.0–100.0)
Platelets: 118 10*3/uL — ABNORMAL LOW (ref 150–400)
RBC: 2.71 MIL/uL — ABNORMAL LOW (ref 4.22–5.81)

## 2011-12-08 LAB — RENAL FUNCTION PANEL
Albumin: 3.1 g/dL — ABNORMAL LOW (ref 3.5–5.2)
BUN: 73 mg/dL — ABNORMAL HIGH (ref 6–23)
Calcium: 8.5 mg/dL (ref 8.4–10.5)
Chloride: 100 mEq/L (ref 96–112)
Creatinine, Ser: 4.4 mg/dL — ABNORMAL HIGH (ref 0.50–1.35)
GFR calc non Af Amer: 12 mL/min — ABNORMAL LOW (ref >90)

## 2011-12-08 MED ORDER — CALCIUM CARBONATE ANTACID 500 MG PO CHEW: 2.0000 | CHEWABLE_TABLET | ORAL | Status: DC | PRN

## 2011-12-08 MED ORDER — HEPARIN SODIUM (PORCINE) 1000 UNIT/ML IJ SOLN
1000.0000 [IU] | Freq: Once | INTRAMUSCULAR | Status: DC
  Filled 2011-12-08: qty 1

## 2011-12-08 MED ORDER — SODIUM CHLORIDE 0.9 % IV SOLN: 4.0000 g | INTRAVENOUS | Status: DC | PRN

## 2011-12-08 MED ORDER — ACETAMINOPHEN 325 MG PO TABS: 650.0000 mg | ORAL_TABLET | ORAL | Status: DC | PRN

## 2011-12-08 MED ORDER — DIPHENHYDRAMINE HCL 25 MG PO CAPS: 25.0000 mg | ORAL_CAPSULE | Freq: Four times a day (QID) | ORAL | Status: DC | PRN

## 2011-12-08 MED ORDER — ACD FORMULA A 0.73-2.45-2.2 GM/100ML VI SOLN: 500.0000 mL | Status: DC

## 2011-12-08 MED ORDER — CALCIUM GLUCONATE 10 % IV SOLN: 2.0000 g | INTRAVENOUS | Status: DC | PRN

## 2011-12-08 MED ORDER — SODIUM CHLORIDE 0.9 % IV SOLN
Freq: Once | INTRAVENOUS | Status: DC
  Filled 2011-12-08: qty 200

## 2011-12-08 MED ORDER — HEPARIN SODIUM (PORCINE) 5000 UNIT/ML IJ SOLN
5000.0000 [IU] | Freq: Three times a day (TID) | INTRAMUSCULAR | Status: DC
  Administered 2011-12-08 – 2011-12-15 (×18): 5000 [IU] via SUBCUTANEOUS
  Filled 2011-12-08 (×24): qty 1

## 2011-12-08 MED ORDER — BOOST / RESOURCE BREEZE PO LIQD
1.0000 | Freq: Two times a day (BID) | ORAL | Status: DC
  Administered 2011-12-08 – 2011-12-17 (×15): 1 via ORAL

## 2011-12-08 MED ORDER — CALCIUM CARBONATE ANTACID 500 MG PO CHEW
2.0000 | CHEWABLE_TABLET | ORAL | Status: AC
  Administered 2011-12-08 (×2): 400 mg via ORAL
  Filled 2011-12-08 (×2): qty 2

## 2011-12-08 NOTE — Progress Notes (Signed)
Subjective: Interval History:  S/p 3 HD treatments and now 3 PEX treatments-tolerated well. Gross hematuria resolved and left low back/flank pain now resolved. No hemoptysis overnight. UO 1075 ml in past 24 hours.   Patient c/o weakness when ambulating, working with PT to his ability. Having some right posterior ankle soreness.   Objective:  Vital signs in last 24 hours:  Temp:  [97.4 F (36.3 C)-98.4 F (36.9 C)] 97.7 F (36.5 C) (01/24 0506) Pulse Rate:  [62-94] 62  (01/24 0506) Resp:  [16-24] 18  (01/24 0506) BP: (139-163)/(54-109) 163/73 mmHg (01/24 0506) SpO2:  [95 %-100 %] 98 % (01/24 0506) Weight:  [255 lb 15.3 oz (116.1 kg)] 255 lb 15.3 oz (116.1 kg) (01/23 2300) Weight change: -1 lb 12.2 oz (-0.8 kg)  Intake/Output: I/O last 3 completed shifts: In: [id number] [P.O.:840; I.V.:203; Blood:700] Out: 1075 [Urine:1075]   Intake/Output Summary (Last 24 hours) at 12/08/11 0749 Last data filed at 12/08/11 0700  Gross per 24 hour  Intake    843 ml  Output   1075 ml  Net   -232 ml     EXAM: HEENT: NCAT, NAD. Alert and conversant. RS- Rt basilar rales. Nonlabored.  CV: RRR, no murmurs. ABD- Nontender, BS +. Left flank non tender, no echymoses or skin changes.  EXT- Trace symmetric bilateral LE edema. Rt IJ tunneled catheter. Right posterior/inferior gastroc soreness. Skin darkened in the area.  NEURO-a/o x3. No asterixis. No gross motor deficits.   Foley with urine appears yellow.  Lab Results:  Basename 12/08/11 0510 12/07/11 0505 12/06/11 0915  WBC 11.1* 11.1* 9.4  HGB 7.4* 7.7* 6.4*  HCT 21.9* 22.4* 18.7*  PLT 118* 115* 120*   BMET  Basename 12/08/11 0510 12/07/11 0505 12/06/11 0909  NA 141 142 138  K 3.8 4.6 4.1  CL 100 104 104  CO2 33* 28 24  GLUCOSE 170* 170* 204*  BUN 73* 56* 89*  CREATININE 4.40* 3.54* 4.75*  CALCIUM 8.5 8.2* 7.6*  PHOS 4.9* 4.3 6.2*   LFT  Basename 12/08/11 0510  PROT --  ALBUMIN 3.1*  AST --  ALT --  ALKPHOS --  BILITOT --    BILIDIR --  IBILI --   PT/INR No results found for this basename: LABPROT:2,INR:2 in the last 72 hours Hepatitis Panel No results found for this basename: HEPBSAG,HCVAB,HEPAIGM,HEPBIGM in the last 72 hours PTH: Lab Results  Component Value Date   CALCIUM 8.5 12/08/2011   PHOS 4.9* 12/08/2011  Albumin: 2.7  Anemia lab work-up: Iron: 29 Ferritin: 154 Sat: 14 Folate 7.4  B12 456  Renal lab work-up: UNa:  81 [place]: 35 UCl: 36 UCr: 50.35 UPr: 1 UA 1/10:  large RBC's, negative protein, ketones glucose, bilirubin, LE and nirtrites. Micro exam: TNTC RBC's per HPF. UA 1/11: 100 protein, 15 ketones, small bilirubin, negative blood.  C3 129 C4 31 ASO 64 Ratio UPr/Cr: 0.23 24 hr urine protein: 360 mg 24 hr urine creatinine: 1511 SPEP/UPEP- kappa 19.9, lambda 3.59, ratio 5.56, immunofixation pending  ANA- positive, 1:1280 Anti-ds DNA-36 (equivocal) Anti-myeloperoxidate positive (see paper records) c-ANCA negative p-ANCA positive (paper chart) Anti-GBM negative HIV-negative   Studies/Results: 2D-ECHO: EF 55-60%, normal wall motion     . acetaminophen  650 mg Oral Once  . calcium gluconate  2 g Intravenous Once  . carvedilol  6.25 mg Oral BID WC  . citrate dextrose      . cyclophosphamide  200 mg Oral Daily  . darbepoetin (ARANESP) injection - DIALYSIS  60  mcg Intravenous Q Mon-HD  . diphenhydrAMINE  25 mg Oral Once  . dutasteride  0.5 mg Oral Daily  . feeding supplement  1 Container Oral TID BM  . heparin  1,000 Units Intracatheter Once  . hydrALAZINE  100 mg Oral BID  . insulin aspart  0-9 Units Subcutaneous TID AC & HS  . insulin aspart  4 Units Subcutaneous TID WC  . insulin glargine  25 Units Subcutaneous QHS  . ketotifen  1 drop Both Eyes BID  . predniSONE  60 mg Oral Q breakfast  . sevelamer  800 mg Oral TID WC  . Vitamin D (Ergocalciferol)  50,000 Units Oral Q7 days   Biopsy shows necrotizing GN with crescents, all glomeruli  involved.   Assessment/Plan: 78 yo M with uncontrolled hypertension presents with progressive renal decline, +MPO Ab, +p-ANCA and hemoptysis, concerning for microscopic polyangiitis.   1. Acute kidney disease: Baseline Cr is 0.9-1.2 from Sept 2012.   Positive labs include high titer ANA, +p-ANCA and anti-MPO Ab. This is concerning for microscopic polyangiitis with renal biopsy results consistent.  Likely new hemoptysis related to vasculitic process. Started on solumedrol empirically 11/30/11 transitioned to prednisone 1/19. Cytoxan started 1/18 and plan for PEX treatment every other day for 2 weeks. 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  Has tolerated treatment to date.  Continue steroids/cytoxan/PP for another 4 treatments. HD as needed, will hold off today and see what he does, given good UOP. Patient has permanent RIJ catheter, may need permanent access depending on clinical course.  2. ANEMIA-iron deficiency anemia and ABL anemia from gross hematuria and recent kidney biopsy. Patient received feraheme on 11/26/11. Hgb trending down to 7.1 after biopsy and hematuria, good response with 2 units given in HD now twice. Slowly downtrending again but gross hematuria resolved. F/u daily CBC to assess for blood loss with hematuria/hemoptysis. Aranesp weekly.  DDAVP x1 1/22. Consider pRBC transfusion with HD today for goal Hgb >7.5. 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  4. HTN/VOL-BP somewhat elevated off home antihypertensive norvasc. Medication held for soft BP after last HD treatment. Hold for SBP <120 on coreg, hydralazine, terazosin. Goal SBP 140-130.  5. Dizziness: Improvement per pt report.   6. DM. Decent control on Lantus increased to 25units + Novolog 4 units wc + SSI. Increased needs likely due to prednisone. Nutrition following. Resource supplement prn. 7. Thrombocytopenia. Plt slowly downtrending since admission. Highest in 2 years has been 160s. Stable overnight  at 118. Possibly due to cytotoxic medication. Will avoid heparin in setting of acute blood loss. Monitor daily. 8. PPX. Concern for new calf soreness. No anticoagulation due to active bleeding. SCDs are off patient though ordered previously. COnsider LE doppler and restart SCDs. Order incentive spirometer to bedside. Resume sq heparin and follow clinically,  If still painful in AM will check doppler 9. Dispo. PT/OT following. Patient from [place] but planning to stay in area during recovery period. Will likely remain hospitalized throughout treatment course, but may need rehab with weakness and debilitation from extended stay in this elderly patient.   LOS: [place] PGY-2 12/08/2011 7:49 AM  Patient seen and examined, agree with above note with above modifications.  Corliss Parish, MD 12/08/2011

## 2011-12-08 NOTE — Progress Notes (Signed)
Inpatient Diabetes Program Recommendations  AACE/ADA: New Consensus Statement on Inpatient Glycemic Control (2009)  Target Ranges:  Prepandial:   less than 140 mg/dL      Peak postprandial:   less than 180 mg/dL (1-2 hours)      Critically ill patients:  140 - 180 mg/dL   CBGs 01/23: 144/ 238/ 277/ 296 mg/dl Pt having elevated postprandial CBGs likely due to PO Prednisone.  Inpatient Diabetes Program Recommendations Insulin - Basal: Currently on Lantus 25 units QHS- Fasting sugars WNL Correction (SSI): Currently on Sensitive SSI- Agree Insulin - Meal Coverage: Please increase Novolog meal coverage to 6 units tid with meals while pt on PO Prednisone.  Note: Will follow.

## 2011-12-08 NOTE — Progress Notes (Signed)
12/07/11  1000  Family had inquired about getting medical records from another hospital for this patient.  In speaking with Dr Moshe Cipro, she stated that she is not in need of any records from another hospital at this time.  I attempted to reach the family member that requested the records but was unable to reach her.  I will attempt to reach that family member again - and will talk to the family about the fact that the doctor was not in need of additional records at this time.  Continue to monitor patient.  Raymon Mutton rn

## 2011-12-08 NOTE — Progress Notes (Signed)
Nutrition Follow-up  Pt is now s/p 3 HD treatments. Last HD treatment 1/22. Noted that pt is now officially dx with Anca positive polyangiitis/vasculitis. HD as needed. Eating 100% of meals now, appetite significantly improved from previous visit. Pt states that his appetite is not back to baseline, but much improved.  Diet Order:  CHO Modified Medium Supplement: Resource Breeze PO TID between meals  Meds: Scheduled Meds:   . therapeutic plasma exchange solution   Dialysis Once in dialysis  . calcium carbonate  2 tablet Oral Q3H  . calcium gluconate  2 g Intravenous Once  . carvedilol  6.25 mg Oral BID WC  . citrate dextrose      . cyclophosphamide  200 mg Oral Daily  . darbepoetin (ARANESP) injection - DIALYSIS  60 mcg Intravenous Q Mon-HD  . diphenhydrAMINE  25 mg Oral Once  . dutasteride  0.5 mg Oral Daily  . feeding supplement  1 Container Oral TID BM  . heparin  1,000 Units Intracatheter Once  . heparin subcutaneous  5,000 Units Subcutaneous Q8H  . hydrALAZINE  100 mg Oral BID  . insulin aspart  0-9 Units Subcutaneous TID AC & HS  . insulin aspart  4 Units Subcutaneous TID WC  . insulin glargine  25 Units Subcutaneous QHS  . ketotifen  1 drop Both Eyes BID  . predniSONE  60 mg Oral Q breakfast  . sevelamer  800 mg Oral TID WC  . Vitamin D (Ergocalciferol)  50,000 Units Oral Q7 days  . DISCONTD: acetaminophen  650 mg Oral Once  . DISCONTD: heparin  1,000 Units Intracatheter Once   Continuous Infusions:   . citrate dextrose    . DISCONTD: citrate dextrose     PRN Meds:.sodium chloride, sodium chloride, acetaminophen, acetaminophen, acetaminophen, albuterol, alum & mag hydroxide-simeth, calcium carbonate, calcium gluconate IV, calcium gluconate IV, calcium gluconate, dextrose, diphenhydrAMINE, docusate sodium, feeding supplement (NEPRO CARB STEADY), fentaNYL, glucose-Vitamin C, guaiFENesin-dextromethorphan, heparin, lidocaine, lidocaine-prilocaine, meclizine,  menthol-cetylpyridinium, midazolam ondansetron (ZOFRAN) IV, ondansetron, pentafluoroprop-tetrafluoroeth, DISCONTD: acetaminophen, DISCONTD: acetaminophen, DISCONTD: calcium carbonate, DISCONTD: calcium gluconate IV, DISCONTD: calcium gluconate IV, DISCONTD: calcium gluconate, DISCONTD: diphenhydrAMINE  Labs:  CMP     Component Value Date/Time   NA 141 12/08/2011 0510   K 3.8 12/08/2011 0510   CL 100 12/08/2011 0510   CO2 33* 12/08/2011 0510   GLUCOSE 170* 12/08/2011 0510   BUN 73* 12/08/2011 0510   CREATININE 4.40* 12/08/2011 0510   CALCIUM 8.5 12/08/2011 0510   PROT 6.9 11/24/2011 0530   ALBUMIN 3.1* 12/08/2011 0510   AST 18 11/24/2011 0530   ALT 9 11/24/2011 0530   ALKPHOS 46 11/24/2011 0530   BILITOT 0.4 11/24/2011 0530   GFRNONAA 12* 12/08/2011 0510   GFRAA 14* 12/08/2011 0510   CBG (last 3)   Basename 12/08/11 1143 12/08/11 0738 12/07/11 2118  GLUCAP 238* 117* 296*    Lab Results  Component Value Date   HGBA1C 6.7* 11/24/2011    Intake/Output Summary (Last 24 hours) at 12/08/11 1237 Last data filed at 12/08/11 0858  Gross per 24 hour  Intake    960 ml  Output   1050 ml  Net    -90 ml   Weight Status:  122.5 kg (s/p HD on 1/24), wt has increased 6.5 kg from post-HD on 1/21 (116 kg)  Re-estimated needs:  1800 - 2000 kcal, 80 - 100 grams  Nutrition Dx:  Inadequate oral intake - resolved.  Goal:  Pt to consume >/= 75% of meals  and supplements.  Intervention:   1. Continue current interventions, pt eating well. 2. Will decrease Resource Breeze to PO BID, as intake has improved 3. RD to follow nutrition care plan  Monitor:  Weights, labs, renal function, I/O's  Asencion Partridge Pager #:  [phone]

## 2011-12-09 ENCOUNTER — Inpatient Hospital Stay (HOSPITAL_COMMUNITY): Payer: Medicare Other

## 2011-12-09 LAB — RENAL FUNCTION PANEL
Albumin: 2.7 g/dL — ABNORMAL LOW (ref 3.5–5.2)
GFR calc Af Amer: 12 mL/min — ABNORMAL LOW (ref >90)
Glucose, Bld: 188 mg/dL — ABNORMAL HIGH (ref 70–99)
Phosphorus: 5 mg/dL — ABNORMAL HIGH (ref 2.3–4.6)
Potassium: 3.5 mEq/L (ref 3.5–5.1)
Sodium: 139 mEq/L (ref 135–145)

## 2011-12-09 LAB — CBC
Platelets: 132 10*3/uL — ABNORMAL LOW (ref 150–400)
RDW: 14.7 % (ref 11.5–15.5)
WBC: 12.6 10*3/uL — ABNORMAL HIGH (ref 4.0–10.5)

## 2011-12-09 LAB — GLUCOSE, CAPILLARY
Glucose-Capillary: 167 mg/dL — ABNORMAL HIGH (ref 70–99)
Glucose-Capillary: 155 mg/dL — ABNORMAL HIGH (ref 70–99)
Glucose-Capillary: 122 mg/dL — ABNORMAL HIGH (ref 70–99)
Glucose-Capillary: 123 mg/dL — ABNORMAL HIGH (ref 70–99)

## 2011-12-09 MED ORDER — HEPARIN SODIUM (PORCINE) 1000 UNIT/ML DIALYSIS
20.0000 [IU]/kg | INTRAMUSCULAR | Status: DC | PRN
  Filled 2011-12-09: qty 3

## 2011-12-09 MED ORDER — ACD FORMULA A 0.73-2.45-2.2 GM/100ML VI SOLN
Status: AC
  Filled 2011-12-09: qty 500

## 2011-12-09 MED ORDER — AMLODIPINE BESYLATE 2.5 MG PO TABS
2.5000 mg | ORAL_TABLET | Freq: Every day | ORAL | Status: DC
  Administered 2011-12-09 – 2011-12-11 (×3): 2.5 mg via ORAL
  Filled 2011-12-09 (×5): qty 1

## 2011-12-09 MED ORDER — SODIUM CHLORIDE 0.9 % IV SOLN
2.0000 g | INTRAVENOUS | Status: DC | PRN
  Administered 2011-12-10: 2 g via INTRAVENOUS
  Filled 2011-12-09 (×2): qty 20

## 2011-12-09 MED ORDER — SODIUM CHLORIDE 0.9 % IV SOLN
INTRAVENOUS | Status: AC
  Filled 2011-12-09 (×6): qty 200

## 2011-12-09 NOTE — Progress Notes (Signed)
PT Cancellation Note  Treatment cancelled today due to patient receiving procedure or test and pt in hemodialysis.  Ryan Jimenez 12/09/2011, 3:03 PM [name] [name] DPT [phone]

## 2011-12-09 NOTE — Progress Notes (Signed)
Subjective: Interval History:  Hypertension to 160-170. Patient c/o weakness when ambulating, working with PT to his ability. Complains of right posterior ankle tightness.  No hemoptysis overnight. UO 1300 ml in past 24 hours.   S/p 3 HD treatments and now 3 PEX treatments-tolerated well. Maybe a little more confused today. Uremia, steroids?  Objective:  Vital signs in last 24 hours:  Temp:  [97.1 F (36.2 C)-98.7 F (37.1 C)] 97.1 F (36.2 C) (01/25 0500) Pulse Rate:  [61-70] 61  (01/25 0500) Resp:  [18-21] 18  (01/25 0500) BP: (137-171)/(55-74) 163/73 mmHg (01/25 0500) SpO2:  [90 %-99 %] 99 % (01/25 0500) Weight:  [270 lb 1 oz (122.5 kg)] 270 lb 1 oz (122.5 kg) (01/24 1100) Weight change: 14 lb 1.8 oz (6.4 kg)  Intake/Output: I/O last 3 completed shifts: In: 1200 [P.O.:1200] Out: 2175 [Urine:2175]   Intake/Output Summary (Last 24 hours) at 12/09/11 0745 Last data filed at 12/09/11 0643  Gross per 24 hour  Intake   1200 ml  Output   1300 ml  Net   -100 ml     EXAM: HEENT: NCAT, NAD. Alert and conversant. RS- Rt basilar rales. Nonlabored.  CV: RRR, no murmurs. ABD- Nontender, BS +.  EXT- Trace symmetric bilateral LE edema. Rt IJ tunneled catheter. Calves, ankles non tender to palpation. NEURO-a/o x3. No gross motor deficits.   Foley with urine appears yellow.  Lab Results:  Basename 12/08/11 0510 12/07/11 0505 12/06/11 0915  WBC 11.1* 11.1* 9.4  HGB 7.4* 7.7* 6.4*  HCT 21.9* 22.4* 18.7*  PLT 118* 115* 120*   BMET  Basename 12/08/11 0510 12/07/11 0505 12/06/11 0909  NA 141 142 138  K 3.8 4.6 4.1  CL 100 104 104  CO2 33* 28 24  GLUCOSE 170* 170* 204*  BUN 73* 56* 89*  CREATININE 4.40* 3.54* 4.75*  CALCIUM 8.5 8.2* 7.6*  PHOS 4.9* 4.3 6.2*   LFT  Basename 12/08/11 0510  PROT --  ALBUMIN 3.1*  AST --  ALT --  ALKPHOS --  BILITOT --  BILIDIR --  IBILI --   PT/INR No results found for this basename: LABPROT:2,INR:2 in the last 72 hours Hepatitis  Panel No results found for this basename: HEPBSAG,HCVAB,HEPAIGM,HEPBIGM in the last 72 hours PTH: Lab Results  Component Value Date   CALCIUM 8.5 12/08/2011   PHOS 4.9* 12/08/2011  Albumin: 2.7  Anemia lab work-up: Iron: 29 Ferritin: 154 Sat: 14 Folate 7.4  B12 456  Renal lab work-up: UNa:  81 [place]: 35 UCl: 36 UCr: 50.35 UPr: 1 UA 1/10:  large RBC's, negative protein, ketones glucose, bilirubin, LE and nirtrites. Micro exam: TNTC RBC's per HPF. UA 1/11: 100 protein, 15 ketones, small bilirubin, negative blood.  C3 129 C4 31 ASO 64 Ratio UPr/Cr: 0.23 24 hr urine protein: 360 mg 24 hr urine creatinine: 1511 SPEP/UPEP- kappa 19.9, lambda 3.59, ratio 5.56, immunofixation pending  ANA- positive, 1:1280 Anti-ds DNA-36 (equivocal) Anti-myeloperoxidate positive (see paper records) c-ANCA negative p-ANCA positive (paper chart) Anti-GBM negative HIV-negative   Studies/Results: 2D-ECHO: EF 55-60%, normal wall motion     . therapeutic plasma exchange solution   Dialysis Once in dialysis  . calcium carbonate  2 tablet Oral Q3H  . calcium gluconate  2 g Intravenous Once  . carvedilol  6.25 mg Oral BID WC  . cyclophosphamide  200 mg Oral Daily  . darbepoetin (ARANESP) injection - DIALYSIS  60 mcg Intravenous Q Mon-HD  . diphenhydrAMINE  25 mg Oral Once  .  dutasteride  0.5 mg Oral Daily  . feeding supplement  1 Container Oral BID  . heparin  1,000 Units Intracatheter Once  . heparin subcutaneous  5,000 Units Subcutaneous Q8H  . hydrALAZINE  100 mg Oral BID  . insulin aspart  0-9 Units Subcutaneous TID AC & HS  . insulin aspart  4 Units Subcutaneous TID WC  . insulin glargine  25 Units Subcutaneous QHS  . ketotifen  1 drop Both Eyes BID  . predniSONE  60 mg Oral Q breakfast  . sevelamer  800 mg Oral TID WC  . Vitamin D (Ergocalciferol)  50,000 Units Oral Q7 days  . DISCONTD: acetaminophen  650 mg Oral Once  . DISCONTD: feeding supplement  1 Container Oral TID BM  .  DISCONTD: heparin  1,000 Units Intracatheter Once   Biopsy shows necrotizing GN with crescents, all glomeruli involved.   Assessment/Plan: 77 yo M with uncontrolled hypertension presents with progressive renal decline, +MPO Ab, +p-ANCA and hemoptysis, concerning for microscopic polyangiitis.   1. Acute kidney disease: Baseline Cr is 0.9-1.2 from Sept 2012.   Positive labs include high titer ANA, +p-ANCA and anti-MPO Ab. 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  Likely intermittent hemoptysis related to vasculitic process. Started on solumedrol empirically 11/30/11 transitioned to prednisone 1/19. Cytoxan (1/18) and PEX (1/18) every other day for 2 weeks. Has tolerated treatment to date. Continue steroids/cytoxan/PP for another 4 treatments. HD as needed, UOP has improved. Patient has permanent RIJ catheter. Plan for PEX again today with albumin. No labs to assess renal function as of yet.  If worse will most likely need HD Saturday.  Having good UOP so still hopeful that we will see desired response, will need more time to tell. 2. ANEMIA-iron deficiency anemia and ABL anemia from gross hematuria and recent kidney biopsy. Patient received feraheme on 11/26/11. Hgb trending down to 7.1 after biopsy and hematuria, good response with 2 units given in HD now twice. Slowly downtrending again but gross hematuria resolved. F/u daily CBC to assess for blood loss with hematuria/hemoptysis. Aranesp weekly.  DDAVP x1 1/22. Consider pRBC transfusion today for goal Hgb >7.5.  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  4. HTN/VOL-BP somewhat elevated off home antihypertensive norvasc. Medication held for soft BP after last HD treatment. Hold for SBP <120 on coreg, hydralazine, terazosin. Goal SBP 140-130. Has received all meds ON, consider restart norvasc  Yes depending on HD needs and volume status. 5. Dizziness: Improvement per pt report.    6. DM. Decent control on Lantus increased to 25units + Novolog 4 units wc + SSI. Increased needs likely due to prednisone. Nutrition following. Resource supplement BID. Diabetic team rec increasing meal coverage novolog to 6 units.  7. Thrombocytopenia. Plt slowly downtrending since admission. Highest in 2 years has been 160s. Stable overnight at 118. Possibly due to cytotoxic medication. Will avoid heparin in setting of acute blood loss. Monitor daily. 8. PPX. No anticoagulation due to active bleeding. SCDs are off patient though ordered previously. Consider LE doppler and restart SCDs. Incentive spirometry to bedside. Heparin SQ started 1/24. If develops asymmetric edema or pain will consider LE doppler. 9. Dispo. PT/OT following. Patient from [place] but planning to stay in area during recovery period. Will likely remain hospitalized throughout treatment course, but may need rehab with weakness and debilitation from extended stay in this elderly patient.   LOS: [facility] PGY-2 12/09/2011 7:45 AM  Patient  seen and examined, agree with above note with above modifications.  Corliss Parish, MD 12/09/2011

## 2011-12-09 NOTE — Procedures (Signed)
Patient was seen on TPE and the procedure was supervised.  BFR n/a  Via PC BP is  150/65.   Patient appears to be tolerating treatment well  Ryan Jimenez A 12/09/2011

## 2011-12-10 ENCOUNTER — Inpatient Hospital Stay (HOSPITAL_COMMUNITY): Payer: Medicare Other

## 2011-12-10 LAB — CBC
Hemoglobin: 8 g/dL — ABNORMAL LOW (ref 13.0–17.0)
Hemoglobin: 7.9 g/dL — ABNORMAL LOW (ref 13.0–17.0)
MCH: 27.7 pg (ref 26.0–34.0)
MCHC: 34.1 g/dL (ref 30.0–36.0)
MCV: 81.4 fL (ref 78.0–100.0)
RBC: 2.9 MIL/uL — ABNORMAL LOW (ref 4.22–5.81)
RBC: 2.85 MIL/uL — ABNORMAL LOW (ref 4.22–5.81)

## 2011-12-10 LAB — RENAL FUNCTION PANEL
BUN: 76 mg/dL — ABNORMAL HIGH (ref 6–23)
CO2: 28 mEq/L (ref 19–32)
CO2: 27 mEq/L (ref 19–32)
Calcium: 8.5 mg/dL (ref 8.4–10.5)
Chloride: 106 mEq/L (ref 96–112)
Creatinine, Ser: 4.87 mg/dL — ABNORMAL HIGH (ref 0.50–1.35)
GFR calc Af Amer: 12 mL/min — ABNORMAL LOW (ref >90)
GFR calc Af Amer: 12 mL/min — ABNORMAL LOW (ref >90)
GFR calc non Af Amer: 10 mL/min — ABNORMAL LOW (ref >90)
Glucose, Bld: 139 mg/dL — ABNORMAL HIGH (ref 70–99)
Sodium: 144 mEq/L (ref 135–145)

## 2011-12-10 LAB — GLUCOSE, CAPILLARY: Glucose-Capillary: 128 mg/dL — ABNORMAL HIGH (ref 70–99)

## 2011-12-10 LAB — PREPARE RBC (CROSSMATCH)

## 2011-12-10 MED ORDER — CALCIUM GLUCONATE 10 % IV SOLN: 2.0000 g | INTRAVENOUS | Status: DC | PRN

## 2011-12-10 MED ORDER — ACETAMINOPHEN 325 MG PO TABS: 650.0000 mg | ORAL_TABLET | ORAL | Status: DC | PRN

## 2011-12-10 MED ORDER — ACD FORMULA A 0.73-2.45-2.2 GM/100ML VI SOLN
Status: AC
  Filled 2011-12-10: qty 500

## 2011-12-10 MED ORDER — SODIUM CHLORIDE 0.9 % IV SOLN
INTRAVENOUS | Status: AC
  Administered 2011-12-10 (×5): via INTRAVENOUS_CENTRAL
  Filled 2011-12-10 (×5): qty 200

## 2011-12-10 MED ORDER — HEPARIN SODIUM (PORCINE) 1000 UNIT/ML IJ SOLN: 1000.0000 [IU] | Freq: Once | INTRAMUSCULAR | Status: DC

## 2011-12-10 MED ORDER — CALCIUM GLUCONATE 10 % IV SOLN: 2.0000 g | Freq: Once | INTRAVENOUS | Status: DC

## 2011-12-10 MED ORDER — SODIUM CHLORIDE 0.9 % IV SOLN: 2.0000 g | INTRAVENOUS | Status: DC | PRN

## 2011-12-10 MED ORDER — CALCIUM CARBONATE ANTACID 500 MG PO CHEW: 2.0000 | CHEWABLE_TABLET | ORAL | Status: DC | PRN

## 2011-12-10 MED ORDER — SODIUM CHLORIDE 0.9 % IV SOLN: 4.0000 g | INTRAVENOUS | Status: DC | PRN

## 2011-12-10 MED ORDER — DIPHENHYDRAMINE HCL 25 MG PO CAPS: 25.0000 mg | ORAL_CAPSULE | Freq: Four times a day (QID) | ORAL | Status: DC | PRN

## 2011-12-10 MED ORDER — SODIUM CHLORIDE 0.9 % IV SOLN
INTRAVENOUS | Status: AC
  Filled 2011-12-10 (×5): qty 200

## 2011-12-10 MED ORDER — ACD FORMULA A 0.73-2.45-2.2 GM/100ML VI SOLN: 500.0000 mL | Status: DC

## 2011-12-10 NOTE — Progress Notes (Signed)
Subjective: Interval History: S/p 3 HD treatments and now 4 PEX treatments-tolerated well. Will not plasmapheresis tomorrow   Objective: Vital signs in last 24 hours:  Temp:  [97.8 F (36.6 C)-99.4 F (37.4 C)] 98.7 F (37.1 C) (01/26 0959) Pulse Rate:  [57-67] 65  (01/26 0959) Resp:  [17-20] 19  (01/26 0959) BP: (147-181)/(55-82) 181/81 mmHg (01/26 0959) SpO2:  [96 %-98 %] 96 % (01/26 0959)  Weight change:   Intake/Output: I/O last 3 completed shifts: In: 360 [P.O.:360] Out: 1775 [Urine:1775]   Intake/Output this shift:  Total I/O In: 240 [P.O.:240] Out: 600 [Urine:600]  HEENT: NCAT, NAD. Alert and conversant.  RS- clear this morning CV: RRR, no murmurs.  ABD- Nontender, BS +.  EXT- no edema NEURO-a/o x3. No gross motor deficits.    Lab Results:  Basename 12/10/11 0500 12/09/11 1030 12/08/11 0510  WBC 8.9 12.6* 11.1*  HGB 8.0* 7.6* 7.4*  HCT 23.5* 21.9* 21.9*  PLT 125* 132* 118*   BMET  Basename 12/10/11 0625 12/09/11 1030 12/08/11 0510  NA 144 139 141  K 4.2 3.5 3.8  CL 106 100 100  CO2 28 29 33*  GLUCOSE 54* 188* 170*  BUN 80* 83* 73*  CREATININE 4.91* 4.94* 4.40*  CALCIUM 8.4 8.5 8.5  PHOS 5.2* 5.0* 4.9*   LFT  Basename 12/10/11 0625  PROT --  ALBUMIN 3.7  AST --  ALT --  ALKPHOS --  BILITOT --  BILIDIR --  IBILI --   PT/INR No results found for this basename: LABPROT:2,INR:2 in the last 72 hours Hepatitis Panel No results found for this basename: HEPBSAG,HCVAB,HEPAIGM,HEPBIGM in the last 72 hours  Studies/Results: No results found.  I have reviewed the patient's current medications.  Assessment/Plan: 78 yo M with uncontrolled hypertension presents with progressive renal decline, +MPO Ab, +p-ANCA and hemoptysis, concerning for microscopic polyangiitis.  1. Acute kidney disease: Baseline Cr is 0.9-1.2 from Sept 2012. Confirmed Vasculitis. solumedrol empirically 11/30/11 transitioned to prednisone 1/19. Cytoxan (1/18) and PEX (1/18)  every other day for 2 weeks. Has tolerated treatment to date. Continue steroids/cytoxan.PP for another 3 treatments. HD as needed, UOP has improved. Patient has permanent RIJ catheter. Plan for PEX again today with albumin.No dialysis today 2. ANEMIA-s/p 2 units PRBC 3. MBD. stable 4. HTN/VOL-a little elevated will follow 5. DM. Decent control on Lantus increased to 25units + Novolog 4 units wc + SSI. Increased needs likely due to prednisone. Nutrition following. Resource supplement BID. Diabetic team rec increasing meal coverage novolog to 6 units.  6. Thrombocytopenia. Plt slowly downtrending since admission. Highest in 2 years has been 160s. Stable overnight at 118. Most likely secondary to PPX 7. PPX. Number 4/7 8. Dispo. PT/OT following. Patient from [place] but planning to stay in area during recovery period. Will likely remain hospitalized throughout treatment course, but may need rehab with weakness and debilitation from extended stay in this elderly patient.    LOS: 55 Fahd Galea W @TODAY @10 :51 AM

## 2011-12-10 NOTE — Progress Notes (Addendum)
PT Cancellation Note  Treatment cancelled today due to patient receiving procedure or test.  Attempted to see patient x 2 today - patient receiving plasmapheresis and now starting HD.  Will return in am to attempt PT treatment.  Despina Pole [id number] 12/10/2011, 5:31 PM

## 2011-12-11 LAB — CBC
HCT: 22.2 % — ABNORMAL LOW (ref 39.0–52.0)
Hemoglobin: 7.4 g/dL — ABNORMAL LOW (ref 13.0–17.0)
MCH: 27.3 pg (ref 26.0–34.0)
MCHC: 33.3 g/dL (ref 30.0–36.0)
MCV: 81.9 fL (ref 78.0–100.0)

## 2011-12-11 LAB — GLUCOSE, CAPILLARY
Glucose-Capillary: 100 mg/dL — ABNORMAL HIGH (ref 70–99)
Glucose-Capillary: 258 mg/dL — ABNORMAL HIGH (ref 70–99)
Glucose-Capillary: 199 mg/dL — ABNORMAL HIGH (ref 70–99)

## 2011-12-11 LAB — RENAL FUNCTION PANEL
CO2: 29 mEq/L (ref 19–32)
Calcium: 8.6 mg/dL (ref 8.4–10.5)
GFR calc Af Amer: 19 mL/min — ABNORMAL LOW (ref >90)
GFR calc non Af Amer: 17 mL/min — ABNORMAL LOW (ref >90)
Sodium: 144 mEq/L (ref 135–145)

## 2011-12-11 MED ORDER — SODIUM CHLORIDE 0.9 % IV SOLN
2.0000 g | INTRAVENOUS | Status: DC | PRN
  Administered 2011-12-12 (×2): 2 g via INTRAVENOUS
  Filled 2011-12-11 (×3): qty 20

## 2011-12-11 MED ORDER — HEPARIN SODIUM (PORCINE) 1000 UNIT/ML IJ SOLN
1000.0000 [IU] | Freq: Once | INTRAMUSCULAR | Status: DC
  Filled 2011-12-11: qty 1

## 2011-12-11 MED ORDER — DIPHENHYDRAMINE HCL 25 MG PO CAPS: 25.0000 mg | ORAL_CAPSULE | Freq: Four times a day (QID) | ORAL | Status: DC | PRN

## 2011-12-11 MED ORDER — SODIUM CHLORIDE 0.9 % IV SOLN
INTRAVENOUS | Status: AC
  Filled 2011-12-11 (×5): qty 200

## 2011-12-11 MED ORDER — CALCIUM CARBONATE ANTACID 500 MG PO CHEW
2.0000 | CHEWABLE_TABLET | ORAL | Status: AC
  Administered 2011-12-12: 400 mg via ORAL
  Filled 2011-12-11 (×2): qty 2

## 2011-12-11 MED ORDER — SODIUM CHLORIDE 0.9 % IV SOLN
Freq: Once | INTRAVENOUS | Status: DC
  Filled 2011-12-11: qty 200

## 2011-12-11 MED ORDER — CALCIUM GLUCONATE 10 % IV SOLN
2.0000 g | INTRAVENOUS | Status: DC | PRN
  Filled 2011-12-11: qty 20

## 2011-12-11 MED ORDER — SODIUM CHLORIDE 0.9 % IV SOLN
4.0000 g | INTRAVENOUS | Status: DC | PRN
  Filled 2011-12-11: qty 40

## 2011-12-11 MED ORDER — ACETAMINOPHEN 325 MG PO TABS: 650.0000 mg | ORAL_TABLET | ORAL | Status: DC | PRN

## 2011-12-11 MED ORDER — ACD FORMULA A 0.73-2.45-2.2 GM/100ML VI SOLN
500.0000 mL | Status: DC
  Administered 2011-12-12 – 2011-12-14 (×3): 500 mL via INTRAVENOUS
  Filled 2011-12-11 (×3): qty 500

## 2011-12-11 MED ORDER — CALCIUM CARBONATE ANTACID 500 MG PO CHEW
2.0000 | CHEWABLE_TABLET | ORAL | Status: DC | PRN
  Administered 2011-12-12 – 2011-12-14 (×2): 400 mg via ORAL
  Administered 2011-12-16 (×2): 1000 mg via ORAL
  Filled 2011-12-11: qty 2

## 2011-12-11 NOTE — Progress Notes (Signed)
Subjective: Interval History: none. Urine output improving and creatinine decreasing  Objective: Vital signs in last 24 hours:  Temp:  [97.4 F (36.3 C)-98.8 F (37.1 C)] 98.8 F (37.1 C) (01/27 0450) Pulse Rate:  [60-74] 69  (01/27 0450) Resp:  [16-26] 20  (01/27 0450) BP: (143-181)/(56-83) 143/75 mmHg (01/27 0450) SpO2:  [94 %-100 %] 94 % (01/27 0450) Weight:  [118 kg (260 lb 2.3 oz)-121.3 kg (267 lb 6.7 oz)] 118 kg (260 lb 2.3 oz) (01/26 2132)  Weight change:   Intake/Output: I/O last 3 completed shifts: In: 69 [P.O.:480] Out: [id number] [Urine:2200; Other:3037]   Intake/Output this shift:     HEENT: NCAT, NAD. Alert and conversant.  RS- clear this morning  CV: RRR, no murmurs.  ABD- Nontender, BS +.  EXT- no edema  NEURO-a/o x3. No gross motor deficits.    Lab Results:  Basename 12/11/11 0637 12/10/11 1506 12/10/11 0500  WBC 6.8 8.4 8.9  HGB 7.4* 7.9* 8.0*  HCT 22.2* 23.2* 23.5*  PLT 102* 141* 125*   BMET  Basename 12/11/11 0637 12/10/11 1506 12/10/11 0625  NA 144 136 144  K 4.0 4.6 4.2  CL 105 100 106  CO2 29 27 28   GLUCOSE 116* 139* 54*  BUN 40* 76* 80*  CREATININE 3.31* 4.87* 4.91*  CALCIUM 8.6 8.5 8.4  PHOS 4.2 5.2* 5.2*   LFT  Basename 12/11/11 0637  PROT --  ALBUMIN 4.1  AST --  ALT --  ALKPHOS --  BILITOT --  BILIDIR --  IBILI --   PT/INR No results found for this basename: LABPROT:2,INR:2 in the last 72 hours Hepatitis Panel No results found for this basename: HEPBSAG,HCVAB,HEPAIGM,HEPBIGM in the last 72 hours  Studies/Results: No results found.  I have reviewed the patient's current medications.  Assessment/Plan: 78 yo M with uncontrolled hypertension presents with progressive renal decline, +MPO Ab, +p-ANCA and hemoptysis, concerning for microscopic polyangiitis.  1. Acute kidney disease: Baseline Cr is 0.9-1.2 from Sept 2012. Confirmed Vasculitis. solumedrol empirically 11/30/11 transitioned to prednisone 1/19. Cytoxan (1/18) and  PEX (1/18) every other day for 2 weeks. Has tolerated treatment to date. Continue steroids/cytoxan.PP for another 3 treatments. HD as needed, UOP has improved. Patient has permanent RIJ catheter. Plan for PEX again tomorrow with albumin.No dialysis today 2. ANEMIA-s/p 2 units PRBC 3. MBD. stable 4. HTN/VOL-a little elevated will follow 5. DM. Decent control on Lantus increased to 25units + Novolog 4 units wc + SSI. Increased needs likely due to prednisone. Nutrition following. Resource supplement BID. Diabetic team rec increasing meal coverage novolog to 6 units.  6. Thrombocytopenia. Plt slowly downtrending since admission. Highest in 2 years has been 160s. Stable overnight at 118. Most likely secondary to PPX 7. PPX. Number 4/7 8. Dispo. PT/OT following. Patient from [place] but planning to stay in area during recovery period. Will likely remain hospitalized throughout treatment course, but may need rehab with weakness and debilitation from extended stay in this 78 elderly patient.   Plan pheresis in AM    LOS: 18 Lucita Montoya W @TODAY @8 :55 AM

## 2011-12-11 NOTE — Progress Notes (Signed)
Physical Therapy Treatment Patient Details Name: Ryan Jimenez MRN: [id number] DOB: 12/08/1931 Today's Date: 12/11/2011  PT Assessment/Plan  PT - Assessment/Plan Comments on Treatment Session: Improved mobility today with less dizziness.  Able to perform exercises correctly without cues. PT Plan: Discharge plan remains appropriate;Frequency remains appropriate PT Frequency: Min 2X/week Follow Up Recommendations: Skilled nursing facility Equipment Recommended: Defer to next venue PT Goals  Acute Rehab PT Goals PT Goal: Supine/Side to Sit - Progress: Progressing toward goal PT Goal: Sit to Supine/Side - Progress: Progressing toward goal PT Goal: Sit to Stand - Progress: Progressing toward goal PT Goal: Stand to Sit - Progress: Progressing toward goal PT Goal: Ambulate - Progress: Progressing toward goal Additional Goals PT Goal: Additional Goal #1 - Progress: Progressing toward goal  PT Treatment Precautions/Restrictions  Precautions Precautions: Fall Precaution Comments: dizziness - vestibular issue Required Braces or Orthoses: No Restrictions Weight Bearing Restrictions: No Mobility (including Balance) Bed Mobility Bed Mobility: Yes Rolling Right: With rail;5: Supervision Rolling Right Details (indicate cue type and reason): Verbal cues to use rail.   Discouraged patient from pulling up on PT Right Sidelying to Sit: 4: Min assist;With rails;HOB elevated (comment degrees) (HOB at 30 degrees) Right Sidelying to Sit Details (indicate cue type and reason): Cues for using UE's more effectively to assist with transition.  Assist to prevent fall posteriorly initially Sitting - Scoot to Edge of Bed: 5: Supervision;With rail Sit to Supine: 5: Supervision;With rail;HOB elevated (comment degrees) (HOB at 20 degrees) Sit to Supine - Details (indicate cue type and reason): Supervision for safety Transfers Transfers: Yes Sit to Stand: 5: Supervision;With upper extremity assist;From  bed Sit to Stand Details (indicate cue type and reason): Supervision for safety/balance with dizziness Stand to Sit: 5: Supervision;With upper extremity assist;To bed Stand to Sit Details: verbal cues for hand placement Ambulation/Gait Ambulation/Gait: Yes Ambulation/Gait Assistance: 4: Min assist Ambulation/Gait Assistance Details (indicate cue type and reason): Assist due to unsteady gait at times due to dizziness.  Instructed patient to focus on target during gait.  Also worked on using target during turns, turning head first, locating target, then turning RW and body.  This did help make turns more safe - less unsteady. Ambulation Distance (Feet): 140 Feet Assistive device: Rolling walker Gait Pattern: Step-through pattern;Trunk flexed Gait velocity: Slow gait speed    Exercise  Other Exercises Other Exercises: Observed patient performing saccade exercises.  Able to hit targets without over/under shooting. End of Session PT - End of Session Equipment Utilized During Treatment: Gait belt Activity Tolerance: Patient tolerated treatment well Patient left: in bed;with call bell in reach;with family/visitor present Nurse Communication: Mobility status for ambulation General Behavior During Session: [facility] for tasks performed Cognition: Impaired  Despina Pole [id number] 12/11/2011, 4:31 PM

## 2011-12-12 ENCOUNTER — Inpatient Hospital Stay (HOSPITAL_COMMUNITY): Payer: Medicare Other

## 2011-12-12 LAB — RENAL FUNCTION PANEL
CO2: 30 mEq/L (ref 19–32)
Calcium: 8.8 mg/dL (ref 8.4–10.5)
GFR calc Af Amer: 14 mL/min — ABNORMAL LOW (ref >90)
GFR calc non Af Amer: 12 mL/min — ABNORMAL LOW (ref >90)
Potassium: 4 mEq/L (ref 3.5–5.1)
Sodium: 142 mEq/L (ref 135–145)

## 2011-12-12 LAB — CBC
Hemoglobin: 6.5 g/dL — CL (ref 13.0–17.0)
MCHC: 32.7 g/dL (ref 30.0–36.0)
RDW: 15.9 % — ABNORMAL HIGH (ref 11.5–15.5)

## 2011-12-12 LAB — GLUCOSE, CAPILLARY: Glucose-Capillary: 87 mg/dL (ref 70–99)

## 2011-12-12 LAB — DIFFERENTIAL
Basophils Relative: 0 % (ref 0–1)
Eosinophils Absolute: 0.1 10*3/uL (ref 0.0–0.7)
Lymphs Abs: 1 10*3/uL (ref 0.7–4.0)
Neutrophils Relative %: 74 % (ref 43–77)

## 2011-12-12 MED ORDER — FUROSEMIDE 10 MG/ML IJ SOLN
80.0000 mg | Freq: Once | INTRAMUSCULAR | Status: AC
  Administered 2011-12-12: 80 mg via INTRAVENOUS
  Filled 2011-12-12: qty 8

## 2011-12-12 MED ORDER — ACD FORMULA A 0.73-2.45-2.2 GM/100ML VI SOLN
Status: AC
  Administered 2011-12-12: 500 mL via INTRAVENOUS
  Filled 2011-12-12: qty 500

## 2011-12-12 MED ORDER — AMLODIPINE BESYLATE 5 MG PO TABS
5.0000 mg | ORAL_TABLET | Freq: Every day | ORAL | Status: DC
  Administered 2011-12-12: 5 mg via ORAL
  Filled 2011-12-12 (×2): qty 1

## 2011-12-12 MED ORDER — CALCIUM CARBONATE ANTACID 500 MG PO CHEW
CHEWABLE_TABLET | ORAL | Status: AC
  Administered 2011-12-12: 400 mg via ORAL
  Filled 2011-12-12: qty 2

## 2011-12-12 MED ORDER — FUROSEMIDE 10 MG/ML IJ SOLN
80.0000 mg | Freq: Two times a day (BID) | INTRAMUSCULAR | Status: AC
  Administered 2011-12-12: 80 mg via INTRAVENOUS
  Filled 2011-12-12 (×2): qty 8

## 2011-12-12 MED ORDER — SODIUM CHLORIDE 0.9 % IV SOLN
INTRAVENOUS | Status: AC
  Administered 2011-12-12 – 2011-12-13 (×4): via INTRAVENOUS_CENTRAL
  Filled 2011-12-12 (×5): qty 200

## 2011-12-12 MED ORDER — FUROSEMIDE 80 MG PO TABS
80.0000 mg | ORAL_TABLET | Freq: Once | ORAL | Status: AC
  Administered 2011-12-12: 80 mg via ORAL
  Filled 2011-12-12: qty 1

## 2011-12-12 MED ORDER — SODIUM CHLORIDE 0.9 % IJ SOLN
10.0000 mL | INTRAMUSCULAR | Status: DC | PRN
  Administered 2011-12-12: 10 mL

## 2011-12-12 MED ORDER — SODIUM CHLORIDE 0.9 % IJ SOLN
10.0000 mL | Freq: Two times a day (BID) | INTRAMUSCULAR | Status: DC
  Administered 2011-12-12: 20 mL
  Administered 2011-12-14 – 2011-12-17 (×2): 10 mL

## 2011-12-12 NOTE — Progress Notes (Signed)
CRITICAL VALUE ALERT  Critical value received:hgb 6.5 Date of notification:  12/12/11  Time of notification:  0700  Critical value read back:yes  Nurse who received alert:  Raymon Mutton rn  MD notified (1st page):  webb  Time of first page:  0730  MD notified (2nd page):  Time of second page:  Responding MD:  webb  Time MD responded:  [phone]

## 2011-12-12 NOTE — Progress Notes (Signed)
Physical Therapy Treatment Patient Details Name: Ryan Jimenez MRN: [id number] DOB: 14-Jan-1932 Today's Date: 12/12/2011  PT Assessment/Plan  PT - Assessment/Plan Comments on Treatment Session: Pt's mobility contiues to improve but pt continues to have difficulty wiith change in direction.  Pt fatigues quickly.  Ambulated 160 with RW and unable to continue due to fatigue.   NO C/O DIZZINESS throughout session.  Will increase PT frequencey to 3 times per week. If pt continues to improve discharge home may be appropriate.    PT Plan: Discharge plan remains appropriate;Frequency needs to be updated PT Frequency: Min 3X/week Follow Up Recommendations: Skilled nursing facility;Supervision for mobility/OOB Equipment Recommended: Defer to next venue PT Goals  Acute Rehab PT Goals PT Goal Formulation: With patient Time For Goal Achievement: 7 days Pt will go Supine/Side to Sit: Independently PT Goal: Supine/Side to Sit - Progress: Not met Pt will Sit at [facility] of Bed: Independently;6-10 min;with no upper extremity support PT Goal: Sit at Edge Of Bed - Progress: Not met Pt will go Sit to Supine/Side: Independently;with HOB 0 degrees PT Goal: Sit to Supine/Side - Progress: Not met Pt will go Sit to Stand: Independently;with upper extremity assist;without upper extremity assist PT Goal: Sit to Stand - Progress: Progressing toward goal Pt will go Stand to Sit: Independently;without upper extremity assist;with upper extremity assist PT Goal: Stand to Sit - Progress: Progressing toward goal Pt will Stand: 3 - 5 min;with no upper extremity support PT Goal: Stand - Progress: Not met Pt will Ambulate: >150 feet;Independently PT Goal: Ambulate - Progress: Progressing toward goal Pt will Perform Home Exercise Program: Independently PT Goal: Perform Home Exercise Program - Progress: Progressing toward goal Additional Goals Additional Goal #1: Demonstrate Vestibular exercises with out cuing and verbalize  performing exercises as suggested throughout the  day.  PT Goal: Additional Goal #1 - Progress: Progressing toward goal  PT Treatment Precautions/Restrictions  Precautions Precautions: Fall Precaution Comments: dizziness - vestibular issue Required Braces or Orthoses: No Restrictions Weight Bearing Restrictions: No Mobility (including Balance) Bed Mobility Bed Mobility: No Transfers Transfers: Yes Sit to Stand: 5: Supervision;From chair/3-in-1;With armrests;With upper extremity assist Sit to Stand Details (indicate cue type and reason): verbal cues for hand placement Stand to Sit: 5: Supervision;With armrests;With upper extremity assist;To chair/3-in-1 Stand to Sit Details: Verbal cues for safe disancing from chair prior to sitting.  Ambulation/Gait Ambulation/Gait: Yes Ambulation/Gait Assistance: 5: Supervision Ambulation/Gait Assistance Details (indicate cue type and reason): Supervision for safety.  Pt had difficulty with balance when changing direction but able to recover independently.   Ambulation Distance (Feet): 160 Feet Assistive device: Rolling walker Gait Pattern: Step-through pattern;Trunk flexed Gait velocity: Slow gait speed Stairs: No Wheelchair Mobility Wheelchair Mobility: No  Posture/Postural Control Posture/Postural Control: No significant limitations Balance Balance Assessed: No Exercise  Other Exercises Other Exercises: Observed patient performing saccade exercises.  Able to hit targets without over/under shooting. End of Session PT - End of Session Equipment Utilized During Treatment: Gait belt Activity Tolerance: Patient tolerated treatment well;Patient limited by fatigue Patient left: in chair;with call bell in reach Nurse Communication: Mobility status for ambulation General Behavior During Session: [facility] for tasks performed Cognition: Impaired  Ryan Jimenez 12/12/2011, 12:59 PM Tranika Scholler L. Mikale Silversmith DPT [phone]

## 2011-12-12 NOTE — Progress Notes (Signed)
I have seen and examined this patient and agree with the plan of care . Complicated situation but is now to receive his 5th treatment of plasmapheresis. The hope is that he has return of renal function and will avoid dialysis long term.  Ryan Jimenez 12/12/2011, 10:45 AM

## 2011-12-12 NOTE — Progress Notes (Signed)
Spoke to [employer] regarding  Pre- Med.  We were informed that Pre- Med will be rescheduled to start in AM.

## 2011-12-12 NOTE — Progress Notes (Signed)
Subjective: Interval History: Urine output 1300cc/24 hrs. Had HD on Saturday -3L, tolerated well. PEX planned for today. Patient feels ok, no complaints.  Objective: Vital signs in last 24 hours:  Temp:  [98.2 F (36.8 C)-98.8 F (37.1 C)] 98.3 F (36.8 C) (01/28 0504) Pulse Rate:  [59-73] 59  (01/28 0504) Resp:  [18-20] 19  (01/28 0504) BP: (146-161)/(64-76) 157/68 mmHg (01/28 0504) SpO2:  [97 %-99 %] 99 % (01/28 0504) Weight:  [257 lb 3.2 oz (116.665 kg)] 257 lb 3.2 oz (116.665 kg) (01/27 2145)  Weight change: -10 lb 3.5 oz (-4.635 kg)  Intake/Output: I/O last 3 completed shifts: In: 1180 [P.O.:1180] Out: [id number] [Urine:2450; Other:3037]   Intake/Output this shift:     HEENT: NCAT, NAD. Alert and conversant.  Pulm: nonlabored off O2. CTAB CV: RRR, II/VI sys LLSB.  ABD- Nontender, BS +.  EXT- Trace bilateral pedal edema  NEURO-a/o x3. No gross motor deficits.    Lab Results:  Basename 12/12/11 0600 12/11/11 0637 12/10/11 1506  WBC 6.8 6.8 8.4  HGB 6.5* 7.4* 7.9*  HCT 19.9* 22.2* 23.2*  PLT 102* 102* 141*   BMET  Basename 12/12/11 0600 12/11/11 0637 12/10/11 1506  NA 142 144 136  K 4.0 4.0 4.6  CL 104 105 100  CO2 30 29 27   GLUCOSE 107* 116* 139*  BUN 53* 40* 76*  CREATININE 4.19* 3.31* 4.87*  CALCIUM 8.8 8.6 8.5  PHOS 4.3 4.2 5.2*   LFT  Basename 12/12/11 0600  PROT --  ALBUMIN 3.5  AST --  ALT --  ALKPHOS --  BILITOT --  BILIDIR --  IBILI --   PT/INR No results found for this basename: LABPROT:2,INR:2 in the last 72 hours Hepatitis Panel No results found for this basename: HEPBSAG,HCVAB,HEPAIGM,HEPBIGM in the last 72 hours  Studies/Results: No results found.     . therapeutic plasma exchange solution   Dialysis Q1 Hr x 5  . amLODipine  2.5 mg Oral Daily  . calcium carbonate  2 tablet Oral Q3H  . carvedilol  6.25 mg Oral BID WC  . cyclophosphamide  200 mg Oral Daily  . darbepoetin (ARANESP) injection - DIALYSIS  60 mcg Intravenous Q  Mon-HD  . diphenhydrAMINE  25 mg Oral Once  . dutasteride  0.5 mg Oral Daily  . feeding supplement  1 Container Oral BID  . heparin  1,000 Units Intracatheter Once  . heparin  1,000 Units Intracatheter Once  . heparin  1,000 Units Intracatheter Once  . heparin subcutaneous  5,000 Units Subcutaneous Q8H  . hydrALAZINE  100 mg Oral BID  . insulin aspart  0-9 Units Subcutaneous TID AC & HS  . insulin aspart  4 Units Subcutaneous TID WC  . insulin glargine  25 Units Subcutaneous QHS  . ketotifen  1 drop Both Eyes BID  . predniSONE  60 mg Oral Q breakfast  . sevelamer  800 mg Oral TID WC  . Vitamin D (Ergocalciferol)  50,000 Units Oral Q7 days  . DISCONTD: therapeutic plasma exchange solution   Dialysis Once in dialysis  . DISCONTD: calcium gluconate  2 g Intravenous Once    Assessment/Plan: 78 yo M with uncontrolled hypertension presents with progressive renal decline, +MPO Ab, +p-ANCA and hemoptysis, concerning for microscopic polyangiitis.  1. Acute kidney disease: Baseline Cr is 0.9-1.2 from Sept 2012. Confirmed Vasculitis. solumedrol empirically 11/30/11 transitioned to prednisone 1/19. Cytoxan (1/18) and PEX (1/18) every other day for 2 weeks. Has tolerated treatment to date. Continue steroids/cytoxan.PP for  another 3 treatments. HD as needed, UOP has improved. Patient has permanent RIJ catheter. Plan for PEX again today with albumin. No acute dialysis needs today. 2. ANEMIA-Hgb has dropped to 6.5 overnight. Appears two units were typed but not transfused with HD on Saturday. May give this  3. MBD. stable 4. HTN/VOL-a little elevated, may titrate up norvasc as tolerated. 5. DM. Decent control on Lantus increased to 25units + Novolog 4 units wc + SSI. Increased needs likely due to prednisone. Nutrition following. Resource supplement BID.  6. Thrombocytopenia. Stable 100-110s-. Most likely secondary to PPX 7. PPX. Heparin 5000 u TID 8.  Dispo. PT/OT recommend SNF due to debilitation.  Patient from [place] but planning to stay in area during recovery period. Will remain hospitalized throughout treatment course, at least through this week.   LOS: [place] PGY-2 12/12/2011 7:44 AM

## 2011-12-12 NOTE — Progress Notes (Signed)
OT Cancellation Note  Treatment cancelled today due to Pt. just back to bed and fatigued.  Lucille Passy M 12/12/2011, 5:02 PM

## 2011-12-13 LAB — CBC
MCV: 83.5 fL (ref 78.0–100.0)
Platelets: 86 10*3/uL — ABNORMAL LOW (ref 150–400)
RBC: 3.09 MIL/uL — ABNORMAL LOW (ref 4.22–5.81)
WBC: 6.5 10*3/uL (ref 4.0–10.5)

## 2011-12-13 LAB — RENAL FUNCTION PANEL
Albumin: 4.1 g/dL (ref 3.5–5.2)
BUN: 51 mg/dL — ABNORMAL HIGH (ref 6–23)
CO2: 25 mEq/L (ref 19–32)
Chloride: 104 mEq/L (ref 96–112)
Glucose, Bld: 109 mg/dL — ABNORMAL HIGH (ref 70–99)
Potassium: 3.8 mEq/L (ref 3.5–5.1)

## 2011-12-13 LAB — GLUCOSE, CAPILLARY: Glucose-Capillary: 115 mg/dL — ABNORMAL HIGH (ref 70–99)

## 2011-12-13 LAB — TYPE AND SCREEN
ABO/RH(D): B POS
Unit division: 0

## 2011-12-13 MED ORDER — CALCIUM CARBONATE ANTACID 500 MG PO CHEW
2.0000 | CHEWABLE_TABLET | ORAL | Status: AC
  Administered 2011-12-14: 400 mg via ORAL
  Filled 2011-12-13 (×3): qty 2

## 2011-12-13 MED ORDER — ACETAMINOPHEN 325 MG PO TABS: 650.0000 mg | ORAL_TABLET | ORAL | Status: DC | PRN

## 2011-12-13 MED ORDER — SODIUM CHLORIDE 0.9 % IV SOLN
Freq: Once | INTRAVENOUS | Status: DC
  Filled 2011-12-13: qty 200

## 2011-12-13 MED ORDER — HEPARIN SODIUM (PORCINE) 1000 UNIT/ML IJ SOLN
1000.0000 [IU] | Freq: Once | INTRAMUSCULAR | Status: DC
  Filled 2011-12-13: qty 1

## 2011-12-13 MED ORDER — ACD FORMULA A 0.73-2.45-2.2 GM/100ML VI SOLN
Status: AC
  Administered 2011-12-13: 500 mL via INTRAVENOUS
  Filled 2011-12-13: qty 500

## 2011-12-13 MED ORDER — SODIUM CHLORIDE 0.9 % IV SOLN
Freq: Once | INTRAVENOUS | Status: AC
  Administered 2011-12-13: via INTRAVENOUS_CENTRAL
  Filled 2011-12-13: qty 200

## 2011-12-13 MED ORDER — SODIUM CHLORIDE 0.9 % IV SOLN: 2.0000 g | INTRAVENOUS | Status: DC | PRN

## 2011-12-13 MED ORDER — CALCIUM GLUCONATE 10 % IV SOLN: 2.0000 g | INTRAVENOUS | Status: DC | PRN

## 2011-12-13 MED ORDER — ACD FORMULA A 0.73-2.45-2.2 GM/100ML VI SOLN: 500.0000 mL | Status: DC

## 2011-12-13 MED ORDER — AMLODIPINE BESYLATE 10 MG PO TABS
10.0000 mg | ORAL_TABLET | Freq: Every day | ORAL | Status: DC
  Administered 2011-12-13 – 2011-12-17 (×5): 10 mg via ORAL
  Filled 2011-12-13 (×5): qty 1

## 2011-12-13 MED ORDER — CALCIUM CARBONATE ANTACID 500 MG PO CHEW: 2.0000 | CHEWABLE_TABLET | ORAL | Status: DC | PRN

## 2011-12-13 MED ORDER — SODIUM CHLORIDE 0.9 % IV SOLN: 4.0000 g | INTRAVENOUS | Status: DC | PRN

## 2011-12-13 MED ORDER — DIPHENHYDRAMINE HCL 25 MG PO CAPS: 25.0000 mg | ORAL_CAPSULE | Freq: Four times a day (QID) | ORAL | Status: DC | PRN

## 2011-12-13 MED FILL — Sodium Chloride IV Soln 0.9%: INTRAVENOUS | Qty: 4000 | Status: AC

## 2011-12-13 MED FILL — Albumin, Human Inj 25%: INTRAVENOUS | Qty: 2600 | Status: AC

## 2011-12-13 MED FILL — Albumin, Human Inj 25%: INTRAVENOUS | Qty: 1000 | Status: AC

## 2011-12-13 MED FILL — Sodium Chloride IV Soln 0.9%: INTRAVENOUS | Qty: 9000 | Status: AC

## 2011-12-13 NOTE — Progress Notes (Signed)
Pt's CBG 65 last night, gave carb snack, recheck CBG 101. Dudley Major RN

## 2011-12-13 NOTE — Progress Notes (Signed)
Occupational Therapy Treatment Patient Details Name: Ryan Jimenez MRN: [id number] DOB: 07/14/1932 Today's Date: 12/13/2011 10:50-11:09am 1 [place], 2 Indirect OT Assessment/Plan OT Assessment/Plan Comments on Treatment Session: Pt making slow gains toward OT plan of care & goals during ADL & selfcare tasks today for grooming and dressing, fatigues easily and benefits from VC's for safety and hand placement w/ stand to sit activities as he can be impulsive at times. OT Plan: Other (comment) (SNF rehab vs HHOT depending on pt progress) OT Frequency: Min 2X/week Follow Up Recommendations: Home health OT;Skilled nursing facility (depending on pt progress ) Equipment Recommended: Defer to next venue OT Goals ADL Goals ADL Goal: Grooming - Progress: Progressing toward goals ADL Goal: Upper Body Bathing - Progress: Progressing toward goals ADL Goal: Upper Body Dressing - Progress: Progressing toward goals  OT Treatment Precautions/Restrictions  Precautions Precautions: Fall Precaution Comments: dizziness - vestibular issues Required Braces or Orthoses: No Restrictions Weight Bearing Restrictions: No   ADL ADL Eating/Feeding: Not assessed Grooming: Performed;Wash/dry hands;Teeth care;Wash/dry face;Supervision/safety;Minimal assistance Grooming Details (indicate cue type and reason): Supervision-Min guard assist; pt w/o c/o vestibular issues during grooming while standing @ sink noted. Where Assessed - Grooming: Standing at sink Upper Body Bathing: Simulated;Set up Upper Body Bathing Details (indicate cue type and reason): Gather items Where Assessed - Upper Body Bathing: Unsupported;Sitting, chair Lower Body Bathing: Simulated;Minimal assistance Where Assessed - Lower Body Bathing: Sit to stand from chair;Sitting, chair;Supported Artist Dressing: Performed;Set up;Minimal assistance Upper Body Dressing Details (indicate cue type and reason): Set up to gather items (undershirt &  gown); Min guard assist to pull shirt down behind back Where Assessed - Upper Body Dressing: Sitting, chair;Unsupported Lower Body Dressing: Not assessed Toilet Transfer: Other (comment);Not assessed (Pt declines secondary does have to use toilet) Toilet Transfer Method: Not assessed Toileting - Clothing Manipulation: Not assessed Where Assessed - Toileting Clothing Manipulation: Not assessed Toileting - Hygiene: Not assessed Where Assessed - Toileting Hygiene: Not assessed Tub/Shower Transfer: Not assessed Equipment Used: Other (comment) (O2 via nasal cannula throughout session) Ambulation Related to ADLs: Min guard assist-supervision sit to stand & Supervision amb to & standing @ sink. ADL Comments: Pt making slow gains toward OT plan of care & goals during ADL & selfcare tasks today for grooming and dressing. Mobility  Bed Mobility Bed Mobility: No Transfers Transfers: Yes Sit to Stand: 5: Supervision;With armrests;From chair/3-in-1 Sit to Stand Details (indicate cue type and reason): Increased time Stand to Sit: 5: Supervision;To chair/3-in-1;With armrests;Other (comment) (VC's to reach for armrest as pt can be impulsive) Stand to Sit Details: VC for safety & hand placement w/ stand to sitting in chair   End of Session OT - End of Session Equipment Utilized During Treatment: Other (comment) (RW) Activity Tolerance: Patient limited by fatigue;Other (comment) (No c/o dizziness) Patient left: in chair;with call bell in reach General Behavior During Session: [facility] for tasks performed  Almyra Deforest  12/13/2011, 12:46 PM

## 2011-12-13 NOTE — Progress Notes (Signed)
I have seen and examined this patient and agree with the plan of care continue plex tomorrow total of 7 treatments. Renal function stable no hemodialysis and will follow.  Arlicia Paquette W 12/13/2011, 11:13 AM

## 2011-12-13 NOTE — Progress Notes (Signed)
Subjective: Interval History: Hypoglycemia to 65 overnight, corrected with snack. Urine output increased to 3925cc/24 hrs with lasix. Had Plasma Ex treatment #5 yesterday, tolerated well.  Patient feels ok, complains of poor appetite.  Objective: Vital signs in last 24 hours:  Temp:  [98.2 F (36.8 C)-99.8 F (37.7 C)] 99.6 F (37.6 C) (01/29 0500) Pulse Rate:  [59-83] 64  (01/29 0500) Resp:  [17-25] 18  (01/29 0500) BP: (138-179)/(57-99) 179/73 mmHg (01/29 0500) SpO2:  [94 %-100 %] 97 % (01/29 0500) Weight:  [256 lb 13.4 oz (116.5 kg)-259 lb 11.2 oz (117.8 kg)] 256 lb 13.4 oz (116.5 kg) (01/29 0125)  Weight change: 2 lb 8 oz (1.135 kg)  Intake/Output: I/O last 3 completed shifts: In: 960 [P.O.:960] Out: 4775 [Urine:4775]  Intake/Output Summary (Last 24 hours) at 12/13/11 0754 Last data filed at 12/13/11 0609  Gross per 24 hour  Intake    480 ml  Output   3925 ml  Net  -3445 ml      Intake/Output this shift:     HEENT: NCAT, NAD. Alert and conversant.  Pulm: nonlabored off O2. CTAB CV: RRR, II/VI sys LLSB.  ABD- Nontender, BS +.  EXT- Trace bilateral pedal edema. SCDs in place. Right chest tunneled catheter, bandage clean NEURO-a/o x3. No gross motor deficits.    Lab Results:  Basename 12/13/11 0625 12/12/11 0600 12/11/11 0637  WBC 6.5 6.8 6.8  HGB 8.6* 6.5* 7.4*  HCT 25.8* 19.9* 22.2*  PLT 86* 102* 102*   BMET  Basename 12/12/11 0600 12/11/11 0637 12/10/11 1506  NA 142 144 136  K 4.0 4.0 4.6  CL 104 105 100  CO2 30 29 27   GLUCOSE 107* 116* 139*  BUN 53* 40* 76*  CREATININE 4.19* 3.31* 4.87*  CALCIUM 8.8 8.6 8.5  PHOS 4.3 4.2 5.2*   LFT  Basename 12/12/11 0600  PROT --  ALBUMIN 3.5  AST --  ALT --  ALKPHOS --  BILITOT --  BILIDIR --  IBILI --   PT/INR No results found for this basename: LABPROT:2,INR:2 in the last 72 hours Hepatitis Panel No results found for this basename: HEPBSAG,HCVAB,HEPAIGM,HEPBIGM in the last 72  hours  Studies/Results: No results found.     . therapeutic plasma exchange solution   Dialysis Q1 Hr x 5  . therapeutic plasma exchange solution   Dialysis Once  . amLODipine  10 mg Oral Daily  . carvedilol  6.25 mg Oral BID WC  . cyclophosphamide  200 mg Oral Daily  . darbepoetin (ARANESP) injection - DIALYSIS  60 mcg Intravenous Q Mon-HD  . diphenhydrAMINE  25 mg Oral Once  . dutasteride  0.5 mg Oral Daily  . feeding supplement  1 Container Oral BID  . furosemide  80 mg Intravenous Q12H  . furosemide  80 mg Intravenous Once  . furosemide  80 mg Oral Once  . heparin  1,000 Units Intracatheter Once  . heparin  1,000 Units Intracatheter Once  . heparin  1,000 Units Intracatheter Once  . heparin subcutaneous  5,000 Units Subcutaneous Q8H  . hydrALAZINE  100 mg Oral BID  . insulin aspart  0-9 Units Subcutaneous TID AC & HS  . insulin aspart  4 Units Subcutaneous TID WC  . insulin glargine  25 Units Subcutaneous QHS  . ketotifen  1 drop Both Eyes BID  . predniSONE  60 mg Oral Q breakfast  . sevelamer  800 mg Oral TID WC  . sodium chloride  10-40 mL Intracatheter Q12H  .  Vitamin D (Ergocalciferol)  50,000 Units Oral Q7 days  . DISCONTD: amLODipine  2.5 mg Oral Daily  . DISCONTD: amLODipine  5 mg Oral Daily    Assessment/Plan: 78 yo M with uncontrolled hypertension presents with progressive renal decline, +MPO Ab, +p-ANCA and hemoptysis, concerning for microscopic polyangiitis.  1. Acute kidney disease: Baseline Cr is 0.9-1.2 from Sept 2012. Confirmed Vasculitis. solumedrol empirically 11/30/11 transitioned to prednisone 1/19. Cytoxan (1/18) and PEX (1/18) every other day for 2 weeks. Has tolerated treatment to date. Continue steroids/cytoxan. PP for another 2 treatments. HD as needed, UOP has improved. Patient has permanent RIJ catheter. Plan for PEX again tomorrow with albumin. F/u daily labs to assess for any acute dialysis needs. 2. ANEMIA-Hgb 6.5 improved to 8.6 with 2u  transfusion yesterday. 3. MBD. stable 4. HTN/VOL-a little elevated, will titrate up norvasc to 10mg  today.Continues on coreg, hydralazine, dutasteride at home doses. 5. DM. Decent control on Lantus increased to 25units + Novolog 4 units wc + SSI. Increased needs likely due to prednisone. Nutrition following. Resource supplement BID. Will f/u values today, may need to decrease lantus dose if recurrent hypoglycemia. 6. Thrombocytopenia. Downtrending slowly from 140s since therapy started. Will follow daily. 7. PPX. Heparin 5000 u TID. Consider DC if plts continue to fall.  8.  Dispo. PT/OT recommend SNF due to debilitation. Patient from [place] but planning to stay in area during recovery period. Will remain hospitalized throughout treatment course, at least through this week.   LOS: [place] PGY-2 12/13/2011 7:52 AM

## 2011-12-13 NOTE — Progress Notes (Signed)
CSW talked with patient and his wife to confirm skilled facility choice and it remains [facility]. The patient had several questions regarding his discharge (when medically stable) and his questions were answered.  CSW will continue to monitor patient progress and facilitate discharge to [facility] when medically stable.  Ariea Rochin Givens, MSW, LCSW [phone]

## 2011-12-14 ENCOUNTER — Inpatient Hospital Stay (HOSPITAL_COMMUNITY): Payer: Medicare Other

## 2011-12-14 LAB — GLUCOSE, CAPILLARY: Glucose-Capillary: 279 mg/dL — ABNORMAL HIGH (ref 70–99)

## 2011-12-14 LAB — CBC
HCT: 22.4 % — ABNORMAL LOW (ref 39.0–52.0)
Hemoglobin: 7.7 g/dL — ABNORMAL LOW (ref 13.0–17.0)
MCH: 28.3 pg (ref 26.0–34.0)
MCV: 82.4 fL (ref 78.0–100.0)
RBC: 2.72 MIL/uL — ABNORMAL LOW (ref 4.22–5.81)

## 2011-12-14 LAB — RENAL FUNCTION PANEL
BUN: 59 mg/dL — ABNORMAL HIGH (ref 6–23)
CO2: 26 mEq/L (ref 19–32)
Chloride: 104 mEq/L (ref 96–112)
Creatinine, Ser: 5.32 mg/dL — ABNORMAL HIGH (ref 0.50–1.35)
Glucose, Bld: 145 mg/dL — ABNORMAL HIGH (ref 70–99)

## 2011-12-14 MED ORDER — SODIUM CHLORIDE 0.9 % IV SOLN: 4.0000 g | INTRAVENOUS | Status: DC | PRN

## 2011-12-14 MED ORDER — CALCIUM CARBONATE ANTACID 500 MG PO CHEW: 2.0000 | CHEWABLE_TABLET | ORAL | Status: DC | PRN

## 2011-12-14 MED ORDER — ACETAMINOPHEN 325 MG PO TABS: 650.0000 mg | ORAL_TABLET | ORAL | Status: DC | PRN

## 2011-12-14 MED ORDER — CYCLOPHOSPHAMIDE 50 MG PO TABS
100.0000 mg | ORAL_TABLET | Freq: Every day | ORAL | Status: DC
  Administered 2011-12-14 – 2011-12-17 (×4): 100 mg via ORAL
  Filled 2011-12-14 (×4): qty 2

## 2011-12-14 MED ORDER — SODIUM CHLORIDE 0.9 % IV SOLN: 2.0000 g | INTRAVENOUS | Status: DC | PRN

## 2011-12-14 MED ORDER — SODIUM CHLORIDE 0.9 % IV SOLN
Freq: Once | INTRAVENOUS | Status: AC
  Administered 2011-12-14: 17:00:00 via INTRAVENOUS_CENTRAL
  Filled 2011-12-14: qty 200

## 2011-12-14 MED ORDER — ACD FORMULA A 0.73-2.45-2.2 GM/100ML VI SOLN: 500.0000 mL | Status: DC

## 2011-12-14 MED ORDER — HEPARIN SODIUM (PORCINE) 1000 UNIT/ML IJ SOLN
1000.0000 [IU] | Freq: Once | INTRAMUSCULAR | Status: DC
  Filled 2011-12-14: qty 1

## 2011-12-14 MED ORDER — CALCIUM GLUCONATE 10 % IV SOLN: 2.0000 g | INTRAVENOUS | Status: DC | PRN

## 2011-12-14 MED ORDER — ALBUMIN HUMAN 25 % IV SOLN
INTRAVENOUS | Status: AC
  Administered 2011-12-14 (×4): via INTRAVENOUS_CENTRAL
  Filled 2011-12-14 (×5): qty 200

## 2011-12-14 MED ORDER — ACD FORMULA A 0.73-2.45-2.2 GM/100ML VI SOLN
Status: AC
  Administered 2011-12-14: 500 mL via INTRAVENOUS
  Filled 2011-12-14: qty 500

## 2011-12-14 MED ORDER — DIPHENHYDRAMINE HCL 25 MG PO CAPS: 25.0000 mg | ORAL_CAPSULE | Freq: Four times a day (QID) | ORAL | Status: DC | PRN

## 2011-12-14 MED ORDER — CALCIUM CARBONATE ANTACID 500 MG PO CHEW
CHEWABLE_TABLET | ORAL | Status: AC
  Administered 2011-12-14: 400 mg via ORAL
  Filled 2011-12-14: qty 2

## 2011-12-14 MED ORDER — CALCIUM CARBONATE ANTACID 500 MG PO CHEW
2.0000 | CHEWABLE_TABLET | ORAL | Status: AC
  Filled 2011-12-14 (×2): qty 2

## 2011-12-14 NOTE — Progress Notes (Signed)
I have seen and examined this patient and agree with the plan of care will continue to perform pheresis and aggressive therapy of vasculitis. Decrease the cytoxan dose to 100mg  due to cytopenias. Klinton Candelas W 12/14/2011, 11:05 AM

## 2011-12-14 NOTE — Progress Notes (Signed)
Subjective: Interval History: UOP 1350 yesterday. PEX #6 planned for today.  Patient feels "good" today, hopeful his condition is improving. Eating breakfast today.  Objective: Vital signs in last 24 hours:  Temp:  [97.9 F (36.6 C)-98.9 F (37.2 C)] 97.9 F (36.6 C) (01/30 0600) Pulse Rate:  [57-72] 57  (01/30 0600) Resp:  [16-22] 16  (01/30 0600) BP: (128-154)/(58-75) 147/70 mmHg (01/30 0600) SpO2:  [95 %-100 %] 98 % (01/30 0600)  Weight change:   Intake/Output: I/O last 3 completed shifts: In: 480 [P.O.:480] Out: 4375 [Urine:4375]  Intake/Output Summary (Last 24 hours) at 12/14/11 0729 Last data filed at 12/13/11 2234  Gross per 24 hour  Intake    480 ml  Output   1350 ml  Net   -870 ml      Intake/Output this shift:     HEENT: NCAT, NAD. Alert and conversant.  Pulm: nonlabored. CTAB CV: RRR, II/VI sys LLSB.  ABD- Nontender, BS +.  EXT- Trace bilateral pedal edema. SCDs in place. Right chest tunneled catheter, bandage clean NEURO-a/o x3. No gross motor deficits.    Lab Results:  Basename 12/14/11 0426 12/13/11 0625 12/12/11 0600  WBC 5.8 6.5 6.8  HGB 7.7* 8.6* 6.5*  HCT 22.4* 25.8* 19.9*  PLT 70* 86* 102*   BMET  Basename 12/14/11 0426 12/13/11 0625 12/12/11 0600  NA 140 140 142  K 4.2 3.8 4.0  CL 104 104 104  CO2 26 25 30   GLUCOSE 145* 109* 107*  BUN 59* 51* 53*  CREATININE 5.32* 4.57* 4.19*  CALCIUM 8.5 8.6 8.8  PHOS 5.2* 5.0* 4.3   LFT  Basename 12/14/11 0426  PROT --  ALBUMIN 3.7  AST --  ALT --  ALKPHOS --  BILITOT --  BILIDIR --  IBILI --   PT/INR No results found for this basename: LABPROT:2,INR:2 in the last 72 hours Hepatitis Panel No results found for this basename: HEPBSAG,HCVAB,HEPAIGM,HEPBIGM in the last 72 hours  Studies/Results: No results found.     . therapeutic plasma exchange solution   Dialysis Q1 Hr x 5  . amLODipine  10 mg Oral Daily  . calcium carbonate  2 tablet Oral Q3H  . carvedilol  6.25 mg Oral BID  WC  . cyclophosphamide  200 mg Oral Daily  . darbepoetin (ARANESP) injection - DIALYSIS  60 mcg Intravenous Q Mon-HD  . diphenhydrAMINE  25 mg Oral Once  . dutasteride  0.5 mg Oral Daily  . feeding supplement  1 Container Oral BID  . furosemide  80 mg Intravenous Q12H  . heparin  1,000 Units Intracatheter Once  . heparin subcutaneous  5,000 Units Subcutaneous Q8H  . hydrALAZINE  100 mg Oral BID  . insulin aspart  0-9 Units Subcutaneous TID AC & HS  . insulin aspart  4 Units Subcutaneous TID WC  . insulin glargine  25 Units Subcutaneous QHS  . ketotifen  1 drop Both Eyes BID  . predniSONE  60 mg Oral Q breakfast  . sevelamer  800 mg Oral TID WC  . sodium chloride  10-40 mL Intracatheter Q12H  . Vitamin D (Ergocalciferol)  50,000 Units Oral Q7 days  . DISCONTD: therapeutic plasma exchange solution   Dialysis Once in dialysis  . DISCONTD: amLODipine  5 mg Oral Daily  . DISCONTD: heparin  1,000 Units Intracatheter Once  . DISCONTD: heparin  1,000 Units Intracatheter Once  . DISCONTD: heparin  1,000 Units Intracatheter Once    Assessment/Plan: 78 yo M with uncontrolled hypertension  presents with progressive renal decline, +MPO Ab, +p-ANCA and hemoptysis, concerning for microscopic polyangiitis.  1. Acute kidney disease: Baseline Cr is 0.9-1.2 from Sept 2012. Confirmed vasculitis on biopsy. Solumedrol empirically 11/30/11 transitioned to prednisone 1/19. Cytoxan (1/18) and PEX (1/18) every other day for 2 weeks. Has tolerated treatment to date. Continue steroids/cytoxan. PP for another 2 treatments. HD as needed, UOP has improved. Patient has permanent RIJ catheter. Plan for PEX again today with albumin as FFP is in short supply. Last HD treatment was 1/26, cr and BUN climbing, will likely require another treatment in near future.  2. ANEMIA-Hgb 6.5 improved to 8.6 with 2u transfusion 1/28. WIll trend, no gross bleeding and likely a result of iatrogenic pancytopenia.  3. MBD.  stable 4. HTN/VOL-improved with increased dose norvasc 10mg .Continues on coreg, hydralazine, dutasteride at home doses. 5. DM. Decent control on Lantus increased to 25units + Novolog 4 units wc + SSI. Increased needs likely due to prednisone. Nutrition following. Resource supplement BID.  6. Thrombocytopenia. Downtrending slowly from 140s since therapy started. Will follow daily. 7. PPX. Heparin 5000 u TID. Consider DC if plts continue to fall.  8.  Dispo. PT/OT recommend SNF due to debilitation, CM/SW following, tentative choice of [facility]. Patient from [place] but planning to stay in area during recovery period. Will remain hospitalized throughout treatment course, at least through this week.   LOS: [place] PGY-2 12/14/2011 7:29 AM

## 2011-12-14 NOTE — Progress Notes (Signed)
Nutrition Follow-up  Continues on HD as needed, last treatment was 1/26. Continues with pheresis. PO intake has declined per records, now consuming <50% of meals. Discussed PO intake with patient and he stated that it's improving. Also admits that he ate food provided by family yesterday so intake documented may not accurately reflect all that he has been eating. Stated he had 2 slices of pizza for dinner last night. Taking Breeze as scheduled.  Diet Order:  CHO Modified Medium Supplements: Resource Breeze PO BID  Meds: Scheduled Meds:   . therapeutic plasma exchange solution   Dialysis Q1 Hr x 5  . amLODipine  10 mg Oral Daily  . calcium carbonate  2 tablet Oral Q3H  . carvedilol  6.25 mg Oral BID WC  . cyclophosphamide  100 mg Oral Daily  . darbepoetin (ARANESP) injection - DIALYSIS  60 mcg Intravenous Q Mon-HD  . diphenhydrAMINE  25 mg Oral Once  . dutasteride  0.5 mg Oral Daily  . feeding supplement  1 Container Oral BID  . heparin  1,000 Units Intracatheter Once  . heparin subcutaneous  5,000 Units Subcutaneous Q8H  . hydrALAZINE  100 mg Oral BID  . insulin aspart  0-9 Units Subcutaneous TID AC & HS  . insulin aspart  4 Units Subcutaneous TID WC  . insulin glargine  25 Units Subcutaneous QHS  . ketotifen  1 drop Both Eyes BID  . predniSONE  60 mg Oral Q breakfast  . sevelamer  800 mg Oral TID WC  . sodium chloride  10-40 mL Intracatheter Q12H  . Vitamin D (Ergocalciferol)  50,000 Units Oral Q7 days  . DISCONTD: therapeutic plasma exchange solution   Dialysis Once in dialysis  . DISCONTD: cyclophosphamide  200 mg Oral Daily  . DISCONTD: heparin  1,000 Units Intracatheter Once  . DISCONTD: heparin  1,000 Units Intracatheter Once  . DISCONTD: heparin  1,000 Units Intracatheter Once   Continuous Infusions:   . citrate dextrose 500 mL (12/13/11 0023)  . DISCONTD: citrate dextrose     PRN Meds:.sodium chloride, sodium chloride, acetaminophen, acetaminophen, acetaminophen,  albuterol, alum & mag hydroxide-simeth, calcium carbonate, calcium gluconate IV, calcium gluconate IV, calcium gluconate, dextrose, diphenhydrAMINE, docusate sodium, feeding supplement (NEPRO CARB STEADY), fentaNYL, glucose-Vitamin C, guaiFENesin-dextromethorphan, heparin, heparin, lidocaine, lidocaine-prilocaine, meclizine, menthol-cetylpyridinium, midazolam ondansetron (ZOFRAN) IV, ondansetron, pentafluoroprop-tetrafluoroeth, sodium chloride, DISCONTD: acetaminophen, DISCONTD: calcium carbonate, DISCONTD: calcium gluconate IV, DISCONTD: calcium gluconate IV, DISCONTD: calcium gluconate, DISCONTD: diphenhydrAMINE  Labs:  CMP     Component Value Date/Time   NA 140 12/14/2011 0426   K 4.2 12/14/2011 0426   CL 104 12/14/2011 0426   CO2 26 12/14/2011 0426   GLUCOSE 145* 12/14/2011 0426   BUN 59* 12/14/2011 0426   CREATININE 5.32* 12/14/2011 0426   CALCIUM 8.5 12/14/2011 0426   PROT 6.9 11/24/2011 0530   ALBUMIN 3.7 12/14/2011 0426   AST 18 11/24/2011 0530   ALT 9 11/24/2011 0530   ALKPHOS 46 11/24/2011 0530   BILITOT 0.4 11/24/2011 0530   GFRNONAA 9* 12/14/2011 0426   GFRAA 11* 12/14/2011 0426  Phosphorus 5.2H   Intake/Output Summary (Last 24 hours) at 12/14/11 1207 Last data filed at 12/13/11 2234  Gross per 24 hour  Intake    120 ml  Output    825 ml  Net   -705 ml   Weight Status:  118.2 kg, s/p HD on 12/10/11, wt trending down 4.3 kg since HD on 1/24  Estimated needs:  1800 - 2000 kcal, 80 -  100 grams  Nutrition Dx:  Inadequate oral intake r/t fluctuating appetite AEB pt report.  Goal:  Pt to consume >/= 75% of meals and supplements.  Intervention:  Continue current interventions. Will address education needs closer to d/c.  Monitor:  Weights, labs, I/O's, renal function, PO intake  Asencion Partridge Pager #:  [phone]

## 2011-12-14 NOTE — Progress Notes (Signed)
Patient discussed at the Long Length of Stay Ryan Jimenez Ryan Jimenez 12/14/2011

## 2011-12-15 ENCOUNTER — Encounter (HOSPITAL_COMMUNITY): Payer: Self-pay

## 2011-12-15 LAB — CBC
HCT: 22.9 % — ABNORMAL LOW (ref 39.0–52.0)
HCT: 23.9 % — ABNORMAL LOW (ref 39.0–52.0)
Hemoglobin: 7.8 g/dL — ABNORMAL LOW (ref 13.0–17.0)
MCH: 28.7 pg (ref 26.0–34.0)
MCHC: 34.1 g/dL (ref 30.0–36.0)
MCHC: 34.3 g/dL (ref 30.0–36.0)
RBC: 2.72 MIL/uL — ABNORMAL LOW (ref 4.22–5.81)
RDW: 16.1 % — ABNORMAL HIGH (ref 11.5–15.5)

## 2011-12-15 LAB — RENAL FUNCTION PANEL
CO2: 21 mEq/L (ref 19–32)
Calcium: 8.2 mg/dL — ABNORMAL LOW (ref 8.4–10.5)
Chloride: 106 mEq/L (ref 96–112)
Creatinine, Ser: 5.17 mg/dL — ABNORMAL HIGH (ref 0.50–1.35)
GFR calc Af Amer: 11 mL/min — ABNORMAL LOW (ref >90)
Glucose, Bld: 181 mg/dL — ABNORMAL HIGH (ref 70–99)

## 2011-12-15 LAB — GLUCOSE, CAPILLARY
Glucose-Capillary: 154 mg/dL — ABNORMAL HIGH (ref 70–99)
Glucose-Capillary: 143 mg/dL — ABNORMAL HIGH (ref 70–99)
Glucose-Capillary: 225 mg/dL — ABNORMAL HIGH (ref 70–99)
Glucose-Capillary: 213 mg/dL — ABNORMAL HIGH (ref 70–99)

## 2011-12-15 NOTE — Progress Notes (Signed)
Critical lab value:Platlet count 29. Dr. Justin Mend on floor and notified. Lab reordered.

## 2011-12-15 NOTE — Progress Notes (Signed)
I have seen and examined this patient and agree with the plan of care improving renal function. Stop hepatrin and check HIT panel although I think that thrombocytopenia is due to pheresis. Last treatment tomprrow Ryan Jimenez W 12/15/2011, 10:15 AM

## 2011-12-15 NOTE — Progress Notes (Signed)
Occupational Therapy Treatment (goals updated) Patient Details Name: Ryan Jimenez MRN: [id number] DOB: 17-Jul-1932 Today's Date: 12/15/2011 10:41-11:06  OT Assessment/Plan OT Assessment/Plan Comments on Treatment Session: Pt still making gains since last session, goals udated this session. OT Plan: Discharge plan remains appropriate OT Frequency: Min 2X/week Follow Up Recommendations: Home health OT;Skilled nursing facility;Other (comment) (depending on progress and availablity of support at home) Equipment Recommended: Defer to next venue OT Goals Acute Rehab OT Goals OT Goal Formulation: With patient Time For Goal Achievement: 7 days ADL Goals Pt Will Perform Grooming: with set-up;with supervision;Unsupported;Sitting at sink;Other (comment) (2 tasks) ADL Goal: Grooming - Progress: Other (comment) (revised due to goal update due) Pt Will Perform Upper Body Bathing: with set-up;with supervision;Unsupported;Other (comment) (sit to stand at sink) ADL Goal: Upper Body Bathing - Progress:  (revised due to goal update due) Pt Will Perform Lower Body Bathing: with set-up;with supervision;Unsupported;Other (comment) (sit to stand at sink) ADL Goal: Lower Body Bathing - Progress: Other (comment) (revised due to goal update due) Pt Will Perform Upper Body Dressing: with set-up;with supervision;Unsupported;Sitting, bed;Sitting, chair ADL Goal: Upper Body Dressing - Progress: Other (comment) (revised due to goal update due) Pt Will Perform Lower Body Dressing: with set-up;with supervision;Unsupported;Sit to stand from chair;Sit to stand from bed ADL Goal: Lower Body Dressing - Progress: Other (comment) (revised due to goal update due) Pt Will Transfer to Toilet: with supervision;Ambulation;Comfort height toilet;Grab bars ADL Goal: Toilet Transfer - Progress: Other (comment) (revised due to goal update due) Pt Will Perform Toileting - Clothing Manipulation: Independently;Standing ADL Goal:  Toileting - Clothing Manipulation - Progress: Other (comment) (revised due to goal update due) Pt Will Perform Toileting - Hygiene: Independently;Sit to stand from 3-in-1/toilet ADL Goal: Toileting - Hygiene - Progress: Other (comment) (revised due to goal update due)  OT Treatment Precautions/Restrictions  Precautions Precautions: Fall Precaution Comments: I wouldn't call it dizzy, just blurry Required Braces or Orthoses: No Restrictions Weight Bearing Restrictions: No   ADL ADL Eating/Feeding: Not assessed Grooming: Not assessed Upper Body Bathing: Not assessed Lower Body Bathing: Simulated;Minimal assistance;Other (comment) (for feet and increased time due to DOE) Where Assessed - Lower Body Bathing: Unsupported;Sitting, bed (bending forward) Upper Body Dressing: Not assessed Lower Body Dressing: Performed;Moderate assistance;Other (comment) (for socks, could get them over toes, but not pulled up) Lower Body Dressing Details (indicate cue type and reason): This is the first time that the pt has tried this task since hospitalization. Where Assessed - Lower Body Dressing: Sitting, bed Toilet Transfer: Simulated;Other (comment) (min guard A) Toilet Transfer Details (indicate cue type and reason): bed out to ambulate in the hallway and back to bed Toilet Transfer Method: Ambulating Toileting - Hygiene: Not assessed Tub/Shower Transfer: Not assessed Ambulation Related to ADLs: Min guard A ADL Comments: Did educate pt on pursed lipped breathing Mobility  Bed Mobility Bed Mobility: Yes Supine to Sit: 6: Modified independent (Device/Increase time);With rails;HOB flat Sitting - Scoot to Edge of Bed: 6: Modified independent (Device/Increase time);With rail Sit to Supine: 6: Modified independent (Device/Increase time);With rail;HOB flat Transfers Transfers: Yes Sit to Stand: With upper extremity assist;From bed (min guard A) Stand to Sit: 5: Supervision;To bed;With upper extremity  assist Exercises    End of Session OT - End of Session Equipment Utilized During Treatment: Other (comment) (RW) Activity Tolerance: Other (comment) (limited by DOE and "blurriness") Patient left: in bed;with call bell in reach (phone and cell phone within reach) General Behavior During Session: [facility] for tasks performed Cognition: [facility] for  tasks performed  [name] [id number] 12/15/2011, 11:32 AM

## 2011-12-15 NOTE — Progress Notes (Signed)
Subjective: Interval History: UOP 1575 yesterday. Net negative 735cc/24 hours. PEX #6 without complication.  No complaints today. Ate half of breakfast. Objective: Vital signs in last 24 hours:  Temp:  [96.9 F (36.1 C)-98.4 F (36.9 C)] 98.2 F (36.8 C) (01/31 0440) Pulse Rate:  [55-79] 62  (01/31 0440) Resp:  [15-22] 18  (01/31 0440) BP: (117-172)/(54-76) 158/54 mmHg (01/31 0440) SpO2:  [91 %-100 %] 99 % (01/31 0440)  Weight change:   Intake/Output: I/O last 3 completed shifts: In: 840 [P.O.:840] Out: 1825 [Urine:1825]  Intake/Output Summary (Last 24 hours) at 12/15/11 0801 Last data filed at 12/15/11 0522  Gross per 24 hour  Intake    840 ml  Output   1575 ml  Net   -735 ml       HEENT: NCAT, NAD. Alert and conversant.  Pulm: nonlabored. CTAB CV: RRR, II/VI sys LLSB.  ABD- Nontender, BS +.  EXT- Trace bilateral pedal edema. SCDs in place. Right chest tunneled catheter, bandage clean NEURO-a/o x3. No gross motor deficits.    Lab Results:  Basename 12/15/11 0540 12/14/11 0426 12/13/11 0625  WBC 5.9 5.8 6.5  HGB 7.8* 7.7* 8.6*  HCT 22.9* 22.4* 25.8*  PLT 29* 70* 86*   BMET  Basename 12/15/11 0540 12/14/11 0426 12/13/11 0625  NA 140 140 140  K 4.7 4.2 3.8  CL 106 104 104  CO2 21 26 25   GLUCOSE 181* 145* 109*  BUN 64* 59* 51*  CREATININE 5.17* 5.32* 4.57*  CALCIUM 8.2* 8.5 8.6  PHOS 4.6 5.2* 5.0*   LFT  Basename 12/15/11 0540  PROT --  ALBUMIN 3.9  AST --  ALT --  ALKPHOS --  BILITOT --  BILIDIR --  IBILI --   PT/INR No results found for this basename: LABPROT:2,INR:2 in the last 72 hours Hepatitis Panel No results found for this basename: HEPBSAG,HCVAB,HEPAIGM,HEPBIGM in the last 72 hours  Studies/Results: No results found.     . therapeutic plasma exchange solution   Dialysis Q1 Hr x 5  . therapeutic plasma exchange solution   Dialysis Once in dialysis  . amLODipine  10 mg Oral Daily  . calcium carbonate  2 tablet Oral Q3H  . calcium  carbonate  2 tablet Oral Q3H  . carvedilol  6.25 mg Oral BID WC  . cyclophosphamide  100 mg Oral Daily  . darbepoetin (ARANESP) injection - DIALYSIS  60 mcg Intravenous Q Mon-HD  . diphenhydrAMINE  25 mg Oral Once  . dutasteride  0.5 mg Oral Daily  . feeding supplement  1 Container Oral BID  . heparin  1,000 Units Intracatheter Once  . heparin subcutaneous  5,000 Units Subcutaneous Q8H  . hydrALAZINE  100 mg Oral BID  . insulin aspart  0-9 Units Subcutaneous TID AC & HS  . insulin aspart  4 Units Subcutaneous TID WC  . insulin glargine  25 Units Subcutaneous QHS  . ketotifen  1 drop Both Eyes BID  . predniSONE  60 mg Oral Q breakfast  . sevelamer  800 mg Oral TID WC  . sodium chloride  10-40 mL Intracatheter Q12H  . Vitamin D (Ergocalciferol)  50,000 Units Oral Q7 days  . DISCONTD: cyclophosphamide  200 mg Oral Daily  . DISCONTD: heparin  1,000 Units Intracatheter Once    Assessment/Plan: 78 yo M with uncontrolled hypertension presents with progressive renal decline, +MPO Ab, +p-ANCA and hemoptysis, concerning for microscopic polyangiitis.  1. Acute kidney disease: Baseline Cr is 0.9-1.2 from Sept 2012. 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 Solumedrol empirically 11/30/11 transitioned to prednisone 1/19. Cytoxan (1/18) and PEX (1/18) every other day for 2 weeks. Has tolerated treatment to date. Continue steroids/cytoxan. PP for another 2 treatments. HD as needed, UOP has improved. Patient has permanent RIJ catheter. Plan for PEX again Friday with albumin as FFP is in short supply. Last HD treatment was 1/26, cr and BUN climbing, will likely require another treatment in near future.  2. ANEMIA-Hgb 6.5 improved to 8.6 with 2u transfusion 1/28. WIll trend, no gross bleeding and likely also a result of iatrogenic pancytopenia. Aranesp weekly.  3. MBD. Stable. Renvela, calcium carbonate and vitamin D q week.  4. HTN/VOL-improved with increased dose norvasc 10mg .Continues on coreg, hydralazine,  dutasteride at home doses. 5. DM. Decent control on Lantus increased to 25units + Novolog 4 units wc + SSI. Increased needs likely due to prednisone. Nutrition following. Resource supplement BID.  6. Thrombocytopenia. Downtrending slowly from 140s since therapy started. Reduced cytoxan dose yesterday. Now down <30. Will DC sq heparin. Monitor for signs of bleeding. Will follow daily. 7.  PPX.SCDs for VTE ppx. 8.  Dispo. PT/OT recommend SNF due to debilitation, CM/SW following, tentative choice of [place] center. Patient from [place] but planning to stay in area during recovery period. Will remain hospitalized throughout treatment course, at least through this week.   LOS: [place] PGY-2 12/15/2011 8:01 AM

## 2011-12-15 NOTE — Plan of Care (Signed)
Problem: Phase III Progression Outcomes Goal: Activity at appropriate level-compared to baseline (UP IN CHAIR FOR HEMODIALYSIS)  Outcome: Progressing Getting there.  [place], [place] [phone] 12/15/2011

## 2011-12-16 ENCOUNTER — Inpatient Hospital Stay (HOSPITAL_COMMUNITY): Payer: Medicare Other

## 2011-12-16 LAB — RENAL FUNCTION PANEL
Calcium: 8.8 mg/dL (ref 8.4–10.5)
GFR calc Af Amer: 11 mL/min — ABNORMAL LOW (ref >90)
Glucose, Bld: 70 mg/dL (ref 70–99)
Phosphorus: 4.5 mg/dL (ref 2.3–4.6)
Sodium: 144 mEq/L (ref 135–145)

## 2011-12-16 LAB — CBC
HCT: 22 % — ABNORMAL LOW (ref 39.0–52.0)
MCHC: 34.5 g/dL (ref 30.0–36.0)
RDW: 16.4 % — ABNORMAL HIGH (ref 11.5–15.5)

## 2011-12-16 LAB — GLUCOSE, CAPILLARY
Glucose-Capillary: 62 mg/dL — ABNORMAL LOW (ref 70–99)
Glucose-Capillary: 130 mg/dL — ABNORMAL HIGH (ref 70–99)
Glucose-Capillary: 249 mg/dL — ABNORMAL HIGH (ref 70–99)

## 2011-12-16 MED ORDER — SODIUM CHLORIDE 0.9 % IV SOLN
Freq: Once | INTRAVENOUS | Status: AC
  Administered 2011-12-16: 18:00:00 via INTRAVENOUS_CENTRAL
  Filled 2011-12-16: qty 200

## 2011-12-16 MED ORDER — SODIUM CHLORIDE 0.9 % IV SOLN
Freq: Once | INTRAVENOUS | Status: AC
  Administered 2011-12-16: 19:00:00 via INTRAVENOUS_CENTRAL
  Filled 2011-12-16: qty 100

## 2011-12-16 MED ORDER — ACD FORMULA A 0.73-2.45-2.2 GM/100ML VI SOLN
Status: AC
  Filled 2011-12-16: qty 500

## 2011-12-16 MED ORDER — SODIUM CHLORIDE 0.9 % IV SOLN
4.0000 g | INTRAVENOUS | Status: DC | PRN
  Filled 2011-12-16: qty 40

## 2011-12-16 MED ORDER — ACD FORMULA A 0.73-2.45-2.2 GM/100ML VI SOLN
500.0000 mL | Status: DC
  Filled 2011-12-16: qty 500

## 2011-12-16 MED ORDER — HEPARIN SODIUM (PORCINE) 1000 UNIT/ML IJ SOLN
1000.0000 [IU] | Freq: Once | INTRAMUSCULAR | Status: DC
  Filled 2011-12-16: qty 1

## 2011-12-16 MED ORDER — DIPHENHYDRAMINE HCL 25 MG PO CAPS
ORAL_CAPSULE | ORAL | Status: AC
  Filled 2011-12-16: qty 1

## 2011-12-16 MED ORDER — SODIUM CHLORIDE 0.9 % IV SOLN
2.0000 g | INTRAVENOUS | Status: DC | PRN
  Filled 2011-12-16: qty 20

## 2011-12-16 MED ORDER — CALCIUM CARBONATE ANTACID 500 MG PO CHEW
CHEWABLE_TABLET | ORAL | Status: AC
  Filled 2011-12-16: qty 4

## 2011-12-16 MED ORDER — ACETAMINOPHEN 325 MG PO TABS: 650.0000 mg | ORAL_TABLET | ORAL | Status: DC | PRN

## 2011-12-16 MED ORDER — ALBUMIN HUMAN 25 % IV SOLN
12.5000 g | Freq: Once | INTRAVENOUS | Status: AC
  Administered 2011-12-16: 12.5 g via INTRAVENOUS
  Filled 2011-12-16: qty 50

## 2011-12-16 MED ORDER — CALCIUM CARBONATE ANTACID 500 MG PO CHEW
2.0000 | CHEWABLE_TABLET | ORAL | Status: DC | PRN
  Filled 2011-12-16: qty 2

## 2011-12-16 MED ORDER — CALCIUM GLUCONATE 10 % IV SOLN
2.0000 g | INTRAVENOUS | Status: DC | PRN
  Filled 2011-12-16: qty 20

## 2011-12-16 MED ORDER — CALCIUM CARBONATE ANTACID 500 MG PO CHEW: 2.0000 | CHEWABLE_TABLET | ORAL | Status: DC

## 2011-12-16 MED ORDER — DIPHENHYDRAMINE HCL 25 MG PO CAPS: 25.0000 mg | ORAL_CAPSULE | Freq: Four times a day (QID) | ORAL | Status: DC | PRN

## 2011-12-16 NOTE — Progress Notes (Addendum)
Subjective: Interval History: none.  Objective: Vital signs in last 24 hours:  Temp:  [97.6 F (36.4 C)-97.8 F (36.6 C)] 97.6 F (36.4 C) (02/01 0500) Pulse Rate:  [58-69] 58  (02/01 0500) Resp:  [17-19] 19  (02/01 0500) BP: (126-154)/(48-75) 143/75 mmHg (02/01 0500) SpO2:  [95 %-100 %] 99 % (02/01 0500) Weight:  [115.6 kg (254 lb 13.6 oz)] 115.6 kg (254 lb 13.6 oz) (01/31 2101)  Weight change:   Intake/Output: I/O last 3 completed shifts: In: 1080 [P.O.:1080] Out: 2050 [Urine:2050]   Intake/Output this shift:     HEENT: NCAT, NAD. Alert and conversant.  Pulm: . CTAB  CV: RRR, II/VI ABD- Nontender, BS +.  EXT- no pedal edema. SCDs in place. Right chest tunneled catheter, bandage clean  NEURO-a/o x3. No gross motor deficits.    Lab Results:  Basename 12/16/11 0625 12/15/11 1203 12/15/11 0540  WBC 6.9 9.1 5.9  HGB 7.6* 8.2* 7.8*  HCT 22.0* 23.9* 22.9*  PLT 72* 71* 29*   BMET  Basename 12/16/11 0625 12/15/11 0540 12/14/11 0426  NA 144 140 140  K 3.8 4.7 4.2  CL 108 106 104  CO2 24 21 26   GLUCOSE 70 181* 145*  BUN 71* 64* 59*  CREATININE 5.29* 5.17* 5.32*  CALCIUM 8.8 8.2* 8.5  PHOS 4.5 4.6 5.2*   LFT  Basename 12/16/11 0625  PROT --  ALBUMIN 3.8  AST --  ALT --  ALKPHOS --  BILITOT --  BILIDIR --  IBILI --   PT/INR No results found for this basename: LABPROT:2,INR:2 in the last 72 hours Hepatitis Panel No results found for this basename: HEPBSAG,HCVAB,HEPAIGM,HEPBIGM in the last 72 hours  Studies/Results: No results found.  I have reviewed the patient's current medications.  Assessment/Plan: 1. Acute kidney disease: Baseline Cr is 0.9-1.2 from Sept 2012. Confirmed vasculitis on biopsy. Solumedrol empirically 11/30/11 transitioned to prednisone 1/19. Cytoxan (1/18) and PEX (1/18) every other day for 2 weeks. Has tolerated treatment to date. Continue steroids/cytoxan. \. HD as needed, UOP has improved. Patient has permanent RIJ catheter. Plan  for PEX again Friday with albumin stable,  2. ANEMIA-stable  3. MBD. Stable. Renvela, calcium carbonate and vitamin D q week.  4. HTN/VOL-improved with increased dose norvasc 10mg .Continues on coreg, hydralazine, dutasteride at home doses.improved 5. DM. Decent control on Lantus increased to 25units + Novolog 4 units wc + SSI. Increased needs likely due to prednisone. Nutrition following. Resource supplement BID.  6. Thrombocytopenia. Stopped heparin adjust pheresis machine to reduce removal of platelets 7. PPX.SCDs for VTE ppx.  8. Dispo. PT/OT recommend SNF due to debilitation, CM/SW following, tentative choice of [place] center. Patient from [place] but planning to stay in area during recovery period. Will remain hospitalized throughout treatment course, at least through this week..   LOS: 22 Shankar Silber W @TODAY @9 :12 AM

## 2011-12-16 NOTE — Progress Notes (Signed)
Pt in hemo

## 2011-12-16 NOTE — Progress Notes (Signed)
Physical Therapy Treatment and Goal/Discharge Update Patient Details Name: Ryan Jimenez MRN: [id number] DOB: 1932-06-30 Today's Date: 12/16/2011  PT Assessment/Plan  PT - Assessment/Plan Comments on Treatment Session: Mr. Freer has made significant progress with his functional mobility.  At this time he is safe and appropriate (functionally) to return to his home with his wifes supervison and HHPT.  Discussed this with Mr. Szczesniak and he was delighted.  Suggesting D/C home with HHPT, Rolling walker, and 3 in 1.  Acute PT will continue to follow pt.   PT Plan: Discharge plan needs to be updated;Frequency needs to be updated;Equipment recommendations need to be updated PT Frequency: Min 3X/week Follow Up Recommendations: Home health PT;Supervision - Intermittent Equipment Recommended: Rolling walker with 5" wheels;3 in 1 bedside comode PT Goals  Acute Rehab PT Goals PT Goal Formulation: With patient Time For Goal Achievement: 7 days (From 12/16/11) Pt will go Supine/Side to Sit: Independently PT Goal: Supine/Side to Sit - Progress: Met Pt will Sit at Edge of Bed: Independently;6-10 min;with no upper extremity support PT Goal: Sit at Edge Of Bed - Progress: Met Pt will go Sit to Supine/Side: Independently;with HOB 0 degrees PT Goal: Sit to Supine/Side - Progress: Met Pt will go Sit to Stand: Independently;with upper extremity assist;without upper extremity assist PT Goal: Sit to Stand - Progress: Progressing toward goal Pt will go Stand to Sit: Independently;without upper extremity assist;with upper extremity assist PT Goal: Stand to Sit - Progress: Progressing toward goal Pt will Transfer Bed to Chair/Chair to Bed: with modified independence PT Transfer Goal: Bed to Chair/Chair to Bed - Progress: Goal set today Pt will Stand: 3 - 5 min;with no upper extremity support PT Goal: Stand - Progress: Progressing toward goal Pt will Ambulate: >150 feet;with modified independence;with least  restrictive assistive device PT Goal: Ambulate - Progress: Goal set today Pt will Go Up / Down Stairs: 3-5 stairs;with modified independence;with least restrictive assistive device PT Goal: Up/Down Stairs - Progress: Goal set today Pt will Perform Home Exercise Program: Independently PT Goal: Perform Home Exercise Program - Progress: Not met Additional Goals Additional Goal #1: Demonstrate Vestibular exercises with out cuing and verbalize performing exercises as suggested throughout the  day.  PT Goal: Additional Goal #1 - Progress: Met  PT Treatment Precautions/Restrictions  Precautions Precautions: Fall Precaution Comments: I wouldn't call it dizzy, just blurry Required Braces or Orthoses: No Restrictions Weight Bearing Restrictions: No Mobility (including Balance) Bed Mobility Bed Mobility: Yes Rolling Right: Not tested (comment) Right Sidelying to Sit: Not tested (comment) Supine to Sit: 6: Modified independent (Device/Increase time);With rails;HOB flat Sitting - Scoot to Edge of Bed: 7: Independent Sit to Supine: 7: Independent;HOB flat Transfers Transfers: Yes Sit to Stand: 6: Modified independent (Device/Increase time);From bed;From chair/3-in-1;With armrests;With upper extremity assist Sit to Stand Details (indicate cue type and reason): No assistance required. Pt reports no dizziness  Stand to Sit: With upper extremity assist;To bed;To chair/3-in-1;5: Supervision Stand to Sit Details: verbal cues for hand placement  Stand Pivot Transfers: 5: Supervision Ambulation/Gait Ambulation/Gait: Yes Ambulation/Gait Assistance: 6: Modified independent (Device/Increase time) Ambulation/Gait Assistance Details (indicate cue type and reason): Occasional verbal cues to stay within walker for Upright trunk posture.  Ambulation Distance (Feet): 300 Feet (150 feet two trials) Assistive device: Rolling walker Gait Pattern: Right flexed knee in stance (Occsionally  ) Gait velocity:  WFL Stairs: Yes Stairs Assistance: 5: Supervision Stairs Assistance Details (indicate cue type and reason): Instucted pt to ascend and descend 2 steps usuing  backward method with RW . Pt required assist to stabilize walker when legs of walker are uneven. Instructed pt to ascend /descend 4 steps using anterior method with 1 rail and RW. Pt preferred second method and was much safer using the rail  Stair Management Technique: One rail Right;No rails;Backwards;Forwards;With walker;Step to pattern (Trial 1: Backward, RW, no rail ; 2 forward 1 rail RW., ) Number of Stairs: 6  (trial 1 =2 stairs ; trial 2 = 4 stairs.) Height of Stairs: 6  Wheelchair Mobility Wheelchair Mobility: No  Posture/Postural Control Posture/Postural Control: No significant limitations Balance Balance Assessed: Yes Dynamic Sitting Balance Dynamic Sitting - Balance Activities: Other (comment);Forward lean/weight shifting;Lateral lean/weight shifting;Reaching across midline (Pt donned socks and robe independently on EOB) Exercise    End of Session PT - End of Session Equipment Utilized During Treatment: Gait belt Activity Tolerance: Patient tolerated treatment well;Patient limited by fatigue Patient left: in bed;with call bell in reach Nurse Communication: Mobility status for ambulation;Mobility status for transfers;Other (comment) (change in discharge recommendations) General Behavior During Session: [facility] for tasks performed Cognition: [facility] for tasks performed  Juline Sanderford 12/16/2011, 1:07 PM Jaydan Chretien L. Paris Hohn DPT [phone]

## 2011-12-17 LAB — RENAL FUNCTION PANEL
Albumin: 4.1 g/dL (ref 3.5–5.2)
Calcium: 8.7 mg/dL (ref 8.4–10.5)
Chloride: 110 mEq/L (ref 96–112)
Creatinine, Ser: 4.96 mg/dL — ABNORMAL HIGH (ref 0.50–1.35)
GFR calc non Af Amer: 10 mL/min — ABNORMAL LOW (ref >90)
Phosphorus: 4.5 mg/dL (ref 2.3–4.6)

## 2011-12-17 LAB — CBC
MCH: 28.1 pg (ref 26.0–34.0)
MCHC: 33.9 g/dL (ref 30.0–36.0)
Platelets: 68 10*3/uL — ABNORMAL LOW (ref 150–400)
RDW: 16.7 % — ABNORMAL HIGH (ref 11.5–15.5)

## 2011-12-17 LAB — GLUCOSE, CAPILLARY: Glucose-Capillary: 109 mg/dL — ABNORMAL HIGH (ref 70–99)

## 2011-12-17 MED ORDER — CARVEDILOL 6.25 MG PO TABS: 6.2500 mg | ORAL_TABLET | Freq: Two times a day (BID) | ORAL | Status: DC

## 2011-12-17 MED ORDER — PREDNISONE 20 MG PO TABS: 60.0000 mg | ORAL_TABLET | Freq: Every day | ORAL | Status: AC

## 2011-12-17 MED ORDER — CYCLOPHOSPHAMIDE 50 MG PO TABS: 100.0000 mg | ORAL_TABLET | Freq: Every day | ORAL | Status: AC

## 2011-12-17 MED ORDER — KETOTIFEN FUMARATE 0.025 % OP SOLN: 1.0000 [drp] | Freq: Two times a day (BID) | OPHTHALMIC | Status: AC

## 2011-12-17 MED ORDER — HYDRALAZINE HCL 100 MG PO TABS: 100.0000 mg | ORAL_TABLET | Freq: Two times a day (BID) | ORAL | Status: DC

## 2011-12-17 MED ORDER — SEVELAMER CARBONATE 800 MG PO TABS: 800.0000 mg | ORAL_TABLET | Freq: Three times a day (TID) | ORAL | Status: DC

## 2011-12-17 MED ORDER — AMLODIPINE BESYLATE 10 MG PO TABS: 10.0000 mg | ORAL_TABLET | Freq: Every day | ORAL | Status: DC

## 2011-12-17 NOTE — Consult Note (Signed)
   CARE MANAGEMENT NOTE 12/17/2011  Patient:  Ryan Jimenez, Ryan Jimenez   Account Number:  [id number]  Date Initiated:  12/12/2011  Documentation initiated by:  Magdalen Spatz  Subjective/Objective Assessment:   DX: Anca palyangitis / vasculitis    HD and plasmaphersis started this admission     Action/Plan:   from [place] , will go to SNF here for rehab at DC   Anticipated DC Date:  12/16/2011   Anticipated DC Plan:  [place]  In-house referral  Clinical Social Worker      [place]  CM consult      [facility] Choice  HOME HEALTH   Choice offered to / List presented to:  C-3 Spouse   DME arranged  3-N-1  [place]      DME agency  [place] arranged  HH-2 PT      [place]   Status of service:  Completed, signed off Medicare Important Message given?   (If response is "NO", the following Medicare IM given date fields will be blank) Date Medicare IM given:   Date Additional Medicare IM given:    Discharge Disposition:  [place]  Per UR Regulation:  Reviewed for med. necessity/level of care/duration of stay  Comments:  12/17/2011 1645 Contacted wife at home. Offered choice for [facility] agency. States she does not have a preference for [facility]. Spouse decided with [place]. Contacted [facility] with [place] orders and faxed d/c summary, orders, F2F, and facesheet. Contacted [facility] for DME for scheduled d/c today. Jonnie Finner RN CCM Case Mgmt phone [phone]

## 2011-12-17 NOTE — Discharge Summary (Signed)
Physician Discharge Summary  Patient ID: Ryan Jimenez MRN: [id number] DOB/AGE: April 17, 1932 78 y.o.  Admit date: 11/23/2011 Discharge date: 12/17/2011  Discharge Diagnoses:  Principal Problem:  *Renal failure Active Problems:  Hypertension  Hypercholesterolemia  Diabetes mellitus type II  Benign positional vertigo   Discharged Condition: stable  Hospital Course: 78 yo AA male with a known history of DM, Hyperlipidemia, Gout, and difficult to control HTN who presented to his PCP on 11/16/11 for further evaluation of intermittent and progressively worsening syncope, dizziness associated with blurred vision, nausea and vomiting and was found to have an elevated creatinine of 2.63 and baseline of 1.0. He was admitted for further evaluation and treatment of Acute Renal Failure. Renal consult requested and he was seen by Dr. Elmarie Shiley in consultation on 11/24/11. He had an unsuccesful attempt for renal biopsy on 11/30/11 and developed a perinephric hematoma with associated hematuria, which has now cleared. 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 On 12/02/11 he was started on Cytoxan and following placement of a tunneled dialysis catheter, he underwent his first hemodialysis and plasmapheresis exchange. He completed two weeks of successful plasmapheresis exchanges (a total of 6) and last occuring on yesterday 12/16/11 prior to the date of this dictation. He had temporary dialysis for about one week, and had his last hemodialysis treatment one week ago and his creatinine level is steadily falling with a level of 4.96 noted at discharge. He currently has no uremic symptoms and will be discharged on oral Cytoxan and prednisone, with plans tohave  follow up labs done in the office at [facility] in 1 week as well as be seen in follow up by Dr. Elmarie Shiley within two weeks. He will also have his tunneled dialysis catheter removed on Monday 12/19/11 by IR.    Treatments: procedures: biopsy: renal-failed attempt on 11/30/11; successful attempt 12/01/11, Dr. Toni Amend Hoss (IR); Right IJ Hemosplit TDC placement 12/02/11, Dr. Markus Daft; Plasmapheresis (PEX x 2 wks- 6 sessions; initial on 1/18; last on 12/16/11)   Blood pressure 149/87, pulse 58, temperature 98.4 F (36.9 C), temperature source Oral, resp. rate 20, height 5\' 9"  (1.753 m), weight 115.6 kg (254 lb 13.6 oz), SpO2 100.00%.  Disposition: Home or Self Care   Medication List  As of 12/17/2011  5:01 PM   ASK your doctor about these medications         allopurinol 100 MG tablet   Commonly known as: ZYLOPRIM   Take 100 mg by mouth daily.      aspirin 81 MG EC tablet   Take 81 mg by mouth daily. Swallow whole.      carvedilol 3.125 MG tablet   Commonly known as: COREG   Take 3.125 mg by mouth 2 (two) times daily with a meal.      colchicine 0.6 MG tablet   Take 0.6 mg by mouth 2 (two) times daily as needed. For gout      dutasteride 0.5 MG capsule   Commonly known as: AVODART   Take 0.5 mg by mouth daily.      glimepiride 4 MG tablet   Commonly known as: AMARYL   Take 4 mg by mouth daily as needed. WHEN BLOOD SUGAR RUNS TOO HIGH      hydrALAZINE  100 MG tablet   Commonly known as: APRESOLINE   Take 100 mg by mouth 2 (two) times daily.      insulin glargine 100 UNIT/ML injection   Commonly known as: LANTUS   Inject 10 Units into the skin daily as needed. STOPPED BECAUSE BLOOD SUGAR RAN TOO LOW...STILL HAS SOME IF NEEDED      meclizine 25 MG tablet   Commonly known as: ANTIVERT   Take 25 mg by mouth 3 (three) times daily as needed. DIZZINESS      naproxen sodium 220 MG tablet   Commonly known as: ANAPROX   Take 220 mg by mouth 2 (two) times daily with a meal.       Tamsulosin HCl 0.4 MG Caps   Commonly known as: FLOMAX   Take 0.4 mg by mouth daily.      terazosin 5 MG capsule   Commonly known as: HYTRIN   Take 5 mg by mouth daily.      valsartan-hydrochlorothiazide 320-25 MG per tablet   Commonly known as: DIOVAN-HCT   Take 1 tablet by mouth daily.      Vitamin D (Ergocalciferol) 50000 UNITS Caps   Commonly known as: DRISDOL   Take 50,000 Units by mouth every 7 (seven) days. Take on sundays           Follow-up Information    Follow up with AVBUERE,EDWIN A, MD. Schedule an appointment as soon as possible for a visit in 2 weeks. (needs follow up appointment within 2 wks)       Follow up with [facility]. (Home Health Physical Therapy)    Contact information:   [phone]      Follow up with Ulla Potash., MD. Schedule an appointment as soon as possible for a visit in 1 week. (needs labs in office (CBC, Renal Panel) within 1 wk)    Contact information:   [place] [phone]       Follow up with Ulla Potash., MD. Schedule an appointment as soon as possible for a visit in 2 weeks. [facility] Follow up appointment needed within  2 wks)    Contact information:   [place] [phone]          Signed: Marni Griffon, [facility] Pager [phone] 12/17/2011, 5:01 PM  Attending Physician: Dr. Roney Jaffe

## 2011-12-19 ENCOUNTER — Ambulatory Visit (HOSPITAL_COMMUNITY)
Admission: RE | Admit: 2011-12-19 | Discharge: 2011-12-19 | Disposition: A | Discharge status: Home / Self Care | Payer: Medicare Other | Source: Ambulatory Visit | Attending: Nephrology | Admitting: Nephrology

## 2011-12-19 ENCOUNTER — Other Ambulatory Visit (HOSPITAL_COMMUNITY): Payer: Self-pay | Admitting: Nephrology

## 2011-12-19 DIAGNOSIS — N179 Acute kidney failure, unspecified: Secondary | ICD-10-CM

## 2011-12-19 DIAGNOSIS — Z4901 Encounter for fitting and adjustment of extracorporeal dialysis catheter: Secondary | ICD-10-CM | POA: Insufficient documentation

## 2011-12-19 DIAGNOSIS — N19 Unspecified kidney failure: Secondary | ICD-10-CM | POA: Insufficient documentation

## 2011-12-19 MED ORDER — CHLORHEXIDINE GLUCONATE 4 % EX LIQD
CUTANEOUS | Status: AC
  Filled 2011-12-19: qty 30

## 2011-12-19 NOTE — Procedures (Signed)
RIJV HD catheter removal No comp 

## 2011-12-20 LAB — HEPARIN INDUCED THROMBOCYTOPENIA PNL
Patient O.D.: 0.269
UFH High Dose UFH H: 0 % Release

## 2011-12-22 ENCOUNTER — Telehealth (HOSPITAL_COMMUNITY): Payer: Self-pay

## 2012-01-09 ENCOUNTER — Other Ambulatory Visit (HOSPITAL_COMMUNITY): Payer: Self-pay | Admitting: *Deleted

## 2012-01-11 ENCOUNTER — Encounter (HOSPITAL_COMMUNITY)
Admission: RE | Admit: 2012-01-11 | Discharge: 2012-01-11 | Disposition: A | Discharge status: Home / Self Care | Payer: PRIVATE HEALTH INSURANCE | Source: Ambulatory Visit | Attending: Nephrology | Admitting: Nephrology

## 2012-01-11 DIAGNOSIS — N184 Chronic kidney disease, stage 4 (severe): Secondary | ICD-10-CM | POA: Insufficient documentation

## 2012-01-11 DIAGNOSIS — D649 Anemia, unspecified: Secondary | ICD-10-CM | POA: Insufficient documentation

## 2012-01-11 LAB — FERRITIN: Ferritin: 395 ng/mL — ABNORMAL HIGH (ref 22–322)

## 2012-01-11 MED ORDER — EPOETIN ALFA 10000 UNIT/ML IJ SOLN
INTRAMUSCULAR | Status: AC
  Administered 2012-01-11: 10000 [IU] via SUBCUTANEOUS
  Filled 2012-01-11: qty 1

## 2012-01-11 MED ORDER — EPOETIN ALFA 10000 UNIT/ML IJ SOLN: 10000.0000 [IU] | INTRAMUSCULAR | Status: DC

## 2012-01-18 ENCOUNTER — Encounter (HOSPITAL_COMMUNITY)
Admission: RE | Admit: 2012-01-18 | Discharge: 2012-01-18 | Disposition: A | Discharge status: Home / Self Care | Payer: Medicare Other | Source: Ambulatory Visit | Attending: Nephrology | Admitting: Nephrology

## 2012-01-18 DIAGNOSIS — N184 Chronic kidney disease, stage 4 (severe): Secondary | ICD-10-CM | POA: Insufficient documentation

## 2012-01-18 DIAGNOSIS — D649 Anemia, unspecified: Secondary | ICD-10-CM | POA: Insufficient documentation

## 2012-01-18 LAB — POCT HEMOGLOBIN-HEMACUE: Hemoglobin: 8.7 g/dL — ABNORMAL LOW (ref 13.0–17.0)

## 2012-01-18 MED ORDER — EPOETIN ALFA 10000 UNIT/ML IJ SOLN
10000.0000 [IU] | INTRAMUSCULAR | Status: DC
  Administered 2012-01-18: 10000 [IU] via SUBCUTANEOUS

## 2012-01-18 MED ORDER — EPOETIN ALFA 10000 UNIT/ML IJ SOLN
INTRAMUSCULAR | Status: AC
  Administered 2012-01-18: 10000 [IU] via SUBCUTANEOUS
  Filled 2012-01-18: qty 1

## 2012-01-23 ENCOUNTER — Other Ambulatory Visit (HOSPITAL_COMMUNITY): Payer: Self-pay | Admitting: *Deleted

## 2012-01-25 ENCOUNTER — Encounter (HOSPITAL_COMMUNITY)
Admission: RE | Admit: 2012-01-25 | Discharge: 2012-01-25 | Disposition: A | Discharge status: Home / Self Care | Payer: Medicare Other | Source: Ambulatory Visit | Attending: Nephrology | Admitting: Nephrology

## 2012-01-25 LAB — POCT HEMOGLOBIN-HEMACUE: Hemoglobin: 10.4 g/dL — ABNORMAL LOW (ref 13.0–17.0)

## 2012-01-25 MED ORDER — EPOETIN ALFA 20000 UNIT/ML IJ SOLN
20000.0000 [IU] | INTRAMUSCULAR | Status: DC
  Administered 2012-01-25: 20000 [IU] via SUBCUTANEOUS

## 2012-01-25 MED ORDER — EPOETIN ALFA 20000 UNIT/ML IJ SOLN
INTRAMUSCULAR | Status: AC
  Filled 2012-01-25: qty 1

## 2012-01-31 ENCOUNTER — Other Ambulatory Visit (HOSPITAL_COMMUNITY): Payer: Self-pay | Admitting: *Deleted

## 2012-02-01 ENCOUNTER — Encounter (HOSPITAL_COMMUNITY)
Admission: RE | Admit: 2012-02-01 | Discharge: 2012-02-01 | Disposition: A | Discharge status: Home / Self Care | Payer: Medicare Other | Source: Ambulatory Visit | Attending: Nephrology | Admitting: Nephrology

## 2012-02-01 LAB — URINALYSIS, MICROSCOPIC ONLY
Ketones, ur: NEGATIVE mg/dL
Leukocytes, UA: NEGATIVE
Nitrite: NEGATIVE
Specific Gravity, Urine: 1.011 (ref 1.005–1.030)
pH: 6.5 (ref 5.0–8.0)

## 2012-02-01 LAB — DIFFERENTIAL
Basophils Relative: 0 % (ref 0–1)
Eosinophils Absolute: 0.1 10*3/uL (ref 0.0–0.7)
Eosinophils Relative: 2 % (ref 0–5)
Lymphs Abs: 0.2 10*3/uL — ABNORMAL LOW (ref 0.7–4.0)
Monocytes Absolute: 0.1 10*3/uL (ref 0.1–1.0)
Neutro Abs: 2.1 10*3/uL (ref 1.7–7.7)

## 2012-02-01 LAB — CBC
Hemoglobin: 10.8 g/dL — ABNORMAL LOW (ref 13.0–17.0)
MCH: 30.3 pg (ref 26.0–34.0)
MCHC: 33.3 g/dL (ref 30.0–36.0)
MCV: 91 fL (ref 78.0–100.0)
RBC: 3.56 MIL/uL — ABNORMAL LOW (ref 4.22–5.81)

## 2012-02-01 LAB — RENAL FUNCTION PANEL
BUN: 44 mg/dL — ABNORMAL HIGH (ref 6–23)
CO2: 28 mEq/L (ref 19–32)
Calcium: 9.2 mg/dL (ref 8.4–10.5)
Chloride: 98 mEq/L (ref 96–112)
Creatinine, Ser: 2.01 mg/dL — ABNORMAL HIGH (ref 0.50–1.35)

## 2012-02-01 MED ORDER — EPOETIN ALFA 20000 UNIT/ML IJ SOLN
20000.0000 [IU] | INTRAMUSCULAR | Status: DC
  Administered 2012-02-01: 20000 [IU] via SUBCUTANEOUS

## 2012-02-01 MED ORDER — EPOETIN ALFA 20000 UNIT/ML IJ SOLN
INTRAMUSCULAR | Status: AC
  Filled 2012-02-01: qty 1

## 2012-02-08 ENCOUNTER — Encounter (HOSPITAL_COMMUNITY)
Admission: RE | Admit: 2012-02-08 | Discharge: 2012-02-08 | Disposition: A | Discharge status: Home / Self Care | Payer: Medicare Other | Source: Ambulatory Visit | Attending: Nephrology | Admitting: Nephrology

## 2012-02-08 LAB — IRON AND TIBC: Saturation Ratios: 9 % — ABNORMAL LOW (ref 20–55)

## 2012-02-08 MED ORDER — EPOETIN ALFA 20000 UNIT/ML IJ SOLN
INTRAMUSCULAR | Status: AC
  Administered 2012-02-08: 20000 [IU] via SUBCUTANEOUS
  Filled 2012-02-08: qty 1

## 2012-02-08 MED ORDER — EPOETIN ALFA 20000 UNIT/ML IJ SOLN
20000.0000 [IU] | INTRAMUSCULAR | Status: DC
  Administered 2012-02-08: 20000 [IU] via SUBCUTANEOUS

## 2012-02-15 ENCOUNTER — Encounter (HOSPITAL_COMMUNITY): Payer: PRIVATE HEALTH INSURANCE

## 2012-02-15 ENCOUNTER — Encounter (HOSPITAL_COMMUNITY)
Admission: RE | Admit: 2012-02-15 | Discharge: 2012-02-15 | Disposition: A | Discharge status: Home / Self Care | Payer: Medicare Other | Source: Ambulatory Visit | Attending: Nephrology | Admitting: Nephrology

## 2012-02-15 DIAGNOSIS — D649 Anemia, unspecified: Secondary | ICD-10-CM | POA: Insufficient documentation

## 2012-02-15 DIAGNOSIS — N184 Chronic kidney disease, stage 4 (severe): Secondary | ICD-10-CM | POA: Insufficient documentation

## 2012-02-15 LAB — POCT HEMOGLOBIN-HEMACUE: Hemoglobin: 10.5 g/dL — ABNORMAL LOW (ref 13.0–17.0)

## 2012-02-15 MED ORDER — SODIUM CHLORIDE 0.9 % IV SOLN
1020.0000 mg | Freq: Once | INTRAVENOUS | Status: AC
  Administered 2012-02-15: 1020 mg via INTRAVENOUS
  Filled 2012-02-15: qty 34

## 2012-02-15 MED ORDER — SODIUM CHLORIDE 0.9 % IV SOLN
INTRAVENOUS | Status: DC
  Administered 2012-02-15: 250 mL via INTRAVENOUS

## 2012-02-15 MED ORDER — EPOETIN ALFA 20000 UNIT/ML IJ SOLN
INTRAMUSCULAR | Status: AC
  Administered 2012-02-15: 20000 [IU] via SUBCUTANEOUS
  Filled 2012-02-15: qty 1

## 2012-02-16 LAB — ANCA SCREEN W REFLEX TITER: Atypical p-ANCA Screen: NEGATIVE

## 2012-02-16 LAB — ANCA TITERS: P-ANCA: 1:320 {titer} — ABNORMAL HIGH

## 2012-02-20 ENCOUNTER — Other Ambulatory Visit (HOSPITAL_COMMUNITY): Payer: Self-pay | Admitting: *Deleted

## 2012-02-22 ENCOUNTER — Encounter (HOSPITAL_COMMUNITY)
Admission: RE | Admit: 2012-02-22 | Discharge: 2012-02-22 | Disposition: A | Discharge status: Home / Self Care | Payer: Medicare Other | Source: Ambulatory Visit | Attending: Nephrology | Admitting: Nephrology

## 2012-02-22 LAB — POCT HEMOGLOBIN-HEMACUE: Hemoglobin: 11.8 g/dL — ABNORMAL LOW (ref 13.0–17.0)

## 2012-02-22 LAB — BASIC METABOLIC PANEL
Chloride: 100 mEq/L (ref 96–112)
Creatinine, Ser: 1.74 mg/dL — ABNORMAL HIGH (ref 0.50–1.35)
GFR calc Af Amer: 42 mL/min — ABNORMAL LOW (ref >90)

## 2012-02-22 MED ORDER — EPOETIN ALFA 20000 UNIT/ML IJ SOLN
20000.0000 [IU] | INTRAMUSCULAR | Status: DC
  Administered 2012-02-22: 20000 [IU] via SUBCUTANEOUS
  Filled 2012-02-22: qty 1

## 2012-02-29 ENCOUNTER — Encounter (HOSPITAL_COMMUNITY)
Admission: RE | Admit: 2012-02-29 | Discharge: 2012-02-29 | Disposition: A | Discharge status: Home / Self Care | Payer: Medicare Other | Source: Ambulatory Visit | Attending: Nephrology | Admitting: Nephrology

## 2012-02-29 MED ORDER — EPOETIN ALFA 20000 UNIT/ML IJ SOLN
20000.0000 [IU] | INTRAMUSCULAR | Status: DC
  Administered 2012-02-29: 20000 [IU] via SUBCUTANEOUS

## 2012-02-29 MED ORDER — EPOETIN ALFA 20000 UNIT/ML IJ SOLN
INTRAMUSCULAR | Status: AC
  Administered 2012-02-29: 20000 [IU] via SUBCUTANEOUS
  Filled 2012-02-29: qty 1

## 2012-03-06 ENCOUNTER — Other Ambulatory Visit (HOSPITAL_COMMUNITY): Payer: Self-pay | Admitting: *Deleted

## 2012-03-07 ENCOUNTER — Encounter (HOSPITAL_COMMUNITY)
Admission: RE | Admit: 2012-03-07 | Discharge: 2012-03-07 | Disposition: A | Discharge status: Home / Self Care | Payer: Medicare Other | Source: Ambulatory Visit | Attending: Nephrology | Admitting: Nephrology

## 2012-03-07 LAB — CBC
MCV: 90.6 fL (ref 78.0–100.0)
Platelets: 120 10*3/uL — ABNORMAL LOW (ref 150–400)
RBC: 3.94 MIL/uL — ABNORMAL LOW (ref 4.22–5.81)
RDW: 17.7 % — ABNORMAL HIGH (ref 11.5–15.5)
WBC: 4.3 10*3/uL (ref 4.0–10.5)

## 2012-03-07 LAB — RENAL FUNCTION PANEL
Albumin: 2.9 g/dL — ABNORMAL LOW (ref 3.5–5.2)
CO2: 23 mEq/L (ref 19–32)
Chloride: 102 mEq/L (ref 96–112)
Creatinine, Ser: 1.74 mg/dL — ABNORMAL HIGH (ref 0.50–1.35)
GFR calc Af Amer: 42 mL/min — ABNORMAL LOW (ref >90)
GFR calc non Af Amer: 36 mL/min — ABNORMAL LOW (ref >90)
Potassium: 4 mEq/L (ref 3.5–5.1)
Sodium: 139 mEq/L (ref 135–145)

## 2012-03-07 LAB — IRON AND TIBC
Saturation Ratios: 16 % — ABNORMAL LOW (ref 20–55)
UIBC: 141 ug/dL (ref 125–400)

## 2012-03-07 MED ORDER — EPOETIN ALFA 20000 UNIT/ML IJ SOLN
INTRAMUSCULAR | Status: AC
  Filled 2012-03-07: qty 1

## 2012-03-07 MED ORDER — EPOETIN ALFA 20000 UNIT/ML IJ SOLN
20000.0000 [IU] | INTRAMUSCULAR | Status: DC
  Administered 2012-03-07: 20000 [IU] via SUBCUTANEOUS

## 2012-03-14 ENCOUNTER — Encounter (HOSPITAL_COMMUNITY)
Admission: RE | Admit: 2012-03-14 | Discharge: 2012-03-14 | Disposition: A | Discharge status: Home / Self Care | Payer: Medicare Other | Source: Ambulatory Visit | Attending: Nephrology | Admitting: Nephrology

## 2012-03-14 DIAGNOSIS — N184 Chronic kidney disease, stage 4 (severe): Secondary | ICD-10-CM | POA: Insufficient documentation

## 2012-03-14 DIAGNOSIS — D649 Anemia, unspecified: Secondary | ICD-10-CM | POA: Insufficient documentation

## 2012-03-14 LAB — POCT HEMOGLOBIN-HEMACUE: Hemoglobin: 11.6 g/dL — ABNORMAL LOW (ref 13.0–17.0)

## 2012-03-14 MED ORDER — EPOETIN ALFA 20000 UNIT/ML IJ SOLN
20000.0000 [IU] | INTRAMUSCULAR | Status: DC
  Administered 2012-03-14: 20000 [IU] via SUBCUTANEOUS
  Filled 2012-03-14: qty 1

## 2012-03-21 ENCOUNTER — Encounter (HOSPITAL_COMMUNITY): Payer: PRIVATE HEALTH INSURANCE

## 2012-03-22 ENCOUNTER — Encounter (HOSPITAL_COMMUNITY): Payer: PRIVATE HEALTH INSURANCE

## 2012-03-29 ENCOUNTER — Encounter (HOSPITAL_COMMUNITY)
Admission: RE | Admit: 2012-03-29 | Discharge: 2012-03-29 | Disposition: A | Discharge status: Home / Self Care | Payer: Medicare Other | Source: Ambulatory Visit | Attending: Nephrology | Admitting: Nephrology

## 2012-03-29 MED ORDER — EPOETIN ALFA 20000 UNIT/ML IJ SOLN
20000.0000 [IU] | INTRAMUSCULAR | Status: DC
  Administered 2012-03-29: 20000 [IU] via SUBCUTANEOUS
  Filled 2012-03-29: qty 1

## 2012-04-04 ENCOUNTER — Emergency Department (HOSPITAL_COMMUNITY): Payer: Medicare Other

## 2012-04-04 ENCOUNTER — Inpatient Hospital Stay (HOSPITAL_COMMUNITY)
Admission: EM | Admit: 2012-04-04 | Discharge: 2012-04-08 | DRG: 602 | Disposition: A | Discharge status: Home / Self Care | Payer: Medicare Other | Source: Ambulatory Visit | Attending: Family Medicine | Admitting: Family Medicine

## 2012-04-04 ENCOUNTER — Encounter (HOSPITAL_COMMUNITY): Payer: Self-pay

## 2012-04-04 DIAGNOSIS — Z79899 Other long term (current) drug therapy: Secondary | ICD-10-CM

## 2012-04-04 DIAGNOSIS — L732 Hidradenitis suppurativa: Secondary | ICD-10-CM | POA: Diagnosis present

## 2012-04-04 DIAGNOSIS — IMO0002 Reserved for concepts with insufficient information to code with codable children: Principal | ICD-10-CM | POA: Diagnosis present

## 2012-04-04 DIAGNOSIS — M109 Gout, unspecified: Secondary | ICD-10-CM | POA: Diagnosis present

## 2012-04-04 DIAGNOSIS — D649 Anemia, unspecified: Secondary | ICD-10-CM | POA: Diagnosis present

## 2012-04-04 DIAGNOSIS — I129 Hypertensive chronic kidney disease with stage 1 through stage 4 chronic kidney disease, or unspecified chronic kidney disease: Secondary | ICD-10-CM | POA: Diagnosis present

## 2012-04-04 DIAGNOSIS — I10 Essential (primary) hypertension: Secondary | ICD-10-CM | POA: Diagnosis present

## 2012-04-04 DIAGNOSIS — R5081 Fever presenting with conditions classified elsewhere: Secondary | ICD-10-CM

## 2012-04-04 DIAGNOSIS — R5381 Other malaise: Secondary | ICD-10-CM | POA: Diagnosis present

## 2012-04-04 DIAGNOSIS — Z9849 Cataract extraction status, unspecified eye: Secondary | ICD-10-CM

## 2012-04-04 DIAGNOSIS — B9789 Other viral agents as the cause of diseases classified elsewhere: Secondary | ICD-10-CM | POA: Diagnosis present

## 2012-04-04 DIAGNOSIS — L0291 Cutaneous abscess, unspecified: Secondary | ICD-10-CM

## 2012-04-04 DIAGNOSIS — D709 Neutropenia, unspecified: Secondary | ICD-10-CM | POA: Diagnosis present

## 2012-04-04 DIAGNOSIS — N189 Chronic kidney disease, unspecified: Secondary | ICD-10-CM

## 2012-04-04 DIAGNOSIS — E1165 Type 2 diabetes mellitus with hyperglycemia: Secondary | ICD-10-CM

## 2012-04-04 DIAGNOSIS — L02419 Cutaneous abscess of limb, unspecified: Secondary | ICD-10-CM | POA: Diagnosis present

## 2012-04-04 DIAGNOSIS — E7800 Pure hypercholesterolemia, unspecified: Secondary | ICD-10-CM | POA: Diagnosis present

## 2012-04-04 DIAGNOSIS — H8110 Benign paroxysmal vertigo, unspecified ear: Secondary | ICD-10-CM

## 2012-04-04 DIAGNOSIS — M129 Arthropathy, unspecified: Secondary | ICD-10-CM | POA: Diagnosis present

## 2012-04-04 DIAGNOSIS — Z7982 Long term (current) use of aspirin: Secondary | ICD-10-CM

## 2012-04-04 DIAGNOSIS — E876 Hypokalemia: Secondary | ICD-10-CM | POA: Diagnosis present

## 2012-04-04 DIAGNOSIS — R531 Weakness: Secondary | ICD-10-CM | POA: Diagnosis present

## 2012-04-04 DIAGNOSIS — K1370 Unspecified lesions of oral mucosa: Secondary | ICD-10-CM | POA: Diagnosis present

## 2012-04-04 DIAGNOSIS — N19 Unspecified kidney failure: Secondary | ICD-10-CM | POA: Diagnosis present

## 2012-04-04 DIAGNOSIS — D61818 Other pancytopenia: Secondary | ICD-10-CM

## 2012-04-04 DIAGNOSIS — D61810 Antineoplastic chemotherapy induced pancytopenia: Secondary | ICD-10-CM | POA: Diagnosis present

## 2012-04-04 DIAGNOSIS — Z794 Long term (current) use of insulin: Secondary | ICD-10-CM

## 2012-04-04 DIAGNOSIS — N183 Chronic kidney disease, stage 3 unspecified: Secondary | ICD-10-CM | POA: Diagnosis present

## 2012-04-04 DIAGNOSIS — E119 Type 2 diabetes mellitus without complications: Secondary | ICD-10-CM | POA: Diagnosis present

## 2012-04-04 DIAGNOSIS — T451X5A Adverse effect of antineoplastic and immunosuppressive drugs, initial encounter: Secondary | ICD-10-CM | POA: Diagnosis present

## 2012-04-04 HISTORY — DX: Anemia, unspecified: D64.9

## 2012-04-04 HISTORY — DX: Disorder of kidney and ureter, unspecified: N28.9

## 2012-04-04 HISTORY — DX: Unspecified nephritic syndrome with unspecified morphologic changes: N05.9

## 2012-04-04 HISTORY — DX: Chronic kidney disease, unspecified: N18.9

## 2012-04-04 LAB — BASIC METABOLIC PANEL
BUN: 29 mg/dL — ABNORMAL HIGH (ref 6–23)
Calcium: 8.1 mg/dL — ABNORMAL LOW (ref 8.4–10.5)
Creatinine, Ser: 1.78 mg/dL — ABNORMAL HIGH (ref 0.50–1.35)
GFR calc Af Amer: 41 mL/min — ABNORMAL LOW (ref >90)
GFR calc non Af Amer: 35 mL/min — ABNORMAL LOW (ref >90)
Glucose, Bld: 311 mg/dL — ABNORMAL HIGH (ref 70–99)

## 2012-04-04 LAB — DIFFERENTIAL
Band Neutrophils: 6 % (ref 0–10)
Eosinophils Absolute: 0 10*3/uL (ref 0.0–0.7)
Eosinophils Relative: 2 % (ref 0–5)
Lymphs Abs: 0.6 10*3/uL — ABNORMAL LOW (ref 0.7–4.0)
Monocytes Absolute: 0.1 10*3/uL (ref 0.1–1.0)
Monocytes Relative: 10 % (ref 3–12)
Neutro Abs: 0.2 10*3/uL — ABNORMAL LOW (ref 1.7–7.7)
Neutrophils Relative %: 12 % — ABNORMAL LOW (ref 43–77)
nRBC: 0 /100 WBC

## 2012-04-04 LAB — URINALYSIS, ROUTINE W REFLEX MICROSCOPIC
Bilirubin Urine: NEGATIVE
Hgb urine dipstick: NEGATIVE
Ketones, ur: NEGATIVE mg/dL
Protein, ur: 30 mg/dL — AB
Specific Gravity, Urine: 1.013 (ref 1.005–1.030)
Urobilinogen, UA: 1 mg/dL (ref 0.0–1.0)

## 2012-04-04 LAB — GLUCOSE, CAPILLARY: Glucose-Capillary: 412 mg/dL — ABNORMAL HIGH (ref 70–99)

## 2012-04-04 LAB — CBC
Hemoglobin: 10 g/dL — ABNORMAL LOW (ref 13.0–17.0)
MCV: 87.9 fL (ref 78.0–100.0)
Platelets: 121 10*3/uL — ABNORMAL LOW (ref 150–400)
Platelets: 111 10*3/uL — ABNORMAL LOW (ref 150–400)
RBC: 3.38 MIL/uL — ABNORMAL LOW (ref 4.22–5.81)
RDW: 17.6 % — ABNORMAL HIGH (ref 11.5–15.5)
WBC: 0.9 10*3/uL — CL (ref 4.0–10.5)
WBC: 0.7 10*3/uL — CL (ref 4.0–10.5)

## 2012-04-04 LAB — CARDIAC PANEL(CRET KIN+CKTOT+MB+TROPI): Relative Index: INVALID (ref 0.0–2.5)

## 2012-04-04 LAB — CREATININE, SERUM: GFR calc Af Amer: 41 mL/min — ABNORMAL LOW (ref >90)

## 2012-04-04 LAB — URINE MICROSCOPIC-ADD ON

## 2012-04-04 MED ORDER — ACETAMINOPHEN 325 MG PO TABS
650.0000 mg | ORAL_TABLET | Freq: Once | ORAL | Status: AC
  Administered 2012-04-04: 650 mg via ORAL
  Filled 2012-04-04: qty 2

## 2012-04-04 MED ORDER — FUROSEMIDE 40 MG PO TABS
40.0000 mg | ORAL_TABLET | Freq: Every day | ORAL | Status: DC
  Administered 2012-04-05 – 2012-04-08 (×4): 40 mg via ORAL
  Filled 2012-04-04 (×4): qty 1

## 2012-04-04 MED ORDER — PREDNISONE 10 MG PO TABS
10.0000 mg | ORAL_TABLET | Freq: Every day | ORAL | Status: DC
  Administered 2012-04-04 – 2012-04-08 (×5): 10 mg via ORAL
  Filled 2012-04-04 (×5): qty 1

## 2012-04-04 MED ORDER — INSULIN ASPART 100 UNIT/ML ~~LOC~~ SOLN
5.0000 [IU] | Freq: Once | SUBCUTANEOUS | Status: AC
  Administered 2012-04-04: 5 [IU] via SUBCUTANEOUS

## 2012-04-04 MED ORDER — TAMSULOSIN HCL 0.4 MG PO CAPS
0.4000 mg | ORAL_CAPSULE | Freq: Every day | ORAL | Status: DC
  Administered 2012-04-05 – 2012-04-08 (×4): 0.4 mg via ORAL
  Filled 2012-04-04 (×4): qty 1

## 2012-04-04 MED ORDER — ACETAMINOPHEN 325 MG PO TABS: 650.0000 mg | ORAL_TABLET | Freq: Four times a day (QID) | ORAL | Status: DC | PRN

## 2012-04-04 MED ORDER — ALLOPURINOL 100 MG PO TABS
100.0000 mg | ORAL_TABLET | Freq: Every day | ORAL | Status: DC
  Administered 2012-04-05 – 2012-04-08 (×4): 100 mg via ORAL
  Filled 2012-04-04 (×4): qty 1

## 2012-04-04 MED ORDER — ASPIRIN 81 MG PO TBEC: 81.0000 mg | DELAYED_RELEASE_TABLET | Freq: Every day | ORAL | Status: DC

## 2012-04-04 MED ORDER — VANCOMYCIN HCL 1000 MG IV SOLR
1500.0000 mg | INTRAVENOUS | Status: DC
  Administered 2012-04-05 – 2012-04-07 (×3): 1500 mg via INTRAVENOUS
  Filled 2012-04-04 (×4): qty 1500

## 2012-04-04 MED ORDER — VANCOMYCIN HCL IN DEXTROSE 1-5 GM/200ML-% IV SOLN
1000.0000 mg | Freq: Once | INTRAVENOUS | Status: AC
  Administered 2012-04-04: 1000 mg via INTRAVENOUS
  Filled 2012-04-04: qty 200

## 2012-04-04 MED ORDER — INSULIN GLARGINE 100 UNIT/ML ~~LOC~~ SOLN
10.0000 [IU] | Freq: Every day | SUBCUTANEOUS | Status: DC
  Administered 2012-04-04: 10 [IU] via SUBCUTANEOUS

## 2012-04-04 MED ORDER — ASPIRIN EC 81 MG PO TBEC
81.0000 mg | DELAYED_RELEASE_TABLET | Freq: Every day | ORAL | Status: DC
  Administered 2012-04-04 – 2012-04-08 (×5): 81 mg via ORAL
  Filled 2012-04-04 (×5): qty 1

## 2012-04-04 MED ORDER — VANCOMYCIN HCL 1000 MG IV SOLR
2000.0000 mg | INTRAVENOUS | Status: DC
  Filled 2012-04-04: qty 2000

## 2012-04-04 MED ORDER — DUTASTERIDE 0.5 MG PO CAPS
0.5000 mg | ORAL_CAPSULE | Freq: Every day | ORAL | Status: DC
  Administered 2012-04-05 – 2012-04-08 (×4): 0.5 mg via ORAL
  Filled 2012-04-04 (×4): qty 1

## 2012-04-04 MED ORDER — AMLODIPINE BESYLATE 10 MG PO TABS
10.0000 mg | ORAL_TABLET | Freq: Every day | ORAL | Status: DC
  Administered 2012-04-05 – 2012-04-08 (×4): 10 mg via ORAL
  Filled 2012-04-04 (×4): qty 1

## 2012-04-04 MED ORDER — FUROSEMIDE 40 MG PO TABS
40.0000 mg | ORAL_TABLET | Freq: Every day | ORAL | Status: DC
  Filled 2012-04-04: qty 1

## 2012-04-04 MED ORDER — ACETAMINOPHEN 650 MG RE SUPP: 650.0000 mg | Freq: Four times a day (QID) | RECTAL | Status: DC | PRN

## 2012-04-04 MED ORDER — CARVEDILOL 12.5 MG PO TABS
12.5000 mg | ORAL_TABLET | Freq: Two times a day (BID) | ORAL | Status: DC
  Administered 2012-04-05 – 2012-04-08 (×7): 12.5 mg via ORAL
  Filled 2012-04-04 (×9): qty 1

## 2012-04-04 MED ORDER — SENNOSIDES-DOCUSATE SODIUM 8.6-50 MG PO TABS: 1.0000 | ORAL_TABLET | Freq: Every evening | ORAL | Status: DC | PRN

## 2012-04-04 MED ORDER — POTASSIUM CHLORIDE IN NACL 40-0.9 MEQ/L-% IV SOLN
INTRAVENOUS | Status: DC
  Administered 2012-04-04 – 2012-04-06 (×2): via INTRAVENOUS
  Filled 2012-04-04 (×8): qty 1000

## 2012-04-04 MED ORDER — INSULIN ASPART 100 UNIT/ML ~~LOC~~ SOLN
0.0000 [IU] | Freq: Three times a day (TID) | SUBCUTANEOUS | Status: DC
  Administered 2012-04-05 (×2): 5 [IU] via SUBCUTANEOUS
  Administered 2012-04-05: 9 [IU] via SUBCUTANEOUS
  Administered 2012-04-06 (×2): 3 [IU] via SUBCUTANEOUS

## 2012-04-04 MED ORDER — HEPARIN SODIUM (PORCINE) 5000 UNIT/ML IJ SOLN
5000.0000 [IU] | Freq: Three times a day (TID) | INTRAMUSCULAR | Status: DC
  Administered 2012-04-04 – 2012-04-08 (×10): 5000 [IU] via SUBCUTANEOUS
  Filled 2012-04-04 (×14): qty 1

## 2012-04-04 MED ORDER — SEVELAMER CARBONATE 800 MG PO TABS
800.0000 mg | ORAL_TABLET | Freq: Three times a day (TID) | ORAL | Status: DC
  Administered 2012-04-05 – 2012-04-08 (×11): 800 mg via ORAL
  Filled 2012-04-04 (×13): qty 1

## 2012-04-04 MED ORDER — HYDRALAZINE HCL 50 MG PO TABS
100.0000 mg | ORAL_TABLET | Freq: Two times a day (BID) | ORAL | Status: DC
  Administered 2012-04-05: 100 mg via ORAL
  Filled 2012-04-04 (×3): qty 2

## 2012-04-04 MED ORDER — VANCOMYCIN HCL IN DEXTROSE 1-5 GM/200ML-% IV SOLN
1000.0000 mg | INTRAVENOUS | Status: AC
  Administered 2012-04-04: 1000 mg via INTRAVENOUS
  Filled 2012-04-04: qty 200

## 2012-04-04 NOTE — ED Notes (Signed)
WBC 0.9 called from lab and reported to dr. Christy Gentles.  Pt placed on neutropenic precautions

## 2012-04-04 NOTE — ED Notes (Signed)
Pt resting at this time with eyes closed.  Wife remains at bedside.

## 2012-04-04 NOTE — ED Notes (Addendum)
Per EMS, pt was at his PCP yesterday and given abx for infection under right armpit.  Pt still not feeling well so came to ED.  Pt c/o weakness.  Per EMS, VSS and CBG 269.  Pt is renal failure, but is not a dialysis pt.

## 2012-04-04 NOTE — ED Notes (Signed)
Pt requesting something to eat.  St's he needs to take his kidney meds.

## 2012-04-04 NOTE — ED Notes (Signed)
Pt sleeping. Wife at bedside .

## 2012-04-04 NOTE — H&P (Addendum)
History and Physical Examination  Date: 04/04/2012  Patient name: Ryan Jimenez Medical record number: [id number] Date of birth: 02-02-1932 Age: 78 y.o. Gender: male PCP: Philis Fendt, MD, MD  Chief Complaint:  Chief Complaint  Patient presents with  . Weakness     History of Present Illness: Ryan Jimenez is an 78 yo AA male with a known history of DM, Hyperlipidemia, Gout, and difficult to control HTN who presented to his PCP on 11/16/11 for further evaluation of intermittent and progressively worsening syncope, dizziness associated with blurred vision, nausea and vomiting and was found to have an elevated creatinine of 2.63 and baseline of 1.0. He was admitted for further evaluation and treatment of Acute Renal Failure. Renal consult requested and he was seen by Dr. Elmarie Shiley in consultation on 11/24/11. He had an unsuccesful attempt for renal biopsy on 11/30/11 and developed a perinephric hematoma with associated hematuria, which has now cleared. 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 On 12/02/11 he was started on Cytoxan and following placement of a tunneled dialysis catheter, he underwent his first hemodialysis and plasmapheresis exchange. He completed two weeks of successful plasmapheresis exchanges (a total of 6) and last occuring on 12/16/11. He had temporary dialysis for about one week.   The patient has continued to take Cytoxan therapy and seems to have tolerated it well.  He's been getting EPO injections weekly.  He has been followed close by nephrology.  He reports that couple of days ago he went to see his primary care provider and was started on amoxicillin for an abscess under the right axilla.  The patient reports  that for the past several days he's been progressively getting weaker.  He reports that he's had some low-grade fever as well.  Emergency department he was evaluated and found to have a temperature 100.4.  He was found to be neutropenic as well.  He has an area under the right axillary area that's tender to palpation no fluctuant abscess noted.  He had a negative chest x-ray.  His urinalysis was unremarkable.  His renal function has markedly improved with a creatinine of 1.7.  However given his current fatigue weakness and fever with neutropenia and current chemotherapy treatment a hospitalization was requested for IV antibiotics and further monitoring.  The patient denies chest pain and shortness of breath.  He is more concerned about the weakness and physical deconditioning that he's been experiencing recently.  He denies swelling and pain in the legs.  He reports that he continues to be mobile although he has slowed down more recently.   Past Medical History Past Medical History  Diagnosis Date  . Hypertension   . Diabetes mellitus   . Hypercholesterolemia   . Gout   . Shortness of breath on exertion   . Bronchitis   . Arthritis   . Near syncope 10/2011; 11/2011  . Renal disorder   . CKD (chronic kidney disease)   . Glomerulonephritis   . Pancytopenia   . Anemia     Past Surgical History Past Surgical History  Procedure Date  . Hydrocele excision   . Cataract extraction w/ intraocular lens implant ~ 2009    right    Home Meds: Prior to Admission medications   Medication Sig Start  Date End Date Taking? Authorizing Provider  allopurinol (ZYLOPRIM) 100 MG tablet Take 100 mg by mouth daily.    Yes Historical Provider, MD  amLODipine (NORVASC) 10 MG tablet Take 1 tablet (10 mg total) by mouth daily. 12/17/11 12/16/12 Yes Luvenia Heller, FNP  amoxicillin (AMOXIL) 500 MG tablet Take 500 mg by mouth 3 (three) times daily. Filled on 03-30-12 for infection   Yes Historical Provider, MD  aspirin 81  MG EC tablet Take 81 mg by mouth daily. Swallow whole.   Yes Historical Provider, MD  carvedilol (COREG) 12.5 MG tablet Take 12.5 mg by mouth 2 (two) times daily with a meal.   Yes Historical Provider, MD  colchicine 0.6 MG tablet Take 0.6 mg by mouth 2 (two) times daily as needed. For gout   Yes Historical Provider, MD  cyclophosphamide (CYTOXAN) 50 MG tablet Take 200 mg by mouth daily. Give on an empty stomach 1 hour before or 2 hours after meals.   Yes Historical Provider, MD  dutasteride (AVODART) 0.5 MG capsule Take 0.5 mg by mouth daily.    Yes Historical Provider, MD  furosemide (LASIX) 40 MG tablet Take 40 mg by mouth daily.   Yes Historical Provider, MD  hydrALAZINE (APRESOLINE) 100 MG tablet Take 1 tablet (100 mg total) by mouth 2 (two) times daily. 12/17/11 12/16/12 Yes Luvenia Heller, FNP  insulin aspart (NOVOLOG) 100 UNIT/ML injection Inject 3-12 Units into the skin 3 (three) times daily before meals. Sliding scale   Yes Historical Provider, MD  insulin glargine (LANTUS) 100 UNIT/ML injection Inject 10 Units into the skin at bedtime.   Yes Historical Provider, MD  predniSONE (DELTASONE) 10 MG tablet Take 10 mg by mouth daily.   Yes Historical Provider, MD  sevelamer (RENVELA) 800 MG tablet Take 1 tablet (800 mg total) by mouth 3 (three) times daily with meals. 12/17/11 12/16/12 Yes Luvenia Heller, FNP  Tamsulosin HCl (FLOMAX) 0.4 MG CAPS Take 0.4 mg by mouth daily.     Yes Historical Provider, MD  Vitamin D, Ergocalciferol, (DRISDOL) 50000 UNITS CAPS Take 50,000 Units by mouth every 7 (seven) days. Take on sundays   Yes Historical Provider, MD    Allergies: Review of patient's allergies indicates no known allergies.  Social History:  History   Social History  . Marital Status: Married    Spouse Name: N/A    Number of Children: N/A  . Years of Education: N/A   Occupational History  . Not on file.   Social History Main Topics  . Smoking status: Former Smoker -- 1.0 packs/day for 40 years      Types: Cigarettes  . Smokeless tobacco: Never Used  . Alcohol Use: No     11/23/11 "couple ounces q month maybe"  . Drug Use: No  . Sexually Active: Not Currently   Other Topics Concern  . Not on file   Social History Narrative  . No narrative on file   Family History:  Family History  Problem Relation Age of Onset  . Hypertension Mother   . Heart disease Mother   . Diabetes Sister     Review of Systems: Pertinent items are noted in HPI. All other systems reviewed and reported as negative.   Physical Exam: Blood pressure 107/58, pulse 64, temperature 100.4 F (38 C), temperature source Oral, resp. rate 25, SpO2 95.00%. Gen.-chronically ill-appearing male, awake alert cooperative and pleasant HEENT normal cephalic atraumatic, mucous membranes moist, pale mucosa Neck supple, no JVD Lungs-bilateral  breath sounds clear auscultation CV-normal S1-S2 sounds without murmurs rubs or gallops Abdomen-obese, soft nondistended nontender no masses palpated Extremity-tenderness under the right axilla no fluctuant abscess noted, multiple skin tags noted, mild pretibial edema bilateral, no clubbing or cyanosis noted Neuro-nonfocal exam  Lab  And Imaging results:  Results for orders placed during the hospital encounter of 04/04/12 (from the past 24 hour(s))  CBC     Status: Abnormal   Collection Time   04/04/12  1:41 PM      Component Value Range   WBC 0.9 (*) 4.0 - 10.5 (K/uL)   RBC 3.38 (*) 4.22 - 5.81 (MIL/uL)   Hemoglobin 10.0 (*) 13.0 - 17.0 (g/dL)   HCT 29.4 (*) 39.0 - 52.0 (%)   MCV 87.0  78.0 - 100.0 (fL)   MCH 29.6  26.0 - 34.0 (pg)   MCHC 34.0  30.0 - 36.0 (g/dL)   RDW 17.6 (*) 11.5 - 15.5 (%)   Platelets 121 (*) 150 - 400 (K/uL)  DIFFERENTIAL     Status: Abnormal   Collection Time   04/04/12  1:41 PM      Component Value Range   Neutrophils Relative 12 (*) 43 - 77 (%)   Lymphocytes Relative 70 (*) 12 - 46 (%)   Monocytes Relative 10  3 - 12 (%)   Eosinophils  Relative 2  0 - 5 (%)   Basophils Relative 0  0 - 1 (%)   Band Neutrophils 6  0 - 10 (%)   Metamyelocytes Relative 0     Myelocytes 0     Promyelocytes Absolute 0     Blasts 0     nRBC 0  0 (/100 WBC)   Neutro Abs 0.2 (*) 1.7 - 7.7 (K/uL)   Lymphs Abs 0.6 (*) 0.7 - 4.0 (K/uL)   Monocytes Absolute 0.1  0.1 - 1.0 (K/uL)   Eosinophils Absolute 0.0  0.0 - 0.7 (K/uL)   Basophils Absolute 0.0  0.0 - 0.1 (K/uL)   RBC Morphology ELLIPTOCYTES    BASIC METABOLIC PANEL     Status: Abnormal   Collection Time   04/04/12  1:41 PM      Component Value Range   Sodium 131 (*) 135 - 145 (mEq/L)   Potassium 3.0 (*) 3.5 - 5.1 (mEq/L)   Chloride 96  96 - 112 (mEq/L)   CO2 21  19 - 32 (mEq/L)   Glucose, Bld 311 (*) 70 - 99 (mg/dL)   BUN 29 (*) 6 - 23 (mg/dL)   Creatinine, Ser 1.78 (*) 0.50 - 1.35 (mg/dL)   Calcium 8.1 (*) 8.4 - 10.5 (mg/dL)   GFR calc non Af Amer 35 (*) >90 (mL/min)   GFR calc Af Amer 41 (*) >90 (mL/min)  LACTIC ACID, PLASMA     Status: Normal   Collection Time   04/04/12  3:28 PM      Component Value Range   Lactic Acid, Venous 1.5  0.5 - 2.2 (mmol/L)  URINALYSIS, ROUTINE W REFLEX MICROSCOPIC     Status: Abnormal   Collection Time   04/04/12  4:26 PM      Component Value Range   Color, Urine YELLOW  YELLOW    APPearance CLEAR  CLEAR    Specific Gravity, Urine 1.013  1.005 - 1.030    pH 5.5  5.0 - 8.0    Glucose, UA NEGATIVE  NEGATIVE (mg/dL)   Hgb urine dipstick NEGATIVE  NEGATIVE    Bilirubin Urine NEGATIVE  NEGATIVE    Ketones, ur NEGATIVE  NEGATIVE (mg/dL)   Protein, ur 30 (*) NEGATIVE (mg/dL)   Urobilinogen, UA 1.0  0.0 - 1.0 (mg/dL)   Nitrite NEGATIVE  NEGATIVE    Leukocytes, UA NEGATIVE  NEGATIVE   URINE MICROSCOPIC-ADD ON     Status: Abnormal   Collection Time   04/04/12  4:26 PM      Component Value Range   Squamous Epithelial / LPF FEW (*) RARE    WBC, UA 0-2  <3 (WBC/hpf)   RBC / HPF 0-2  <3 (RBC/hpf)   Bacteria, UA RARE  RARE    Casts HYALINE CASTS (*)  NEGATIVE    ANC-0.2   Impression   Abscess of axillary region  Hypercholesterolemia  Hypertension  Renal failure  Diabetes mellitus type II  Neutropenia  Pancytopenia due to chemotherapy  Anemia  Generalized weakness  Physical deconditioning  Hypokalemia  Plan  The patient is pancytopenic most likely related to the Cytoxan therapy, his renal function has improved markedly since February.  The patient's low-grade temperature is most likely related to the axillary infection, will treat with broad-spectrum IV antibiotics, monitor white blood cell count, neutropenic precautions recommended, consult heme, consult nephrology, continue prednisone, holding Cytoxan until nephrology has evaluated, continue erythropoietin injections, consult PT OT, monitor blood glucose provide supplemental insulin as needed, please see orders. Replace potassium, check magnesium.    [place], [place] 04/04/2012, 6:25 PM

## 2012-04-04 NOTE — ED Provider Notes (Signed)
History     CSN: [id number]  Arrival date & time 04/04/12  1307   First MD Initiated Contact with Patient 04/04/12 1323      Chief Complaint  Patient presents with  . Weakness    Patient is a 78 y.o. male presenting with weakness. The history is provided by the patient.  Weakness The primary symptoms include dizziness, fever and nausea. Primary symptoms do not include headaches, syncope, loss of consciousness, altered mental status, focal weakness or vomiting. Episode onset: several days ago. The symptoms are worsening. The neurological symptoms are diffuse.  Dizziness also occurs with nausea and weakness. Dizziness does not occur with vomiting.   Additional symptoms include weakness.  exertion worsens his symptoms Rest improves his symptoms   Pt reports fatigue for several days.  No focal weakness.  No cp.  No sob.  He has had fever but no vomiting, no diarrhea No abdominal pain, no back pain He has h/o renal failure but not on dialysis Reports seen for "infection" in his axilla and just started abx for this  Past Medical History  Diagnosis Date  . Hypertension   . Diabetes mellitus   . Hypercholesterolemia   . Gout   . Shortness of breath on exertion   . Bronchitis   . Arthritis   . Near syncope 10/2011; 11/2011  . Renal disorder     Past Surgical History  Procedure Date  . Hydrocele excision   . Cataract extraction w/ intraocular lens implant ~ 2009    right    Family History  Problem Relation Age of Onset  . Hypertension Mother   . Heart disease Mother   . Diabetes Sister     History  Substance Use Topics  . Smoking status: Former Smoker -- 1.0 packs/day for 40 years    Types: Cigarettes  . Smokeless tobacco: Never Used  . Alcohol Use: No     11/23/11 "couple ounces q month maybe"      Review of Systems  Constitutional: Positive for fever.  Cardiovascular: Negative for syncope.  Gastrointestinal: Positive for nausea. Negative for vomiting.    Neurological: Positive for dizziness and weakness. Negative for focal weakness, loss of consciousness and headaches.  Psychiatric/Behavioral: Negative for altered mental status.  All other systems reviewed and are negative.    Allergies  Review of patient's allergies indicates no known allergies.  Home Medications   Current Outpatient Rx  Name Route Sig Dispense Refill  . ALLOPURINOL 100 MG PO TABS Oral Take 100 mg by mouth daily.     [name] AMLODIPINE BESYLATE 10 MG PO TABS Oral Take 1 tablet (10 mg total) by mouth daily. 30 tablet 3  . AMOXICILLIN 500 MG PO TABS Oral Take 500 mg by mouth 3 (three) times daily. Filled on 03-30-12 for infection    . ASPIRIN 81 MG PO TBEC Oral Take 81 mg by mouth daily. Swallow whole.    [name] CARVEDILOL 12.5 MG PO TABS Oral Take 12.5 mg by mouth 2 (two) times daily with a meal.    . COLCHICINE 0.6 MG PO TABS Oral Take 0.6 mg by mouth 2 (two) times daily as needed. For gout    . CYCLOPHOSPHAMIDE 50 MG PO TABS Oral Take 200 mg by mouth daily. Give on an empty stomach 1 hour before or 2 hours after meals.    . DUTASTERIDE 0.5 MG PO CAPS Oral Take 0.5 mg by mouth daily.     . FUROSEMIDE 40 MG PO TABS  Oral Take 40 mg by mouth daily.    [name] HYDRALAZINE HCL 100 MG PO TABS Oral Take 1 tablet (100 mg total) by mouth 2 (two) times daily. 60 tablet 3  . INSULIN ASPART 100 UNIT/ML Ryan Jimenez SOLN Subcutaneous Inject 3-12 Units into the skin 3 (three) times daily before meals. Sliding scale    . INSULIN GLARGINE 100 UNIT/ML Ryan Jimenez SOLN Subcutaneous Inject 10 Units into the skin at bedtime.    [name] PREDNISONE 10 MG PO TABS Oral Take 10 mg by mouth daily.    [name] SEVELAMER CARBONATE 800 MG PO TABS Oral Take 1 tablet (800 mg total) by mouth 3 (three) times daily with meals. 90 tablet 3  . TAMSULOSIN HCL 0.4 MG PO CAPS Oral Take 0.4 mg by mouth daily.      [name] VITAMIN D (ERGOCALCIFEROL) 50000 UNITS PO CAPS Oral Take 50,000 Units by mouth every 7 (seven) days. Take on sundays      BP 104/54  Pulse  82  Temp(Src) 100.4 F (38 C) (Oral)  Resp 30  SpO2 98% BP 107/58  Pulse 64  Temp(Src) 100.4 F (38 C) (Oral)  Resp 25  SpO2 95%   Physical Exam CONSTITUTIONAL: Well developed/well nourished HEAD AND FACE: Normocephalic/atraumatic EYES: EOMI/PERRL ENMT: Mucous membranes moist NECK: supple no meningeal signs SPINE:entire spine nontender CV: S1/S2 noted, no murmurs/rubs/gallops noted LUNGS: rales right base, but no distress, speaks to me comfortably ABDOMEN: soft, nontender, no rebound or guarding GU:no cva tenderness NEURO: Pt is awake/alert, moves all extremitiesx4, no arm or leg drift is noted EXTREMITIES: pulses normal, full ROM SKIN: warm, color normal.  Has scattered small abscesses to his right axilla but no large abscess noted, no significant fluctuance noted.   PSYCH: no abnormalities of mood noted  ED Course  Procedures    Labs Reviewed  CBC  DIFFERENTIAL  BASIC METABOLIC PANEL  URINALYSIS, ROUTINE W REFLEX MICROSCOPIC   2:17 PM Pt with generalized fatigue Stable at this time Labs/imaging pending  4:33 PM Leuko/neutropenia noted Pt receives EPO injections frequently, but denies known cancer He is not septic appearing Source of fever could be abscess to axilla Will admit D/w dr Ryan Jimenez, [facility]   MDM  Nursing notes reviewed and considered in documentation labs/vitals reviewed and considered xrays reviewed and considered Previous records reviewed and considered        Date: 04/04/2012  Rate: 79  Rhythm: normal sinus rhythm  QRS Axis: normal  Intervals: normal  ST/T Wave abnormalities: nonspecific ST changes  Conduction Disutrbances:right bundle branch block  Narrative Interpretation:   Old EKG Reviewed: unchanged    Ryan Cable, MD 04/04/12 [phone]

## 2012-04-04 NOTE — ED Notes (Signed)
Food tray ordered for pt per Dr.Wickline.  Also  St's pt can take his kidney med from home.

## 2012-04-04 NOTE — Progress Notes (Addendum)
ANTIBIOTIC CONSULT NOTE - INITIAL  Pharmacy Consult for vancomycin Indication: right axilla abscess  No Known Allergies  Patient Measurements: weight= 101 kg, height = 5'9" (per patient)     Vital Signs: Temp: 100.4 F (38 C) (05/22 1317) Temp src: Oral (05/22 1317) BP: 107/58 mmHg (05/22 1554) Pulse Rate: 64  (05/22 1554) Intake/Output from previous day:   Intake/Output from this shift:    Labs:  Basename 04/04/12 1341  WBC 0.9*  HGB 10.0*  PLT 121*  LABCREA --  CREATININE 1.78*   The CrCl is unknown because both a height and weight (above a minimum accepted value) are required for this calculation. No results found for this basename: VANCOTROUGH:2,VANCOPEAK:2,VANCORANDOM:2,GENTTROUGH:2,GENTPEAK:2,GENTRANDOM:2,TOBRATROUGH:2,TOBRAPEAK:2,TOBRARND:2,AMIKACINPEAK:2,AMIKACINTROU:2,AMIKACIN:2, in the last 72 hours   Microbiology: No results found for this or any previous visit (from the past 720 hour(s)).  Medical History: Past Medical History  Diagnosis Date  . Hypertension   . Diabetes mellitus   . Hypercholesterolemia   . Gout   . Shortness of breath on exertion   . Bronchitis   . Arthritis   . Near syncope 10/2011; 11/2011  . Renal disorder   . CKD (chronic kidney disease)   . Glomerulonephritis   . Pancytopenia   . Anemia     Medications:  See med rec  Assessment: Patient is a 78 y.o M with ANCA+ renal vasculitis with cytoxan started on 12/02/11 and placement of tunneled dialysis catheter.  Patient stated that the last time he had dialysis was back in February of this year when he was hospitalized.  He's currently on cytoxan 200mg  daily.  He was started on amoxicillin 500mg  TID yesterday for right axilla abscess.  Admitted to the ED today with c/o of weakness and found to be febrile. Scr 1.78 (est. Crcl~ 35).  Patient received vancomycin 1gm in the ED at 1633.  Goal of Therapy:  Vancomycin trough level 10-15 mcg/ml  Plan:  1) Will give an additional  vancomycin 1gm x1 now to get 2gm total for today, then vancomycin 1500mg  Iv q24h 2) Will check level at steady state  Kashawn Dirr P 04/04/2012,6:55 PM

## 2012-04-05 ENCOUNTER — Inpatient Hospital Stay (HOSPITAL_COMMUNITY): Payer: Medicare Other

## 2012-04-05 ENCOUNTER — Encounter (HOSPITAL_COMMUNITY): Payer: Self-pay | Admitting: Radiology

## 2012-04-05 ENCOUNTER — Inpatient Hospital Stay (HOSPITAL_COMMUNITY): Admission: RE | Admit: 2012-04-05 | Payer: PRIVATE HEALTH INSURANCE | Source: Ambulatory Visit

## 2012-04-05 DIAGNOSIS — E1165 Type 2 diabetes mellitus with hyperglycemia: Secondary | ICD-10-CM

## 2012-04-05 DIAGNOSIS — N189 Chronic kidney disease, unspecified: Secondary | ICD-10-CM

## 2012-04-05 DIAGNOSIS — L732 Hidradenitis suppurativa: Secondary | ICD-10-CM | POA: Diagnosis present

## 2012-04-05 DIAGNOSIS — R5081 Fever presenting with conditions classified elsewhere: Secondary | ICD-10-CM

## 2012-04-05 DIAGNOSIS — D61818 Other pancytopenia: Secondary | ICD-10-CM

## 2012-04-05 DIAGNOSIS — D709 Neutropenia, unspecified: Secondary | ICD-10-CM

## 2012-04-05 LAB — COMPREHENSIVE METABOLIC PANEL
AST: 18 U/L (ref 0–37)
BUN: 29 mg/dL — ABNORMAL HIGH (ref 6–23)
CO2: 24 mEq/L (ref 19–32)
Chloride: 100 mEq/L (ref 96–112)
Creatinine, Ser: 1.63 mg/dL — ABNORMAL HIGH (ref 0.50–1.35)
GFR calc Af Amer: 45 mL/min — ABNORMAL LOW (ref >90)
GFR calc non Af Amer: 39 mL/min — ABNORMAL LOW (ref >90)
Glucose, Bld: 309 mg/dL — ABNORMAL HIGH (ref 70–99)
Total Bilirubin: 0.4 mg/dL (ref 0.3–1.2)

## 2012-04-05 LAB — CBC
MCH: 29.7 pg (ref 26.0–34.0)
Platelets: 121 10*3/uL — ABNORMAL LOW (ref 150–400)
RBC: 3.16 MIL/uL — ABNORMAL LOW (ref 4.22–5.81)

## 2012-04-05 LAB — GLUCOSE, CAPILLARY
Glucose-Capillary: 403 mg/dL — ABNORMAL HIGH (ref 70–99)
Glucose-Capillary: 267 mg/dL — ABNORMAL HIGH (ref 70–99)
Glucose-Capillary: 284 mg/dL — ABNORMAL HIGH (ref 70–99)
Glucose-Capillary: 316 mg/dL — ABNORMAL HIGH (ref 70–99)

## 2012-04-05 LAB — CARDIAC PANEL(CRET KIN+CKTOT+MB+TROPI)
CK, MB: 2.3 ng/mL (ref 0.3–4.0)
Relative Index: INVALID (ref 0.0–2.5)
Relative Index: INVALID (ref 0.0–2.5)
Total CK: 67 U/L (ref 7–232)
Total CK: 46 U/L (ref 7–232)
Troponin I: <0.3 ng/mL (ref <0.30)

## 2012-04-05 LAB — HEMOGLOBIN A1C: Mean Plasma Glucose: 166 mg/dL — ABNORMAL HIGH (ref <117)

## 2012-04-05 MED ORDER — INSULIN GLARGINE 100 UNIT/ML ~~LOC~~ SOLN
15.0000 [IU] | Freq: Every day | SUBCUTANEOUS | Status: DC
  Administered 2012-04-05: 15 [IU] via SUBCUTANEOUS

## 2012-04-05 MED ORDER — TAMSULOSIN HCL 0.4 MG PO CAPS: 0.4000 mg | ORAL_CAPSULE | Freq: Every day | ORAL | Status: DC

## 2012-04-05 MED ORDER — GLUCERNA SHAKE PO LIQD
237.0000 mL | ORAL | Status: DC
  Administered 2012-04-05 – 2012-04-07 (×3): 237 mL via ORAL

## 2012-04-05 MED ORDER — POTASSIUM CHLORIDE CRYS ER 20 MEQ PO TBCR
40.0000 meq | EXTENDED_RELEASE_TABLET | Freq: Four times a day (QID) | ORAL | Status: AC
  Administered 2012-04-05 – 2012-04-06 (×3): 40 meq via ORAL
  Filled 2012-04-05 (×3): qty 2

## 2012-04-05 MED ORDER — MEGESTROL ACETATE 40 MG/ML PO SUSP
400.0000 mg | Freq: Every day | ORAL | Status: DC
  Administered 2012-04-05 – 2012-04-08 (×4): 400 mg via ORAL
  Filled 2012-04-05 (×4): qty 10

## 2012-04-05 MED ORDER — CLINDAMYCIN PHOSPHATE 2 % VA CREA
1.0000 | TOPICAL_CREAM | Freq: Every day | VAGINAL | Status: DC
  Filled 2012-04-05: qty 40

## 2012-04-05 MED ORDER — POTASSIUM CHLORIDE CRYS ER 20 MEQ PO TBCR
40.0000 meq | EXTENDED_RELEASE_TABLET | Freq: Two times a day (BID) | ORAL | Status: DC
  Administered 2012-04-05: 40 meq via ORAL
  Filled 2012-04-05 (×2): qty 2

## 2012-04-05 MED ORDER — INSULIN ASPART 100 UNIT/ML ~~LOC~~ SOLN
5.0000 [IU] | Freq: Three times a day (TID) | SUBCUTANEOUS | Status: DC
  Administered 2012-04-05: 5 [IU] via SUBCUTANEOUS
  Administered 2012-04-05: 12:00:00 via SUBCUTANEOUS
  Administered 2012-04-06 (×2): 5 [IU] via SUBCUTANEOUS

## 2012-04-05 MED ORDER — MUPIROCIN CALCIUM 2 % EX CREA
TOPICAL_CREAM | Freq: Two times a day (BID) | CUTANEOUS | Status: DC
  Administered 2012-04-05: 10:00:00 via TOPICAL
  Filled 2012-04-05: qty 15

## 2012-04-05 MED ORDER — PIPERACILLIN-TAZOBACTAM 3.375 G IVPB
3.3750 g | Freq: Three times a day (TID) | INTRAVENOUS | Status: DC
  Administered 2012-04-05 – 2012-04-08 (×9): 3.375 g via INTRAVENOUS
  Filled 2012-04-05 (×13): qty 50

## 2012-04-05 MED ORDER — CLINDAMYCIN PHOSPHATE 1 % EX GEL
Freq: Every day | CUTANEOUS | Status: DC
  Administered 2012-04-05: 22:00:00 via TOPICAL
  Filled 2012-04-05 (×9): qty 30

## 2012-04-05 NOTE — Care Management Note (Signed)
    Page 1 of 1   04/05/2012     3:20:44 PM   CARE MANAGEMENT NOTE 04/05/2012  Patient:  RAJON, NEITZKE   Account Number:  [id number]  Date Initiated:  04/05/2012  Documentation initiated by:  Lars Pinks  Subjective/Objective Assessment:   PT ADMITTED WITH NEUTROPENIA     Action/Plan:   PROGRESSION OF CARE AND DISCHARGE PLANNING   Anticipated DC Date:  04/08/2012   Anticipated DC Plan:  [place]  CM consult      Choice offered to / List presented to:  C-1 Patient        [place] arranged  [place] [facility]      [place].   Status of service:  In process, will continue to follow Medicare Important Message given?   (If response is "NO", the following Medicare IM given date fields will be blank) Date Medicare IM given:   Date Additional Medicare IM given:    Discharge Disposition:    Per UR Regulation:  Reviewed for med. necessity/level of care/duration of stay  If discussed at [place] of Stay Meetings, dates discussed:    Comments:  04/05/12 Lars Pinks, RN, BSN 1519 PT [place].  PHYSICAL THERAPY RECOMMENDS [place]

## 2012-04-05 NOTE — Consult Note (Signed)
[facility] - CONSULT NOTE Resident Note    Please see below for attending addendum to resident note.   Date: 04/05/2012                  Patient Name:  Ryan Jimenez  MRN: [id number]  DOB: 1932/03/22  Age / Sex: 78 y.o., male         PCP: Philis Fendt, MD                 Referring Physician: Dr. Wynetta Emery                 Reason for Consult: Chronic Kidney disease            History of Present Illness:  78 year old man with past medical history significant for type 2 diabetes, hypertension, gout who presented to the ER on 04/04/2012 4 right armpit swelling and pain with fevers for last 3-4 days.  Hes that he started hurting in his right arm. On Sunday night about 5 days ago but didn't pay much attention. His pain continued to get worse and he looked his armpit the next morning when he found that he had large number of blisters that he describes as "chickenpox" associated with swelling. He states that he has never had such kind of an infection in the past. He  went to his primary care doctor and was started on antibiotics(likely amoxicillin). His symptoms were associated with fever. He took his temperature at home and reports fevers to be around 102-103F. He also endorses some weakness and malaise. Because his symptoms continued to get worse, he decided to come to the ER for further evaluation on 04/04/2012. In the ER he was found to be neutropenic with white count of 0.9.  Of note patient has been on Cytoxan and prednisone for his  renal failure secondary to lack of vasculitis since January of 2013 and was following up with Dr. Posey Pronto as outpatient. His last creatinine from about a week ago was 1.97( 5/8). His white count from about 4 weeks ago was 4.3.  He was admitted for acute renal failure back in January of 2013 when he presented with symptoms of progressively worsening syncope, dizziness associated with blurred vision, nausea and vomiting and was found to have an  elevated creatinine of 2.63 and baseline of 1.0. He was seen by Dr. Elmarie Shiley during that hospitalization.He had an unsuccesful attempt for renal biopsy on 11/30/11 and developed a perinephric hematoma with associated hematuria, which has now cleared. 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 On 12/02/11 he was started on Cytoxan and following placement of a tunneled dialysis catheter, he underwent his first hemodialysis and plasmapheresis exchange. He completed two weeks of successful plasmapheresis exchanges (a total of 6) and last occuring on 12/16/11. He had temporary dialysis for about one week.       Medications: Outpatient medications: Prescriptions prior to admission  Medication Sig Dispense Refill  . allopurinol (ZYLOPRIM) 100 MG tablet Take 100 mg by mouth daily.       [name] amLODipine (NORVASC) 10 MG tablet Take 1 tablet (10 mg total) by mouth daily.  30 tablet  3  . amoxicillin (AMOXIL) 500 MG tablet Take 500 mg by mouth 3 (three) times daily.  Filled on 03-30-12 for infection      . aspirin 81 MG EC tablet Take 81 mg by mouth daily. Swallow whole.      . carvedilol (COREG) 12.5 MG tablet Take 12.5 mg by mouth 2 (two) times daily with a meal.      . colchicine 0.6 MG tablet Take 0.6 mg by mouth 2 (two) times daily as needed. For gout      . cyclophosphamide (CYTOXAN) 50 MG tablet Take 200 mg by mouth daily. Give on an empty stomach 1 hour before or 2 hours after meals.      . dutasteride (AVODART) 0.5 MG capsule Take 0.5 mg by mouth daily.       . furosemide (LASIX) 40 MG tablet Take 40 mg by mouth daily.      . hydrALAZINE (APRESOLINE) 100 MG tablet Take 1 tablet (100 mg total) by mouth 2 (two) times daily.  60 tablet  3  .  HYDROcodone-homatropine (HYCODAN) 5-1.5 MG/5ML syrup Take 5 mLs by mouth every 6 (six) hours as needed. For cough      . insulin aspart (NOVOLOG FLEXPEN) 100 UNIT/ML injection Inject 2-10 Units into the skin 2 (two) times daily before a meal. Per SSI      . insulin glargine (LANTUS SOLOSTAR) 100 UNIT/ML injection Inject 10 Units into the skin at bedtime.      . megestrol (MEGACE) 40 MG/ML suspension Take 400 mg by mouth daily.      . predniSONE (DELTASONE) 10 MG tablet Take 10 mg by mouth daily.      . sevelamer (RENVELA) 800 MG tablet Take 1 tablet (800 mg total) by mouth 3 (three) times daily with meals.  90 tablet  3  . Tamsulosin HCl (FLOMAX) 0.4 MG CAPS Take 0.4 mg by mouth at bedtime.      . Vitamin D, Ergocalciferol, (DRISDOL) 50000 UNITS CAPS Take 50,000 Units by mouth every 7 (seven) days. Take on sundays        Current medications: Current Facility-Administered Medications  Medication Dose Route Frequency Provider Last Rate Last Dose  . 0.9 % NaCl with KCl 40 mEq / L  infusion   Intravenous Continuous Clanford Marisa Hua, MD 75 mL/hr at 04/04/12 2307    . acetaminophen (TYLENOL) tablet 650 mg  650 mg Oral Q6H PRN Clanford Marisa Hua, MD       Or  . acetaminophen (TYLENOL) suppository 650 mg  650 mg Rectal Q6H PRN Clanford L Johnson, MD      . acetaminophen (TYLENOL) tablet 650 mg  650 mg Oral Once Sharyon Cable, MD   650 mg at 04/04/12 1424  . allopurinol (ZYLOPRIM) tablet 100 mg  100 mg Oral Daily Clanford Marisa Hua, MD   100 mg at 04/05/12 0953  . amLODipine (NORVASC) tablet 10 mg  10 mg Oral Daily Clanford Marisa Hua, MD   10 mg at 04/05/12 0953  . aspirin EC tablet 81 mg  81 mg Oral Daily Clanford Marisa Hua, MD   81 mg at 04/05/12 0954  . carvedilol (COREG) tablet 12.5 mg  12.5 mg Oral BID WC Clanford Marisa Hua, MD   12.5 mg at 04/05/12 0817  . dutasteride (AVODART) capsule 0.5 mg  0.5 mg Oral Daily Clanford Marisa Hua, MD   0.5 mg at 04/05/12 0953  . feeding supplement (GLUCERNA  SHAKE) liquid 237 mL  237 mL Oral Q24H Heather Cornelison Pitts, RD      . furosemide (LASIX) tablet 40  mg  40 mg Oral Daily Clanford Marisa Hua, MD   40 mg at 04/05/12 0953  . heparin injection 5,000 Units  5,000 Units Subcutaneous Q8H Clanford L Johnson, MD   5,000 Units at 04/04/12 2122  . hydrALAZINE (APRESOLINE) tablet 100 mg  100 mg Oral BID Clanford Marisa Hua, MD   100 mg at 04/05/12 0953  . insulin aspart (novoLOG) injection 0-9 Units  0-9 Units Subcutaneous TID WC Clanford Marisa Hua, MD   5 Units at 04/05/12 1157  . insulin aspart (novoLOG) injection 5 Units  5 Units Subcutaneous Once [employer], Student-NP   5 Units at 04/04/12 2240  . insulin aspart (novoLOG) injection 5 Units  5 Units Subcutaneous TID WC Clanford L Johnson, MD      . insulin glargine (LANTUS) injection 15 Units  15 Units Subcutaneous QHS Clanford L Johnson, MD      . mupirocin cream (BACTROBAN) 2 %   Topical BID Clanford L Johnson, MD      . potassium chloride SA (K-DUR,KLOR-CON) CR tablet 40 mEq  40 mEq Oral BID Clanford Marisa Hua, MD   40 mEq at 04/05/12 1157  . predniSONE (DELTASONE) tablet 10 mg  10 mg Oral Daily Clanford Marisa Hua, MD   10 mg at 04/05/12 0953  . senna-docusate (Senokot-S) tablet 1 tablet  1 tablet Oral QHS PRN Clanford Marisa Hua, MD      . sevelamer (RENVELA) tablet 800 mg  800 mg Oral TID WC Clanford Marisa Hua, MD   800 mg at 04/05/12 1156  . Tamsulosin HCl (FLOMAX) capsule 0.4 mg  0.4 mg Oral Daily Clanford Marisa Hua, MD   0.4 mg at 04/05/12 0953  . vancomycin (VANCOCIN) 1,500 mg in sodium chloride 0.9 % 500 mL IVPB  1,500 mg Intravenous Q24H Anh P Pham, PHARMD      . vancomycin (VANCOCIN) IVPB 1000 mg/200 mL premix  1,000 mg Intravenous Once Sharyon Cable, MD   1,000 mg at 04/04/12 1633  . vancomycin (VANCOCIN) IVPB 1000 mg/200 mL premix  1,000 mg Intravenous NOW Anh P Pham, PHARMD   1,000 mg at 04/04/12 2125  . DISCONTD: aspirin EC tablet 81 mg  81 mg Oral Daily Clanford Marisa Hua, MD       . DISCONTD: furosemide (LASIX) tablet 40 mg  40 mg Oral Daily Clanford Marisa Hua, MD      . DISCONTD: insulin glargine (LANTUS) injection 10 Units  10 Units Subcutaneous QHS Clanford Marisa Hua, MD   10 Units at 04/04/12 2240  . DISCONTD: vancomycin (VANCOCIN) 2,000 mg in sodium chloride 0.9 % 500 mL IVPB  2,000 mg Intravenous To Major Anh P Pham, PHARMD          Allergies: No Known Allergies    Past Medical History: Past Medical History  Diagnosis Date  . Hypertension   . Diabetes mellitus   . Hypercholesterolemia   . Gout   . Shortness of breath on exertion   . Bronchitis   . Arthritis   . Near syncope 10/2011; 11/2011  . Renal disorder   . CKD (chronic kidney disease)   . Glomerulonephritis   . Pancytopenia   . Anemia      Past Surgical History: Past Surgical History  Procedure Date  . Hydrocele excision   . Cataract extraction w/ intraocular lens implant ~ 2009    right     Family History: Family History  Problem Relation Age of Onset  . Hypertension Mother   .  Heart disease Mother   . Diabetes Sister      Social History:  reports that he has quit smoking. His smoking use included Cigarettes. He has a 40 pack-year smoking history. He has never used smokeless tobacco. He reports that he does not drink alcohol or use illicit drugs.   Review of Systems: As per HPI  Vital Signs: Blood pressure 117/64, pulse 75, temperature 98.7 F (37.1 C), temperature source Oral, resp. rate 20, height 5\' 9"  (1.753 m), weight 228 lb 8 oz (103.647 kg), SpO2 95.00%.  Weight trends: Filed Weights   04/04/12 1948 04/04/12 2010  Weight: 227 lb 8 oz (103.193 kg) 228 lb 8 oz (103.647 kg)    Physical Exam: General: Vital signs reviewed and noted. Well-developed, well-nourished, in no acute distress; alert, appropriate and cooperative throughout examination.  Head: Normocephalic, atraumatic.  Eyes: PERRL, EOMI, No signs of anemia or jaundince.  Nose: Mucous membranes moist,  not inflammed, nonerythematous.  Throat: Oropharynx nonerythematous, no exudate appreciated. Oral ulcer in the right side of mouth and hard palate  Neck: No deformities, masses, or tenderness noted.Supple, No carotid Bruits, no JVD.  Lungs:  Normal respiratory effort. Clear to auscultation BL without crackles or wheezes.  Heart: RRR. S1 and S2 normal without gallop, murmur, or rubs.  Abdomen:  BS normoactive. Soft, Nondistended, non-tender.  No masses or organomegaly.  Extremities: R axilla has some blisters with skin breakdown, swollen and tender to palpation. Trace pedal edema over b/l  LLE  Neurologic: A&O X3, CN II - XII are grossly intact. Motor strength is 5/5 in the all 4 extremities, Sensations intact to light touch, Cerebellar signs negative.  Skin: No visible rashes, scars.    Lab results: Basic Metabolic Panel:  Lab [id number] 04/04/12 1956 04/04/12 1341  NA 135 -- 131*  K 2.9* -- 3.0*  CL 100 -- 96  CO2 24 -- 21  GLUCOSE 309* -- 311*  BUN 29* -- 29*  CREATININE 1.63* 1.78* 1.78*  CALCIUM 8.2* -- 8.1*  MG -- -- --  PHOS -- -- --    Liver Function Tests:  Lab 04/05/12 0350  AST 18  ALT 19  ALKPHOS 38*  BILITOT 0.4  PROT 5.1*  ALBUMIN 2.1*    CBC:  Lab 04/05/12 0350 04/04/12 1956 04/04/12 1341  WBC 0.8* 0.7* 0.9*  NEUTROABS -- -- 0.2*  HGB 9.4* 9.6* 10.0*  HCT 27.4* 28.3* 29.4*  MCV 86.7 87.9 87.0  PLT 121* 111* 121*    Cardiac Enzymes:  Lab 04/05/12 1120 04/05/12 0350 04/04/12 1956  CKTOTAL 46 67 89  CKMB 1.9 2.3 2.2  CKMBINDEX -- -- --  TROPONINI <0.30 <0.30 <0.30    CBG:  Lab 04/05/12 1122 04/05/12 0709 04/05/12 0237 04/04/12 2216 04/04/12 1908  GLUCAP 284* 267* 335* 45* 73*    Microbiology: Results for orders placed during the hospital encounter of 04/04/12  CULTURE, BLOOD (ROUTINE X 2)     Status: Normal (Preliminary result)   Collection Time   04/04/12  3:10 PM      Component Value Range Status Comment   Specimen Description  BLOOD ARM RIGHT   Final    Special Requests BOTTLES DRAWN AEROBIC AND ANAEROBIC 10CC   Final    Culture  Setup Time [id number]   Final    Culture     Final    Value:        BLOOD CULTURE RECEIVED NO GROWTH TO DATE CULTURE WILL BE HELD FOR 5 DAYS  BEFORE ISSUING A FINAL NEGATIVE REPORT   Report Status PENDING   Incomplete   CULTURE, BLOOD (ROUTINE X 2)     Status: Normal (Preliminary result)   Collection Time   04/04/12  3:20 PM      Component Value Range Status Comment   Specimen Description BLOOD HAND RIGHT   Final    Special Requests     Final    Value: BOTTLES DRAWN AEROBIC AND ANAEROBIC AERO 7CC,ANAE 3CC   Culture  Setup Time [id number]   Final    Culture     Final    Value:        BLOOD CULTURE RECEIVED NO GROWTH TO DATE CULTURE WILL BE HELD FOR 5 DAYS BEFORE ISSUING A FINAL NEGATIVE REPORT   Report Status PENDING   Incomplete     Urinalysis:  Basename 04/04/12 1626  COLORURINE YELLOW  LABSPEC 1.013  PHURINE 5.5  GLUCOSEU NEGATIVE  HGBUR NEGATIVE  BILIRUBINUR NEGATIVE  KETONESUR NEGATIVE  PROTEINUR 30*  UROBILINOGEN 1.0  NITRITE NEGATIVE  LEUKOCYTESUR NEGATIVE      Imaging: Dg Chest Port 1 View  04/04/2012  *RADIOLOGY REPORT*  Clinical Data: Shortness of breath and weakness.  PORTABLE CHEST - 1 VIEW  Comparison: 11/26/2011.  Findings: Trachea is midline.  Heart are stable. Minimal subsegmental atelectasis at the medial right lung base.  Lungs are otherwise clear.  No pleural fluid.  IMPRESSION: No acute findings.  Original Report Authenticated By: Luretha Rued, M.D.      Assessment & Plan: 1.  Neutropenic fever :He presented with WBC- 0.9 with ANC of 200. Source is R axillary abscess. Per primary team and ID . -Continue vancomycin and zosyn - F/u CT without contrast  - Send viral cultures from axilla and mouth  2. 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 He has a new baseline~ 1.7. He presented to the hospital at his baseline. He was getting treated with cyclophosphamide and prednisone. Dr. Posey Pronto started a tapering his steroids in the outpatient and was currently taking 20 mg of prednisone daily. His UA shows few squamous epithelial cells and hyaline casts - Agree with holding cytoxan and continuing daily prednisone - Continue to monitor his kidney function  3. Hypokalemia: K- 2.9 likely secondary to lasix. Check Mg levels Repleted per primary.  4. Anemia:His hemoglobin is ranging in 9 with baseline being 10.5-11. For his anemia of chronic disease, he is ongoing Procrit therapy with 10,000 q. monthly with the last dose given May of 2013.  - Hold cytoxan.  5.Hypertension : BP running soft. ON Norvasc, Coreg, Lasix and hydralazine.  - D/C hydralazine  5.Diabetes mellitus type II: ABGs are running high in the setting of prednisone and acute illness.  -Continue Lantus and NovoLog  -Per primary   6. MBD: s/p ergocalciferol supplementation. Will check Ca and P.    7. Dispo: Pending until further clinical improvement .    Patient history and plan of care reviewed with attending, Dr. Earmon Phoenix, MD  PGYII, Internal Medicine Resident 04/05/2012, 1:44 PM  I have seen and examined this patient and agree with plan as outlined above by Dr. Leonia Reeves.  Pt with neutropenic fevers related to cytoxan therapy for ANCA + vasculitis.  Possible axillary abscess and appreciate ID input regarding antibiotic therapy.  Agree with CT scan but would avoid IV contrast given CKD.   Plan  to hold cytoxan until his WBC normalizes and will also stop hydralazine due to low BP.   Will replete potassium and check Mg levels.  Will continue to follow CBC with diff and Scr. Tamas Suen A,MD 04/05/2012 4:57 PM

## 2012-04-05 NOTE — Progress Notes (Signed)
[facility] Progress Note  04/05/2012  Subjective: Pt reports tenderness in right axillary area and oral ulcer developed in mouth.    Objective:  Vital signs in last 24 hours: Filed Vitals:   04/04/12 1904 04/04/12 1948 04/04/12 2010 04/05/12 0738  BP: 105/53  101/52 117/64  Pulse: 71  69 75  Temp: 99.5 F (37.5 C)  97.9 F (36.6 C) 98.7 F (37.1 C)  TempSrc: Oral  Oral Oral  Resp: 23  22 20   Height:  5\' 9"  (1.753 m) 5\' 9"  (1.753 m)   Weight:  103.193 kg (227 lb 8 oz) 103.647 kg (228 lb 8 oz)   SpO2: 98%  97% 95%   Weight change:   Intake/Output Summary (Last 24 hours) at 04/05/12 [id number] Last data filed at 04/05/12 0102  Gross per 24 hour  Intake      0 ml  Output    850 ml  Net   -850 ml   Lab Results  Component Value Date   HGBA1C 7.4* 04/04/2012   HGBA1C 6.7* 11/24/2011   Lab Results  Component Value Date   CREATININE 1.63* 04/05/2012    Review of Systems As above, otherwise all reviewed and reported negative  Physical Exam Gen.-chronically ill-appearing male, awake alert cooperative and pleasant  HEENT normal cephalic atraumatic, mucous membranes moist, large right cheek oral apthous ulcer noted Neck supple, no JVD  Lungs-bilateral breath sounds clear auscultation  CV-normal S1-S2 sounds without murmurs rubs or gallops  Abdomen-obese, soft nondistended nontender no masses palpated  Extremity-tenderness under the right axilla, several small abscess noted, multiple skin tags noted, mild pretibial edema bilateral, no clubbing or cyanosis noted  Neuro-nonfocal exam  Lab Results: Results for orders placed during the hospital encounter of 04/04/12 (from the past 24 hour(s))  CBC     Status: Abnormal   Collection Time   04/04/12  1:41 PM      Component Value Range   WBC 0.9 (*) 4.0 - 10.5 (K/uL)   RBC 3.38 (*) 4.22 - 5.81 (MIL/uL)   Hemoglobin 10.0 (*) 13.0 - 17.0 (g/dL)   HCT 29.4 (*) 39.0 - 52.0 (%)   MCV 87.0  78.0 - 100.0 (fL)   MCH 29.6  26.0 -  34.0 (pg)   MCHC 34.0  30.0 - 36.0 (g/dL)   RDW 17.6 (*) 11.5 - 15.5 (%)   Platelets 121 (*) 150 - 400 (K/uL)  DIFFERENTIAL     Status: Abnormal   Collection Time   04/04/12  1:41 PM      Component Value Range   Neutrophils Relative 12 (*) 43 - 77 (%)   Lymphocytes Relative 70 (*) 12 - 46 (%)   Monocytes Relative 10  3 - 12 (%)   Eosinophils Relative 2  0 - 5 (%)   Basophils Relative 0  0 - 1 (%)   Band Neutrophils 6  0 - 10 (%)   Metamyelocytes Relative 0     Myelocytes 0     Promyelocytes Absolute 0     Blasts 0     nRBC 0  0 (/100 WBC)   Neutro Abs 0.2 (*) 1.7 - 7.7 (K/uL)   Lymphs Abs 0.6 (*) 0.7 - 4.0 (K/uL)   Monocytes Absolute 0.1  0.1 - 1.0 (K/uL)   Eosinophils Absolute 0.0  0.0 - 0.7 (K/uL)   Basophils Absolute 0.0  0.0 - 0.1 (K/uL)   RBC Morphology ELLIPTOCYTES    BASIC METABOLIC PANEL     Status:  Abnormal   Collection Time   04/04/12  1:41 PM      Component Value Range   Sodium 131 (*) 135 - 145 (mEq/L)   Potassium 3.0 (*) 3.5 - 5.1 (mEq/L)   Chloride 96  96 - 112 (mEq/L)   CO2 21  19 - 32 (mEq/L)   Glucose, Bld 311 (*) 70 - 99 (mg/dL)   BUN 29 (*) 6 - 23 (mg/dL)   Creatinine, Ser 1.78 (*) 0.50 - 1.35 (mg/dL)   Calcium 8.1 (*) 8.4 - 10.5 (mg/dL)   GFR calc non Af Amer 35 (*) >90 (mL/min)   GFR calc Af Amer 41 (*) >90 (mL/min)  CULTURE, BLOOD (ROUTINE X 2)     Status: Normal (Preliminary result)   Collection Time   04/04/12  3:10 PM      Component Value Range   Specimen Description BLOOD ARM RIGHT     Special Requests BOTTLES DRAWN AEROBIC AND ANAEROBIC 10CC     Culture  Setup Time [id number]     Culture       Value:        BLOOD CULTURE RECEIVED NO GROWTH TO DATE CULTURE WILL BE HELD FOR 5 DAYS BEFORE ISSUING A FINAL NEGATIVE REPORT   Report Status PENDING    CULTURE, BLOOD (ROUTINE X 2)     Status: Normal (Preliminary result)   Collection Time   04/04/12  3:20 PM      Component Value Range   Specimen Description BLOOD HAND RIGHT     Special Requests        Value: BOTTLES DRAWN AEROBIC AND ANAEROBIC AERO 7CC,ANAE 3CC   Culture  Setup Time [id number]     Culture       Value:        BLOOD CULTURE RECEIVED NO GROWTH TO DATE CULTURE WILL BE HELD FOR 5 DAYS BEFORE ISSUING A FINAL NEGATIVE REPORT   Report Status PENDING    LACTIC ACID, PLASMA     Status: Normal   Collection Time   04/04/12  3:28 PM      Component Value Range   Lactic Acid, Venous 1.5  0.5 - 2.2 (mmol/L)  URINALYSIS, ROUTINE W REFLEX MICROSCOPIC     Status: Abnormal   Collection Time   04/04/12  4:26 PM      Component Value Range   Color, Urine YELLOW  YELLOW    APPearance CLEAR  CLEAR    Specific Gravity, Urine 1.013  1.005 - 1.030    pH 5.5  5.0 - 8.0    Glucose, UA NEGATIVE  NEGATIVE (mg/dL)   Hgb urine dipstick NEGATIVE  NEGATIVE    Bilirubin Urine NEGATIVE  NEGATIVE    Ketones, ur NEGATIVE  NEGATIVE (mg/dL)   Protein, ur 30 (*) NEGATIVE (mg/dL)   Urobilinogen, UA 1.0  0.0 - 1.0 (mg/dL)   Nitrite NEGATIVE  NEGATIVE    Leukocytes, UA NEGATIVE  NEGATIVE   URINE MICROSCOPIC-ADD ON     Status: Abnormal   Collection Time   04/04/12  4:26 PM      Component Value Range   Squamous Epithelial / LPF FEW (*) RARE    WBC, UA 0-2  <3 (WBC/hpf)   RBC / HPF 0-2  <3 (RBC/hpf)   Bacteria, UA RARE  RARE    Casts HYALINE CASTS (*) NEGATIVE   HEMOGLOBIN A1C     Status: Abnormal   Collection Time   04/04/12  7:06 PM      Component  Value Range   Hemoglobin A1C 7.4 (*) <5.7 (%)   Mean Plasma Glucose 166 (*) <117 (mg/dL)  GLUCOSE, CAPILLARY     Status: Abnormal   Collection Time   04/04/12  7:08 PM      Component Value Range   Glucose-Capillary 412 (*) 70 - 99 (mg/dL)   Comment 1 Documented in Chart     Comment 2 Notify RN    CBC     Status: Abnormal   Collection Time   04/04/12  7:56 PM      Component Value Range   WBC 0.7 (*) 4.0 - 10.5 (K/uL)   RBC 3.22 (*) 4.22 - 5.81 (MIL/uL)   Hemoglobin 9.6 (*) 13.0 - 17.0 (g/dL)   HCT 28.3 (*) 39.0 - 52.0 (%)   MCV 87.9  78.0 - 100.0  (fL)   MCH 29.8  26.0 - 34.0 (pg)   MCHC 33.9  30.0 - 36.0 (g/dL)   RDW 17.6 (*) 11.5 - 15.5 (%)   Platelets 111 (*) 150 - 400 (K/uL)  CREATININE, SERUM     Status: Abnormal   Collection Time   04/04/12  7:56 PM      Component Value Range   Creatinine, Ser 1.78 (*) 0.50 - 1.35 (mg/dL)   GFR calc non Af Amer 35 (*) >90 (mL/min)   GFR calc Af Amer 41 (*) >90 (mL/min)  CARDIAC PANEL(CRET KIN+CKTOT+MB+TROPI)     Status: Normal   Collection Time   04/04/12  7:56 PM      Component Value Range   Total CK 89  7 - 232 (U/L)   CK, MB 2.2  0.3 - 4.0 (ng/mL)   Troponin I <0.30  <0.30 (ng/mL)   Relative Index RELATIVE INDEX IS INVALID  0.0 - 2.5   PRO B NATRIURETIC PEPTIDE     Status: Abnormal   Collection Time   04/04/12  7:56 PM      Component Value Range   Pro B Natriuretic peptide (BNP) 2517.0 (*) 0 - 450 (pg/mL)  GLUCOSE, CAPILLARY     Status: Abnormal   Collection Time   04/04/12 10:16 PM      Component Value Range   Glucose-Capillary 403 (*) 70 - 99 (mg/dL)   Comment 1 Documented in Chart     Comment 2 Notify RN    GLUCOSE, CAPILLARY     Status: Abnormal   Collection Time   04/05/12  2:37 AM      Component Value Range   Glucose-Capillary 335 (*) 70 - 99 (mg/dL)   Comment 1 Documented in Chart     Comment 2 Notify RN    CARDIAC PANEL(CRET KIN+CKTOT+MB+TROPI)     Status: Normal   Collection Time   04/05/12  3:50 AM      Component Value Range   Total CK 67  7 - 232 (U/L)   CK, MB 2.3  0.3 - 4.0 (ng/mL)   Troponin I <0.30  <0.30 (ng/mL)   Relative Index RELATIVE INDEX IS INVALID  0.0 - 2.5   COMPREHENSIVE METABOLIC PANEL     Status: Abnormal   Collection Time   04/05/12  3:50 AM      Component Value Range   Sodium 135  135 - 145 (mEq/L)   Potassium 2.9 (*) 3.5 - 5.1 (mEq/L)   Chloride 100  96 - 112 (mEq/L)   CO2 24  19 - 32 (mEq/L)   Glucose, Bld 309 (*) 70 - 99 (mg/dL)   BUN  29 (*) 6 - 23 (mg/dL)   Creatinine, Ser 1.63 (*) 0.50 - 1.35 (mg/dL)   Calcium 8.2 (*) 8.4 - 10.5  (mg/dL)   Total Protein 5.1 (*) 6.0 - 8.3 (g/dL)   Albumin 2.1 (*) 3.5 - 5.2 (g/dL)   AST 18  0 - 37 (U/L)   ALT 19  0 - 53 (U/L)   Alkaline Phosphatase 38 (*) 39 - 117 (U/L)   Total Bilirubin 0.4  0.3 - 1.2 (mg/dL)   GFR calc non Af Amer 39 (*) >90 (mL/min)   GFR calc Af Amer 45 (*) >90 (mL/min)  CBC     Status: Abnormal   Collection Time   04/05/12  3:50 AM      Component Value Range   WBC 0.8 (*) 4.0 - 10.5 (K/uL)   RBC 3.16 (*) 4.22 - 5.81 (MIL/uL)   Hemoglobin 9.4 (*) 13.0 - 17.0 (g/dL)   HCT 27.4 (*) 39.0 - 52.0 (%)   MCV 86.7  78.0 - 100.0 (fL)   MCH 29.7  26.0 - 34.0 (pg)   MCHC 34.3  30.0 - 36.0 (g/dL)   RDW 17.1 (*) 11.5 - 15.5 (%)   Platelets 121 (*) 150 - 400 (K/uL)  GLUCOSE, CAPILLARY     Status: Abnormal   Collection Time   04/05/12  7:09 AM      Component Value Range   Glucose-Capillary 267 (*) 70 - 99 (mg/dL)   Comment 1 Documented in Chart     Comment 2 Notify RN      Micro Results: Recent Results (from the past 240 hour(s))  CULTURE, BLOOD (ROUTINE X 2)     Status: Normal (Preliminary result)   Collection Time   04/04/12  3:10 PM      Component Value Range Status Comment   Specimen Description BLOOD ARM RIGHT   Final    Special Requests BOTTLES DRAWN AEROBIC AND ANAEROBIC 10CC   Final    Culture  Setup Time [id number]   Final    Culture     Final    Value:        BLOOD CULTURE RECEIVED NO GROWTH TO DATE CULTURE WILL BE HELD FOR 5 DAYS BEFORE ISSUING A FINAL NEGATIVE REPORT   Report Status PENDING   Incomplete   CULTURE, BLOOD (ROUTINE X 2)     Status: Normal (Preliminary result)   Collection Time   04/04/12  3:20 PM      Component Value Range Status Comment   Specimen Description BLOOD HAND RIGHT   Final    Special Requests     Final    Value: BOTTLES DRAWN AEROBIC AND ANAEROBIC AERO 7CC,ANAE 3CC   Culture  Setup Time [id number]   Final    Culture     Final    Value:        BLOOD CULTURE RECEIVED NO GROWTH TO DATE CULTURE WILL BE HELD FOR 5 DAYS  BEFORE ISSUING A FINAL NEGATIVE REPORT   Report Status PENDING   Incomplete     Medications:  Scheduled Meds:   . acetaminophen  650 mg Oral Once  . allopurinol  100 mg Oral Daily  . amLODipine  10 mg Oral Daily  . aspirin EC  81 mg Oral Daily  . carvedilol  12.5 mg Oral BID WC  . dutasteride  0.5 mg Oral Daily  . furosemide  40 mg Oral Daily  . heparin  5,000 Units Subcutaneous Q8H  . hydrALAZINE  100 mg  Oral BID  . insulin aspart  0-9 Units Subcutaneous TID WC  . insulin aspart  5 Units Subcutaneous Once  . insulin aspart  5 Units Subcutaneous TID WC  . insulin glargine  15 Units Subcutaneous QHS  . mupirocin cream   Topical BID  . potassium chloride  40 mEq Oral BID  . predniSONE  10 mg Oral Daily  . sevelamer  800 mg Oral TID WC  . Tamsulosin HCl  0.4 mg Oral Daily  . vancomycin  1,500 mg Intravenous Q24H  . vancomycin  1,000 mg Intravenous Once  . vancomycin  1,000 mg Intravenous NOW  . DISCONTD: aspirin  81 mg Oral Daily  . DISCONTD: furosemide  40 mg Oral Daily  . DISCONTD: insulin glargine  10 Units Subcutaneous QHS  . DISCONTD: vancomycin  2,000 mg Intravenous To Major   Continuous Infusions:   . 0.9 % NaCl with KCl 40 mEq / L 75 mL/hr at 04/04/12 2307   PRN Meds:.acetaminophen, acetaminophen, senna-docusate  Assessment/Plan: Neutropenia/fever - Hematology was consulted to see pt.  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

## 2012-04-05 NOTE — Progress Notes (Signed)
Inpatient Diabetes Program Recommendations  AACE/ADA: New Consensus Statement on Inpatient Glycemic Control (2009)  Target Ranges:  Prepandial:   less than 140 mg/dL      Peak postprandial:   less than 180 mg/dL (1-2 hours)      Critically ill patients:  140 - 180 mg/dL     Inpatient Diabetes Program Recommendations Correction (SSI): increase Novolog correction to MODERATE scale  Thank you  [facility] Inpatient Diabetes Coordinator [phone]

## 2012-04-05 NOTE — Evaluation (Signed)
Physical Therapy Evaluation Patient Details Name: Ryan Jimenez MRN: [id number] DOB: 06/07/1932 Today's Date: 04/05/2012 Time: [id number] PT Time Calculation (min): 26 min  PT Assessment / Plan / Recommendation Clinical Impression  pt presents with R axillary abscess and general weakness.  pt generally unsteady and discussed using 4WW at D/C and getting HHPT.      PT Assessment  Patient needs continued PT services    Follow Up Recommendations  Home health PT;Supervision/Assistance - 24 hour    Barriers to Discharge None      lEquipment Recommendations  None recommended by PT    Recommendations for Other Services     Frequency Min 3X/week    Precautions / Restrictions Precautions Precautions: Fall Restrictions Weight Bearing Restrictions: No   Pertinent Vitals/Pain Notes R axilla tender, but did not rate.        Mobility  Bed Mobility Bed Mobility: Supine to Sit;Sitting - Scoot to Edge of Bed Supine to Sit: 4: Min assist;HOB elevated (HOB ~35degrees) Sitting - Scoot to Edge of Bed: 4: Min assist Details for Bed Mobility Assistance: A with trunk.  cues for sequencing.   Transfers Transfers: Sit to Stand;Stand to Sit Sit to Stand: 4: Min assist;With upper extremity assist;From bed Stand to Sit: 4: Min guard;With upper extremity assist;With armrests;To chair/3-in-1 Details for Transfer Assistance: cues for use of UEs.  pt mildly unsteady and needed MinA for balance with Sit to stand.   Ambulation/Gait Ambulation/Gait Assistance: 4: Min guard;4: Min Wellsite geologist (Feet): 120 Feet Assistive device: Straight cane Ambulation/Gait Assistance Details: pt generally unsteady and would benefit from using RW next time.  pt with R LOB requiring MinA to correct.   Gait Pattern: Step-through pattern;Decreased stride length;Lateral trunk lean to right;Lateral trunk lean to left;Wide base of support (Lateral Lean R > L.  ) Stairs: No Wheelchair Mobility Wheelchair  Mobility: No    Exercises     PT Diagnosis: Difficulty walking  PT Problem List: Decreased strength;Decreased activity tolerance;Decreased balance;Decreased mobility;Decreased knowledge of use of DME PT Treatment Interventions: DME instruction;Gait training;Stair training;Functional mobility training;Therapeutic activities;Therapeutic exercise;Balance training;Patient/family education   PT Goals Acute Rehab PT Goals PT Goal Formulation: With patient Time For Goal Achievement: 04/19/12 Potential to Achieve Goals: Good Pt will go Supine/Side to Sit: Independently PT Goal: Supine/Side to Sit - Progress: Goal set today Pt will go Sit to Supine/Side: Independently PT Goal: Sit to Supine/Side - Progress: Goal set today Pt will go Sit to Stand: with modified independence;with upper extremity assist PT Goal: Sit to Stand - Progress: Goal set today Pt will go Stand to Sit: with modified independence;with upper extremity assist PT Goal: Stand to Sit - Progress: Goal set today Pt will Ambulate: >150 feet;with modified independence;with rolling walker PT Goal: Ambulate - Progress: Goal set today Pt will Go Up / Down Stairs: 3-5 stairs;with supervision;with rail(s) PT Goal: Up/Down Stairs - Progress: Goal set today  Visit Information  Last PT Received On: 04/05/12 Assistance Needed: +1 PT/OT Co-Evaluation/Treatment: Yes    Subjective Data  Subjective: I feel like I've been in bed too long.   Patient Stated Goal: Home   Prior Functioning  Home Living Lives With: Spouse Available Help at Discharge: Family;Available 24 hours/day Type of Home: House Home Access: Stairs to enter [employer] of Steps: 2 Entrance Stairs-Rails: None Home Layout: One level Bathroom Shower/Tub: Tub/shower unit;Curtain Bathroom Toilet: Handicapped height Bathroom Accessibility: Yes How Accessible: Accessible via walker Home Adaptive Equipment: Bedside commode/3-in-1;Tub transfer bench;Hand-held  shower  hose;Grab bars around toilet;Grab bars in shower (rollator with seat) Prior Function Level of Independence: Independent Able to Take Stairs?: Yes Driving: Yes Vocation: Retired Corporate investment banker: No difficulties Dominant Hand: Right    Cognition  Overall Cognitive Status: Appears within functional limits for tasks assessed/performed Arousal/Alertness: Awake/alert Orientation Level: Oriented X4 / Intact Behavior During Session: [facility] for tasks performed    Extremity/Trunk Assessment Right Lower Extremity Assessment RLE ROM/Strength/Tone: WFL for tasks assessed RLE Sensation: WFL - Light Touch Left Lower Extremity Assessment LLE ROM/Strength/Tone: WFL for tasks assessed LLE Sensation: WFL - Light Touch   Balance Balance Balance Assessed: No  End of Session PT - End of Session Equipment Utilized During Treatment: Gait belt Activity Tolerance: Patient tolerated treatment well Patient left: in chair;with call bell/phone within reach;with family/visitor present Nurse Communication: Mobility status   Catarina Hartshorn, Rosa 04/05/2012, 11:58 AM

## 2012-04-05 NOTE — Progress Notes (Signed)
ANTIBIOTIC CONSULT NOTE - INITIAL  Pharmacy Consult for Zosyn Indication: right axillary abscess  No Known Allergies  Patient Measurements: Height: 5\' 9"  (175.3 cm) Weight: 228 lb 8 oz (103.647 kg) IBW/kg (Calculated) : 70.7   Vital Signs: Temp: 98.7 F (37.1 C) (05/23 0738) Temp src: Oral (05/23 0738) BP: 117/64 mmHg (05/23 0738) Pulse Rate: 75  (05/23 0738) Intake/Output from previous day: 05/22 0701 - 05/23 0700 In: -  Out: 850 [Urine:850] Intake/Output from this shift:    Labs:  Basename 04/05/12 0350 04/04/12 1956 04/04/12 1341  WBC 0.8* 0.7* 0.9*  HGB 9.4* 9.6* 10.0*  PLT 121* 111* 121*  LABCREA -- -- --  CREATININE 1.63* 1.78* 1.78*   Estimated Creatinine Clearance: 45 ml/min (by C-G formula based on Cr of 1.63). No results found for this basename: VANCOTROUGH:2,VANCOPEAK:2,VANCORANDOM:2,GENTTROUGH:2,GENTPEAK:2,GENTRANDOM:2,TOBRATROUGH:2,TOBRAPEAK:2,TOBRARND:2,AMIKACINPEAK:2,AMIKACINTROU:2,AMIKACIN:2, in the last 72 hours   Microbiology: Recent Results (from the past 720 hour(s))  CULTURE, BLOOD (ROUTINE X 2)     Status: Normal (Preliminary result)   Collection Time   04/04/12  3:10 PM      Component Value Range Status Comment   Specimen Description BLOOD ARM RIGHT   Final    Special Requests BOTTLES DRAWN AEROBIC AND ANAEROBIC 10CC   Final    Culture  Setup Time [id number]   Final    Culture     Final    Value:        BLOOD CULTURE RECEIVED NO GROWTH TO DATE CULTURE WILL BE HELD FOR 5 DAYS BEFORE ISSUING A FINAL NEGATIVE REPORT   Report Status PENDING   Incomplete   CULTURE, BLOOD (ROUTINE X 2)     Status: Normal (Preliminary result)   Collection Time   04/04/12  3:20 PM      Component Value Range Status Comment   Specimen Description BLOOD HAND RIGHT   Final    Special Requests     Final    Value: BOTTLES DRAWN AEROBIC AND ANAEROBIC AERO 7CC,ANAE 3CC   Culture  Setup Time [id number]   Final    Culture     Final    Value:        BLOOD CULTURE  RECEIVED NO GROWTH TO DATE CULTURE WILL BE HELD FOR 5 DAYS BEFORE ISSUING A FINAL NEGATIVE REPORT   Report Status PENDING   Incomplete     Medical History: Past Medical History  Diagnosis Date  . Hypertension   . Diabetes mellitus   . Hypercholesterolemia   . Gout   . Shortness of breath on exertion   . Bronchitis   . Arthritis   . Near syncope 10/2011; 11/2011  . Renal disorder   . CKD (chronic kidney disease)   . Glomerulonephritis   . Pancytopenia   . Anemia    Assessment: Patient known to pharmacy from Vancomycin dosing for R axillary abscess. Noted low grd fever, WBC suppressed- patient on immunosuppression for ANCA postive vasculitis.   5/22 Vancomycin >> 5/23 Zosyn >>  5/22 Blood cx ngtd  Noted GFR > 20 ml/min. Scr trending down from admission.  Plan:  - Zosyn 3.375gm IV Q8h, each dose infused over 4 hours. - Will f/up along with you.   Posey Pronto, Mainor Hellmann K 04/05/2012,3:38 PM

## 2012-04-05 NOTE — Progress Notes (Signed)
INITIAL ADULT NUTRITION ASSESSMENT Date: 04/05/2012   Time: 9:25 AM Reason for Assessment: Nutrition Risk  ASSESSMENT: Male 78 y.o.  Dx: Abscess of axillary region  Hx:  Past Medical History  Diagnosis Date  . Hypertension   . Diabetes mellitus   . Hypercholesterolemia   . Gout   . Shortness of breath on exertion   . Bronchitis   . Arthritis   . Near syncope 10/2011; 11/2011  . Renal disorder   . CKD (chronic kidney disease)   . Glomerulonephritis   . Pancytopenia   . Anemia    Past Surgical History  Procedure Date  . Hydrocele excision   . Cataract extraction w/ intraocular lens implant ~ 2009    right    Related Meds:     . acetaminophen  650 mg Oral Once  . allopurinol  100 mg Oral Daily  . amLODipine  10 mg Oral Daily  . aspirin EC  81 mg Oral Daily  . carvedilol  12.5 mg Oral BID WC  . dutasteride  0.5 mg Oral Daily  . furosemide  40 mg Oral Daily  . heparin  5,000 Units Subcutaneous Q8H  . hydrALAZINE  100 mg Oral BID  . insulin aspart  0-9 Units Subcutaneous TID WC  . insulin aspart  5 Units Subcutaneous Once  . insulin aspart  5 Units Subcutaneous TID WC  . insulin glargine  15 Units Subcutaneous QHS  . potassium chloride  40 mEq Oral BID  . predniSONE  10 mg Oral Daily  . sevelamer  800 mg Oral TID WC  . Tamsulosin HCl  0.4 mg Oral Daily  . vancomycin  1,500 mg Intravenous Q24H  . vancomycin  1,000 mg Intravenous Once  . vancomycin  1,000 mg Intravenous NOW  . DISCONTD: aspirin  81 mg Oral Daily  . DISCONTD: furosemide  40 mg Oral Daily  . DISCONTD: insulin glargine  10 Units Subcutaneous QHS  . DISCONTD: vancomycin  2,000 mg Intravenous To Major   Ht: 5\' 9"  (175.3 cm)  Wt: 228 lb 8 oz (103.647 kg)  Ideal Wt: 72.7 kg % Ideal Wt: 142%  Usual Wt:  Wt Readings from Last 10 Encounters:  04/04/12 228 lb 8 oz (103.647 kg)  12/15/11 254 lb 13.6 oz (115.6 kg)   % Usual Wt: 90% 10% wt loss x 4 months  Body mass index is 33.74 kg/(m^2).  Obesity Class I  Food/Nutrition Related Hx: per pt he has lost a lot of weight since Jan 2013 due to Glomerulonephritis and treatment. Pt states that he is eating better but did have periods PTA where he had mouth sores and could not eat and poor appetite and was given an appetite stimulant. Per pt he was told not to eat salt or any carbohydrate. Pt confused about diet here since he is allowed carbohydrate.   Labs:  CMP     Component Value Date/Time   NA 135 04/05/2012 0350   K 2.9* 04/05/2012 0350   CL 100 04/05/2012 0350   CO2 24 04/05/2012 0350   GLUCOSE 309* 04/05/2012 0350   BUN 29* 04/05/2012 0350   CREATININE 1.63* 04/05/2012 0350   CALCIUM 8.2* 04/05/2012 0350   PROT 5.1* 04/05/2012 0350   ALBUMIN 2.1* 04/05/2012 0350   AST 18 04/05/2012 0350   ALT 19 04/05/2012 0350   ALKPHOS 38* 04/05/2012 0350   BILITOT 0.4 04/05/2012 0350   GFRNONAA 39* 04/05/2012 0350   GFRAA 45* 04/05/2012 0350   CBG (  last 3)   Basename 04/05/12 0709 04/05/12 0237 04/04/12 2216  GLUCAP 267* 335* 403*    Lab Results  Component Value Date   HGBA1C 7.4* 04/04/2012   Phosphorus  Date Value Range Status  03/07/2012 4.0  2.3-4.6 (mg/dL) Final   Magnesium  Date Value Range Status  03/07/2012 1.7  1.5-2.5 (mg/dL) Final    Intake/Output Summary (Last 24 hours) at 04/05/12 0928 Last data filed at 04/05/12 0102  Gross per 24 hour  Intake      0 ml  Output    850 ml  Net   -850 ml   UOP: 550 ml this am at 01:02  Diet Order: CHO Modified Medium  Supplements/Tube Feeding: none  IVF:    0.9 % NaCl with KCl 40 mEq / L Last Rate: 75 mL/hr at 04/04/12 2307    Estimated Nutritional Needs:   Kcal: 1900-2100 Protein: 80-100 grams Fluid: >2 L/day  1. NUTRITION DIAGNOSIS: -Inadequate oral intake (NI-2.1).  Status: Ongoing  RELATED TO: poor appetite  AS EVIDENCE BY: pt consuming < 50% of his meals.  MONITORING/EVALUATION(Goals): Goal: Pt will consume >/= 90% of his estimated needs. Monitor: po intake,  weight, labs  2. Nutrition Diagnosis: - Food and Knowledge Related Deficit r/t limited prior education on DM diet AEB hgbA1C of 7.4. Goal: Pt can verbalize sources of CHO and importance of eating a consistent amount of CHO; met.  EDUCATION NEEDS: -Education needs addressed  INTERVENTION:  Provided CHO diet education  Gluc[facility] at [facility]  Dietitian [phone]  DOCUMENTATION CODES Per approved criteria  -Obesity Unspecified    Ryan Jimenez 04/05/2012, 9:25 AM

## 2012-04-05 NOTE — Progress Notes (Signed)
Occupational Therapy Evaluation Patient Details Name: Ryan Jimenez MRN: [id number] DOB: 1932/10/29 Today's Date: 04/05/2012 Time: [id number] OT Time Calculation (min): 26 min  OT Assessment / Plan / Recommendation Clinical Impression  Pt presents with R axillary abscess and general weakness. Will benefit from acute OT to address below problem list in prep for d/c home with wife.    OT Assessment  Patient needs continued OT Services    Follow Up Recommendations  Supervision/Assistance - 24 hour    Barriers to Discharge      Equipment Recommendations  None recommended by OT    Recommendations for Other Services    Frequency  Min 2X/week    Precautions / Restrictions Precautions Precautions: Fall Restrictions Weight Bearing Restrictions: No   Pertinent Vitals/Pain RUE during ROM and MMT but pt unable to rate.    ADL  Grooming: Performed;Wash/dry hands;Min guard Where Assessed - Grooming: Unsupported standing Upper Body Dressing: Performed;Set up Where Assessed - Upper Body Dressing: Unsupported sitting Lower Body Dressing: Performed;Supervision/safety Where Assessed - Lower Body Dressing: Unsupported sitting Toilet Transfer: Simulated;Min guard Toilet Transfer Method:  (ambulating) Science writer:  (recliner) Equipment Used: Cane;Gait belt Transfers/Ambulation Related to ADLs: Pt ambulated with min guard and cane.  Pt unsteady and required min guard for safety. ADL Comments: Pt near baseline with BADLs. Pt reports that wife has been assisting pt with feeding due to RUE pain, but question pt's true need for assist vs. self limiting.    OT Diagnosis: Generalized weakness;Acute pain  OT Problem List: Decreased activity tolerance;Decreased strength;Impaired balance (sitting and/or standing);Pain OT Treatment Interventions: Self-care/ADL training;DME and/or AE instruction;Therapeutic activities;Patient/family education;Balance training   OT Goals Acute Rehab OT  Goals OT Goal Formulation: With patient Time For Goal Achievement: 04/12/12 Potential to Achieve Goals: Good ADL Goals Pt Will Perform Grooming: with supervision;Standing at sink ADL Goal: Grooming - Progress: Goal set today Pt Will Transfer to Toilet: with supervision;Ambulation;with DME;3-in-1;Comfort height toilet ADL Goal: Toilet Transfer - Progress: Goal set today Pt Will Perform Tub/Shower Transfer: Tub transfer;with supervision;Transfer tub bench;with DME;Ambulation ADL Goal: Tub/Shower Transfer - Progress: Goal set today  Visit Information  Last OT Received On: 04/05/12 Assistance Needed: +1 PT/OT Co-Evaluation/Treatment: Yes    Subjective Data      Prior Functioning  Home Living Lives With: Spouse Available Help at Discharge: Family;Available 24 hours/day Type of Home: House Home Access: Stairs to enter [employer] of Steps: 2 Entrance Stairs-Rails: None Home Layout: One level Bathroom Shower/Tub: Product/process development scientist: Handicapped height Bathroom Accessibility: Yes How Accessible: Accessible via walker Home Adaptive Equipment: Bedside commode/3-in-1;Tub transfer bench;Hand-held shower hose;Grab bars around toilet;Grab bars in shower (rollator with seat) Prior Function Level of Independence: Independent Able to Take Stairs?: Yes Driving: Yes Vocation: Retired Corporate investment banker: No difficulties Dominant Hand: Right    Cognition  Overall Cognitive Status: Appears within functional limits for tasks assessed/performed Arousal/Alertness: Awake/alert Orientation Level: Oriented X4 / Intact Behavior During Session: [facility] for tasks performed    Extremity/Trunk Assessment Right Upper Extremity Assessment RUE ROM/Strength/Tone: Deficits;WFL for tasks assessed RUE ROM/Strength/Tone Deficits: AROM WFL.  shoulder abduction/flexion 3/5.  elbow, wrist, grip 4/5 Left Upper Extremity Assessment LUE ROM/Strength/Tone: Within functional  levels LUE Sensation: Deficits LUE Sensation Deficits: Pt reports some numbness in edematous hand.  Possibly due to IV placement?   Mobility Bed Mobility Bed Mobility: Supine to Sit;Sitting - Scoot to Edge of Bed Supine to Sit: 4: Min assist;HOB elevated (HOB ~35degrees) Sitting - Scoot to Edge of Bed:  4: Min assist Details for Bed Mobility Assistance: A with trunk.  cues for sequencing.   Transfers Sit to Stand: 4: Min assist;With upper extremity assist;From bed Stand to Sit: 4: Min guard;With upper extremity assist;With armrests;To chair/3-in-1 Details for Transfer Assistance: cues for use of UEs.  pt mildly unsteady and needed MinA for balance with Sit to stand.     Exercise    Balance Balance Balance Assessed: No  End of Session OT - End of Session Equipment Utilized During Treatment: Gait belt Activity Tolerance: Patient tolerated treatment well Patient left: in chair;with call bell/phone within reach;with family/visitor present Nurse Communication: Mobility status  04/05/2012 Darrol Jump OTR/L Pager [phone] Office [phone]   Darrol Jump 04/05/2012, 5:00 PM

## 2012-04-05 NOTE — Consult Note (Signed)
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   Comment: The lesion in his right axilla looks similar to hidradenitis suppurativa. Given his immunosuppression there a number of possibilities however. Most likely these would be methicillin sensitive staph aureus methicillin-resistant staph aureus. However other considerations such as Pseudomonas or Nocardia, actinomyces are also possibilities. Will perform imaging of his shoulder and exhort to get a better idea if there is a drainable fluid collection. Due to his renal problems we'll not use contrast. We'll broaden his antibiotic these organisms and none. Nephrology is going to see him today. Not clear if he needs to have his immunosuppressants changed to improve his hematologic parameters.  Ryan Jimenez is an 78 y.o. male.  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 He was placed on cytoxan and steroids and also received plasma exchange.  He was seen by his PCP last week for a R axiallry abscess. He was started on amoxil but has not improved, developing weakness and fever. He was admitted on 5-22 and found to have temp 100.4, ANC of 200. He was started on vancomycin.    Past Medical History  Diagnosis Date  . Hypertension   . Diabetes mellitus   . Hypercholesterolemia   . Gout   . Shortness of breath on exertion   . Bronchitis   . Arthritis   . Near syncope 10/2011; 11/2011  . Renal disorder   . CKD (chronic kidney disease)   . Glomerulonephritis   . Pancytopenia   . Anemia     Past Surgical History  Procedure Date  . Hydrocele excision   . Cataract  extraction w/ intraocular lens implant ~ 2009    right  ergies:   No Known Allergies  Medications:  Scheduled:   . allopurinol  100 mg Oral Daily  . amLODipine  10 mg Oral Daily  . aspirin EC  81 mg Oral Daily  . carvedilol  12.5 mg Oral BID WC  . dutasteride  0.5 mg Oral Daily  . feeding supplement  237 mL Oral Q24H  . furosemide  40 mg Oral Daily  . heparin  5,000 Units Subcutaneous Q8H  . hydrALAZINE  100 mg Oral BID  . insulin aspart  0-9 Units Subcutaneous TID WC  . insulin aspart  5 Units Subcutaneous Once  . insulin aspart  5 Units Subcutaneous TID WC  . insulin glargine  15 Units Subcutaneous QHS  . mupirocin cream   Topical BID  . potassium chloride  40 mEq Oral BID  . predniSONE  10 mg Oral Daily  . sevelamer  800 mg Oral TID WC  . Tamsulosin HCl  0.4 mg Oral Daily  . vancomycin  1,500 mg Intravenous Q24H  . vancomycin  1,000 mg Intravenous Once  . vancomycin  1,000 mg Intravenous NOW  . DISCONTD: aspirin  81 mg Oral Daily  . DISCONTD: furosemide  40 mg Oral Daily  . DISCONTD: insulin glargine  10 Units Subcutaneous QHS  . DISCONTD: vancomycin  2,000 mg Intravenous To Major    Social History:  reports that he has quit smoking. His smoking use included Cigarettes. He has a 40 pack-year smoking history.  He has never used smokeless tobacco. He reports that he does not drink alcohol or use illicit drugs.  Family History  Problem Relation Age of Onset  . Hypertension Mother   . Heart disease Mother   . Diabetes Sister     General ROS: No change in vision since January. Constipation. Normal urination. No lymphadenopathy. See history of present illness.  Blood pressure 117/64, pulse 75, temperature 98.7 F (37.1 C), temperature source Oral, resp. rate 20, height 5\' 9"  (1.753 m), weight 103.647 kg (228 lb 8 oz), SpO2 95.00%. General appearance: alert, cooperative and no distress Eyes: negative findings: pupils equal, round, reactive to light and  accomodation Throat: halotosis. he has ulceration on the roof of his mouth.  Lungs: clear to auscultation bilaterally Heart: regular rate and rhythm Abdomen: normal findings: bowel sounds normal and soft, non-tender Skin: there is firmness and a few open lesions on his R axilla. this is tender as well. there is no d/c expressabile. there is no fluctuance.  There is no cervical or supraclavicular lymphadenopathy.    Results for orders placed during the hospital encounter of 04/04/12 (from the past 48 hour(s))  CBC     Status: Abnormal   Collection Time   04/04/12  1:41 PM      Component Value Range Comment   WBC 0.9 (*) 4.0 - 10.5 (K/uL)    RBC 3.38 (*) 4.22 - 5.81 (MIL/uL)    Hemoglobin 10.0 (*) 13.0 - 17.0 (g/dL)    HCT 29.4 (*) 39.0 - 52.0 (%)    MCV 87.0  78.0 - 100.0 (fL)    MCH 29.6  26.0 - 34.0 (pg)    MCHC 34.0  30.0 - 36.0 (g/dL)    RDW 17.6 (*) 11.5 - 15.5 (%)    Platelets 121 (*) 150 - 400 (K/uL)   DIFFERENTIAL     Status: Abnormal   Collection Time   04/04/12  1:41 PM      Component Value Range Comment   Neutrophils Relative 12 (*) 43 - 77 (%)    Lymphocytes Relative 70 (*) 12 - 46 (%)    Monocytes Relative 10  3 - 12 (%)    Eosinophils Relative 2  0 - 5 (%)    Basophils Relative 0  0 - 1 (%)    Band Neutrophils 6  0 - 10 (%)    Metamyelocytes Relative 0      Myelocytes 0      Promyelocytes Absolute 0      Blasts 0      nRBC 0  0 (/100 WBC)    Neutro Abs 0.2 (*) 1.7 - 7.7 (K/uL)    Lymphs Abs 0.6 (*) 0.7 - 4.0 (K/uL)    Monocytes Absolute 0.1  0.1 - 1.0 (K/uL)    Eosinophils Absolute 0.0  0.0 - 0.7 (K/uL)    Basophils Absolute 0.0  0.0 - 0.1 (K/uL)    RBC Morphology ELLIPTOCYTES   TEARDROP CELLS  BASIC METABOLIC PANEL     Status: Abnormal   Collection Time   04/04/12  1:41 PM      Component Value Range Comment   Sodium 131 (*) 135 - 145 (mEq/L)    Potassium 3.0 (*) 3.5 - 5.1 (mEq/L)    Chloride 96  96 - 112 (mEq/L)    CO2 21  19 - 32 (mEq/L)    Glucose, Bld  311 (*) 70 - 99 (mg/dL)    BUN 29 (*) 6 - 23 (  mg/dL)    Creatinine, Ser 1.78 (*) 0.50 - 1.35 (mg/dL)    Calcium 8.1 (*) 8.4 - 10.5 (mg/dL)    GFR calc non Af Amer 35 (*) >90 (mL/min)    GFR calc Af Amer 41 (*) >90 (mL/min)   PATHOLOGIST SMEAR REVIEW     Status: Normal   Collection Time   04/04/12  1:41 PM      Component Value Range Comment   Tech Review Pancytopenia with neutropenia,lymphopenia,     CULTURE, BLOOD (ROUTINE X 2)     Status: Normal (Preliminary result)   Collection Time   04/04/12  3:10 PM      Component Value Range Comment   Specimen Description BLOOD ARM RIGHT      Special Requests BOTTLES DRAWN AEROBIC AND ANAEROBIC 10CC      Culture  Setup Time [id number]      Culture        Value:        BLOOD CULTURE RECEIVED NO GROWTH TO DATE CULTURE WILL BE HELD FOR 5 DAYS BEFORE ISSUING A FINAL NEGATIVE REPORT   Report Status PENDING     CULTURE, BLOOD (ROUTINE X 2)     Status: Normal (Preliminary result)   Collection Time   04/04/12  3:20 PM      Component Value Range Comment   Specimen Description BLOOD HAND RIGHT      Special Requests        Value: BOTTLES DRAWN AEROBIC AND ANAEROBIC AERO 7CC,ANAE 3CC   Culture  Setup Time [id number]      Culture        Value:        BLOOD CULTURE RECEIVED NO GROWTH TO DATE CULTURE WILL BE HELD FOR 5 DAYS BEFORE ISSUING A FINAL NEGATIVE REPORT   Report Status PENDING     LACTIC ACID, PLASMA     Status: Normal   Collection Time   04/04/12  3:28 PM      Component Value Range Comment   Lactic Acid, Venous 1.5  0.5 - 2.2 (mmol/L)   URINALYSIS, ROUTINE W REFLEX MICROSCOPIC     Status: Abnormal   Collection Time   04/04/12  4:26 PM      Component Value Range Comment   Color, Urine YELLOW  YELLOW     APPearance CLEAR  CLEAR     Specific Gravity, Urine 1.013  1.005 - 1.030     pH 5.5  5.0 - 8.0     Glucose, UA NEGATIVE  NEGATIVE (mg/dL)    Hgb urine dipstick NEGATIVE  NEGATIVE     Bilirubin Urine NEGATIVE  NEGATIVE     Ketones, ur  NEGATIVE  NEGATIVE (mg/dL)    Protein, ur 30 (*) NEGATIVE (mg/dL)    Urobilinogen, UA 1.0  0.0 - 1.0 (mg/dL)    Nitrite NEGATIVE  NEGATIVE     Leukocytes, UA NEGATIVE  NEGATIVE    URINE MICROSCOPIC-ADD ON     Status: Abnormal   Collection Time   04/04/12  4:26 PM      Component Value Range Comment   Squamous Epithelial / LPF FEW (*) RARE     WBC, UA 0-2  <3 (WBC/hpf)    RBC / HPF 0-2  <3 (RBC/hpf)    Bacteria, UA RARE  RARE     Casts HYALINE CASTS (*) NEGATIVE  GRANULAR CAST  HEMOGLOBIN A1C     Status: Abnormal   Collection Time   04/04/12  7:06 PM  Component Value Range Comment   Hemoglobin A1C 7.4 (*) <5.7 (%)    Mean Plasma Glucose 166 (*) <117 (mg/dL)   GLUCOSE, CAPILLARY     Status: Abnormal   Collection Time   04/04/12  7:08 PM      Component Value Range Comment   Glucose-Capillary 412 (*) 70 - 99 (mg/dL)    Comment 1 Documented in Chart      Comment 2 Notify RN     CBC     Status: Abnormal   Collection Time   04/04/12  7:56 PM      Component Value Range Comment   WBC 0.7 (*) 4.0 - 10.5 (K/uL) CRITICAL VALUE NOTED.  VALUE IS CONSISTENT WITH PREVIOUSLY REPORTED AND CALLED VALUE.   RBC 3.22 (*) 4.22 - 5.81 (MIL/uL)    Hemoglobin 9.6 (*) 13.0 - 17.0 (g/dL)    HCT 28.3 (*) 39.0 - 52.0 (%)    MCV 87.9  78.0 - 100.0 (fL)    MCH 29.8  26.0 - 34.0 (pg)    MCHC 33.9  30.0 - 36.0 (g/dL)    RDW 17.6 (*) 11.5 - 15.5 (%)    Platelets 111 (*) 150 - 400 (K/uL) PLATELET COUNT CONFIRMED BY SMEAR  CREATININE, SERUM     Status: Abnormal   Collection Time   04/04/12  7:56 PM      Component Value Range Comment   Creatinine, Ser 1.78 (*) 0.50 - 1.35 (mg/dL)    GFR calc non Af Amer 35 (*) >90 (mL/min)    GFR calc Af Amer 41 (*) >90 (mL/min)   CARDIAC PANEL(CRET KIN+CKTOT+MB+TROPI)     Status: Normal   Collection Time   04/04/12  7:56 PM      Component Value Range Comment   Total CK 89  7 - 232 (U/L)    CK, MB 2.2  0.3 - 4.0 (ng/mL)    Troponin I <0.30  <0.30 (ng/mL)    Relative  Index RELATIVE INDEX IS INVALID  0.0 - 2.5    PRO B NATRIURETIC PEPTIDE     Status: Abnormal   Collection Time   04/04/12  7:56 PM      Component Value Range Comment   Pro B Natriuretic peptide (BNP) 2517.0 (*) 0 - 450 (pg/mL)   GLUCOSE, CAPILLARY     Status: Abnormal   Collection Time   04/04/12 10:16 PM      Component Value Range Comment   Glucose-Capillary 403 (*) 70 - 99 (mg/dL)    Comment 1 Documented in Chart      Comment 2 Notify RN     GLUCOSE, CAPILLARY     Status: Abnormal   Collection Time   04/05/12  2:37 AM      Component Value Range Comment   Glucose-Capillary 335 (*) 70 - 99 (mg/dL)    Comment 1 Documented in Chart      Comment 2 Notify RN     CARDIAC PANEL(CRET KIN+CKTOT+MB+TROPI)     Status: Normal   Collection Time   04/05/12  3:50 AM      Component Value Range Comment   Total CK 67  7 - 232 (U/L)    CK, MB 2.3  0.3 - 4.0 (ng/mL)    Troponin I <0.30  <0.30 (ng/mL)    Relative Index RELATIVE INDEX IS INVALID  0.0 - 2.5    COMPREHENSIVE METABOLIC PANEL     Status: Abnormal   Collection Time   04/05/12  3:50  AM      Component Value Range Comment   Sodium 135  135 - 145 (mEq/L)    Potassium 2.9 (*) 3.5 - 5.1 (mEq/L)    Chloride 100  96 - 112 (mEq/L)    CO2 24  19 - 32 (mEq/L)    Glucose, Bld 309 (*) 70 - 99 (mg/dL)    BUN 29 (*) 6 - 23 (mg/dL)    Creatinine, Ser 1.63 (*) 0.50 - 1.35 (mg/dL)    Calcium 8.2 (*) 8.4 - 10.5 (mg/dL)    Total Protein 5.1 (*) 6.0 - 8.3 (g/dL)    Albumin 2.1 (*) 3.5 - 5.2 (g/dL)    AST 18  0 - 37 (U/L)    ALT 19  0 - 53 (U/L)    Alkaline Phosphatase 38 (*) 39 - 117 (U/L)    Total Bilirubin 0.4  0.3 - 1.2 (mg/dL)    GFR calc non Af Amer 39 (*) >90 (mL/min)    GFR calc Af Amer 45 (*) >90 (mL/min)   CBC     Status: Abnormal   Collection Time   04/05/12  3:50 AM      Component Value Range Comment   WBC 0.8 (*) 4.0 - 10.5 (K/uL) CRITICAL VALUE NOTED.  VALUE IS CONSISTENT WITH PREVIOUSLY REPORTED AND CALLED VALUE.   RBC 3.16 (*) 4.22 -  5.81 (MIL/uL)    Hemoglobin 9.4 (*) 13.0 - 17.0 (g/dL)    HCT 27.4 (*) 39.0 - 52.0 (%)    MCV 86.7  78.0 - 100.0 (fL)    MCH 29.7  26.0 - 34.0 (pg)    MCHC 34.3  30.0 - 36.0 (g/dL)    RDW 17.1 (*) 11.5 - 15.5 (%)    Platelets 121 (*) 150 - 400 (K/uL)   GLUCOSE, CAPILLARY     Status: Abnormal   Collection Time   04/05/12  7:09 AM      Component Value Range Comment   Glucose-Capillary 267 (*) 70 - 99 (mg/dL)    Comment 1 Documented in Chart      Comment 2 Notify RN     CARDIAC PANEL(CRET KIN+CKTOT+MB+TROPI)     Status: Normal   Collection Time   04/05/12 11:20 AM      Component Value Range Comment   Total CK 46  7 - 232 (U/L)    CK, MB 1.9  0.3 - 4.0 (ng/mL)    Troponin I <0.30  <0.30 (ng/mL)    Relative Index RELATIVE INDEX IS INVALID  0.0 - 2.5    GLUCOSE, CAPILLARY     Status: Abnormal   Collection Time   04/05/12 11:22 AM      Component Value Range Comment   Glucose-Capillary 284 (*) 70 - 99 (mg/dL)       Component Value Date/Time   SDES BLOOD HAND RIGHT 04/04/2012 1520   SPECREQUEST BOTTLES DRAWN AEROBIC AND ANAEROBIC AERO 7CC,ANAE 3CC 04/04/2012 1520   CULT        BLOOD CULTURE RECEIVED NO GROWTH TO DATE CULTURE WILL BE HELD FOR 5 DAYS BEFORE ISSUING A FINAL NEGATIVE REPORT 04/04/2012 1520   REPTSTATUS PENDING 04/04/2012 1520   Dg Chest Port 1 View  04/04/2012  *RADIOLOGY REPORT*  Clinical Data: Shortness of breath and weakness.  PORTABLE CHEST - 1 VIEW  Comparison: 11/26/2011.  Findings: Trachea is midline.  Heart are stable. Minimal subsegmental atelectasis at the medial right lung base.  Lungs are otherwise clear.  No pleural fluid.  IMPRESSION:  No acute findings.  Original Report Authenticated By: Luretha Rued, M.D.    Thank you so much for this interesting consult,   Zalman Rumpf [id number] 04/05/2012, 3:06 PM     LOS: 1 day

## 2012-04-06 DIAGNOSIS — N189 Chronic kidney disease, unspecified: Secondary | ICD-10-CM

## 2012-04-06 DIAGNOSIS — E1165 Type 2 diabetes mellitus with hyperglycemia: Secondary | ICD-10-CM

## 2012-04-06 DIAGNOSIS — D709 Neutropenia, unspecified: Secondary | ICD-10-CM

## 2012-04-06 DIAGNOSIS — D61818 Other pancytopenia: Secondary | ICD-10-CM

## 2012-04-06 LAB — DIFFERENTIAL
Band Neutrophils: 0 % (ref 0–10)
Basophils Relative: 3 % — ABNORMAL HIGH (ref 0–1)
Eosinophils Relative: 18 % — ABNORMAL HIGH (ref 0–5)
Lymphocytes Relative: 40 % (ref 12–46)
Lymphs Abs: 0.4 10*3/uL — ABNORMAL LOW (ref 0.7–4.0)
Neutrophils Relative %: 12 % — ABNORMAL LOW (ref 43–77)

## 2012-04-06 LAB — GLUCOSE, CAPILLARY
Glucose-Capillary: 239 mg/dL — ABNORMAL HIGH (ref 70–99)
Glucose-Capillary: 240 mg/dL — ABNORMAL HIGH (ref 70–99)
Glucose-Capillary: 286 mg/dL — ABNORMAL HIGH (ref 70–99)

## 2012-04-06 LAB — VANCOMYCIN, TROUGH: Vancomycin Tr: 14.8 ug/mL (ref 10.0–20.0)

## 2012-04-06 LAB — COMPREHENSIVE METABOLIC PANEL
ALT: 25 U/L (ref 0–53)
AST: 21 U/L (ref 0–37)
Alkaline Phosphatase: 37 U/L — ABNORMAL LOW (ref 39–117)
CO2: 23 mEq/L (ref 19–32)
Calcium: 8.9 mg/dL (ref 8.4–10.5)
GFR calc non Af Amer: 47 mL/min — ABNORMAL LOW (ref >90)
Glucose, Bld: 255 mg/dL — ABNORMAL HIGH (ref 70–99)
Potassium: 4.1 mEq/L (ref 3.5–5.1)
Sodium: 138 mEq/L (ref 135–145)

## 2012-04-06 LAB — CBC
Hemoglobin: 9.9 g/dL — ABNORMAL LOW (ref 13.0–17.0)
MCH: 29.6 pg (ref 26.0–34.0)
Platelets: 139 10*3/uL — ABNORMAL LOW (ref 150–400)
RBC: 3.34 MIL/uL — ABNORMAL LOW (ref 4.22–5.81)
WBC: 1 10*3/uL — CL (ref 4.0–10.5)

## 2012-04-06 MED ORDER — VALACYCLOVIR HCL 500 MG PO TABS
500.0000 mg | ORAL_TABLET | Freq: Two times a day (BID) | ORAL | Status: DC
  Administered 2012-04-06 – 2012-04-08 (×5): 500 mg via ORAL
  Filled 2012-04-06 (×7): qty 1

## 2012-04-06 MED ORDER — FILGRASTIM 480 MCG/1.6ML IJ SOLN
480.0000 ug | Freq: Every day | INTRAMUSCULAR | Status: DC
  Administered 2012-04-06: 480 ug via SUBCUTANEOUS
  Filled 2012-04-06 (×2): qty 1.6

## 2012-04-06 MED ORDER — INSULIN ASPART 100 UNIT/ML ~~LOC~~ SOLN
0.0000 [IU] | Freq: Three times a day (TID) | SUBCUTANEOUS | Status: DC
  Administered 2012-04-06: 11 [IU] via SUBCUTANEOUS
  Administered 2012-04-07 (×2): 4 [IU] via SUBCUTANEOUS
  Administered 2012-04-08: 7 [IU] via SUBCUTANEOUS
  Administered 2012-04-08: 4 [IU] via SUBCUTANEOUS

## 2012-04-06 MED ORDER — CLINDAMYCIN PHOSPHATE 1 % EX GEL
Freq: Two times a day (BID) | CUTANEOUS | Status: DC
  Administered 2012-04-06 – 2012-04-08 (×4): via TOPICAL
  Filled 2012-04-06 (×17): qty 30

## 2012-04-06 MED ORDER — INSULIN ASPART 100 UNIT/ML ~~LOC~~ SOLN
0.0000 [IU] | Freq: Every day | SUBCUTANEOUS | Status: DC
  Administered 2012-04-06: 3 [IU] via SUBCUTANEOUS
  Administered 2012-04-07: 5 [IU] via SUBCUTANEOUS

## 2012-04-06 MED ORDER — INSULIN ASPART 100 UNIT/ML ~~LOC~~ SOLN
7.0000 [IU] | Freq: Three times a day (TID) | SUBCUTANEOUS | Status: DC
  Administered 2012-04-06 – 2012-04-08 (×6): 7 [IU] via SUBCUTANEOUS

## 2012-04-06 MED ORDER — INSULIN GLARGINE 100 UNIT/ML ~~LOC~~ SOLN
20.0000 [IU] | Freq: Every day | SUBCUTANEOUS | Status: DC
  Administered 2012-04-06 – 2012-04-07 (×2): 20 [IU] via SUBCUTANEOUS

## 2012-04-06 NOTE — Progress Notes (Deleted)
ANTIBIOTIC CONSULT NOTE - FOLLOW UP  Pharmacy Consult for vancomycin Indication: neutropenia (due to immunosuppresive) R axillary abscess  No Known Allergies  Patient Measurements: Height: 5\' 9"  (175.3 cm) Weight: 230 lb 1 oz (104.356 kg) IBW/kg (Calculated) : 70.7  Adjusted Body Weight:   Vital Signs: Temp: 97.6 F (36.4 C) (05/24 1355) Temp src: Oral (05/24 1355) BP: 102/57 mmHg (05/24 1355) Pulse Rate: 73  (05/24 1355) Intake/Output from previous day: 05/23 0701 - 05/24 0700 In: 1467.5 [I.V.:1417.5; IV Piggyback:50] Out: 550 [Urine:550] Intake/Output from this shift:    Labs:  Basename 04/06/12 0720 04/05/12 0350 04/04/12 1956  WBC 1.0* 0.8* 0.7*  HGB 9.9* 9.4* 9.6*  PLT 139* 121* 111*  LABCREA -- -- --  CREATININE 1.41* 1.63* 1.78*   Estimated Creatinine Clearance: 52.3 ml/min (by C-G formula based on Cr of 1.41).  Basename 04/06/12 1901  VANCOTROUGH 14.8  VANCOPEAK --  Jake Michaelis --  GENTTROUGH --  GENTPEAK --  GENTRANDOM --  TOBRATROUGH --  TOBRAPEAK --  TOBRARND --  AMIKACINPEAK --  AMIKACINTROU --  AMIKACIN --     Microbiology: Recent Results (from the past 720 hour(s))  CULTURE, BLOOD (ROUTINE X 2)     Status: Normal (Preliminary result)   Collection Time   04/04/12  3:10 PM      Component Value Range Status Comment   Specimen Description BLOOD ARM RIGHT   Final    Special Requests BOTTLES DRAWN AEROBIC AND ANAEROBIC 10CC   Final    Culture  Setup Time [id number]   Final    Culture     Final    Value:        BLOOD CULTURE RECEIVED NO GROWTH TO DATE CULTURE WILL BE HELD FOR 5 DAYS BEFORE ISSUING A FINAL NEGATIVE REPORT   Report Status PENDING   Incomplete   CULTURE, BLOOD (ROUTINE X 2)     Status: Normal (Preliminary result)   Collection Time   04/04/12  3:20 PM      Component Value Range Status Comment   Specimen Description BLOOD HAND RIGHT   Final    Special Requests     Final    Value: BOTTLES DRAWN AEROBIC AND ANAEROBIC AERO 7CC,ANAE  3CC   Culture  Setup Time [id number]   Final    Culture     Final    Value:        BLOOD CULTURE RECEIVED NO GROWTH TO DATE CULTURE WILL BE HELD FOR 5 DAYS BEFORE ISSUING A FINAL NEGATIVE REPORT   Report Status PENDING   Incomplete     Anti-infectives     Start     Dose/Rate Route Frequency Ordered Stop   04/06/12 1530   valACYclovir (VALTREX) tablet 500 mg        500 mg Oral 2 times daily 04/06/12 1503 04/11/12 0959   04/05/12 2000   vancomycin (VANCOCIN) 1,500 mg in sodium chloride 0.9 % 500 mL IVPB        1,500 mg 250 mL/hr over 120 Minutes Intravenous Every 24 hours 04/04/12 1948     04/05/12 1600  piperacillin-tazobactam (ZOSYN) IVPB 3.375 g       3.375 g 12.5 mL/hr over 240 Minutes Intravenous 3 times per day 04/05/12 1547     04/04/12 1945   vancomycin (VANCOCIN) 2,000 mg in sodium chloride 0.9 % 500 mL IVPB  Status:  Discontinued        2,000 mg 250 mL/hr over 120 Minutes Intravenous To [facility] 04/04/12 1932 04/04/12 1934   04/04/12 1945   vancomycin (VANCOCIN) IVPB 1000 mg/200 mL premix        1,000 mg 200 mL/hr over 60 Minutes Intravenous NOW 04/04/12 1935 04/04/12 2225   04/04/12 1530   vancomycin (VANCOCIN) IVPB 1000 mg/200 mL premix        1,000 mg 200 mL/hr over 60 Minutes Intravenous  Once 04/04/12 1527 04/04/12 1733          Assessment: 78 yo M with R axillary cellulitis/skin lesions with neutropenia from Cytoxan.  Vancomycin trough reported as 14.8 tonight - at goal of ~15.  Renal function improving.  Note, patient has received 2g vanc x1 then 1500 mg iv x1 last pm so current trough is not truly reflective of steady-state level.  Goal of Therapy:  Vancomycin trough ~15  Plan:  Continue vancomycin 1500 mg IV q24h. F/u LOT, clinical progress. Recheck trough in a few days if treatment continues.  Sherlie Boyum L. Amada Jupiter, PharmD, [place] Clinical Pharmacist Pager: [phone] 04/06/2012 8:13 PM

## 2012-04-06 NOTE — Consult Note (Addendum)
ANTIBIOTIC CONSULT NOTE - FOLLOW UP  Pharmacy Consult for Vancomycin and Zosyn Indication: Neutropenia (due to immunosuppresive), Right Axillary Abscess  No Known Allergies  Patient Measurements: Height: 5\' 9"  (175.3 cm) Weight: 230 lb 1 oz (104.356 kg) IBW/kg (Calculated) : 70.7    Vital Signs: Temp Readings from Last 3 Encounters:  04/06/12 98.5 F (36.9 C) Oral  03/29/12 98.4 F (36.9 C) Oral  03/07/12 98.4 F (36.9 C) Oral    Intake/Output from previous day: 05/23 0701 - 05/24 0700 In: 1467.5 [I.V.:1417.5; IV Piggyback:50] Out: 550 [Urine:550]  Labs:  Basename 04/06/12 0720 04/05/12 0350 04/04/12 1956  WBC 1.0* 0.8* 0.7*  HGB 9.9* 9.4* 9.6*  PLT 139* 121* 111*  LABCREA -- -- --  CREATININE 1.41* 1.63* 1.78*   Estimated Creatinine Clearance: 52.3 ml/min (by C-G formula based on Cr of 1.41).    Microbiology: Recent Results (from the past 720 hour(s))  CULTURE, BLOOD (ROUTINE X 2)     Status: Normal (Preliminary result)   Collection Time   04/04/12  3:10 PM      Component Value Range Status Comment   Specimen Description BLOOD ARM RIGHT   Final    Special Requests BOTTLES DRAWN AEROBIC AND ANAEROBIC 10CC   Final    Culture  Setup Time [id number]   Final    Culture     Final    Value:        BLOOD CULTURE RECEIVED NO GROWTH TO DATE CULTURE WILL BE HELD FOR 5 DAYS BEFORE ISSUING A FINAL NEGATIVE REPORT   Report Status PENDING   Incomplete   CULTURE, BLOOD (ROUTINE X 2)     Status: Normal (Preliminary result)   Collection Time   04/04/12  3:20 PM      Component Value Range Status Comment   Specimen Description BLOOD HAND RIGHT   Final    Special Requests     Final    Value: BOTTLES DRAWN AEROBIC AND ANAEROBIC AERO 7CC,ANAE 3CC   Culture  Setup Time [id number]   Final    Culture     Final    Value:        BLOOD CULTURE RECEIVED NO GROWTH TO DATE CULTURE WILL BE HELD FOR 5 DAYS BEFORE ISSUING A FINAL NEGATIVE REPORT   Report Status PENDING   Incomplete      Anti-infectives Anti-infectives     Start     Dose/Rate Route Frequency Ordered Stop   04/05/12 2000   vancomycin (VANCOCIN) 1,500 mg in sodium chloride 0.9 % 500 mL IVPB        1,500 mg 250 mL/hr over 120 Minutes Intravenous Every 24 hours 04/04/12 1948     04/05/12 1600  piperacillin-tazobactam (ZOSYN) IVPB 3.375 g       3.375 g 12.5 mL/hr over 240 Minutes Intravenous 3 times per day 04/05/12 1547     04/04/12 1945   vancomycin (VANCOCIN) 2,000 mg in sodium chloride 0.9 % 500 mL IVPB  Status:  Discontinued        2,000 mg 250 mL/hr over 120 Minutes Intravenous To [facility] 04/04/12 1932 04/04/12 1934   04/04/12 1945   vancomycin (VANCOCIN) IVPB 1000 mg/200 mL premix        1,000 mg 200 mL/hr over 60 Minutes Intravenous NOW 04/04/12 1935 04/04/12 2225   04/04/12 1530   vancomycin (VANCOCIN) IVPB 1000 mg/200 mL premix        1,000 mg 200 mL/hr over 60 Minutes Intravenous  Once  04/04/12 1527 04/04/12 1733          Assessment:  Pt is a 78 y.o. yo male with a PMHx of DM, HTN, ANCA vasculitis, and renal failure who was admitted on 04/04/2012 for right axillary cellulitis/skin lesions with neutropenia from Cytoxan.  Current WBC 1, result of Cytoxan therapy for ANCA vasculitis.   Patient is afebrile.  Renal function is improving.  Scr 1.63  >>  1.41.   Est CrCl  35  >>  52.  Will need to check Vancomycin level to confirm adequate coverage.  Goal of Therapy:   Vancomycin trough level 10-15 mcg/ml  Plan:   Continue Vancomycin 1500 mg IV q 24 hours--next dose 2000 pm tonight.  Vancomycin trough prior to tonights dose.  Continue Zosyn as ordered.  Zale Marcotte, Craig Guess, Pharm.D. 04/06/2012 1:31 PM  ADDENDUM:  5/24 With neutropenia, current WBC 1, will adjust Vancomycin Goal to 15 - 20 mcg/ml.  Rigo Letts, Craig Guess, Pharm.D. 04/06/2012 1:50 PM

## 2012-04-06 NOTE — Progress Notes (Signed)
INFECTIOUS DISEASE PROGRESS NOTE  ID: Ryan Jimenez is a 78 y.o. male with   Principal Problem:  *Abscess of axillary region Active Problems:  Hypercholesterolemia  Hypertension  Renal failure  Diabetes mellitus type II  Neutropenia  Pancytopenia due to chemotherapy  Anemia  Generalized weakness  Physical deconditioning  Hidradenitis suppurativa of right axilla  Subjective: Without complaint, feels better  Abtx:  Anti-infectives     Start     Dose/Rate Route Frequency Ordered Stop   04/05/12 2000   vancomycin (VANCOCIN) 1,500 mg in sodium chloride 0.9 % 500 mL IVPB        1,500 mg 250 mL/hr over 120 Minutes Intravenous Every 24 hours 04/04/12 1948     04/05/12 1600  piperacillin-tazobactam (ZOSYN) IVPB 3.375 g       3.375 g 12.5 mL/hr over 240 Minutes Intravenous 3 times per day 04/05/12 1547     04/04/12 1945   vancomycin (VANCOCIN) 2,000 mg in sodium chloride 0.9 % 500 mL IVPB  Status:  Discontinued        2,000 mg 250 mL/hr over 120 Minutes Intravenous To [facility] 04/04/12 1932 04/04/12 1934   04/04/12 1945   vancomycin (VANCOCIN) IVPB 1000 mg/200 mL premix        1,000 mg 200 mL/hr over 60 Minutes Intravenous NOW 04/04/12 1935 04/04/12 2225   04/04/12 1530   vancomycin (VANCOCIN) IVPB 1000 mg/200 mL premix        1,000 mg 200 mL/hr over 60 Minutes Intravenous  Once 04/04/12 1527 04/04/12 1733          Medications:  Scheduled:   . allopurinol  100 mg Oral Daily  . amLODipine  10 mg Oral Daily  . aspirin EC  81 mg Oral Daily  . carvedilol  12.5 mg Oral BID WC  . clindamycin   Topical QHS  . dutasteride  0.5 mg Oral Daily  . feeding supplement  237 mL Oral Q24H  . furosemide  40 mg Oral Daily  . heparin  5,000 Units Subcutaneous Q8H  . insulin aspart  0-9 Units Subcutaneous TID WC  . insulin aspart  5 Units Subcutaneous TID WC  . insulin glargine  15 Units Subcutaneous QHS  . megestrol  400 mg Oral Daily  . piperacillin-tazobactam  (ZOSYN)  IV  3.375 g Intravenous Q8H  . potassium chloride  40 mEq Oral Q6H  . predniSONE  10 mg Oral Daily  . sevelamer  800 mg Oral TID WC  . Tamsulosin HCl  0.4 mg Oral Daily  . vancomycin  1,500 mg Intravenous Q24H  . DISCONTD: clindamycin  1 Applicatorful Topical QHS  . DISCONTD: hydrALAZINE  100 mg Oral BID  . DISCONTD: mupirocin cream   Topical BID  . DISCONTD: potassium chloride  40 mEq Oral BID  . DISCONTD: Tamsulosin HCl  0.4 mg Oral QHS    Objective: Vital signs in last 24 hours: Temp:  [97.6 F (36.4 C)-98.5 F (36.9 C)] 97.6 F (36.4 C) (05/24 1355) Pulse Rate:  [73-74] 73  (05/24 1355) Resp:  [20] 20  (05/24 1355) BP: (102-122)/(57-64) 102/57 mmHg (05/24 1355) SpO2:  [93 %-95 %] 95 % (05/24 1355) Weight:  [104.356 kg (230 lb 1 oz)] 104.356 kg (230 lb 1 oz) (05/24 0500)   General appearance: alert, cooperative and no distress Throat: superficial ulcers on roof of mouth Chest wall: decreased tenderness to touch. no d/c expressable. swelling unchanged.   Lab Results  Basename 04/06/12 0720 04/05/12  0350  WBC 1.0* 0.8*  HGB 9.9* 9.4*  HCT 29.2* 27.4*  NA 138 135  K 4.1 2.9*  CL 105 100  CO2 23 24  BUN 23 29*  CREATININE 1.41* 1.63*  GLU -- --   Liver Panel  Basename 04/06/12 0720 04/05/12 0350  PROT 5.3* 5.1*  ALBUMIN 2.2* 2.1*  AST 21 18  ALT 25 19  ALKPHOS 37* 38*  BILITOT 0.4 0.4  BILIDIR -- --  IBILI -- --   Sedimentation Rate No results found for this basename: ESRSEDRATE in the last 72 hours C-Reactive Protein No results found for this basename: CRP:2 in the last 72 hours  Microbiology: Recent Results (from the past 240 hour(s))  CULTURE, BLOOD (ROUTINE X 2)     Status: Normal (Preliminary result)   Collection Time   04/04/12  3:10 PM      Component Value Range Status Comment   Specimen Description BLOOD ARM RIGHT   Final    Special Requests BOTTLES DRAWN AEROBIC AND ANAEROBIC 10CC   Final    Culture  Setup Time [id number]   Final     Culture     Final    Value:        BLOOD CULTURE RECEIVED NO GROWTH TO DATE CULTURE WILL BE HELD FOR 5 DAYS BEFORE ISSUING A FINAL NEGATIVE REPORT   Report Status PENDING   Incomplete   CULTURE, BLOOD (ROUTINE X 2)     Status: Normal (Preliminary result)   Collection Time   04/04/12  3:20 PM      Component Value Range Status Comment   Specimen Description BLOOD HAND RIGHT   Final    Special Requests     Final    Value: BOTTLES DRAWN AEROBIC AND ANAEROBIC AERO 7CC,ANAE 3CC   Culture  Setup Time [id number]   Final    Culture     Final    Value:        BLOOD CULTURE RECEIVED NO GROWTH TO DATE CULTURE WILL BE HELD FOR 5 DAYS BEFORE ISSUING A FINAL NEGATIVE REPORT   Report Status PENDING   Incomplete     Studies/Results: Ct Shoulder Right Wo Contrast  04/06/2012  *RADIOLOGY REPORT*  Clinical Data: Right axillary pain, erythema and swelling. Question abscess.  CT OF THE RIGHT SHOULDER WITHOUT CONTRAST  Technique:  Multidetector CT imaging was performed according to the standard protocol. Multiplanar CT image reconstructions were also generated.  Comparison: None.  Findings: There is soft tissue stranding in the perivascular fat of the right axilla which extends posteriorly along the right chest wall.  No focal fluid collection is demonstrated without contrast. There is no lymphadenopathy.  Vascular assessment is limited without contrast.  The right shoulder musculature appears normal.  No osseous abnormalities are identified.  The visualized right lung appears normal.  IMPRESSION:  1.  Nonspecific soft tissue stranding in the right axilla (consistent with cellulitis in this clinical context). 2.  No evidence of abscess.  Original Report Authenticated By: Vivia Ewing, M.D.     Assessment/Plan: Axillary cellulitis Oral Ulcers Neutropenia (anc 100) Day 3 anbx (vanco/zosyn, clinda gel) No change in anbx Await viral cx from roof of mouth  Will start valtrex, multiple possible  etiologies. I have no problem with him receiving neupogen,   Dr Tommy Medal available if questions over w/e.  Kiernan Rumpf Infectious Diseases [id number] 04/06/2012, 2:55 PM   LOS: 2 days

## 2012-04-06 NOTE — Progress Notes (Signed)
[facility] - PROGRESS NOTE Resident Note   Please see below for attending addendum to resident note.  Subjective:   He reports feeling better- decreased arm pain but lot of itching. Was able to move his arm.   Objective:    Vital Signs:   Temp:  [98.5 F (36.9 C)] 98.5 F (36.9 C) (05/24 0700) Pulse Rate:  [73-74] 73  (05/24 0700) Resp:  [18-20] 20  (05/24 0700) BP: (105-122)/(64-65) 122/64 mmHg (05/24 0700) SpO2:  [93 %-98 %] 93 % (05/24 0700) Weight:  [230 lb 1 oz (104.356 kg)] 230 lb 1 oz (104.356 kg) (05/24 0500) Last BM Date: 04/04/12  24-hour weight change: Weight change: 2 lb 9 oz (1.162 kg)  Weight trends: Filed Weights   04/04/12 1948 04/04/12 2010 04/06/12 0500  Weight: 227 lb 8 oz (103.193 kg) 228 lb 8 oz (103.647 kg) 230 lb 1 oz (104.356 kg)    Intake/Output:  05/23 0701 - 05/24 0700 In: 1467.5 [I.V.:1417.5; IV Piggyback:50] Out: 550 [Urine:550] + 200  Physical Exam: General: Vital signs reviewed and noted. Well-developed, well-nourished, in no acute distress; alert, appropriate and cooperative throughout examination.  Lungs:  Normal respiratory effort. Clear to auscultation BL without crackles or wheezes.  Heart: RRR. S1 and S2 normal without gallop, murmur, or rubs.  Abdomen:  BS normoactive. Soft, Nondistended, non-tender.  No masses or organomegaly.  Extremities: 1+ pretibial edema., R axillary swelling and tenderness with some skin breakdown an blisters, oral ulcers in the right lateral side and hard palate. Non erythematous maculopapular smaller lesions along inside of left upper arm, and some in intertriginous retions     Labs: Basic Metabolic Panel:  Lab [id number] 04/05/12 0350 04/04/12 1956 04/04/12 1341  NA 138 135 -- 131*  K 4.1 2.9* -- 3.0*  CL 105 100 -- 96  CO2 23 24 -- 21  GLUCOSE 255* 309* -- 311*  BUN 23 29* -- 29*  CREATININE 1.41* 1.63* 1.78* 1.78*  CALCIUM 8.9 8.2* -- 8.1*  MG 1.8 -- -- --  PHOS -- -- -- --      Liver Function Tests:  Lab 04/05/12 0350  AST 18  ALT 19  ALKPHOS 38*  BILITOT 0.4  PROT 5.1*  ALBUMIN 2.1*    CBC:  Lab 04/06/12 0720 04/05/12 0350 04/04/12 1956 04/04/12 1341  WBC 1.0* 0.8* 0.7* 0.9*  NEUTROABS -- -- -- 0.2*  HGB 9.9* 9.4* 9.6* 10.0*  HCT 29.2* 27.4* 28.3* 29.4*  MCV 87.4 86.7 87.9 87.0  PLT 139* 121* 111* 121*    Cardiac Enzymes:  Lab 04/05/12 1120 04/05/12 0350 04/04/12 1956  CKTOTAL 46 67 89  CKMB 1.9 2.3 2.2  CKMBINDEX -- -- --  TROPONINI <0.30 <0.30 <0.30     CBG:  Lab 04/05/12 2202 04/05/12 1658 04/05/12 1122 04/05/12 0709 04/05/12 0237  GLUCAP 318* 316* 284* 267* 335*    Microbiology: Results for orders placed during the hospital encounter of 04/04/12  CULTURE, BLOOD (ROUTINE X 2)     Status: Normal (Preliminary result)   Collection Time   04/04/12  3:10 PM      Component Value Range Status Comment   Specimen Description BLOOD ARM RIGHT   Final    Special Requests BOTTLES DRAWN AEROBIC AND ANAEROBIC 10CC   Final    Culture  Setup Time [id number]   Final    Culture     Final    Value:        BLOOD CULTURE RECEIVED  NO GROWTH TO DATE CULTURE WILL BE HELD FOR 5 DAYS BEFORE ISSUING A FINAL NEGATIVE REPORT   Report Status PENDING   Incomplete   CULTURE, BLOOD (ROUTINE X 2)     Status: Normal (Preliminary result)   Collection Time   04/04/12  3:20 PM      Component Value Range Status Comment   Specimen Description BLOOD HAND RIGHT   Final    Special Requests     Final    Value: BOTTLES DRAWN AEROBIC AND ANAEROBIC AERO 7CC,ANAE 3CC   Culture  Setup Time [id number]   Final    Culture     Final    Value:        BLOOD CULTURE RECEIVED NO GROWTH TO DATE CULTURE WILL BE HELD FOR 5 DAYS BEFORE ISSUING A FINAL NEGATIVE REPORT   Report Status PENDING   Incomplete      Urinalysis:  Basename 04/04/12 1626  COLORURINE YELLOW  LABSPEC 1.013  PHURINE 5.5  GLUCOSEU NEGATIVE  HGBUR NEGATIVE  BILIRUBINUR NEGATIVE  KETONESUR NEGATIVE   PROTEINUR 30*  UROBILINOGEN 1.0  NITRITE NEGATIVE  LEUKOCYTESUR NEGATIVE    Iron/TIBC/Ferritin    Component Value Date/Time   IRON 26* 03/07/2012 1301   TIBC 167* 03/07/2012 1301   FERRITIN 395* 01/11/2012 1325       Imaging: Ct Shoulder Right Wo Contrast  04/06/2012  *RADIOLOGY REPORT*  Clinical Data: Right axillary pain, erythema and swelling. Question abscess.  CT OF THE RIGHT SHOULDER WITHOUT CONTRAST  Technique:  Multidetector CT imaging was performed according to the standard protocol. Multiplanar CT image reconstructions were also generated.  Comparison: None.  Findings: There is soft tissue stranding in the perivascular fat of the right axilla which extends posteriorly along the right chest wall.  No focal fluid collection is demonstrated without contrast. There is no lymphadenopathy.  Vascular assessment is limited without contrast.  The right shoulder musculature appears normal.  No osseous abnormalities are identified.  The visualized right lung appears normal.  IMPRESSION:  1.  Nonspecific soft tissue stranding in the right axilla (consistent with cellulitis in this clinical context). 2.  No evidence of abscess.  Original Report Authenticated By: Vivia Ewing, M.D.   Dg Chest Port 1 View  04/04/2012  *RADIOLOGY REPORT*  Clinical Data: Shortness of breath and weakness.  PORTABLE CHEST - 1 VIEW  Comparison: 11/26/2011.  Findings: Trachea is midline.  Heart are stable. Minimal subsegmental atelectasis at the medial right lung base.  Lungs are otherwise clear.  No pleural fluid.  IMPRESSION: No acute findings.  Original Report Authenticated By: Luretha Rued, M.D.      Medications:    Infusions:    . 0.9 % NaCl with KCl 40 mEq / L 75 mL/hr at 04/05/12 1801    Scheduled Medications:    . allopurinol  100 mg Oral Daily  . amLODipine  10 mg Oral Daily  . aspirin EC  81 mg Oral Daily  . carvedilol  12.5 mg Oral BID WC  . clindamycin   Topical QHS  . dutasteride   0.5 mg Oral Daily  . feeding supplement  237 mL Oral Q24H  . furosemide  40 mg Oral Daily  . heparin  5,000 Units Subcutaneous Q8H  . insulin aspart  0-9 Units Subcutaneous TID WC  . insulin aspart  5 Units Subcutaneous TID WC  . insulin glargine  15 Units Subcutaneous QHS  . megestrol  400 mg Oral Daily  . piperacillin-tazobactam (ZOSYN)  IV  3.375 g Intravenous Q8H  . potassium chloride  40 mEq Oral Q6H  . predniSONE  10 mg Oral Daily  . sevelamer  800 mg Oral TID WC  . Tamsulosin HCl  0.4 mg Oral Daily  . vancomycin  1,500 mg Intravenous Q24H  . DISCONTD: clindamycin  1 Applicatorful Topical QHS  . DISCONTD: hydrALAZINE  100 mg Oral BID  . DISCONTD: insulin glargine  10 Units Subcutaneous QHS  . DISCONTD: mupirocin cream   Topical BID  . DISCONTD: potassium chloride  40 mEq Oral BID  . DISCONTD: Tamsulosin HCl  0.4 mg Oral QHS    PRN Medications: acetaminophen, acetaminophen, senna-docusate   Assessment/ Plan:    Pt is a 78 y.o. yo male with a PMHx of DM, HTN, ANCA vasculitis  Renal failure who was admitted on 04/04/2012 for right axillary cleeulitis/skin lesions with neutropenia. He is on cytoxan for his ANCA vasculitis renal failure and follows up with Dr. Posey Pronto as outpatient. Renal service is consulted to manage his immunosuppressive meds in the setting of neutropenia. Cytoxan is on hold.  1. Neutropenia: Likely secondary to cytoxan. He presented with WBC- 0.9 with ANC of 200. Last WBC from 1 month ago was 4300. His WBC  Is 1000 today which has improved from yesterday. He is having some low grade fevers - the source is R axillary cellulitis. He has also developed new oral ulcers. CT scan of  Axilla  is consistent with cellulitis. Per primary team and ID .  -Continue vancomycin and zosyn  - F/U viral cultures from axilla and mouth. -check with Dr. Johnnye Sima - ? neupogen ? Addition of antiviral?   2. 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 He has a new baseline~ 1.7. He presented to the hospital at his baseline. Cr continues to be stable( 1.4 today). He was getting treated with cyclophosphamide and prednisone. He was on 200mg  daily of cytoxan and steroids taper in the outpatient and was currently taking 10 mg of prednisone daily. His UA shows few squamous epithelial cells and hyaline casts. Fair UOP. - Continue to  hold cytoxan and continuing daily prednisone  - Continue to monitor his kidney function.  3. R axillary cellulitis: Likely Hidradenitis suppurativa.Fevers are defervescent.CT scan of  Axilla  is consistent with cellulitis. - Abs per ID and primary  3. Hypokalemia: Resolved. K- 4.1 likely secondary to lasix. Mg levels- 1.8  4. Anemia:His hemoglobin is ranging in 9 with baseline being 10.5-11. Iron -26 and ferritin in 300's.For his anemia of chronic disease, he is ongoing Procrit therapy with 10,000 q. monthly with the last dose given May of 2013.  - Hold cytoxan. - May consider starting oral iron as well (once infection clarified and controlled)   5.Hypertension : BP stable.  ON Norvasc, Coreg, Lasix , flomax.  - Hydralazine was d/c on 5/23.  5.Diabetes mellitus type II: HbA1C- 7.4 on 5/22. CBGs are running high in the setting of prednisone and acute illness.  -Continue Lantus and NovoLog  -Per primary   6. 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 S/p ergocalciferol supplementation. On Renvela  7. Dispo: Pending until further clinical improvement .      Length of Stay: 2 days  Patient history and plan of care reviewed with attending, Dr. Lorrene Reid.   Pedro Earls, MD  PGYII, Internal Medicine Resident 04/06/2012, 8:18 AM I have seen and examined  this patient and agree with plan with highlighted additions.  Check with ID re question of neupogen and possible addition of empiric antiviral Rx.  Holding cytoxan.  Renal function  stable.Jamal Maes B,MD 04/06/2012 1:12 PM

## 2012-04-06 NOTE — Progress Notes (Signed)
[facility] Progress Note  04/06/2012   Subjective: Pt reports that he is having much less discomfort in right axillary area.  He denies fever or chills.    Objective:  Vital signs in last 24 hours: Filed Vitals:   04/05/12 2248 04/06/12 0500 04/06/12 0700 04/06/12 1355  BP: 105/64  122/64 102/57  Pulse: 74  73 73  Temp: 98.5 F (36.9 C)  98.5 F (36.9 C) 97.6 F (36.4 C)  TempSrc: Oral  Oral Oral  Resp: 20  20 20   Height:      Weight:  104.356 kg (230 lb 1 oz)    SpO2: 94%  93% 95%   Weight change: 1.162 kg (2 lb 9 oz)  Intake/Output Summary (Last 24 hours) at 04/06/12 1520 Last data filed at 04/06/12 1300  Gross per 24 hour  Intake 2067.5 ml  Output    750 ml  Net 1317.5 ml   Lab Results  Component Value Date   HGBA1C 7.4* 04/04/2012   HGBA1C 6.7* 11/24/2011   Lab Results  Component Value Date   CREATININE 1.41* 04/06/2012    Review of Systems As above, otherwise all reviewed and reported negative  Physical Exam Vital signs reviewed and noted. Well-developed, well-nourished, in no acute distress; alert, appropriate and cooperative throughout examination.  Heent:oral ulcers in the right lateral side and hard palate Lungs: Normal respiratory effort. Clear to auscultation BL without crackles or wheezes.  Heart: RRR. S1 and S2 normal without gallop, murmur, or rubs.  Abdomen: BS normoactive. Soft, Nondistended, non-tender. No masses or organomegaly.  Extremities: 1+ pretibial edema., R axillary swelling and tenderness with some skin breakdown and blisters, Non erythematous maculopapular smaller lesions along inside of left upper arm, and some in intertriginous regions   Lab Results: Results for orders placed during the hospital encounter of 04/04/12 (from the past 24 hour(s))  GLUCOSE, CAPILLARY     Status: Abnormal   Collection Time   04/05/12  4:58 PM      Component Value Range   Glucose-Capillary 316 (*) 70 - 99 (mg/dL)  GLUCOSE, CAPILLARY     Status:  Abnormal   Collection Time   04/05/12 10:02 PM      Component Value Range   Glucose-Capillary 318 (*) 70 - 99 (mg/dL)  GLUCOSE, CAPILLARY     Status: Abnormal   Collection Time   04/06/12  7:07 AM      Component Value Range   Glucose-Capillary 239 (*) 70 - 99 (mg/dL)   Comment 1 Documented in Chart     Comment 2 Notify RN    CBC     Status: Abnormal   Collection Time   04/06/12  7:20 AM      Component Value Range   WBC 1.0 (*) 4.0 - 10.5 (K/uL)   RBC 3.34 (*) 4.22 - 5.81 (MIL/uL)   Hemoglobin 9.9 (*) 13.0 - 17.0 (g/dL)   HCT 29.2 (*) 39.0 - 52.0 (%)   MCV 87.4  78.0 - 100.0 (fL)   MCH 29.6  26.0 - 34.0 (pg)   MCHC 33.9  30.0 - 36.0 (g/dL)   RDW 17.5 (*) 11.5 - 15.5 (%)   Platelets 139 (*) 150 - 400 (K/uL)  COMPREHENSIVE METABOLIC PANEL     Status: Abnormal   Collection Time   04/06/12  7:20 AM      Component Value Range   Sodium 138  135 - 145 (mEq/L)   Potassium 4.1  3.5 - 5.1 (mEq/L)  Chloride 105  96 - 112 (mEq/L)   CO2 23  19 - 32 (mEq/L)   Glucose, Bld 255 (*) 70 - 99 (mg/dL)   BUN 23  6 - 23 (mg/dL)   Creatinine, Ser 1.41 (*) 0.50 - 1.35 (mg/dL)   Calcium 8.9  8.4 - 10.5 (mg/dL)   Total Protein 5.3 (*) 6.0 - 8.3 (g/dL)   Albumin 2.2 (*) 3.5 - 5.2 (g/dL)   AST 21  0 - 37 (U/L)   ALT 25  0 - 53 (U/L)   Alkaline Phosphatase 37 (*) 39 - 117 (U/L)   Total Bilirubin 0.4  0.3 - 1.2 (mg/dL)   GFR calc non Af Amer 47 (*) >90 (mL/min)   GFR calc Af Amer 54 (*) >90 (mL/min)  MAGNESIUM     Status: Normal   Collection Time   04/06/12  7:20 AM      Component Value Range   Magnesium 1.8  1.5 - 2.5 (mg/dL)  DIFFERENTIAL     Status: Abnormal   Collection Time   04/06/12  7:20 AM      Component Value Range   Neutro Abs 0.1 (*) 1.7 - 7.7 (K/uL)   Lymphs Abs 0.4 (*) 0.7 - 4.0 (K/uL)   Monocytes Absolute 0.3  0.1 - 1.0 (K/uL)   Eosinophils Absolute 0.2  0.0 - 0.7 (K/uL)   Basophils Absolute 0.0  0.0 - 0.1 (K/uL)   Neutrophils Relative 12 (*) 43 - 77 (%)   Lymphocytes Relative  40  12 - 46 (%)   Monocytes Relative 27 (*) 3 - 12 (%)   Eosinophils Relative 18 (*) 0 - 5 (%)   Basophils Relative 3 (*) 0 - 1 (%)   Band Neutrophils 0  0 - 10 (%)   Metamyelocytes Relative 0     Myelocytes 0     Promyelocytes Absolute 0     Blasts 0     nRBC 0  0 (/100 WBC)   RBC Morphology ELLIPTOCYTES    GLUCOSE, CAPILLARY     Status: Abnormal   Collection Time   04/06/12 11:16 AM      Component Value Range   Glucose-Capillary 240 (*) 70 - 99 (mg/dL)   Comment 1 Documented in Chart     Comment 2 Notify RN      Micro Results: Recent Results (from the past 240 hour(s))  CULTURE, BLOOD (ROUTINE X 2)     Status: Normal (Preliminary result)   Collection Time   04/04/12  3:10 PM      Component Value Range Status Comment   Specimen Description BLOOD ARM RIGHT   Final    Special Requests BOTTLES DRAWN AEROBIC AND ANAEROBIC 10CC   Final    Culture  Setup Time [id number]   Final    Culture     Final    Value:        BLOOD CULTURE RECEIVED NO GROWTH TO DATE CULTURE WILL BE HELD FOR 5 DAYS BEFORE ISSUING A FINAL NEGATIVE REPORT   Report Status PENDING   Incomplete   CULTURE, BLOOD (ROUTINE X 2)     Status: Normal (Preliminary result)   Collection Time   04/04/12  3:20 PM      Component Value Range Status Comment   Specimen Description BLOOD HAND RIGHT   Final    Special Requests     Final    Value: BOTTLES DRAWN AEROBIC AND ANAEROBIC [place]   Culture  Setup Time [id number]  Final    Culture     Final    Value:        BLOOD CULTURE RECEIVED NO GROWTH TO DATE CULTURE WILL BE HELD FOR 5 DAYS BEFORE ISSUING A FINAL NEGATIVE REPORT   Report Status PENDING   Incomplete     Medications:  Scheduled Meds:   . allopurinol  100 mg Oral Daily  . amLODipine  10 mg Oral Daily  . aspirin EC  81 mg Oral Daily  . carvedilol  12.5 mg Oral BID WC  . clindamycin   Topical QHS  . dutasteride  0.5 mg Oral Daily  . feeding supplement  237 mL Oral Q24H  . furosemide  40 mg Oral  Daily  . heparin  5,000 Units Subcutaneous Q8H  . insulin aspart  0-9 Units Subcutaneous TID WC  . insulin aspart  5 Units Subcutaneous TID WC  . insulin glargine  15 Units Subcutaneous QHS  . megestrol  400 mg Oral Daily  . piperacillin-tazobactam (ZOSYN)  IV  3.375 g Intravenous Q8H  . potassium chloride  40 mEq Oral Q6H  . predniSONE  10 mg Oral Daily  . sevelamer  800 mg Oral TID WC  . Tamsulosin HCl  0.4 mg Oral Daily  . valACYclovir  500 mg Oral BID  . vancomycin  1,500 mg Intravenous Q24H  . DISCONTD: clindamycin  1 Applicatorful Topical QHS  . DISCONTD: hydrALAZINE  100 mg Oral BID  . DISCONTD: mupirocin cream   Topical BID  . DISCONTD: potassium chloride  40 mEq Oral BID  . DISCONTD: Tamsulosin HCl  0.4 mg Oral QHS   Continuous Infusions:   . 0.9 % NaCl with KCl 40 mEq / L 75 mL/hr at 04/05/12 1801   PRN Meds:.acetaminophen, acetaminophen, senna-docusate  Assessment/Plan: 1. Neutropenia: Likely secondary to cytoxan. Improving slightly, ID/renal OK with neupogen, will order, Continue vancomycin and zosyn for now. Valtrex started for viral treatment.  - F/U viral cultures from axilla and mouth.   2. 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 He has a new baseline~ 1.7. - Continue to hold cytoxan and continuing daily prednisone,Continue to monitor his kidney function.  Nephrology team following.   3. R axillary cellulitis: Clinically improving. Likely Hidradenitis suppurativa. CT scan of Axilla is consistent with cellulitis.   3. Hypokalemia: Resolved. K- 4.1 likely secondary to lasix. Mg levels- 1.8   4. Anemia:His hemoglobin is ranging in 9 with baseline being 10.5-11. Iron -26 and ferritin in 300's.For his anemia of chronic disease, he is ongoing Procrit therapy with 10,000 q. monthly with the last dose given May of 2013.  - Holding cytoxan.  -  May consider starting oral iron (will defer to nephrology team)  5.Hypertension : BP stable. ON Norvasc, Coreg, Lasix , flomax.   5.Diabetes mellitus type II: HbA1C- 7.4 on 5/22. Uncontrolled.  Intensify basal bolus therapy.  Continue Lantus and NovoLog - see orders.   6. 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 S/p ergocalciferol supplementation. On Renvela  7. Dispo: Home when more stable.     LOS: 2 days   Wilena Tyndall 04/06/2012, 3:20 PM   Murlean Iba, MD, CDE, FAAFP [facility] [place], [place]

## 2012-04-06 NOTE — Progress Notes (Addendum)
ANTIBIOTIC CONSULT NOTE - FOLLOW UP  Pharmacy Consult for vancomycin Indication: R axillary abscess  No Known Allergies  Patient Measurements: Height: 5\' 9"  (175.3 cm) Weight: 230 lb 1 oz (104.356 kg) IBW/kg (Calculated) : 70.7   Vital Signs: Temp: 97.6 F (36.4 C) (05/24 1355) Temp src: Oral (05/24 1355) BP: 102/57 mmHg (05/24 1355) Pulse Rate: 73  (05/24 1355) Intake/Output from previous day: 05/23 0701 - 05/24 0700 In: 1467.5 [I.V.:1417.5; IV Piggyback:50] Out: 550 [Urine:550] Intake/Output from this shift:    Labs:  Basename 04/06/12 0720 04/05/12 0350 04/04/12 1956  WBC 1.0* 0.8* 0.7*  HGB 9.9* 9.4* 9.6*  PLT 139* 121* 111*  LABCREA -- -- --  CREATININE 1.41* 1.63* 1.78*   Estimated Creatinine Clearance: 52.3 ml/min (by C-G formula based on Cr of 1.41).  Basename 04/06/12 1901  VANCOTROUGH 14.8  VANCOPEAK --  Jake Michaelis --  GENTTROUGH --  GENTPEAK --  GENTRANDOM --  TOBRATROUGH --  TOBRAPEAK --  TOBRARND --  AMIKACINPEAK --  AMIKACINTROU --  AMIKACIN --     Microbiology: Recent Results (from the past 65 hour(s))  CULTURE, BLOOD (ROUTINE X 2)     Status: Normal (Preliminary result)   Collection Time   04/04/12  3:10 PM      Component Value Range Status Comment   Specimen Description BLOOD ARM RIGHT   Final    Special Requests BOTTLES DRAWN AEROBIC AND ANAEROBIC 10CC   Final    Culture  Setup Time [id number]   Final    Culture     Final    Value:        BLOOD CULTURE RECEIVED NO GROWTH TO DATE CULTURE WILL BE HELD FOR 5 DAYS BEFORE ISSUING A FINAL NEGATIVE REPORT   Report Status PENDING   Incomplete   CULTURE, BLOOD (ROUTINE X 2)     Status: Normal (Preliminary result)   Collection Time   04/04/12  3:20 PM      Component Value Range Status Comment   Specimen Description BLOOD HAND RIGHT   Final    Special Requests     Final    Value: BOTTLES DRAWN AEROBIC AND ANAEROBIC AERO 7CC,ANAE 3CC   Culture  Setup Time [id number]   Final    Culture      Final    Value:        BLOOD CULTURE RECEIVED NO GROWTH TO DATE CULTURE WILL BE HELD FOR 5 DAYS BEFORE ISSUING A FINAL NEGATIVE REPORT   Report Status PENDING   Incomplete     Anti-infectives     Start     Dose/Rate Route Frequency Ordered Stop   04/06/12 1530   valACYclovir (VALTREX) tablet 500 mg        500 mg Oral 2 times daily 04/06/12 1503 04/11/12 0959   04/05/12 2000   vancomycin (VANCOCIN) 1,500 mg in sodium chloride 0.9 % 500 mL IVPB        1,500 mg 250 mL/hr over 120 Minutes Intravenous Every 24 hours 04/04/12 1948     04/05/12 1600  piperacillin-tazobactam (ZOSYN) IVPB 3.375 g       3.375 g 12.5 mL/hr over 240 Minutes Intravenous 3 times per day 04/05/12 1547     04/04/12 1945   vancomycin (VANCOCIN) 2,000 mg in sodium chloride 0.9 % 500 mL IVPB  Status:  Discontinued        2,000 mg 250 mL/hr over 120 Minutes Intravenous To [facility] 04/04/12 1932 04/04/12 1934   04/04/12  1945   vancomycin (VANCOCIN) IVPB 1000 mg/200 mL premix        1,000 mg 200 mL/hr over 60 Minutes Intravenous NOW 04/04/12 1935 04/04/12 2225   04/04/12 1530   vancomycin (VANCOCIN) IVPB 1000 mg/200 mL premix        1,000 mg 200 mL/hr over 60 Minutes Intravenous  Once 04/04/12 1527 04/04/12 1733          Assessment: 78 yo male on day 3 of vancomycin for R axillary abscess. Patient is neutropenic from Cytoxan used to treat ANCA vasculitis. Renal function is improving. Vancomycin trough is therapeutic so will continue current regimen.  Goal of Therapy:  Vancomycin trough level 15-20 mcg/ml  Plan:  Continue vancomycin 1500 mg IV q24h Will follow-up in am Recheck trough in a few days if treatment continues   [facility], Pharm.D., BCPS Clinical Pharmacist 04/06/2012 8:06 PM

## 2012-04-06 NOTE — Progress Notes (Signed)
I note that Dr. Johnnye Sima has added Valtrex for empiric viral coverage - agree.   If pt is still profoundly neutropenic on AM blood draw would recommend addition of neupogen (ID says would be OK with this)  Jamal Maes, MD [place] Pager 04/06/2012, 7:47 PM

## 2012-04-06 NOTE — Progress Notes (Signed)
Physical Therapy Treatment Patient Details Name: Ryan Jimenez MRN: [id number] DOB: 03/09/1932 Today's Date: 04/06/2012 Time: [id number] PT Time Calculation (min): 16 min  PT Assessment / Plan / Recommendation Comments on Treatment Session  Patient required some encouragement as he had just taken a nap and wanted to remain in bed. Patient agreeable to ambulate and sit up for lunch. Patient progressing with RW however would like to progress to [facility].     Follow Up Recommendations  Home health PT;Supervision/Assistance - 24 hour    Barriers to Discharge        Equipment Recommendations  None recommended by PT    Recommendations for Other Services    Frequency Min 3X/week   Plan Discharge plan remains appropriate    Precautions / Restrictions Precautions Precautions: Fall   Pertinent Vitals/Pain Denied pain    Mobility  Bed Mobility Supine to Sit: 6: Modified independent (Device/Increase time) Sitting - Scoot to Edge of Bed: 6: Modified independent (Device/Increase time) Transfers Sit to Stand: 5: Supervision;With upper extremity assist;From bed Stand to Sit: 5: Supervision;With upper extremity assist;With armrests;To chair/3-in-1 Details for Transfer Assistance: Cues for safe hand placement. Cues to control descent into recliner.  Ambulation/Gait Ambulation/Gait Assistance: 4: Min guard Ambulation Distance (Feet): 250 Feet Assistive device: Rolling walker Ambulation/Gait Assistance Details: Patient with use of RW this session. Cues for safety and management of RW.  Gait Pattern: Step-through pattern;Trunk flexed    Exercises     PT Diagnosis:    PT Problem List:   PT Treatment Interventions:     PT Goals Acute Rehab PT Goals PT Goal: Supine/Side to Sit - Progress: Progressing toward goal PT Goal: Sit to Stand - Progress: Progressing toward goal PT Goal: Stand to Sit - Progress: Progressing toward goal PT Goal: Ambulate - Progress: Progressing toward goal  Visit  Information  Last PT Received On: 04/06/12 Assistance Needed: +1    Subjective Data      Cognition  Overall Cognitive Status: Appears within functional limits for tasks assessed/performed Arousal/Alertness: Awake/alert Orientation Level: Appears intact for tasks assessed Behavior During Session: [facility] for tasks performed    Balance     End of Session PT - End of Session Equipment Utilized During Treatment: Gait belt Activity Tolerance: Patient tolerated treatment well Patient left: in chair Nurse Communication: Mobility status    Jacqualyn Posey 04/06/2012, 2:26 PM 04/06/2012 Jacqualyn Posey PTA [phone] pager [phone] office

## 2012-04-07 DIAGNOSIS — N189 Chronic kidney disease, unspecified: Secondary | ICD-10-CM

## 2012-04-07 DIAGNOSIS — D61818 Other pancytopenia: Secondary | ICD-10-CM

## 2012-04-07 DIAGNOSIS — IMO0001 Reserved for inherently not codable concepts without codable children: Secondary | ICD-10-CM

## 2012-04-07 DIAGNOSIS — D709 Neutropenia, unspecified: Secondary | ICD-10-CM

## 2012-04-07 DIAGNOSIS — E1165 Type 2 diabetes mellitus with hyperglycemia: Secondary | ICD-10-CM

## 2012-04-07 LAB — DIFFERENTIAL
Eosinophils Absolute: 0.2 10*3/uL (ref 0.0–0.7)
Eosinophils Relative: 14 % — ABNORMAL HIGH (ref 0–5)
Lymphs Abs: 0.4 10*3/uL — ABNORMAL LOW (ref 0.7–4.0)
Monocytes Absolute: 0.5 10*3/uL (ref 0.1–1.0)
Neutrophils Relative %: 29 % — ABNORMAL LOW (ref 43–77)

## 2012-04-07 LAB — CBC
MCH: 29.6 pg (ref 26.0–34.0)
MCHC: 33.3 g/dL (ref 30.0–36.0)
MCV: 88.7 fL (ref 78.0–100.0)
Platelets: 149 10*3/uL — ABNORMAL LOW (ref 150–400)
RBC: 3.28 MIL/uL — ABNORMAL LOW (ref 4.22–5.81)
RDW: 17.7 % — ABNORMAL HIGH (ref 11.5–15.5)

## 2012-04-07 LAB — GLUCOSE, CAPILLARY
Glucose-Capillary: 184 mg/dL — ABNORMAL HIGH (ref 70–99)
Glucose-Capillary: 207 mg/dL — ABNORMAL HIGH (ref 70–99)
Glucose-Capillary: 169 mg/dL — ABNORMAL HIGH (ref 70–99)
Glucose-Capillary: 155 mg/dL — ABNORMAL HIGH (ref 70–99)

## 2012-04-07 LAB — RENAL FUNCTION PANEL
Albumin: 2.2 g/dL — ABNORMAL LOW (ref 3.5–5.2)
BUN: 22 mg/dL (ref 6–23)
Calcium: 9.1 mg/dL (ref 8.4–10.5)
Creatinine, Ser: 1.41 mg/dL — ABNORMAL HIGH (ref 0.50–1.35)
GFR calc non Af Amer: 47 mL/min — ABNORMAL LOW (ref >90)
Phosphorus: 3 mg/dL (ref 2.3–4.6)

## 2012-04-07 NOTE — Progress Notes (Signed)
[facility] - PROGRESS NOTE Resident Note   Please see below for attending addendum to resident note.  Subjective:   He reports feeling better- decreased arm pain but lot of itching. Was able to move his arm.   Objective:    Vital Signs:   Temp:  [97.6 F (36.4 C)-98.6 F (37 C)] 98.6 F (37 C) (05/25 0605) Pulse Rate:  [68-73] 69  (05/25 0605) Resp:  [20-22] 20  (05/25 0605) BP: (102-157)/(57-81) 157/81 mmHg (05/25 0605) SpO2:  [95 %-97 %] 97 % (05/25 0605) Weight:  [228 lb 8 oz (103.647 kg)] 228 lb 8 oz (103.647 kg) (05/25 0605) Last BM Date: 04/06/12  24-hour weight change: Weight change: -1 lb 9 oz (-0.709 kg)  Weight trends: Filed Weights   04/04/12 2010 04/06/12 0500 04/07/12 0605  Weight: 228 lb 8 oz (103.647 kg) 230 lb 1 oz (104.356 kg) 228 lb 8 oz (103.647 kg)    Intake/Output:  05/24 0701 - 05/25 0700 In: 2160 [P.O.:960; IV Piggyback:1200] Out: 2225 [Urine:2225] + 200  Physical Exam: General: Vital signs reviewed and noted. Well-developed, well-nourished, in no acute distress; alert, appropriate and cooperative throughout examination.  Lungs:  Normal respiratory effort. Clear to auscultation BL without crackles or wheezes.  Heart: RRR. S1 and S2 normal without gallop, murmur, or rubs.  Abdomen:  BS normoactive. Soft, Nondistended, non-tender.  No masses or organomegaly.  Extremities: 1+ pretibial edema., R axillary swelling and tenderness with some skin breakdown an blisters, oral ulcers in the right lateral side and hard palate. Non erythematous maculopapular smaller lesions along inside of left upper arm, and some in intertriginous retions     Labs: Basic Metabolic Panel:  Lab [id number] 04/06/12 0720 04/05/12 0350 04/04/12 1956 04/04/12 1341  NA 143 138 135 -- 131*  K 4.1 4.1 2.9* -- 3.0*  CL 109 105 100 -- 96  CO2 23 23 24  -- 21  GLUCOSE 200* 255* 309* -- 311*  BUN 22 23 29* -- 29*  CREATININE 1.41* 1.41* 1.63* 1.78* 1.78*    CALCIUM 9.1 8.9 8.2* -- --  MG -- 1.8 -- -- --  PHOS 3.0 -- -- -- --    Liver Function Tests:  Lab 04/07/12 0620 04/06/12 0720 04/05/12 0350  AST -- 21 18  ALT -- 25 19  ALKPHOS -- 37* 38*  BILITOT -- 0.4 0.4  PROT -- 5.3* 5.1*  ALBUMIN 2.2* 2.2* 2.1*    CBC:  Lab 04/07/12 0620 04/06/12 0720 04/05/12 0350 04/04/12 1956 04/04/12 1341  WBC 1.5* 1.0* 0.8* 0.7* 0.9*  NEUTROABS PENDING 0.1* -- -- 0.2*  HGB 9.7* 9.9* 9.4* 9.6* 10.0*  HCT 29.1* 29.2* 27.4* 28.3* 29.4*  MCV 88.7 87.4 86.7 87.9 87.0  PLT 149* 139* 121* 111* 121*    Cardiac Enzymes:  Lab 04/05/12 1120 04/05/12 0350 04/04/12 1956  CKTOTAL 46 67 89  CKMB 1.9 2.3 2.2  CKMBINDEX -- -- --  TROPONINI <0.30 <0.30 <0.30     CBG:  Lab 04/06/12 2157 04/06/12 1621 04/06/12 1116 04/06/12 0707 04/05/12 2202  GLUCAP 284* 286* 240* 81* 24*    Microbiology: Results for orders placed during the hospital encounter of 04/04/12  CULTURE, BLOOD (ROUTINE X 2)     Status: Normal (Preliminary result)   Collection Time   04/04/12  3:10 PM      Component Value Range Status Comment   Specimen Description BLOOD ARM RIGHT   Final    Special Requests BOTTLES DRAWN AEROBIC AND  ANAEROBIC 10CC   Final    Culture  Setup Time [id number]   Final    Culture     Final    Value:        BLOOD CULTURE RECEIVED NO GROWTH TO DATE CULTURE WILL BE HELD FOR 5 DAYS BEFORE ISSUING A FINAL NEGATIVE REPORT   Report Status PENDING   Incomplete   CULTURE, BLOOD (ROUTINE X 2)     Status: Normal (Preliminary result)   Collection Time   04/04/12  3:20 PM      Component Value Range Status Comment   Specimen Description BLOOD HAND RIGHT   Final    Special Requests     Final    Value: BOTTLES DRAWN AEROBIC AND ANAEROBIC AERO 7CC,ANAE 3CC   Culture  Setup Time [id number]   Final    Culture     Final    Value:        BLOOD CULTURE RECEIVED NO GROWTH TO DATE CULTURE WILL BE HELD FOR 5 DAYS BEFORE ISSUING A FINAL NEGATIVE REPORT   Report Status  PENDING   Incomplete      Urinalysis:  Basename 04/04/12 1626  COLORURINE YELLOW  LABSPEC 1.013  PHURINE 5.5  GLUCOSEU NEGATIVE  HGBUR NEGATIVE  BILIRUBINUR NEGATIVE  KETONESUR NEGATIVE  PROTEINUR 30*  UROBILINOGEN 1.0  NITRITE NEGATIVE  LEUKOCYTESUR NEGATIVE    Iron/TIBC/Ferritin    Component Value Date/Time   IRON 26* 03/07/2012 1301   TIBC 167* 03/07/2012 1301   FERRITIN 395* 01/11/2012 1325       Imaging: Ct Shoulder Right Wo Contrast  04/06/2012  *RADIOLOGY REPORT*  Clinical Data: Right axillary pain, erythema and swelling. Question abscess.  CT OF THE RIGHT SHOULDER WITHOUT CONTRAST  Technique:  Multidetector CT imaging was performed according to the standard protocol. Multiplanar CT image reconstructions were also generated.  Comparison: None.  Findings: There is soft tissue stranding in the perivascular fat of the right axilla which extends posteriorly along the right chest wall.  No focal fluid collection is demonstrated without contrast. There is no lymphadenopathy.  Vascular assessment is limited without contrast.  The right shoulder musculature appears normal.  No osseous abnormalities are identified.  The visualized right lung appears normal.  IMPRESSION:  1.  Nonspecific soft tissue stranding in the right axilla (consistent with cellulitis in this clinical context). 2.  No evidence of abscess.  Original Report Authenticated By: Vivia Ewing, M.D.        Assessment/ Plan:    Pt is a 78 y.o. yo male with a PMHx of DM, HTN, ANCA vasculitis  Renal failure who was admitted on 04/04/2012 for right axillary cleeulitis/skin lesions with neutropenia. He is on cytoxan for his ANCA vasculitis renal failure and follows up with Dr. Posey Pronto as outpatient. Renal service is consulted to manage his immunosuppressive meds in the setting of neutropenia. Cytoxan is on hold.  1. Neutropenia: s/p 1 dose of Neupogen. Likely secondary to cytoxan. He presented with WBC- 0.9 with ANC of  200. Last WBC from 1 month ago was 4300. His WBC  Is 1500 with ANC of 400 today which has improved from yesterday. With the appearance of his R axillary cellulitis and new oral ulcers there was a suspicion for viral infections like VZV. He was also noticed to have non erythematous maculopapular rash on the left arm and intertriginous areas that is more prominent today and also noticed on waist.  CT scan of  Axilla  is consistent with cellulitis.[name]  Per primary team and ID .  -Continue vancomycin and zosyn.  Started on valtrex yesterday 5/24. -F/U viral cultures from axilla and mouth.    2. 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 He presented to the hospital at his baseline. Cr continues to be stable( 1.4 today). He was getting treated with cyclophosphamide and prednisone. In February when he presented with renal vasculitis, the creatinine peaked at 6.5. He was treated with plasmapheresis, cytoxan and steroids and has gradually been improving each month. In March creatinine was down into the 3's, in April in the 2's and now creat is around 1.5 and urinalysis show no significant hematuria or proteinuria.  This is all c/w remission of renal disease and cytoxan is the most likely cause of the remission. For now our plan is to hold cytoxan for several weeks until current infection issues are resolved, then to restart Cytoxan po at a lower dose, around 100mg /d most likely, and titrate up if needed.  Continue prednisone in the meantime at 10 mg/d.  It is not clear whether Cytoxan caused the neutropenia or an acute infection was responsible, as I'm told that less than a month ago his WBC was stable around 4000 on the cytoxan,  but Cytoxan BM toxicity is certainly is a possiblity. - Continue to  hold cytoxan - continuing daily prednisone  - Continue to monitor his kidney function.   3.  Hypokalemia: Resolved. K- 4.1 likely secondary to lasix. Mg levels- 1.8 on 5/23.  4. Anemia:His hemoglobin is ranging in 9 with baseline being 10.5-11. Iron -26 and ferritin in 300's.For his anemia of chronic disease, he is ongoing Procrit therapy with 10,000 q. monthly with the last dose given May of 2013.  - Hold cytoxan. - May consider starting oral iron as well (once infection clarified and controlled)   5.Hypertension : BP stable.  ON Norvasc, Coreg, Lasix , flomax.  - Hydralazine was d/c on 5/23.  5.Diabetes mellitus type II: HbA1C- 7.4 on 5/22. CBGs are running high in the setting of prednisone and acute illness.  -Continue Lantus and NovoLog  -Per primary   6. MBD: Phosphorus -3.0. S/p ergocalciferol supplementation. On Renvela  7. Dispo: Pending until further clinical improvement .      Length of Stay: 3 days  Patient history and plan of care reviewed with attending, Dr. Jonnie Finner.   Pedro Earls, MD  PGYII, Internal Medicine Resident 04/07/2012, 7:31 AM  Patient seen and examined and agree with assessment and plan as above with additions underlined.   Kelly Splinter  MD [facility] [phone] pgr    [phone] cell 04/07/2012, 1:00 PM

## 2012-04-07 NOTE — Progress Notes (Signed)
[facility] Progress Note  04/07/2012  Subjective: Pt says he is feeling much better, much less pain and discomfort in arm.  No fever or chills, eating well.    Objective:  Vital signs in last 24 hours: Filed Vitals:   04/06/12 0700 04/06/12 1355 04/06/12 2200 04/07/12 0605  BP: 122/64 102/57 146/78 157/81  Pulse: 73 73 68 69  Temp: 98.5 F (36.9 C) 97.6 F (36.4 C) 98.3 F (36.8 C) 98.6 F (37 C)  TempSrc: Oral Oral Oral Oral  Resp: 20 20 22 20   Height:      Weight:    103.647 kg (228 lb 8 oz)  SpO2: 93% 95% 96% 97%   Weight change: -0.709 kg (-1 lb 9 oz)  Intake/Output Summary (Last 24 hours) at 04/07/12 [id number] Last data filed at 04/07/12 0600  Gross per 24 hour  Intake   1920 ml  Output   2025 ml  Net   -105 ml   Lab Results  Component Value Date   HGBA1C 7.4* 04/04/2012   HGBA1C 6.7* 11/24/2011   Lab Results  Component Value Date   CREATININE 1.41* 04/07/2012    Review of Systems As above, otherwise all reviewed and reported negative  Physical Exam Well-developed, well-nourished, in no acute distress; alert, appropriate and cooperative throughout examination.  Heent:oral ulcers in the right lateral side and hard palate  Lungs: Normal respiratory effort. Clear to auscultation BL without crackles or wheezes.  Heart: RRR. S1 and S2 normal without gallop, murmur, or rubs.  Abdomen: BS normoactive. Soft, Nondistended, non-tender. No masses or organomegaly.  Extremities: no pretibial edema, R axillary swelling and tenderness markedly improved with some skin breakdown and blisters healing, multiple skin tags  Lab Results: Results for orders placed during the hospital encounter of 04/04/12 (from the past 24 hour(s))  GLUCOSE, CAPILLARY     Status: Abnormal   Collection Time   04/06/12 11:16 AM      Component Value Range   Glucose-Capillary 240 (*) 70 - 99 (mg/dL)   Comment 1 Documented in Chart     Comment 2 Notify RN    GLUCOSE, CAPILLARY     Status:  Abnormal   Collection Time   04/06/12  4:21 PM      Component Value Range   Glucose-Capillary 286 (*) 70 - 99 (mg/dL)   Comment 1 Documented in Chart     Comment 2 Notify RN    Danise Mina, [place]     Status: Normal   Collection Time   04/06/12  7:01 PM      Component Value Range   Vancomycin Tr 14.8  10.0 - 20.0 (ug/mL)  GLUCOSE, CAPILLARY     Status: Abnormal   Collection Time   04/06/12  9:57 PM      Component Value Range   Glucose-Capillary 284 (*) 70 - 99 (mg/dL)   Comment 1 Documented in Chart     Comment 2 Notify RN    RENAL FUNCTION PANEL     Status: Abnormal   Collection Time   04/07/12  6:20 AM      Component Value Range   Sodium 143  135 - 145 (mEq/L)   Potassium 4.1  3.5 - 5.1 (mEq/L)   Chloride 109  96 - 112 (mEq/L)   CO2 23  19 - 32 (mEq/L)   Glucose, Bld 200 (*) 70 - 99 (mg/dL)   BUN 22  6 - 23 (mg/dL)   Creatinine, Ser 1.41 (*) 0.50 - 1.35 (  mg/dL)   Calcium 9.1  8.4 - 10.5 (mg/dL)   Phosphorus 3.0  2.3 - 4.6 (mg/dL)   Albumin 2.2 (*) 3.5 - 5.2 (g/dL)   GFR calc non Af Amer 47 (*) >90 (mL/min)   GFR calc Af Amer 54 (*) >90 (mL/min)  CBC     Status: Abnormal   Collection Time   04/07/12  6:20 AM      Component Value Range   WBC 1.5 (*) 4.0 - 10.5 (K/uL)   RBC 3.28 (*) 4.22 - 5.81 (MIL/uL)   Hemoglobin 9.7 (*) 13.0 - 17.0 (g/dL)   HCT 29.1 (*) 39.0 - 52.0 (%)   MCV 88.7  78.0 - 100.0 (fL)   MCH 29.6  26.0 - 34.0 (pg)   MCHC 33.3  30.0 - 36.0 (g/dL)   RDW 17.7 (*) 11.5 - 15.5 (%)   Platelets 149 (*) 150 - 400 (K/uL)  DIFFERENTIAL     Status: Abnormal   Collection Time   04/07/12  6:20 AM      Component Value Range   Neutrophils Relative 29 (*) 43 - 77 (%)   Lymphocytes Relative 26  12 - 46 (%)   Monocytes Relative 30 (*) 3 - 12 (%)   Eosinophils Relative 14 (*) 0 - 5 (%)   Basophils Relative 1  0 - 1 (%)   Neutro Abs 0.4 (*) 1.7 - 7.7 (K/uL)   Lymphs Abs 0.4 (*) 0.7 - 4.0 (K/uL)   Monocytes Absolute 0.5  0.1 - 1.0 (K/uL)   Eosinophils Absolute 0.2  0.0  - 0.7 (K/uL)   Basophils Absolute 0.0  0.0 - 0.1 (K/uL)   WBC Morphology ATYPICAL LYMPHOCYTES    GLUCOSE, CAPILLARY     Status: Abnormal   Collection Time   04/07/12  6:50 AM      Component Value Range   Glucose-Capillary 184 (*) 70 - 99 (mg/dL)   Comment 1 Notify RN     Comment 2 Documented in Chart    GLUCOSE, CAPILLARY     Status: Abnormal   Collection Time   04/07/12  8:20 AM      Component Value Range   Glucose-Capillary 207 (*) 70 - 99 (mg/dL)    Micro Results: Recent Results (from the past 240 hour(s))  CULTURE, BLOOD (ROUTINE X 2)     Status: Normal (Preliminary result)   Collection Time   04/04/12  3:10 PM      Component Value Range Status Comment   Specimen Description BLOOD ARM RIGHT   Final    Special Requests BOTTLES DRAWN AEROBIC AND ANAEROBIC 10CC   Final    Culture  Setup Time [id number]   Final    Culture     Final    Value:        BLOOD CULTURE RECEIVED NO GROWTH TO DATE CULTURE WILL BE HELD FOR 5 DAYS BEFORE ISSUING A FINAL NEGATIVE REPORT   Report Status PENDING   Incomplete   CULTURE, BLOOD (ROUTINE X 2)     Status: Normal (Preliminary result)   Collection Time   04/04/12  3:20 PM      Component Value Range Status Comment   Specimen Description BLOOD HAND RIGHT   Final    Special Requests     Final    Value: BOTTLES DRAWN AEROBIC AND ANAEROBIC [facility]   Culture  Setup Time [id number]   Final    Culture     Final    Value:  BLOOD CULTURE RECEIVED NO GROWTH TO DATE CULTURE WILL BE HELD FOR 5 DAYS BEFORE ISSUING A FINAL NEGATIVE REPORT   Report Status PENDING   Incomplete     Medications:  Scheduled Meds:   . allopurinol  100 mg Oral Daily  . amLODipine  10 mg Oral Daily  . aspirin EC  81 mg Oral Daily  . carvedilol  12.5 mg Oral BID WC  . clindamycin   Topical BID  . dutasteride  0.5 mg Oral Daily  . feeding supplement  237 mL Oral Q24H  . filgrastim (NEUPOGEN)  SQ  480 mcg Subcutaneous q1800  . furosemide  40 mg Oral Daily  .  heparin  5,000 Units Subcutaneous Q8H  . insulin aspart  0-20 Units Subcutaneous TID WC  . insulin aspart  0-5 Units Subcutaneous QHS  . insulin aspart  7 Units Subcutaneous TID WC  . insulin glargine  20 Units Subcutaneous QHS  . megestrol  400 mg Oral Daily  . piperacillin-tazobactam (ZOSYN)  IV  3.375 g Intravenous Q8H  . predniSONE  10 mg Oral Daily  . sevelamer  800 mg Oral TID WC  . Tamsulosin HCl  0.4 mg Oral Daily  . valACYclovir  500 mg Oral BID  . vancomycin  1,500 mg Intravenous Q24H  . DISCONTD: clindamycin   Topical QHS  . DISCONTD: insulin aspart  0-9 Units Subcutaneous TID WC  . DISCONTD: insulin aspart  5 Units Subcutaneous TID WC  . DISCONTD: insulin glargine  15 Units Subcutaneous QHS   Continuous Infusions:   . 0.9 % NaCl with KCl 40 mEq / L 50 mL/hr at 04/06/12 1654   PRN Meds:.acetaminophen, acetaminophen, senna-docusate  Assessment/Plan: 1. Neutropenia: Likely secondary to cytoxan. Improving daily,  S/p neupogen x 1 dose, Continue vancomycin and zosyn today, switch to oral tomorrow.  Valtrex started for viral treatment. viral cultures from axilla and mouth pending .  2. 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 He has a new baseline~1.4. - Continue to hold cytoxan and continuing daily prednisone, Continuing to monitor his kidney function which is improving. Nephrology team following.  3. R axillary cellulitis: Clinically improving. Likely Hidradenitis suppurativa. CT scan of Axilla is consistent with cellulitis. Antibiotics as above.    3. Hypokalemia: Resolved. K- 4.1 likely secondary to lasix.  4. Anemia:His hemoglobin is ranging in 9 with baseline being 10.5-11. Iron -26 and ferritin in 300's.For his anemia of chronic disease, he is ongoing Procrit therapy with 10,000 q. monthly with the last dose given May of 2013. - Holding cytoxan.     5.Hypertension : BP stable. ON Norvasc, Coreg, Lasix , flomax.   5.Diabetes mellitus type II: HbA1C- 7.4 on 5/22. Uncontrolled but improving. Intensify basal bolus therapy. Continue Lantus and NovoLog - see orders.   6. 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 S/p ergocalciferol supplementation. On Renvela  7. Dispo: Anticipate home in next 1-2 days.    LOS: 3 days   Ryan Jimenez 04/07/2012, 8:37 AM   Murlean Iba, MD, CDE, FAAFP [facility] [place], [place]

## 2012-04-08 DIAGNOSIS — N189 Chronic kidney disease, unspecified: Secondary | ICD-10-CM

## 2012-04-08 DIAGNOSIS — D61818 Other pancytopenia: Secondary | ICD-10-CM

## 2012-04-08 DIAGNOSIS — D709 Neutropenia, unspecified: Secondary | ICD-10-CM

## 2012-04-08 DIAGNOSIS — E1165 Type 2 diabetes mellitus with hyperglycemia: Secondary | ICD-10-CM

## 2012-04-08 LAB — DIFFERENTIAL
Lymphocytes Relative: 25 % (ref 12–46)
Myelocytes: 2 %
Neutro Abs: 2 10*3/uL (ref 1.7–7.7)
Neutrophils Relative %: 38 % — ABNORMAL LOW (ref 43–77)
Promyelocytes Absolute: 0 %
nRBC: 0 /100 WBC

## 2012-04-08 LAB — CBC
MCH: 29.3 pg (ref 26.0–34.0)
MCHC: 33 g/dL (ref 30.0–36.0)
Platelets: 153 10*3/uL (ref 150–400)
RBC: 3.51 MIL/uL — ABNORMAL LOW (ref 4.22–5.81)

## 2012-04-08 LAB — RENAL FUNCTION PANEL
CO2: 26 mEq/L (ref 19–32)
Chloride: 107 mEq/L (ref 96–112)
GFR calc Af Amer: 53 mL/min — ABNORMAL LOW (ref >90)
Glucose, Bld: 176 mg/dL — ABNORMAL HIGH (ref 70–99)
Potassium: 3.7 mEq/L (ref 3.5–5.1)
Sodium: 142 mEq/L (ref 135–145)

## 2012-04-08 LAB — GLUCOSE, CAPILLARY: Glucose-Capillary: 154 mg/dL — ABNORMAL HIGH (ref 70–99)

## 2012-04-08 MED ORDER — DIPHENHYDRAMINE-ZINC ACETATE 2-0.1 % EX CREA
TOPICAL_CREAM | Freq: Three times a day (TID) | CUTANEOUS | Status: DC | PRN
  Filled 2012-04-08 (×3): qty 28.4

## 2012-04-08 MED ORDER — INSULIN GLARGINE 100 UNIT/ML ~~LOC~~ SOLN: 15.0000 [IU] | Freq: Every day | SUBCUTANEOUS | Status: DC

## 2012-04-08 MED ORDER — GLUCERNA SHAKE PO LIQD: 237.0000 mL | ORAL | Status: DC

## 2012-04-08 MED ORDER — ACIDOPHILUS PROBIOTIC 100 MG PO CAPS: 1.0000 | ORAL_CAPSULE | Freq: Three times a day (TID) | ORAL | Status: DC

## 2012-04-08 MED ORDER — CLINDAMYCIN PHOSPHATE 1 % EX GEL: Freq: Two times a day (BID) | CUTANEOUS | Status: AC

## 2012-04-08 MED ORDER — AMOXICILLIN-POT CLAVULANATE 875-125 MG PO TABS: 1.0000 | ORAL_TABLET | Freq: Two times a day (BID) | ORAL | Status: AC

## 2012-04-08 MED ORDER — VALACYCLOVIR HCL 500 MG PO TABS: 500.0000 mg | ORAL_TABLET | Freq: Two times a day (BID) | ORAL | Status: DC

## 2012-04-08 NOTE — Discharge Instructions (Signed)
PLEASE FOLLOW UP VIRAL CULTURE RESULTS WITH YOUR PRIMARY CARE PHYSICIAN ON FOLLOW UP APPOINTMENT.  Use Benadryl and Benadryl Creme Over the Counter as needed for itching.   Neutropenia Neutropenia is a condition that occurs when the level of a certain type of white blood cell (neutrophil) in your body becomes lower than normal. Neutrophils are made in the bone marrow and fight infections. These cells protect against bacteria and viruses. The fewer neutrophils you have, and the longer your body remains without them, the greater your risk of getting a severe infection becomes. CAUSES  The cause of neutropenia may be hard to determine. However, it is usually due to 3 main problems:   Decreased production of neutrophils. This may be due to:   Certain medicines such as chemotherapy.   Genetic problems.   Cancer.   Radiation treatments.   Vitamin deficiency.   Some pesticides.   Increased destruction of neutrophils. This may be due to:   Overwhelming infections.   Hemolytic anemia. This is when the body destroys its own blood cells.   Chemotherapy.   Neutrophils moving to areas of the body where they cannot fight infections. This may be due to:   Dialysis procedures.   Conditions where the spleen becomes enlarged. Neutrophils are held in the spleen and are not available to the rest of the body.   Overwhelming infections. The neutrophils are held in the area of the infection and are not available to the rest of the body.  SYMPTOMS  There are no specific symptoms of neutropenia. The lack of neutrophils can result in an infection, and an infection can cause various problems. DIAGNOSIS  Diagnosis is made by a blood test. A complete blood count is performed. The normal level of neutrophils in human blood differs with age and race. Infants have lower counts than older children and adults. African Americans have lower counts than Caucasians or Asians. The average adult level is 1500  cells/mm3 of blood. Neutrophil counts are interpreted as follows:  Greater than 1000 cells/mm3 gives normal protection against infection.   500 to 1000 cells/mm3 gives an increased risk for infection.   200 to 500 cells/mm3 is a greater risk for severe infection.   Lower than 200 cells/mm3 is a marked risk of infection. This may require hospitalization and treatment with antibiotic medicines.  TREATMENT  Treatment depends on the underlying cause, severity, and presence of infections or symptoms. It also depends on your health. Your caregiver will discuss the treatment plan with you. Mild cases are often easily treated and have a good outcome. Preventative measures may also be started to limit your risk of infections. Treatment can include:  Taking antibiotics.   Stopping medicines that are known to cause neutropenia.   Correcting nutritional deficiencies by eating green vegetables to supply folic acid and taking vitamin B supplements.   Stopping exposure to pesticides if your neutropenia is related to pesticide exposure.   Taking a blood growth factor called sargramostim, pegfilgrastim, or filgrastim if you are undergoing chemotherapy for cancer. This stimulates white blood cell production.   Removal of the spleen if you have Felty's syndrome and have repeated infections.  HOME CARE INSTRUCTIONS   Follow your caregiver's instructions about when you need to have blood work done.   Wash your hands often. Make sure others who come in contact with you also wash their hands.   Wash raw fruits and vegetables before eating them. They can carry bacteria and fungi.   Avoid people  with colds or spreadable (contagious) diseases (chickenpox, herpes zoster, influenza).   Avoid large crowds.   Avoid construction areas. The dust can release fungus into the air.   Be cautious around children in daycare or school environments.   Take care of your respiratory system by coughing and deep  breathing.   Bathe daily.   Protect your skin from cuts and burns.   Do not work in the garden or with flowers and plants.   Care for the mouth before and after meals by brushing with a soft toothbrush. If you have mucositis, do not use mouthwash. Mouthwash contains alcohol and can dry out the mouth even more.   Clean the area between the genitals and the anus (perineal area) after urination and bowel movements. Women need to wipe from front to back.   Use a water soluble lubricant during sexual intercourse and practice good hygiene after. Do not have intercourse if you are severely neutropenic. Check with your caregiver for guidelines.   Exercise daily as tolerated.   Avoid people who were vaccinated with a live vaccine in the past 30 days. You should not receive live vaccines (polio, typhoid).   Do not provide direct care for pets. Avoid animal droppings. Do not clean litter boxes and bird cages.   Do not share food utensils.   Do not use tampons, enemas, or rectal suppositories unless directed by your caregiver.   Use an electric razor to remove hair.   Wash your hands after handling magazines, letters, and newspapers.  SEEK IMMEDIATE MEDICAL CARE IF:   You have a fever.   You have chills or start to shake.   You feel nauseous or vomit.   You develop mouth sores.   You develop aches and pains.   You have redness and swelling around open wounds.   Your skin is warm to the touch.   You have pus coming from your wounds.   You develop swollen lymph nodes.   You feel weak or fatigued.   You develop red streaks on the skin.  MAKE SURE YOU:  Understand these instructions.   Will watch your condition.   Will get help right away if you are not doing well or get worse.  Document Released: 04/22/2002 Document Revised: 10/20/2011 Document Reviewed: 05/20/2011 [facility] Patient Information 2012 [place].  Hidradenitis Suppurativa, Sweat Gland  Abscess Hidradenitis suppurativa is a long lasting (chronic), uncommon disease of the sweat glands. With this, boil-like lumps and scarring develop in the groin, some times under the arms (axillae), and under the breasts. It may also uncommonly occur behind the ears, in the crease of the buttocks, and around the genitals.  CAUSES  The cause is from a blocking of the sweat glands. They then become infected. It may cause drainage and odor. It is not contagious. So it cannot be given to someone else. It most often shows up in puberty (about 7 to 76 years of age). But it may happen much later. It is similar to acne which is a disease of the sweat glands. This condition is slightly more common in African-Americans and women. SYMPTOMS   Hidradenitis usually starts as one or more red, tender, swellings in the groin or under the arms (axilla).   Over a period of hours to days the lesions get larger. They often open to the skin surface, draining clear to yellow-colored fluid.   The infected area heals with scarring.  DIAGNOSIS  Your caregiver makes this diagnosis by looking  at you. Sometimes cultures (growing germs on plates in the lab) may be taken. This is to see what germ (bacterium) is causing the infection.  TREATMENT   Topical germ killing medicine applied to the skin (antibiotics) are the treatment of choice. Antibiotics taken by mouth (systemic) are sometimes needed when the condition is getting worse or is severe.   Avoid tight-fitting clothing which traps moisture in.   Dirt does not cause hidradenitis and it is not caused by poor hygiene.   Involved areas should be cleaned daily using an antibacterial soap. Some patients find that the liquid form of Lever 2000, applied to the involved areas as a lotion after bathing, can help reduce the odor related to this condition.   Sometimes surgery is needed to drain infected areas or remove scarred tissue. Removal of large amounts of tissue is used  only in severe cases.   Birth control pills may be helpful.   Oral retinoids (vitamin A derivatives) for 6 to 12 months which are effective for acne may also help this condition.   Weight loss will improve but not cure hidradenitis. It is made worse by being overweight. But the condition is not caused by being overweight.   This condition is more common in people who have had acne.   It may become worse under stress.  There is no medical cure for hidradenitis. It can be controlled, but not cured. The condition usually continues for years with periods of getting worse and getting better (remission). Document Released: 06/14/2004 Document Revised: 10/20/2011 Document Reviewed: 06/30/2008 [facility] Patient Information 2012 [place].  Antibiotic Medication Antibiotics are among the most frequently prescribed medicines. Antibiotics cure illness by assisting our body to injure or kill the bacteria that cause infection. While antibiotics are useful to treat a wide variety of infections they are useless against viruses. Antibiotics cannot cure colds, flu, or other viral infections.  There are many types of antibiotics available. Your caregiver will decide which antibiotic will be useful for an illness. Never take or give someone else's antibiotics or left over medicine. Your caregiver may also take into account:  Allergies.   The cost of the medicine.   Dosing schedules.   Taste.   Common side effects when choosing an antibiotic for an infection.  Ask your caregiver if you have questions about why a certain medicine was chosen. HOME CARE INSTRUCTIONS Read all instructions and labels on medicine bottles carefully. Some antibiotics should be taken on an empty stomach while others should be taken with food. Taking antibiotics incorrectly may reduce how well they work. Some antibiotics need to be kept in the refrigerator. Others should be kept at room temperature. Ask your caregiver or  pharmacist if you do not understand how to give the medicine. Be sure to give the amount of medicine your caregiver has prescribed. Even if you feel better and your symptoms improve, bacteria may still remain alive in the body. Taking all of the medicine will prevent:  The infection from returning and becoming harder to treat.   Complications from partially treated infections.  If there is any medicine left over after you have taken the medicine as your caregiver has instructed, throw the medicine away. Be sure to tell your caregiver if you:  Are allergic to any medicines.   Are pregnant or intend to become pregnant while using this medicine.   Are breastfeeding.   Are taking any other prescription, non-prescription medicine, or herbal remedies.   Have any other  medical conditions or problems you have not already discussed.  If you are taking birth control pills, they may not work while you are on antibiotics. To avoid unwanted pregnancy:  Continue taking your birth control pills as usual.   Use a second form of birth control (such as condoms) while you are taking antibiotic medicine.   When you finish taking the antibiotic medicine, continue using the second form of birth control until you are finished with your current 1 month cycle of birth control pills.  Try not to miss any doses of medicine. If you miss a dose, take it as soon as possible. However, if it is almost time for the next dose and the dosing schedule is:  2 doses a day, take the missed dose and the next dose 5 to 6 hours apart.   3 or more doses a day, take the missed dose and the next dose 2 to 4 hours apart, then go back to the normal schedule.   If you are unable to make up a missed dose, take the next scheduled dose on time and complete the missed dose at the end of the prescribed time for your medicine.  SIDE EFFECTS TO TAKING ANTIBIOTICS Common side effects to antibiotic use include:  Soft stools or diarrhea.    Mild stomach upset.   Sun sensitivity.  SEEK MEDICAL CARE IF:   If you get worse or do not improve within a few days of starting the medicine.   Vomiting develops.   Diaper rash or rash on the genitals appears.   Vaginal itching occurs.   White patches appear on the tongue or in the mouth.   Severe watery diarrhea and abdominal cramps occur.   Signs of an allergy develop (trouble breathing, wheezing, hives, unknown itchy rash appears, swelling of the lips, face or tongue, fainting, or blisters on the skin or in the mouth). STOP TAKING THE ANTIBIOTIC.  SEEK IMMEDIATE MEDICAL CARE IF:   Urine turns dark or blood colored.   Skin turns yellow.   Easy bruising or bleeding occurs.   Joint pain or muscle aches occur.   Fever returns.   Severe headache occurs.  Document Released: 07/13/2004 Document Revised: 10/20/2011 Document Reviewed: 07/23/2009 [facility] Patient Information 2012 [place].  Lactobacillus Oral formulations Probiotics What is this medicine? LACTOBACILLUS (lak toh buh SIL uhs) is a supplement. It is used to help the normal balance of bacteria in the colon. This may treat or prevent diarrhea caused by an infection or by antibiotics. The FDA has not approved this supplement for any medical use. This supplement may be used for other purposes; ask your health care provider or pharmacist if you have questions. This medicine may be used for other purposes; ask your health care provider or pharmacist if you have questions. What should I tell my health care provider before I take this medicine? They need to know if you have any of these conditions: -chronic disease -immune system problems -prosthetic heart valve or valvular heart disease -an unusual or allergic reaction to Lactobacillus, any medicines, lactose or milk, other foods, dyes, or preservatives -pregnant or trying to get pregnant -breast-feeding How should I use this medicine? Take this medicine by  mouth with a small amount of milk, fruit juice, or water. Follow the directions on the package labeling, or take as directed by your health care professional. This medicine can be taken with cereal or other food. Do not take this medicine more often than directed. Contact  your pediatrician regarding the use of this medicine in children. Special care may be needed. This medicine is not recommended for children under 77 years old unless prescribed by a doctor. Overdosage: If you think you have taken too much of this medicine contact a poison control center or emergency room at once. NOTE: This medicine is only for you. Do not share this medicine with others. What if I miss a dose? If you miss a dose, take it as soon as you can. If it is almost time for your next dose, take only that dose. Do not take double or extra doses. What may interact with this medicine? Interactions are not expected. This list may not describe all possible interactions. Give your health care provider a list of all the medicines, herbs, non-prescription drugs, or dietary supplements you use. Also tell them if you smoke, drink alcohol, or use illegal drugs. Some items may interact with your medicine. What should I watch for while using this medicine? See your doctor if your symptoms do not get better or if they get worse. Do not take this supplement for more than 2 days or if you have a fever unless your doctor tells you to. If you have allergies to milk or you are sensitive to lactose, do not use this supplement. What side effects may I notice from receiving this medicine? Side effects that you should report to your doctor or health care professional as soon as possible: -allergic reactions like skin rash, itching or hives, swelling of the face, lips, or tongue -breathing problems -severe nausea, vomiting -unusually weak or tired Side effects that usually do not require medical attention (report to your doctor or health care  professional if they continue or are bothersome): -hiccups -stomach gas This list may not describe all possible side effects. Call your doctor for medical advice about side effects. You may report side effects to FDA at 1-800-FDA-1088. Where should I keep my medicine? Keep out of the reach of children. Store in the refrigerator or as directed on the package label. Do not freeze. Throw away any unused medicine after the expiration date. NOTE: This sheet is a summary. It may not cover all possible information. If you have questions about this medicine, talk to your doctor, pharmacist, or health care provider.  2012, [employer]. (05/06/2008 5:04:11 PM)  Anemia, Nonspecific Your exam and blood tests show you are anemic. This means your blood (hemoglobin) level is low. Normal hemoglobin values are 12 to 15 g/dL for females and 14 to 17 g/dL for males. Make a note of your hemoglobin level today. The hematocrit percent is also used to measure anemia. A normal hematocrit is 38% to 46% in females and 42% to 49% in males. Make a note of your hematocrit level today. CAUSES  Anemia can be due to many different causes.  Excessive bleeding from periods (in women).   Intestinal bleeding.   Poor nutrition.   Kidney, thyroid, liver, and bone marrow diseases.  SYMPTOMS  Anemia can come on suddenly (acute). It can also come on slowly. Symptoms can include:  Minor weakness.   Dizziness.   Palpitations.   Shortness of breath.  Symptoms may be absent until half your hemoglobin is missing if it comes on slowly. Anemia due to acute blood loss from an injury or internal bleeding may require blood transfusion if the loss is severe. Hospital care is needed if you are anemic and there is significant continual blood loss. TREATMENT   Stool  tests for blood (Hemoccult) and additional lab tests are often needed. This determines the best treatment.   Further checking on your condition and your  response to treatment is very important. It often takes many weeks to correct anemia.  Depending on the cause, treatment can include:  Supplements of iron.   Vitamins 123456 and folic acid.   Hormone medicines.If your anemia is due to bleeding, finding the cause of the blood loss is very important. This will help avoid further problems.  SEEK IMMEDIATE MEDICAL CARE IF:   You develop fainting, extreme weakness, shortness of breath, or chest pain.   You develop heavy vaginal bleeding.   You develop bloody or black, tarry stools or vomit up blood.   You develop a high fever, rash, repeated vomiting, or dehydration.  Document Released: 12/08/2004 Document Revised: 10/20/2011 Document Reviewed: 09/15/2009 [facility] Patient Information 2012 [place].  Viral Infections A viral infection can be caused by different types of viruses.Most viral infections are not serious and resolve on their own. However, some infections may cause severe symptoms and may lead to further complications. SYMPTOMS Viruses can frequently cause:  Minor sore throat.   Aches and pains.   Headaches.   Runny nose.   Different types of rashes.   Watery eyes.   Tiredness.   Cough.   Loss of appetite.   Gastrointestinal infections, resulting in nausea, vomiting, and diarrhea.  These symptoms do not respond to antibiotics because the infection is not caused by bacteria. However, you might catch a bacterial infection following the viral infection. This is sometimes called a "superinfection." Symptoms of such a bacterial infection may include:  Worsening sore throat with pus and difficulty swallowing.   Swollen neck glands.   Chills and a high or persistent fever.   Severe headache.   Tenderness over the sinuses.   Persistent overall ill feeling (malaise), muscle aches, and tiredness (fatigue).   Persistent cough.   Yellow, green, or brown mucus production with coughing.  HOME CARE INSTRUCTIONS     Only take over-the-counter or prescription medicines for pain, discomfort, diarrhea, or fever as directed by your caregiver.   Drink enough water and fluids to keep your urine clear or pale yellow. Sports drinks can provide valuable electrolytes, sugars, and hydration.   Get plenty of rest and maintain proper nutrition. Soups and broths with crackers or rice are fine.  SEEK IMMEDIATE MEDICAL CARE IF:   You have severe headaches, shortness of breath, chest pain, neck pain, or an unusual rash.   You have uncontrolled vomiting, diarrhea, or you are unable to keep down fluids.   You or your child has an oral temperature above 102 F (38.9 C), not controlled by medicine.   Your baby is older than 3 months with a rectal temperature of 102 F (38.9 C) or higher.   Your baby is 75 months old or younger with a rectal temperature of 100.4 F (38 C) or higher.  MAKE SURE YOU:   Understand these instructions.   Will watch your condition.   Will get help right away if you are not doing well or get worse.  Document Released: 08/10/2005 Document Revised: 10/20/2011 Document Reviewed: 03/07/2011 [facility] Patient Information 2012 [place].

## 2012-04-08 NOTE — Progress Notes (Signed)
Previous dc plan confirmed with [facility] providing [facility] services upon discharge home. Pt has access to DME. No other needs stated.   Arlean Hopping [phone]

## 2012-04-08 NOTE — Progress Notes (Signed)
[facility] - PROGRESS NOTE Resident Note   Please see below for attending addendum to resident note.  Subjective:   He is doing better, still has itch but has not received any benadryl.  Rash on left arm is more prominent.  Cr has remains stable at 1.43. WBC is now 3.2 trending up from 1.5, ANC is now 2.  Primary team is discharging him today. Viral cultures have not been collected yet as of this AM.  I have asked nurse to collect prior to his discharge  Objective:    Vital Signs:   Temp:  [98 F (36.7 C)-98.7 F (37.1 C)] 98.7 F (37.1 C) (05/26 0528) Pulse Rate:  [63-76] 63  (05/26 0528) Resp:  [18-20] 18  (05/26 0528) BP: (132-165)/(74-85) 165/81 mmHg (05/26 0528) SpO2:  [94 %-98 %] 94 % (05/26 0528) Last BM Date: 04/06/12  24-hour weight change: Weight change:   Weight trends: Filed Weights   04/04/12 2010 04/06/12 0500 04/07/12 0605  Weight: 228 lb 8 oz (103.647 kg) 230 lb 1 oz (104.356 kg) 228 lb 8 oz (103.647 kg)   Intake/Output:  05/25 0701 - 05/26 0700 In: 840 [P.O.:240; IV Piggyback:600] Out: 2400 [Urine:2400]  Physical Exam:  General:  Vital signs reviewed and noted. Well-developed, well-nourished, in no acute distress; alert, appropriate and cooperative throughout examination.   Lungs:  Normal respiratory effort. Clear to auscultation BL without crackles or wheezes.   Heart:  RRR. S1 and S2 normal without gallop, murmur, or rubs.   Abdomen:  BS normoactive. Soft, Nondistended, non-tender. No masses or organomegaly.   Extremities:  1+ pretibial edema., R axillary swelling and tenderness with some skin breakdown with pink granulation tissue but no drainage noted, oral ulcers in the right lateral side and hard palate. Non erythematous maculopapular smaller lesions on left and right upper arm, and some in intertriginous, waist regions    Labs: Basic Metabolic Panel:  Lab 0000000 0600 04/07/12 0620 04/06/12 0720 04/05/12 0350 04/04/12 1956  04/04/12 1341  NA 142 143 138 135 -- 131*  K 3.7 4.1 4.1 2.9* -- 3.0*  CL 107 109 105 100 -- 96  CO2 26 23 23 24  -- 21  GLUCOSE 176* 200* 255* 309* -- 311*  BUN 19 22 23  29* -- 29*  CREATININE 1.43* 1.41* 1.41* 1.63* 1.78* --  CALCIUM 9.1 9.1 8.9 -- -- --  MG -- -- 1.8 -- -- --  PHOS 3.9 3.0 -- -- -- --   Liver Function Tests:  Lab 04/08/12 0600 04/07/12 0620 04/06/12 0720 04/05/12 0350  AST -- -- 21 18  ALT -- -- 25 19  ALKPHOS -- -- 37* 38*  BILITOT -- -- 0.4 0.4  PROT -- -- 5.3* 5.1*  ALBUMIN 2.3* 2.2* 2.2* 2.1*   CBC:  Lab 04/08/12 0600 04/07/12 0620 04/06/12 0720 04/05/12 0350 04/04/12 1956 04/04/12 1341  WBC 3.2* 1.5* 1.0* 0.8* 0.7* --  NEUTROABS 2.0 0.4* 0.1* -- -- 0.2*  HGB 10.3* 9.7* 9.9* 9.4* 9.6* --  HCT 31.2* 29.1* 29.2* 27.4* 28.3* --  MCV 88.9 88.7 87.4 86.7 87.9 --  PLT 153 149* 139* 121* 111* --   CBG:  Lab 04/08/12 0719 04/07/12 1604 04/07/12 1106 04/07/12 0820 04/07/12 0650  GLUCAP 154* 155* 169* 207* 184*     Medications:    Infusions:   Scheduled Medications:  . allopurinol  100 mg Oral Daily  . amLODipine  10 mg Oral Daily  . aspirin EC  81 mg Oral  Daily  . carvedilol  12.5 mg Oral BID WC  . clindamycin   Topical BID  . dutasteride  0.5 mg Oral Daily  . feeding supplement  237 mL Oral Q24H  . furosemide  40 mg Oral Daily  . heparin  5,000 Units Subcutaneous Q8H  . insulin aspart  0-20 Units Subcutaneous TID WC  . insulin aspart  0-5 Units Subcutaneous QHS  . insulin aspart  7 Units Subcutaneous TID WC  . insulin glargine  20 Units Subcutaneous QHS  . megestrol  400 mg Oral Daily  . piperacillin-tazobactam (ZOSYN)  IV  3.375 g Intravenous Q8H  . predniSONE  10 mg Oral Daily  . sevelamer  800 mg Oral TID WC  . Tamsulosin HCl  0.4 mg Oral Daily  . valACYclovir  500 mg Oral BID  . vancomycin  1,500 mg Intravenous Q24H   PRN Medications: acetaminophen, acetaminophen, senna-docusate  Assessment/ Plan:    Pt is a 78 y.o. yo male with  a PMHx of DM, HTN, ANCA vasculitis Renal failure who was admitted on 04/04/2012 for right axillary cleeulitis/skin lesions with neutropenia. He is on cytoxan for his ANCA vasculitis renal failure and follows up with Dr. Posey Pronto as outpatient. Renal service is consulted to manage his immunosuppressive meds in the setting of neutropenia. Cytoxan is on hold.   1. Neutropenia: s/p 1 dose of Neupogen. Likely secondary to cytoxan vs current infection. He presented with WBC- 0.9 with ANC of 200. Last WBC from 1 month ago was 4300. His WBC Is 3.2K with ANC of 2 today which has continued to improve since admission. With the appearance of his R axillary cellulitis and new oral ulcers there was a suspicion for viral infections like VZV. He was also noticed to have non erythematous maculopapular rash on the left arm and intertriginous areas. CT scan of Axilla is consistent with cellulitis..  Per primary team and ID .  -Continue vancomycin and zosyn. Started Valtrex on 5/24.  -viral cultures from axilla and mouth: will collect prior to discharge  2. 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 He presented to the hospital at his baseline. Cr continues to be stable( 1.4 today). He was getting treated with cyclophosphamide and prednisone. In February when he presented with renal vasculitis, the creatinine peaked at 6.5. He was treated with plasmapheresis, cytoxan and steroids and has gradually been improving each month. In March creatinine was down into the 3's, in April in the 2's and now creat is around 1.5 and urinalysis show no significant hematuria or proteinuria. This is all c/w remission of renal disease and cytoxan is the most likely cause of the remission. For now our plan is to hold cytoxan for several weeks until current infection issues are resolved, then to restart Cytoxan po at a lower dose,  around 100mg /d most likely, and titrate up if needed. Continue prednisone in the meantime at 10 mg/d. It is not clear whether Cytoxan caused the neutropenia or an acute infection was responsible, as I'm told that less than a month ago his WBC was stable around 4000 on the cytoxan, but Cytoxan BM toxicity is certainly is a possiblity.  - Continue to hold cytoxan  - continuing daily prednisone  - Continue to monitor his kidney function: cr is stable at 1.43.  3. Hypokalemia: Resolved. K- 3.7. It was likely secondary to lasix. Mg levels- 1.8 on 5/23.  4. Anemia: stable. His hemoglobin 10.3 ( baseline 10.5-11). Iron -26 and ferritin in 300's.For his anemia of chronic disease, he is ongoing Procrit therapy with 10,000 q. monthly with the last dose given May of 2013.  - Hold cytoxan.  - May consider starting oral iron as well (once infection clarified and controlled)   5.Hypertension : BP stable 123456 systolic. Continue Norvasc, Coreg, Lasix , flomax.  - Hydralazine was d/c on 5/23.   5.Diabetes mellitus type II: HbA1C- 7.4 on 5/22. CBGs range 150-200's.  -Continue Lantus and NovoLog  -Per primary   6. 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 S/p ergocalciferol supplementation. On Renvela  7. Dispo: discharge today per primary team   Length of Stay: 4 days  Patient history and plan of care reviewed with attending, Dr. Jonnie Finner.  Ansel Bong, MD  PGYII, Internal Medicine Resident 04/08/2012, 9:07 AM  Patient seen and examined and agree with assessment and plan as above. WBC is up and primary team is planning discharge likely for today.  Continue to hold po Cytoxan- renal MD will follow up in outpatient setting and possibly restart at a lower dose once the acute infection has resolved.  Keep on po steroids in the meantime. Will sign off.  Please call as needed.  Kelly Splinter  MD [facility] [phone] pgr    [phone] cell 04/08/2012, 12:56 PM

## 2012-04-08 NOTE — Progress Notes (Signed)
Dc papers discussed and signed. IV removed, tip intact. Rx given to patient to get filled. Pt left via wheelchair with tech.

## 2012-04-08 NOTE — Discharge Summary (Signed)
Physician Discharge Summary  Patient ID: Ryan Jimenez MRN: [id number] DOB/AGE: [birth year] 76 y.o.  Admit date: 04/04/2012 Discharge date: 04/08/2012  Discharge Diagnoses:   *Abscess of axillary region  Hidradenitis suppurativa of right axilla  Hypercholesterolemia  Hypertension  Renal failure  Diabetes mellitus type II  Neutropenia - Resolved now  Pancytopenia due to chemotherapy (cytoxan)   Anemia  Generalized weakness  Physical deconditioning  Viral Illness  Immunocompromised  Discharged Condition: good    Hospital Course:  1. Neutropenia: Likely secondary to cytoxan. Improving S/p neupogen x 1 dose and discontinued the cyclophosphamide, Valtrex started for viral treatment - to finish 3 more days post discharge. viral cultures from axilla and mouth pending at discharge. To follow up results with PCP.  WBC was 0.9 on admission and improved to 3.4 at day of discharge.  Pt will continue prednisone 10 mg daily until following up with his nephrologist.   .  2. 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 He has a new baseline~1.4. - Continue to hold cytoxan and continuing daily prednisone, Continuing to monitor his kidney function which is improving. Nephrology team followed pt in the hospital.   3. R axillary cellulitis: Clinically improving. Likely Hidradenitis suppurativa. CT scan of Axilla is consistent with cellulitis. Antibiotics given were Zosyn and Vancomycin - significant improvement, no surgical drainage required, topical cleocin gel used as well.  Pt discharged home on Augmentin BID.     3. Hypokalemia: Resolved. K- 4.1 likely secondary to lasix.   4. Anemia:His hemoglobin is ranging in 9 with baseline being 10.5-11. Iron -26 and ferritin in 300's.For his anemia of chronic disease, he is ongoing Procrit therapy with 10,000 q. monthly with the last  dose given May of 2013. - Holding cytoxan.   5.Hypertension : BP stable. On Norvasc, Coreg, Lasix , flomax.   5.Diabetes mellitus type II: HbA1C- 7.4 on 5/22. Uncontrolled but improving. Continue Lantus and NovoLog - Monitor BS closely and report to primary care physician.   6. 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 S/p ergocalciferol supplementation. On Renvela  7. Viral Infection and oral ulcers in mouth - Viral cultures pending at discharge.  Pt on Valtrex and lesions have healed up well.  Continue 3 more days of valtrex.  Follow up with PCP.    Consults: Infectious Diseases, Nephrology  Significant Diagnostic Studies: CT or Right Axillary Area consistent with cellulitis   Discharge Exam: The patient is awake, alert in no distress cooperative and pleasant, reports that he's feeling much better, reporting that his right axillary area feels much better.  There is less swelling edema and pain in the area.  The patient is asking to be discharged home today.  The patient reports that he will followup closely with his nephrologist and primary care physician.  He reports that he is able to get prescriptions filled with no difficulty. Blood pressure 165/81, pulse 63, temperature 98.7 F (37.1 C), temperature source Oral, resp. rate 18, height 5\' 9"  (1.753 m), weight 103.647 kg (228 lb 8 oz), SpO2 94.00%. Well-developed, well-nourished, in no acute distress; alert, appropriate and cooperative throughout examination.  Heent:oral ulcers in the right lateral side and hard palate  Lungs: Normal respiratory effort. Clear to auscultation BL without crackles or wheezes.  Heart: RRR. S1 and S2 normal without gallop, murmur, or rubs.  Abdomen: BS normoactive. Soft, Nondistended, non-tender. No masses or organomegaly.  Extremities: no  pretibial edema, R axillary swelling and tenderness markedly improved with some skin breakdown and blisters healing, multiple skin tags  Disposition: Home with Home PT/OT -  wife agreed to be his 24/7 caretaker  Discharge Orders    Future Orders Please Complete By Expires   Increase activity slowly      Discharge instructions      Comments:   Check your Blood sugar 4 times per day and report readings to primary care physician Please call to follow up with your kidney doctor later this week or next week for a hospital follow up.  Return if symptoms recur, worsen or new problems develop.       Medication List  As of 04/08/2012  8:55 AM   STOP taking these medications         amoxicillin 500 MG tablet      cyclophosphamide 50 MG tablet         TAKE these medications         ACIDOPHILUS PROBIOTIC 100 MG Caps   Take 1 capsule (100 mg total) by mouth 3 (three) times daily.      allopurinol 100 MG tablet   Commonly known as: ZYLOPRIM   Take 100 mg by mouth daily.      amLODipine 10 MG tablet   Commonly known as: NORVASC   Take 1 tablet (10 mg total) by mouth daily.      amoxicillin-clavulanate 875-125 MG per tablet   Commonly known as: AUGMENTIN   Take 1 tablet by mouth 2 (two) times daily.      aspirin 81 MG EC tablet   Take 81 mg by mouth daily. Swallow whole.      carvedilol 12.5 MG tablet   Commonly known as: COREG   Take 12.5 mg by mouth 2 (two) times daily with a meal.      clindamycin 1 % gel   Commonly known as: CLINDAGEL   Apply topically 2 (two) times daily.      colchicine 0.6 MG tablet   Take 0.6 mg by mouth 2 (two) times daily as needed. For gout      dutasteride 0.5 MG capsule   Commonly known as: AVODART   Take 0.5 mg by mouth daily.      feeding supplement Liqd   Take 237 mLs by mouth daily.      furosemide 40 MG tablet   Commonly known as: LASIX   Take 40 mg by mouth daily.      hydrALAZINE 100 MG tablet   Commonly known as: APRESOLINE   Take 1 tablet (100 mg total) by mouth 2 (two) times daily.      HYDROcodone-homatropine 5-1.5 MG/5ML syrup   Commonly known as: HYCODAN   Take 5 mLs by mouth every 6 (six)  hours as needed. For cough      insulin glargine 100 UNIT/ML injection   Commonly known as: LANTUS   Inject 15 Units into the skin at bedtime.      megestrol 40 MG/ML suspension   Commonly known as: MEGACE   Take 400 mg by mouth daily.      NOVOLOG FLEXPEN 100 UNIT/ML injection   Generic drug: insulin aspart   Inject 2-10 Units into the skin 2 (two) times daily before a meal. Per SSI      predniSONE 10 MG tablet   Commonly known as: DELTASONE   Take 10 mg by mouth daily.      sevelamer 800 MG tablet  Commonly known as: RENVELA   Take 1 tablet (800 mg total) by mouth 3 (three) times daily with meals.      Tamsulosin HCl 0.4 MG Caps   Commonly known as: FLOMAX   Take 0.4 mg by mouth at bedtime.      valACYclovir 500 MG tablet   Commonly known as: VALTREX   Take 1 tablet (500 mg total) by mouth 2 (two) times daily.      Vitamin D (Ergocalciferol) 50000 UNITS Caps   Commonly known as: DRISDOL   Take 50,000 Units by mouth every 7 (seven) days. Take on sundays           Follow-up Information    Follow up with AVBUERE,EDWIN A, MD. Schedule an appointment as soon as possible for a visit in 1 week. [facility] FOLLOW UP RECHECK CBC)    Contact information:   [address] [place] [phone]       Follow up with Ulla Potash., MD. Schedule an appointment as soon as possible for a visit in 2 weeks. (HOSPITAL FOLLOW UP RECHECK BMP)    Contact information:   [place] [phone]         I spent 37 minutes preparing this patient's discharge, reviewing medical records, writing prescriptions, reviewing consultation notes and counseling with patient and arranging for follow up care with home health physical therapy and occupational therapy.  SignedIrwin Brakeman 04/08/2012, 8:55 AM Pager [phone]

## 2012-04-10 LAB — CULTURE, BLOOD (ROUTINE X 2): Culture: NO GROWTH

## 2012-04-10 LAB — GLUCOSE, CAPILLARY: Glucose-Capillary: 268 mg/dL — ABNORMAL HIGH (ref 70–99)

## 2012-04-16 LAB — VIRAL CULTURE VIRC

## 2012-04-18 ENCOUNTER — Other Ambulatory Visit (HOSPITAL_COMMUNITY): Payer: Self-pay | Admitting: *Deleted

## 2012-04-19 ENCOUNTER — Encounter (HOSPITAL_COMMUNITY)
Admission: RE | Admit: 2012-04-19 | Discharge: 2012-04-19 | Disposition: A | Discharge status: Home / Self Care | Payer: Medicare Other | Source: Ambulatory Visit | Attending: Nephrology | Admitting: Nephrology

## 2012-04-19 DIAGNOSIS — D649 Anemia, unspecified: Secondary | ICD-10-CM | POA: Insufficient documentation

## 2012-04-19 DIAGNOSIS — N184 Chronic kidney disease, stage 4 (severe): Secondary | ICD-10-CM | POA: Insufficient documentation

## 2012-04-19 LAB — BASIC METABOLIC PANEL
CO2: 26 mEq/L (ref 19–32)
Calcium: 9 mg/dL (ref 8.4–10.5)
Creatinine, Ser: 1.34 mg/dL (ref 0.50–1.35)
GFR calc non Af Amer: 49 mL/min — ABNORMAL LOW (ref >90)

## 2012-04-19 LAB — POCT HEMOGLOBIN-HEMACUE: Hemoglobin: 10.8 g/dL — ABNORMAL LOW (ref 13.0–17.0)

## 2012-04-19 LAB — IRON AND TIBC
Iron: 65 ug/dL (ref 42–135)
TIBC: 218 ug/dL (ref 215–435)
UIBC: 153 ug/dL (ref 125–400)

## 2012-04-19 MED ORDER — EPOETIN ALFA 20000 UNIT/ML IJ SOLN
20000.0000 [IU] | INTRAMUSCULAR | Status: DC
  Administered 2012-04-19: 20000 [IU] via SUBCUTANEOUS

## 2012-04-19 MED ORDER — EPOETIN ALFA 20000 UNIT/ML IJ SOLN
INTRAMUSCULAR | Status: AC
  Administered 2012-04-19: 20000 [IU] via SUBCUTANEOUS
  Filled 2012-04-19: qty 1

## 2012-04-24 ENCOUNTER — Encounter (HOSPITAL_COMMUNITY)
Admission: RE | Admit: 2012-04-24 | Discharge: 2012-04-24 | Disposition: A | Discharge status: Home / Self Care | Payer: Medicare Other | Source: Ambulatory Visit | Attending: Nephrology | Admitting: Nephrology

## 2012-04-24 LAB — MAGNESIUM: Magnesium: 1.9 mg/dL (ref 1.5–2.5)

## 2012-04-24 LAB — RENAL FUNCTION PANEL
CO2: 27 mEq/L (ref 19–32)
Calcium: 9.5 mg/dL (ref 8.4–10.5)
Creatinine, Ser: 1.61 mg/dL — ABNORMAL HIGH (ref 0.50–1.35)
Glucose, Bld: 126 mg/dL — ABNORMAL HIGH (ref 70–99)
Phosphorus: 4.3 mg/dL (ref 2.3–4.6)

## 2012-04-24 LAB — URINALYSIS, MICROSCOPIC ONLY
Leukocytes, UA: NEGATIVE
Nitrite: NEGATIVE
Specific Gravity, Urine: 1.015 (ref 1.005–1.030)
Urobilinogen, UA: 0.2 mg/dL (ref 0.0–1.0)

## 2012-04-24 LAB — PROTEIN / CREATININE RATIO, URINE
Protein Creatinine Ratio: 0.12 (ref 0.00–0.15)
Total Protein, Urine: 13.3 mg/dL

## 2012-04-24 MED ORDER — EPOETIN ALFA 20000 UNIT/ML IJ SOLN
INTRAMUSCULAR | Status: AC
  Administered 2012-04-24: 20000 [IU] via SUBCUTANEOUS
  Filled 2012-04-24: qty 1

## 2012-04-24 MED ORDER — EPOETIN ALFA 20000 UNIT/ML IJ SOLN
20000.0000 [IU] | INTRAMUSCULAR | Status: DC
  Administered 2012-04-24: 20000 [IU] via SUBCUTANEOUS

## 2012-04-27 ENCOUNTER — Other Ambulatory Visit (HOSPITAL_COMMUNITY): Payer: Self-pay | Admitting: *Deleted

## 2012-05-01 ENCOUNTER — Encounter (HOSPITAL_COMMUNITY)
Admission: RE | Admit: 2012-05-01 | Discharge: 2012-05-01 | Disposition: A | Discharge status: Home / Self Care | Payer: Medicare Other | Source: Ambulatory Visit | Attending: Nephrology | Admitting: Nephrology

## 2012-05-01 MED ORDER — EPOETIN ALFA 20000 UNIT/ML IJ SOLN
20000.0000 [IU] | INTRAMUSCULAR | Status: DC
  Administered 2012-05-01: 20000 [IU] via SUBCUTANEOUS
  Filled 2012-05-01: qty 1

## 2012-05-08 ENCOUNTER — Encounter (HOSPITAL_COMMUNITY)
Admission: RE | Admit: 2012-05-08 | Discharge: 2012-05-08 | Disposition: A | Discharge status: Home / Self Care | Payer: Medicare Other | Source: Ambulatory Visit | Attending: Nephrology | Admitting: Nephrology

## 2012-05-08 LAB — POCT HEMOGLOBIN-HEMACUE: Hemoglobin: 12.7 g/dL — ABNORMAL LOW (ref 13.0–17.0)

## 2012-05-08 MED ORDER — EPOETIN ALFA 20000 UNIT/ML IJ SOLN: 20000.0000 [IU] | INTRAMUSCULAR | Status: DC

## 2012-05-22 ENCOUNTER — Encounter (HOSPITAL_COMMUNITY)
Admission: RE | Admit: 2012-05-22 | Discharge: 2012-05-22 | Disposition: A | Discharge status: Home / Self Care | Payer: Medicare Other | Source: Ambulatory Visit | Attending: Nephrology | Admitting: Nephrology

## 2012-05-22 DIAGNOSIS — D649 Anemia, unspecified: Secondary | ICD-10-CM | POA: Insufficient documentation

## 2012-05-22 DIAGNOSIS — N184 Chronic kidney disease, stage 4 (severe): Secondary | ICD-10-CM | POA: Insufficient documentation

## 2012-05-22 LAB — RENAL FUNCTION PANEL
BUN: 23 mg/dL (ref 6–23)
Calcium: 9.5 mg/dL (ref 8.4–10.5)
Creatinine, Ser: 1.4 mg/dL — ABNORMAL HIGH (ref 0.50–1.35)
Glucose, Bld: 213 mg/dL — ABNORMAL HIGH (ref 70–99)
Phosphorus: 3.8 mg/dL (ref 2.3–4.6)
Sodium: 141 mEq/L (ref 135–145)

## 2012-05-22 LAB — IRON AND TIBC
Iron: 215 ug/dL — ABNORMAL HIGH (ref 42–135)
UIBC: <15 ug/dL — ABNORMAL LOW (ref 125–400)

## 2012-05-22 LAB — DIFFERENTIAL
Basophils Absolute: 0 10*3/uL (ref 0.0–0.1)
Eosinophils Absolute: 0.2 10*3/uL (ref 0.0–0.7)
Lymphocytes Relative: 51 % — ABNORMAL HIGH (ref 12–46)
Lymphs Abs: 0.3 10*3/uL — ABNORMAL LOW (ref 0.7–4.0)
Neutro Abs: 0.2 10*3/uL — ABNORMAL LOW (ref 1.7–7.7)

## 2012-05-22 LAB — CBC
HCT: 30.7 % — ABNORMAL LOW (ref 39.0–52.0)
MCHC: 32.9 g/dL (ref 30.0–36.0)
RDW: 14.8 % (ref 11.5–15.5)

## 2012-05-22 MED ORDER — EPOETIN ALFA 20000 UNIT/ML IJ SOLN
20000.0000 [IU] | INTRAMUSCULAR | Status: DC
  Administered 2012-05-22: 20000 [IU] via SUBCUTANEOUS

## 2012-05-22 NOTE — Progress Notes (Signed)
Received call from Lab with panic WBC 0.7; left message at [place] with "Alinda Sierras".

## 2012-05-23 ENCOUNTER — Other Ambulatory Visit (HOSPITAL_COMMUNITY): Payer: Self-pay | Admitting: *Deleted

## 2012-05-24 ENCOUNTER — Encounter (HOSPITAL_COMMUNITY)
Admission: RE | Admit: 2012-05-24 | Discharge: 2012-05-24 | Disposition: A | Discharge status: Home / Self Care | Payer: Medicare Other | Source: Ambulatory Visit | Attending: Nephrology | Admitting: Nephrology

## 2012-05-24 LAB — URINALYSIS, MICROSCOPIC ONLY
Bilirubin Urine: NEGATIVE
Hgb urine dipstick: NEGATIVE
Ketones, ur: NEGATIVE mg/dL
Protein, ur: NEGATIVE mg/dL
Urobilinogen, UA: 1 mg/dL (ref 0.0–1.0)

## 2012-05-24 LAB — ANCA TITERS: P-ANCA: 1:40 {titer} — ABNORMAL HIGH

## 2012-05-24 MED ORDER — FILGRASTIM 300 MCG/ML IJ SOLN
150.0000 ug | Freq: Once | INTRAMUSCULAR | Status: AC
  Administered 2012-05-24: 150 ug via SUBCUTANEOUS
  Filled 2012-05-24: qty 1

## 2012-05-26 LAB — URINE CULTURE: Colony Count: 40000

## 2012-05-29 ENCOUNTER — Encounter (HOSPITAL_COMMUNITY)
Admission: RE | Admit: 2012-05-29 | Discharge: 2012-05-29 | Disposition: A | Discharge status: Home / Self Care | Payer: Medicare Other | Source: Ambulatory Visit | Attending: Nephrology | Admitting: Nephrology

## 2012-05-29 MED ORDER — EPOETIN ALFA 20000 UNIT/ML IJ SOLN
20000.0000 [IU] | INTRAMUSCULAR | Status: DC
  Administered 2012-05-29: 20000 [IU] via SUBCUTANEOUS
  Filled 2012-05-29: qty 1

## 2012-06-04 ENCOUNTER — Other Ambulatory Visit (HOSPITAL_COMMUNITY): Payer: Self-pay | Admitting: *Deleted

## 2012-06-05 ENCOUNTER — Encounter (HOSPITAL_COMMUNITY)
Admission: RE | Admit: 2012-06-05 | Discharge: 2012-06-05 | Disposition: A | Discharge status: Home / Self Care | Payer: Medicare Other | Source: Ambulatory Visit | Attending: Nephrology | Admitting: Nephrology

## 2012-06-05 LAB — URINALYSIS, MICROSCOPIC ONLY
Bilirubin Urine: NEGATIVE
Hgb urine dipstick: NEGATIVE
Specific Gravity, Urine: 1.007 (ref 1.005–1.030)
pH: 7 (ref 5.0–8.0)

## 2012-06-05 LAB — DIFFERENTIAL
Basophils Absolute: 0 10*3/uL (ref 0.0–0.1)
Basophils Relative: 1 % (ref 0–1)
Eosinophils Relative: 1 % (ref 0–5)
Monocytes Absolute: 1.1 10*3/uL — ABNORMAL HIGH (ref 0.1–1.0)

## 2012-06-05 LAB — RENAL FUNCTION PANEL
Albumin: 3.5 g/dL (ref 3.5–5.2)
BUN: 21 mg/dL (ref 6–23)
Chloride: 103 mEq/L (ref 96–112)
Creatinine, Ser: 1.51 mg/dL — ABNORMAL HIGH (ref 0.50–1.35)

## 2012-06-05 LAB — CBC
HCT: 34.3 % — ABNORMAL LOW (ref 39.0–52.0)
MCV: 92.2 fL (ref 78.0–100.0)
RDW: 16.2 % — ABNORMAL HIGH (ref 11.5–15.5)
WBC: 6.8 10*3/uL (ref 4.0–10.5)

## 2012-06-05 MED ORDER — EPOETIN ALFA 20000 UNIT/ML IJ SOLN
INTRAMUSCULAR | Status: AC
  Administered 2012-06-05: 20000 [IU] via SUBCUTANEOUS
  Filled 2012-06-05: qty 1

## 2012-06-05 MED ORDER — EPOETIN ALFA 20000 UNIT/ML IJ SOLN
20000.0000 [IU] | INTRAMUSCULAR | Status: DC
  Administered 2012-06-05: 20000 [IU] via SUBCUTANEOUS

## 2012-06-07 LAB — ANCA SCREEN W REFLEX TITER
c-ANCA Screen: NEGATIVE
p-ANCA Screen: POSITIVE — AB

## 2012-06-08 ENCOUNTER — Emergency Department (HOSPITAL_COMMUNITY)
Admission: EM | Admit: 2012-06-08 | Discharge: 2012-06-08 | Disposition: A | Discharge status: Home / Self Care | Payer: Medicare Other | Attending: Emergency Medicine | Admitting: Emergency Medicine

## 2012-06-08 ENCOUNTER — Encounter (HOSPITAL_COMMUNITY): Payer: Self-pay

## 2012-06-08 DIAGNOSIS — Z9849 Cataract extraction status, unspecified eye: Secondary | ICD-10-CM | POA: Insufficient documentation

## 2012-06-08 DIAGNOSIS — E162 Hypoglycemia, unspecified: Secondary | ICD-10-CM

## 2012-06-08 DIAGNOSIS — E7800 Pure hypercholesterolemia, unspecified: Secondary | ICD-10-CM | POA: Insufficient documentation

## 2012-06-08 DIAGNOSIS — I10 Essential (primary) hypertension: Secondary | ICD-10-CM | POA: Insufficient documentation

## 2012-06-08 DIAGNOSIS — M109 Gout, unspecified: Secondary | ICD-10-CM | POA: Insufficient documentation

## 2012-06-08 DIAGNOSIS — Z8249 Family history of ischemic heart disease and other diseases of the circulatory system: Secondary | ICD-10-CM | POA: Insufficient documentation

## 2012-06-08 DIAGNOSIS — Z87891 Personal history of nicotine dependence: Secondary | ICD-10-CM | POA: Insufficient documentation

## 2012-06-08 DIAGNOSIS — N189 Chronic kidney disease, unspecified: Secondary | ICD-10-CM | POA: Insufficient documentation

## 2012-06-08 DIAGNOSIS — Z794 Long term (current) use of insulin: Secondary | ICD-10-CM | POA: Insufficient documentation

## 2012-06-08 DIAGNOSIS — E119 Type 2 diabetes mellitus without complications: Secondary | ICD-10-CM | POA: Insufficient documentation

## 2012-06-08 DIAGNOSIS — M129 Arthropathy, unspecified: Secondary | ICD-10-CM | POA: Insufficient documentation

## 2012-06-08 LAB — POCT I-STAT, CHEM 8
Creatinine, Ser: 1.7 mg/dL — ABNORMAL HIGH (ref 0.50–1.35)
Glucose, Bld: 140 mg/dL — ABNORMAL HIGH (ref 70–99)
Hemoglobin: 10.5 g/dL — ABNORMAL LOW (ref 13.0–17.0)
TCO2: 24 mmol/L (ref 0–100)

## 2012-06-08 LAB — GLUCOSE, CAPILLARY: Glucose-Capillary: 162 mg/dL — ABNORMAL HIGH (ref 70–99)

## 2012-06-08 MED ORDER — POTASSIUM CHLORIDE CRYS ER 20 MEQ PO TBCR
40.0000 meq | EXTENDED_RELEASE_TABLET | Freq: Once | ORAL | Status: AC
  Administered 2012-06-08: 40 meq via ORAL
  Filled 2012-06-08: qty 2

## 2012-06-08 MED ORDER — DEXTROSE 50 % IV SOLN
INTRAVENOUS | Status: AC
  Filled 2012-06-08: qty 50

## 2012-06-08 NOTE — ED Notes (Signed)
Pt came in through EMS with wife. Pt wife stated that about a hour ago she checked the pts  Blood sugar and it was 45. Pt states she gave him 8ozs of orange juice but he only drunk about 3ozs of the juice, per ems pt cbg was 125. Rechecked pt blood sugar and it is 87. Pt denies pain.

## 2012-06-08 NOTE — ED Provider Notes (Signed)
History     CSN: [id number]  Arrival date & time 06/08/12  0235   First MD Initiated Contact with Patient 06/08/12 [phone]      Chief Complaint  Patient presents with  . Hypoglycemia    (Consider location/radiation/quality/duration/timing/severity/associated sxs/prior treatment) HPI Comments: Patient presents today with an episode of hypoglycemia. His wife states that He woke up tonight diaphoretic and they checked his blood sugar and it was 45. She attends some orange juice and upon EMS arrival his blood sugar is 125. He states he's been ED fine today and that he has not changed his insulin dose. He normally takes insulin 15 units in the evening which he did take tonight about 10:00. He also takes regular insulin throughout the day. He's had had a history of some recent renal insufficiency which has been treated by his nephrologist. No other recent illnesses.  The history is provided by the patient.    Past Medical History  Diagnosis Date  . Hypertension   . Diabetes mellitus   . Hypercholesterolemia   . Gout   . Shortness of breath on exertion   . Bronchitis   . Arthritis   . Near syncope 10/2011; 11/2011  . Renal disorder   . CKD (chronic kidney disease)   . Glomerulonephritis   . Pancytopenia   . Anemia     Past Surgical History  Procedure Date  . Hydrocele excision   . Cataract extraction w/ intraocular lens implant ~ 2009    right    Family History  Problem Relation Age of Onset  . Hypertension Mother   . Heart disease Mother   . Diabetes Sister     History  Substance Use Topics  . Smoking status: Former Smoker -- 1.0 packs/day for 40 years    Types: Cigarettes  . Smokeless tobacco: Never Used  . Alcohol Use: No     11/23/11 "couple ounces q month maybe"      Review of Systems  Constitutional: Negative for fever, chills, diaphoresis and fatigue.  HENT: Negative for congestion, rhinorrhea and sneezing.   Eyes: Negative.   Respiratory: Negative for  cough, chest tightness and shortness of breath.   Cardiovascular: Negative for chest pain and leg swelling.  Gastrointestinal: Negative for nausea, vomiting, abdominal pain, diarrhea and blood in stool.  Genitourinary: Negative for frequency, hematuria, flank pain and difficulty urinating.  Musculoskeletal: Negative for back pain and arthralgias.  Skin: Negative for rash.  Neurological: Negative for dizziness, speech difficulty, weakness, numbness and headaches.    Allergies  Review of patient's allergies indicates no known allergies.  Home Medications   Current Outpatient Rx  Name Route Sig Dispense Refill  . AMLODIPINE BESYLATE 10 MG PO TABS Oral Take 1 tablet (10 mg total) by mouth daily. 30 tablet 3  . ASPIRIN 81 MG PO TBEC Oral Take 81 mg by mouth daily. Swallow whole.    [name] CARVEDILOL 12.5 MG PO TABS Oral Take 12.5 mg by mouth 2 (two) times daily with a meal.    . CYCLOPHOSPHAMIDE 50 MG PO TABS Oral Take 50 mg by mouth daily. Give on an empty stomach 1 hour before or 2 hours after meals.    . DUTASTERIDE 0.5 MG PO CAPS Oral Take 0.5 mg by mouth daily.     . FUROSEMIDE 40 MG PO TABS Oral Take 40 mg by mouth daily.    [name] HYDRALAZINE HCL 100 MG PO TABS Oral Take 1 tablet (100 mg total) by mouth 2 (two)  times daily. 60 tablet 3  . INSULIN ASPART 100 UNIT/ML [place] SOLN Subcutaneous Inject 2-10 Units into the skin 2 (two) times daily before a meal. Per SSI    . INSULIN GLARGINE 100 UNIT/ML [place] SOLN Subcutaneous Inject 15 Units into the skin at bedtime. 10 mL 0  . ACIDOPHILUS PROBIOTIC 100 MG PO CAPS Oral Take 1 capsule (100 mg total) by mouth 3 (three) times daily. 90 capsule 0  . PREDNISONE 10 MG PO TABS Oral Take 10 mg by mouth daily.    [name] TAMSULOSIN HCL 0.4 MG PO CAPS Oral Take 0.4 mg by mouth at bedtime.    [name] VITAMIN D (ERGOCALCIFEROL) 50000 UNITS PO CAPS Oral Take 50,000 Units by mouth every 7 (seven) days. Take on sundays    . CLINDAMYCIN PHOSPHATE 1 % EX GEL Topical Apply topically 2  (two) times daily. 30 g 0  . GLUCERNA SHAKE PO LIQD Oral Take 237 mLs by mouth daily. 6 Can 0    There were no vitals taken for this visit.  Physical Exam  Constitutional: He is oriented to person, place, and time. He appears well-developed and well-nourished.  HENT:  Head: Normocephalic and atraumatic.  Eyes: Pupils are equal, round, and reactive to light.  Neck: Normal range of motion. Neck supple.  Cardiovascular: Normal rate, regular rhythm and normal heart sounds.   Pulmonary/Chest: Effort normal and breath sounds normal. No respiratory distress. He has no wheezes. He has no rales. He exhibits no tenderness.  Abdominal: Soft. Bowel sounds are normal. There is no tenderness. There is no rebound and no guarding.  Musculoskeletal: Normal range of motion. He exhibits edema.  Lymphadenopathy:    He has no cervical adenopathy.  Neurological: He is alert and oriented to person, place, and time.  Skin: Skin is warm and dry. No rash noted.  Psychiatric: He has a normal mood and affect.    ED Course  Procedures (including critical care time)  Results for orders placed during the hospital encounter of 06/08/12  GLUCOSE, CAPILLARY      Component Value Range   Glucose-Capillary 87  70 - 99 mg/dL  POCT I-STAT, CHEM 8      Component Value Range   Sodium 141  135 - 145 mEq/L   Potassium 2.9 (*) 3.5 - 5.1 mEq/L   Chloride 103  96 - 112 mEq/L   BUN 25 (*) 6 - 23 mg/dL   Creatinine, Ser 1.70 (*) 0.50 - 1.35 mg/dL   Glucose, Bld 140 (*) 70 - 99 mg/dL   Calcium, Ion 1.16  1.13 - 1.30 mmol/L   TCO2 24  0 - 100 mmol/L   Hemoglobin 10.5 (*) 13.0 - 17.0 g/dL   HCT 31.0 (*) 39.0 - 52.0 %  GLUCOSE, CAPILLARY      Component Value Range   Glucose-Capillary 162 (*) 70 - 99 mg/dL   No results found.   1. Hypoglycemia, unspecified       MDM  Patient with an episode of hypoglycemia. He's had no episodes of hypoglycemia in the 3 hours these been here in the emergency department. He has been  eating here. His creatinine is slightly higher than on his past lab work. He also has hypokalemia for which she was given some potassium replacement here. He is on Lasix which likely explains the hypokalemia. I will advise him to decrease his Lantus to 10 UNITS instead of 15 units and call his doctor today for followup. Also advised the wife to keep  a close eye on his blood sugars for the next several days        Malvin Johns, MD 06/08/12 [phone]

## 2012-06-08 NOTE — ED Notes (Signed)
Family at bedside. 

## 2012-06-12 ENCOUNTER — Encounter (HOSPITAL_COMMUNITY)
Admission: RE | Admit: 2012-06-12 | Discharge: 2012-06-12 | Disposition: A | Discharge status: Home / Self Care | Payer: Medicare Other | Source: Ambulatory Visit | Attending: Nephrology | Admitting: Nephrology

## 2012-06-12 LAB — POCT HEMOGLOBIN-HEMACUE: Hemoglobin: 11.1 g/dL — ABNORMAL LOW (ref 13.0–17.0)

## 2012-06-12 MED ORDER — EPOETIN ALFA 20000 UNIT/ML IJ SOLN
20000.0000 [IU] | INTRAMUSCULAR | Status: DC
  Administered 2012-06-12: 20000 [IU] via SUBCUTANEOUS

## 2012-06-12 MED ORDER — EPOETIN ALFA 20000 UNIT/ML IJ SOLN
INTRAMUSCULAR | Status: AC
  Filled 2012-06-12: qty 1

## 2012-06-18 ENCOUNTER — Other Ambulatory Visit (HOSPITAL_COMMUNITY): Payer: Self-pay | Admitting: *Deleted

## 2012-06-19 ENCOUNTER — Encounter (HOSPITAL_COMMUNITY)
Admission: RE | Admit: 2012-06-19 | Discharge: 2012-06-19 | Disposition: A | Discharge status: Home / Self Care | Payer: Medicare Other | Source: Ambulatory Visit | Attending: Nephrology | Admitting: Nephrology

## 2012-06-19 DIAGNOSIS — N184 Chronic kidney disease, stage 4 (severe): Secondary | ICD-10-CM | POA: Insufficient documentation

## 2012-06-19 DIAGNOSIS — D649 Anemia, unspecified: Secondary | ICD-10-CM | POA: Insufficient documentation

## 2012-06-19 LAB — CBC
HCT: 35.5 % — ABNORMAL LOW (ref 39.0–52.0)
MCH: 30.1 pg (ref 26.0–34.0)
MCV: 91.3 fL (ref 78.0–100.0)
RDW: 16 % — ABNORMAL HIGH (ref 11.5–15.5)
WBC: 6.4 10*3/uL (ref 4.0–10.5)

## 2012-06-19 LAB — DIFFERENTIAL
Basophils Absolute: 0 10*3/uL (ref 0.0–0.1)
Eosinophils Absolute: 0.6 10*3/uL (ref 0.0–0.7)
Eosinophils Relative: 10 % — ABNORMAL HIGH (ref 0–5)
Lymphocytes Relative: 20 % (ref 12–46)
Monocytes Absolute: 0.8 10*3/uL (ref 0.1–1.0)

## 2012-06-19 MED ORDER — EPOETIN ALFA 20000 UNIT/ML IJ SOLN
20000.0000 [IU] | INTRAMUSCULAR | Status: DC
  Administered 2012-06-19: 20000 [IU] via SUBCUTANEOUS
  Filled 2012-06-19: qty 1

## 2012-06-20 LAB — IRON AND TIBC: Iron: 40 ug/dL — ABNORMAL LOW (ref 42–135)

## 2012-06-26 ENCOUNTER — Encounter (HOSPITAL_COMMUNITY)
Admission: RE | Admit: 2012-06-26 | Discharge: 2012-06-26 | Disposition: A | Discharge status: Home / Self Care | Payer: Medicare Other | Source: Ambulatory Visit | Attending: Nephrology | Admitting: Nephrology

## 2012-06-26 MED ORDER — EPOETIN ALFA 20000 UNIT/ML IJ SOLN: 20000.0000 [IU] | INTRAMUSCULAR | Status: DC

## 2012-07-09 ENCOUNTER — Other Ambulatory Visit (HOSPITAL_COMMUNITY): Payer: Self-pay | Admitting: *Deleted

## 2012-07-10 ENCOUNTER — Encounter (HOSPITAL_COMMUNITY)
Admission: RE | Admit: 2012-07-10 | Discharge: 2012-07-10 | Disposition: A | Discharge status: Home / Self Care | Payer: Medicare Other | Source: Ambulatory Visit | Attending: Nephrology | Admitting: Nephrology

## 2012-07-10 MED ORDER — EPOETIN ALFA 20000 UNIT/ML IJ SOLN
INTRAMUSCULAR | Status: AC
  Administered 2012-07-10: 20000 [IU] via SUBCUTANEOUS
  Filled 2012-07-10: qty 1

## 2012-07-10 MED ORDER — SODIUM CHLORIDE 0.9 % IV SOLN: INTRAVENOUS | Status: DC

## 2012-07-10 MED ORDER — EPOETIN ALFA 20000 UNIT/ML IJ SOLN: 20000.0000 [IU] | INTRAMUSCULAR | Status: DC

## 2012-07-10 MED ORDER — FERUMOXYTOL INJECTION 510 MG/17 ML
510.0000 mg | Freq: Once | INTRAVENOUS | Status: AC
  Administered 2012-07-10: 510 mg via INTRAVENOUS
  Filled 2012-07-10: qty 17

## 2012-07-18 ENCOUNTER — Encounter (HOSPITAL_COMMUNITY)
Admission: RE | Admit: 2012-07-18 | Discharge: 2012-07-18 | Disposition: A | Discharge status: Home / Self Care | Payer: Medicare Other | Source: Ambulatory Visit | Attending: Nephrology | Admitting: Nephrology

## 2012-07-18 DIAGNOSIS — D649 Anemia, unspecified: Secondary | ICD-10-CM | POA: Insufficient documentation

## 2012-07-18 DIAGNOSIS — N184 Chronic kidney disease, stage 4 (severe): Secondary | ICD-10-CM | POA: Insufficient documentation

## 2012-07-18 LAB — IRON AND TIBC
Saturation Ratios: 26 % (ref 20–55)
TIBC: 195 ug/dL — ABNORMAL LOW (ref 215–435)

## 2012-07-18 LAB — POCT HEMOGLOBIN-HEMACUE: Hemoglobin: 13.1 g/dL (ref 13.0–17.0)

## 2012-07-18 MED ORDER — EPOETIN ALFA 20000 UNIT/ML IJ SOLN: 20000.0000 [IU] | INTRAMUSCULAR | Status: DC

## 2012-07-23 IMAGING — XA IR REMOVAL TUNNELED CV CATH
1 series · 2 of 2 positions shown · non-contrast
Comparison: none

Clinical Data/Indication: Renal insufficiency resolved.  Dialysis
no longer needed.

TUNNEL CATHETER REMOVAL
Procedure: The procedure, risks, benefits, and alternatives were
explained to the patient. Questions regarding the procedure were
encouraged and answered. The patient understands and consents to
the procedure.
The right chest and neck were prepped with betadine in a sterile
fashion, and a sterile drape was applied covering the operative
field. A sterile gown and sterile gloves were used for the
procedure.
1% lidocaine was utilized for local anesthesia. Utilizing blunt
dissection, the cuff of the catheter was freed from the underlying
subcutaneous tissue. The catheter was removed in its entirety.
Hemostasis was achieved with direct pressure.
Complications: None

[Series 1: run · 2 of 2 slices shown]
[im 1/2]
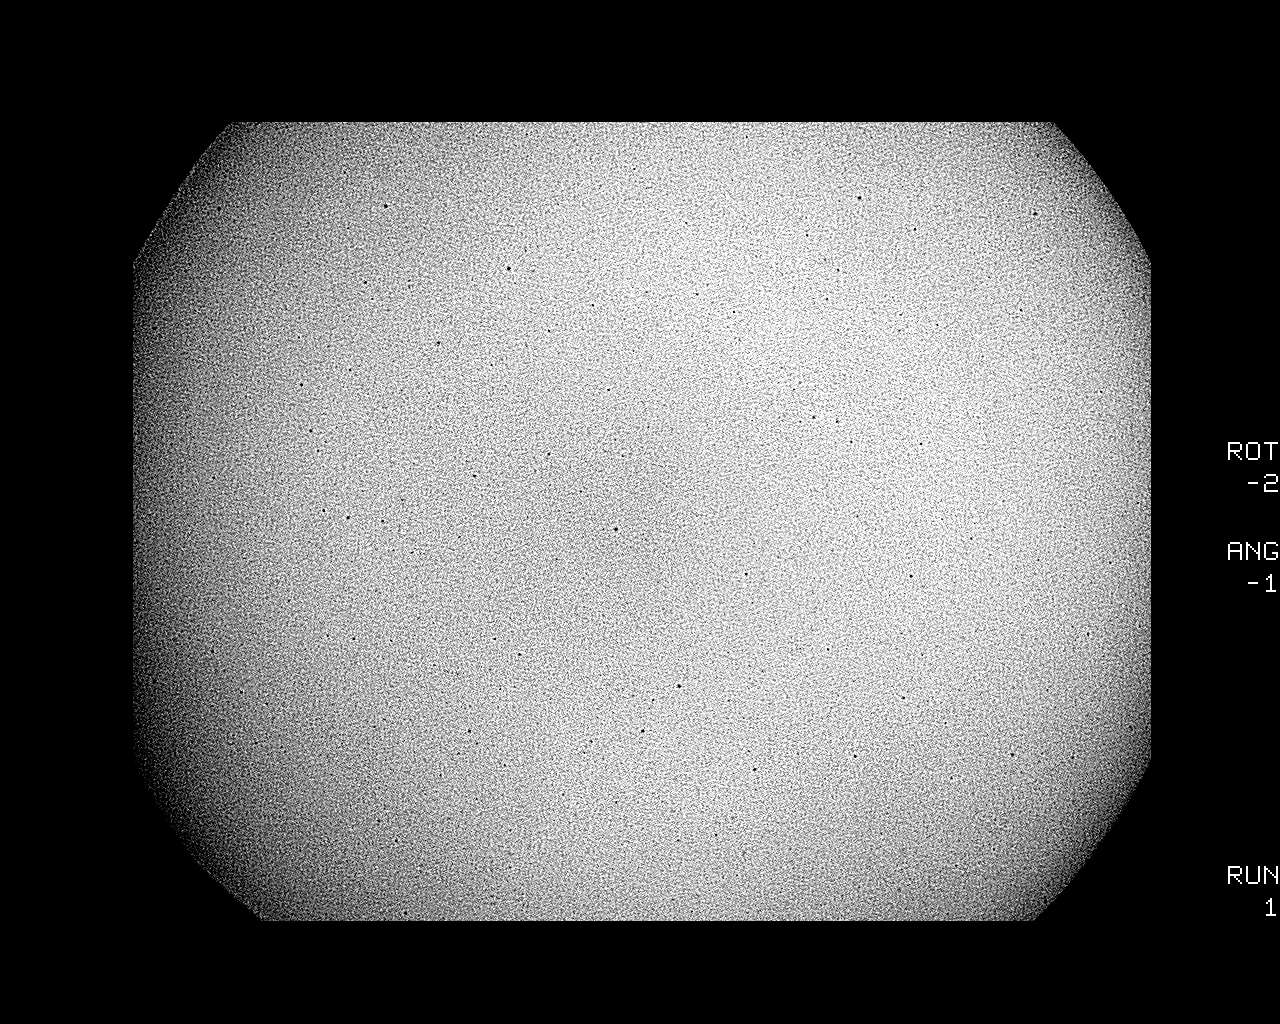
[im 2/2]
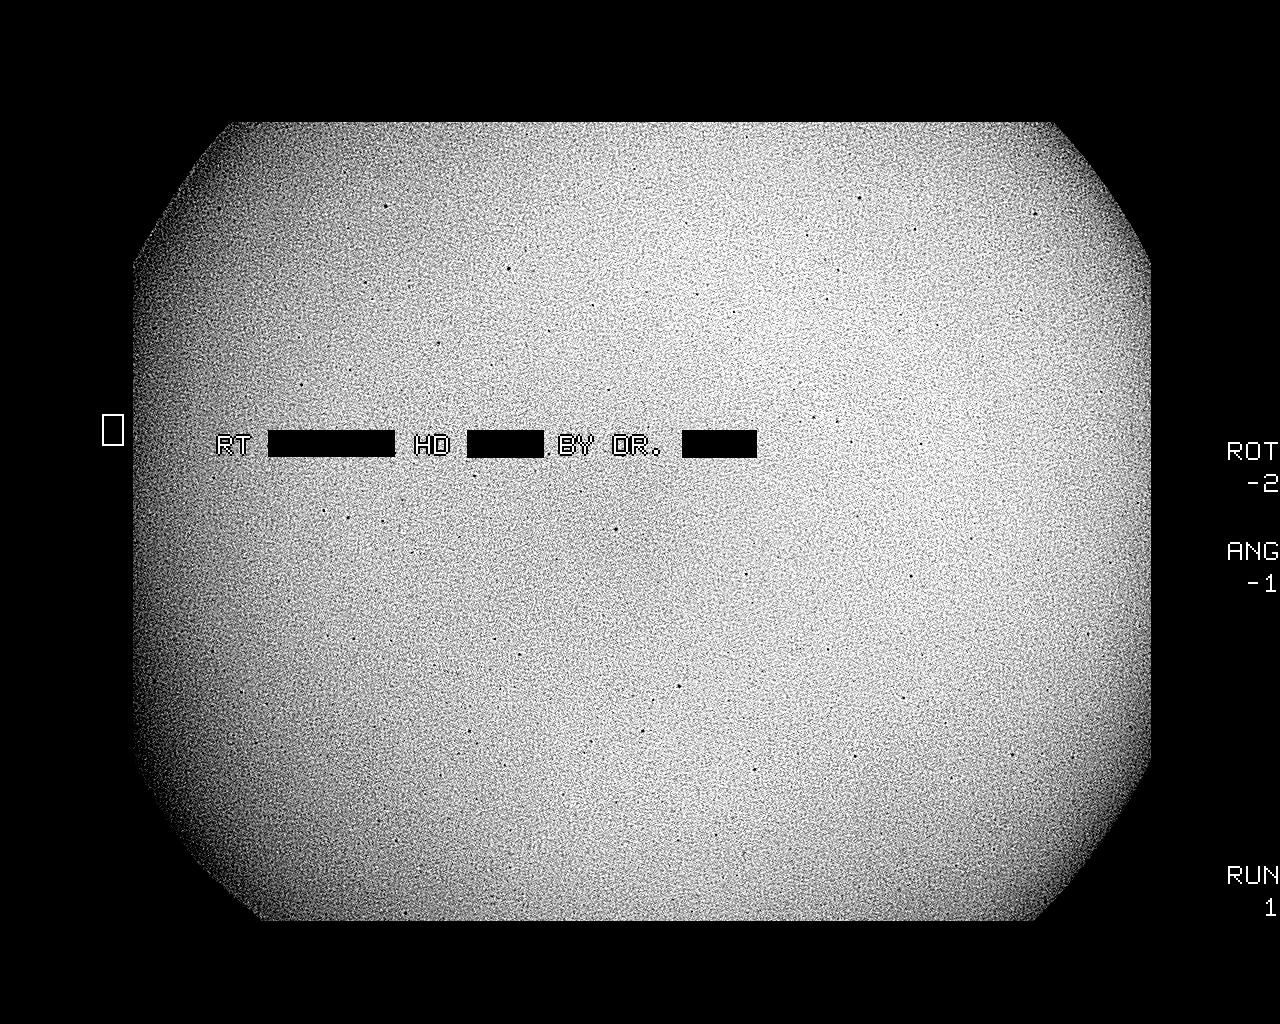

[2 of 2 positions shown; findings below may reference images not displayed]

IMPRESSION: Successful tunneled dialysis catheter removal.

## 2012-07-31 ENCOUNTER — Encounter (HOSPITAL_COMMUNITY)
Admission: RE | Admit: 2012-07-31 | Discharge: 2012-07-31 | Disposition: A | Discharge status: Home / Self Care | Payer: Medicare Other | Source: Ambulatory Visit | Attending: Nephrology | Admitting: Nephrology

## 2012-07-31 MED ORDER — EPOETIN ALFA 20000 UNIT/ML IJ SOLN: 20000.0000 [IU] | INTRAMUSCULAR | Status: DC

## 2012-08-01 ENCOUNTER — Encounter (HOSPITAL_COMMUNITY): Payer: PRIVATE HEALTH INSURANCE

## 2012-08-01 LAB — POCT HEMOGLOBIN-HEMACUE: Hemoglobin: 13.3 g/dL (ref 13.0–17.0)

## 2012-08-13 ENCOUNTER — Other Ambulatory Visit (HOSPITAL_COMMUNITY): Payer: Self-pay | Admitting: *Deleted

## 2012-08-14 ENCOUNTER — Encounter (HOSPITAL_COMMUNITY)
Admission: RE | Admit: 2012-08-14 | Discharge: 2012-08-14 | Disposition: A | Discharge status: Home / Self Care | Payer: Medicare Other | Source: Ambulatory Visit | Attending: Nephrology | Admitting: Nephrology

## 2012-08-14 DIAGNOSIS — N184 Chronic kidney disease, stage 4 (severe): Secondary | ICD-10-CM | POA: Insufficient documentation

## 2012-08-14 DIAGNOSIS — D649 Anemia, unspecified: Secondary | ICD-10-CM | POA: Insufficient documentation

## 2012-08-14 LAB — IRON AND TIBC
Iron: 108 ug/dL (ref 42–135)
Saturation Ratios: 54 % (ref 20–55)
UIBC: 93 ug/dL — ABNORMAL LOW (ref 125–400)

## 2012-08-14 LAB — POCT HEMOGLOBIN-HEMACUE: Hemoglobin: 12.6 g/dL — ABNORMAL LOW (ref 13.0–17.0)

## 2012-08-14 MED ORDER — EPOETIN ALFA 20000 UNIT/ML IJ SOLN: 20000.0000 [IU] | INTRAMUSCULAR | Status: DC

## 2012-08-27 ENCOUNTER — Other Ambulatory Visit (HOSPITAL_COMMUNITY): Payer: Self-pay

## 2012-08-28 ENCOUNTER — Inpatient Hospital Stay (HOSPITAL_COMMUNITY): Admission: RE | Admit: 2012-08-28 | Payer: PRIVATE HEALTH INSURANCE | Source: Ambulatory Visit

## 2012-09-13 ENCOUNTER — Encounter (HOSPITAL_COMMUNITY): Payer: PRIVATE HEALTH INSURANCE

## 2013-04-22 ENCOUNTER — Encounter (HOSPITAL_COMMUNITY): Payer: Self-pay | Admitting: *Deleted

## 2013-04-22 ENCOUNTER — Other Ambulatory Visit: Payer: Self-pay

## 2013-04-22 ENCOUNTER — Inpatient Hospital Stay (HOSPITAL_COMMUNITY)
Admission: EM | Admit: 2013-04-22 | Discharge: 2013-04-25 | DRG: 065 | Disposition: A | Discharge status: Rehabilitation Facility | Payer: Medicare Other | Attending: Internal Medicine | Admitting: Internal Medicine

## 2013-04-22 ENCOUNTER — Emergency Department (HOSPITAL_COMMUNITY): Payer: Medicare Other

## 2013-04-22 DIAGNOSIS — G8190 Hemiplegia, unspecified affecting unspecified side: Secondary | ICD-10-CM | POA: Diagnosis present

## 2013-04-22 DIAGNOSIS — Z79899 Other long term (current) drug therapy: Secondary | ICD-10-CM

## 2013-04-22 DIAGNOSIS — E7800 Pure hypercholesterolemia, unspecified: Secondary | ICD-10-CM | POA: Diagnosis present

## 2013-04-22 DIAGNOSIS — R4789 Other speech disturbances: Secondary | ICD-10-CM | POA: Diagnosis present

## 2013-04-22 DIAGNOSIS — H5500 Unspecified nystagmus: Secondary | ICD-10-CM | POA: Diagnosis present

## 2013-04-22 DIAGNOSIS — D649 Anemia, unspecified: Secondary | ICD-10-CM

## 2013-04-22 DIAGNOSIS — Z87891 Personal history of nicotine dependence: Secondary | ICD-10-CM

## 2013-04-22 DIAGNOSIS — E1149 Type 2 diabetes mellitus with other diabetic neurological complication: Secondary | ICD-10-CM | POA: Diagnosis present

## 2013-04-22 DIAGNOSIS — E1142 Type 2 diabetes mellitus with diabetic polyneuropathy: Secondary | ICD-10-CM | POA: Diagnosis present

## 2013-04-22 DIAGNOSIS — Z7982 Long term (current) use of aspirin: Secondary | ICD-10-CM

## 2013-04-22 DIAGNOSIS — I6350 Cerebral infarction due to unspecified occlusion or stenosis of unspecified cerebral artery: Principal | ICD-10-CM | POA: Diagnosis present

## 2013-04-22 DIAGNOSIS — R4781 Slurred speech: Secondary | ICD-10-CM

## 2013-04-22 DIAGNOSIS — R29810 Facial weakness: Secondary | ICD-10-CM | POA: Diagnosis present

## 2013-04-22 DIAGNOSIS — M109 Gout, unspecified: Secondary | ICD-10-CM | POA: Diagnosis present

## 2013-04-22 DIAGNOSIS — D61818 Other pancytopenia: Secondary | ICD-10-CM | POA: Diagnosis present

## 2013-04-22 DIAGNOSIS — R42 Dizziness and giddiness: Secondary | ICD-10-CM

## 2013-04-22 DIAGNOSIS — R471 Dysarthria and anarthria: Secondary | ICD-10-CM | POA: Diagnosis present

## 2013-04-22 DIAGNOSIS — N189 Chronic kidney disease, unspecified: Secondary | ICD-10-CM | POA: Diagnosis present

## 2013-04-22 DIAGNOSIS — H8110 Benign paroxysmal vertigo, unspecified ear: Secondary | ICD-10-CM

## 2013-04-22 DIAGNOSIS — E119 Type 2 diabetes mellitus without complications: Secondary | ICD-10-CM | POA: Diagnosis present

## 2013-04-22 DIAGNOSIS — E785 Hyperlipidemia, unspecified: Secondary | ICD-10-CM | POA: Diagnosis present

## 2013-04-22 DIAGNOSIS — I129 Hypertensive chronic kidney disease with stage 1 through stage 4 chronic kidney disease, or unspecified chronic kidney disease: Secondary | ICD-10-CM | POA: Diagnosis present

## 2013-04-22 DIAGNOSIS — I6789 Other cerebrovascular disease: Secondary | ICD-10-CM | POA: Diagnosis present

## 2013-04-22 DIAGNOSIS — I776 Arteritis, unspecified: Secondary | ICD-10-CM | POA: Diagnosis present

## 2013-04-22 DIAGNOSIS — Z794 Long term (current) use of insulin: Secondary | ICD-10-CM

## 2013-04-22 DIAGNOSIS — I639 Cerebral infarction, unspecified: Secondary | ICD-10-CM | POA: Diagnosis present

## 2013-04-22 DIAGNOSIS — I10 Essential (primary) hypertension: Secondary | ICD-10-CM

## 2013-04-22 DIAGNOSIS — N19 Unspecified kidney failure: Secondary | ICD-10-CM | POA: Diagnosis present

## 2013-04-22 LAB — URINALYSIS, ROUTINE W REFLEX MICROSCOPIC
Bilirubin Urine: NEGATIVE
Hgb urine dipstick: NEGATIVE
Ketones, ur: NEGATIVE mg/dL
Nitrite: NEGATIVE
Specific Gravity, Urine: 1.015 (ref 1.005–1.030)
Urobilinogen, UA: 0.2 mg/dL (ref 0.0–1.0)
pH: 7 (ref 5.0–8.0)

## 2013-04-22 LAB — COMPREHENSIVE METABOLIC PANEL
ALT: 11 U/L (ref 0–53)
Alkaline Phosphatase: 47 U/L (ref 39–117)
BUN: 14 mg/dL (ref 6–23)
Chloride: 100 mEq/L (ref 96–112)
GFR calc Af Amer: 63 mL/min — ABNORMAL LOW (ref >90)
Glucose, Bld: 166 mg/dL — ABNORMAL HIGH (ref 70–99)
Potassium: 3.3 mEq/L — ABNORMAL LOW (ref 3.5–5.1)
Sodium: 139 mEq/L (ref 135–145)
Total Bilirubin: 0.4 mg/dL (ref 0.3–1.2)

## 2013-04-22 LAB — URINE MICROSCOPIC-ADD ON

## 2013-04-22 LAB — APTT: aPTT: 36 seconds (ref 24–37)

## 2013-04-22 LAB — CBC WITH DIFFERENTIAL/PLATELET
Hemoglobin: 12.1 g/dL — ABNORMAL LOW (ref 13.0–17.0)
Lymphocytes Relative: 18 % (ref 12–46)
Lymphs Abs: 0.6 10*3/uL — ABNORMAL LOW (ref 0.7–4.0)
Monocytes Relative: 8 % (ref 3–12)
Neutro Abs: 2.4 10*3/uL (ref 1.7–7.7)
Neutrophils Relative %: 69 % (ref 43–77)
Platelets: 145 10*3/uL — ABNORMAL LOW (ref 150–400)
RBC: 4.19 MIL/uL — ABNORMAL LOW (ref 4.22–5.81)
WBC: 3.4 10*3/uL — ABNORMAL LOW (ref 4.0–10.5)

## 2013-04-22 LAB — TROPONIN I: Troponin I: <0.3 ng/mL (ref <0.30)

## 2013-04-22 LAB — PROTIME-INR: INR: 1.05 (ref 0.00–1.49)

## 2013-04-22 NOTE — ED Notes (Addendum)
Per EMS: pt woke up at 03:00 this morning and was having dizziness with standing. Pt states that the dizziness continued throughout the day. Pt complaining that it gets worse with standing and causes nausea. Pt CBG normal VSS. Pt does not have a hx of vertigo but does have a hx of near syncope.

## 2013-04-22 NOTE — ED Provider Notes (Signed)
History     CSN: [id number]  Arrival date & time 04/22/13  2048   First MD Initiated Contact with Patient 04/22/13 2059      Chief Complaint  Patient presents with  . Dizziness    (Consider location/radiation/quality/duration/timing/severity/associated sxs/prior treatment) HPI Pt states he woke early this AM around 3am and got out of bed. He became dizzy, descrbing sensation as room spinning and the feeling he was going to pass out. Sensation has persisted throughout the day. Worse when standing or sitting up. No focal weakness or numbness. No visual changes. Family states they have noticed his speech being slurred since he woke this AM. Pt denies fever, chills, SOb, cough, CP, N/V/D, abd pain.  Past Medical History  Diagnosis Date  . Hypertension   . Diabetes mellitus   . Hypercholesterolemia   . Gout   . Shortness of breath on exertion   . Bronchitis   . Arthritis   . Near syncope 10/2011; 11/2011  . Renal disorder   . CKD (chronic kidney disease)   . Glomerulonephritis   . Pancytopenia   . Anemia     Past Surgical History  Procedure Laterality Date  . Hydrocele excision    . Cataract extraction w/ intraocular lens implant  ~ 2009    right    Family History  Problem Relation Age of Onset  . Hypertension Mother   . Heart disease Mother   . Diabetes Sister     History  Substance Use Topics  . Smoking status: Former Smoker -- 1.00 packs/day for 40 years    Types: Cigarettes  . Smokeless tobacco: Never Used  . Alcohol Use: No     Comment: 11/23/11 "couple ounces q month maybe"      Review of Systems  Constitutional: Positive for fatigue. Negative for fever and chills.  HENT: Negative for neck pain and neck stiffness.   Eyes: Negative for visual disturbance.  Respiratory: Negative for cough, chest tightness, shortness of breath and wheezing.   Cardiovascular: Negative for chest pain, palpitations and leg swelling.  Gastrointestinal: Negative for nausea,  vomiting, abdominal pain and diarrhea.  Genitourinary: Negative for dysuria and frequency.  Musculoskeletal: Negative for myalgias and back pain.  Skin: Negative for rash and wound.  Neurological: Positive for dizziness, speech difficulty and light-headedness. Negative for tremors, syncope, weakness, numbness and headaches.  All other systems reviewed and are negative.    Allergies  Review of patient's allergies indicates no known allergies.  Home Medications   Current Outpatient Rx  Name  Route  Sig  Dispense  Refill  . amLODipine (NORVASC) 10 MG tablet   Oral   Take 10 mg by mouth daily.         [name] aspirin 81 MG EC tablet   Oral   Take 81 mg by mouth daily. Swallow whole.         . azaTHIOprine (IMURAN) 50 MG tablet   Oral   Take 100 mg by mouth daily.         . carvedilol (COREG) 12.5 MG tablet   Oral   Take 12.5 mg by mouth 2 (two) times daily with a meal.         . ciprofloxacin (CIPRO) 500 MG tablet   Oral   Take 500 mg by mouth 2 (two) times daily.         . colchicine 0.6 MG tablet   Oral   Take 0.6 mg by mouth 2 (two) times daily as  needed (for gout flareups).         . cyclophosphamide (CYTOXAN) 50 MG tablet   Oral   Take 50 mg by mouth daily. Give on an empty stomach 1 hour before or 2 hours after meals.         . diclofenac sodium (VOLTAREN) 1 % GEL   Topical   Apply 4 g topically 2 (two) times daily.          [name] dutasteride (AVODART) 0.5 MG capsule   Oral   Take 0.5 mg by mouth daily.          . furosemide (LASIX) 40 MG tablet   Oral   Take 40 mg by mouth 2 (two) times daily.          . hydrALAZINE (APRESOLINE) 100 MG tablet   Oral   Take 100 mg by mouth 2 (two) times daily.         . insulin aspart (NOVOLOG FLEXPEN) 100 UNIT/ML injection   Subcutaneous   Inject 2-10 Units into the skin 2 (two) times daily before a meal. Per SSI         . Lactobacillus (ACIDOPHILUS PROBIOTIC) 100 MG CAPS   Oral   Take 1 capsule (100  mg total) by mouth 3 (three) times daily.   90 capsule   0   . potassium chloride SA (K-DUR,KLOR-CON) 20 MEQ tablet   Oral   Take 20 mEq by mouth daily.         . rosuvastatin (CRESTOR) 10 MG tablet   Oral   Take 10 mg by mouth at bedtime.         . Tamsulosin HCl (FLOMAX) 0.4 MG CAPS   Oral   Take 0.4 mg by mouth at bedtime.         Sallye Lat (EYE DROPS ADVANCED RELIEF OP)   Both Eyes   Place 1 drop into both eyes daily.         . Vitamin D, Ergocalciferol, (DRISDOL) 50000 UNITS CAPS   Oral   Take 50,000 Units by mouth every 7 (seven) days. Take on sundays           BP 163/67  Pulse 78  Temp(Src) 98.2 F (36.8 C) (Oral)  Resp 23  SpO2 97%  Physical Exam  Nursing note and vitals reviewed. Constitutional: He is oriented to person, place, and time. He appears well-developed and well-nourished. No distress.  HENT:  Head: Normocephalic and atraumatic.  Mouth/Throat: Oropharynx is clear and moist.  Eyes: EOM are normal. Pupils are equal, round, and reactive to light.  bl pinpoint pupils  Neck: Normal range of motion. Neck supple.  Cardiovascular: Normal rate and regular rhythm.   Pulmonary/Chest: Effort normal and breath sounds normal. No respiratory distress. He has no wheezes. He has no rales.  Abdominal: Soft. Bowel sounds are normal. He exhibits no distension and no mass. There is no tenderness. There is no rebound and no guarding.  Musculoskeletal: Normal range of motion. He exhibits no edema and no tenderness.  Neurological: He is alert and oriented to person, place, and time.  Finger to nose intact, 5/5 motor in all ext, sensation intact, question slurred speech. Assymetric face without definite droop.   Skin: Skin is warm and dry. No rash noted. No erythema.  Psychiatric: He has a normal mood and affect. His behavior is normal.    ED Course  Procedures (including critical care time)  Labs Reviewed  CBC WITH DIFFERENTIAL -  Abnormal; Notable for the following:    WBC 3.4 (*)    RBC 4.19 (*)    Hemoglobin 12.1 (*)    HCT 35.5 (*)    Platelets 145 (*)    Lymphs Abs 0.6 (*)    All other components within normal limits  COMPREHENSIVE METABOLIC PANEL - Abnormal; Notable for the following:    Potassium 3.3 (*)    Glucose, Bld 166 (*)    GFR calc non Af Amer 54 (*)    GFR calc Af Amer 63 (*)    All other components within normal limits  URINALYSIS, ROUTINE W REFLEX MICROSCOPIC - Abnormal; Notable for the following:    Protein, ur 30 (*)    All other components within normal limits  URINE MICROSCOPIC-ADD ON - Abnormal; Notable for the following:    Casts HYALINE CASTS (*)    All other components within normal limits  TROPONIN I  PROTIME-INR  APTT   Ct Head Wo Contrast  04/22/2013   *RADIOLOGY REPORT*  Clinical Data: Dizziness and blurred vision.  CT HEAD WITHOUT CONTRAST  Technique:  Contiguous axial images were obtained from the base of the skull through the vertex without contrast.  Comparison: 11/21/2011  Findings: Stable cortical atrophy. The brain demonstrates no evidence of hemorrhage, infarction, edema, mass effect, extra-axial fluid collection, hydrocephalus or mass lesion.  The skull is unremarkable.  IMPRESSION: No acute findings.  Stable atrophy.   Original Report Authenticated By: Aletta Edouard, M.D.     1. Dizziness   2. Slurred speech      Date: 04/22/2013  Rate: 60  Rhythm: normal sinus rhythm  QRS Axis: normal  Intervals: QRS prolonged  ST/T Wave abnormalities: nonspecific T wave changes  Conduction Disutrbances:right bundle branch block  Narrative Interpretation:   Old EKG Reviewed: unchanged    MDM  Discussed with Dr Doy Mince of Neurology. Will admit to [facility] for further workup.         Julianne Rice, MD 04/23/13 [phone]

## 2013-04-23 ENCOUNTER — Inpatient Hospital Stay (HOSPITAL_COMMUNITY): Payer: Medicare Other

## 2013-04-23 ENCOUNTER — Encounter (HOSPITAL_COMMUNITY): Payer: Self-pay | Admitting: Internal Medicine

## 2013-04-23 DIAGNOSIS — I6350 Cerebral infarction due to unspecified occlusion or stenosis of unspecified cerebral artery: Principal | ICD-10-CM

## 2013-04-23 DIAGNOSIS — R4789 Other speech disturbances: Secondary | ICD-10-CM

## 2013-04-23 DIAGNOSIS — H8110 Benign paroxysmal vertigo, unspecified ear: Secondary | ICD-10-CM

## 2013-04-23 DIAGNOSIS — N19 Unspecified kidney failure: Secondary | ICD-10-CM

## 2013-04-23 DIAGNOSIS — E119 Type 2 diabetes mellitus without complications: Secondary | ICD-10-CM

## 2013-04-23 DIAGNOSIS — I6789 Other cerebrovascular disease: Secondary | ICD-10-CM

## 2013-04-23 DIAGNOSIS — R42 Dizziness and giddiness: Secondary | ICD-10-CM

## 2013-04-23 DIAGNOSIS — D649 Anemia, unspecified: Secondary | ICD-10-CM

## 2013-04-23 DIAGNOSIS — I639 Cerebral infarction, unspecified: Secondary | ICD-10-CM | POA: Diagnosis present

## 2013-04-23 DIAGNOSIS — I10 Essential (primary) hypertension: Secondary | ICD-10-CM

## 2013-04-23 LAB — CBC WITH DIFFERENTIAL/PLATELET
Basophils Absolute: 0 10*3/uL (ref 0.0–0.1)
Basophils Relative: 0 % (ref 0–1)
Eosinophils Absolute: 0.2 10*3/uL (ref 0.0–0.7)
Eosinophils Relative: 7 % — ABNORMAL HIGH (ref 0–5)
HCT: 32.5 % — ABNORMAL LOW (ref 39.0–52.0)
Hemoglobin: 11 g/dL — ABNORMAL LOW (ref 13.0–17.0)
Lymphocytes Relative: 22 % (ref 12–46)
Lymphs Abs: 0.7 10*3/uL (ref 0.7–4.0)
MCH: 28.9 pg (ref 26.0–34.0)
MCHC: 33.8 g/dL (ref 30.0–36.0)
MCV: 85.3 fL (ref 78.0–100.0)
Monocytes Absolute: 0.4 10*3/uL (ref 0.1–1.0)
Monocytes Relative: 12 % (ref 3–12)
Neutro Abs: 1.9 10*3/uL (ref 1.7–7.7)
Neutrophils Relative %: 58 % (ref 43–77)
Platelets: 138 10*3/uL — ABNORMAL LOW (ref 150–400)
RBC: 3.81 MIL/uL — ABNORMAL LOW (ref 4.22–5.81)
RDW: 15.6 % — ABNORMAL HIGH (ref 11.5–15.5)
WBC: 3.2 10*3/uL — ABNORMAL LOW (ref 4.0–10.5)

## 2013-04-23 LAB — COMPREHENSIVE METABOLIC PANEL
ALT: 11 U/L (ref 0–53)
AST: 18 U/L (ref 0–37)
Albumin: 3.4 g/dL — ABNORMAL LOW (ref 3.5–5.2)
Alkaline Phosphatase: 41 U/L (ref 39–117)
BUN: 14 mg/dL (ref 6–23)
CO2: 31 mEq/L (ref 19–32)
Calcium: 9 mg/dL (ref 8.4–10.5)
Chloride: 100 mEq/L (ref 96–112)
Creatinine, Ser: 1.28 mg/dL (ref 0.50–1.35)
GFR calc Af Amer: 60 mL/min — ABNORMAL LOW (ref >90)
GFR calc non Af Amer: 52 mL/min — ABNORMAL LOW (ref >90)
Glucose, Bld: 182 mg/dL — ABNORMAL HIGH (ref 70–99)
Potassium: 2.9 mEq/L — ABNORMAL LOW (ref 3.5–5.1)
Sodium: 140 mEq/L (ref 135–145)
Total Bilirubin: 0.3 mg/dL (ref 0.3–1.2)
Total Protein: 6.7 g/dL (ref 6.0–8.3)

## 2013-04-23 LAB — HEMOGLOBIN A1C
Hgb A1c MFr Bld: 6.6 % — ABNORMAL HIGH (ref <5.7)
Mean Plasma Glucose: 143 mg/dL — ABNORMAL HIGH (ref <117)

## 2013-04-23 LAB — GLUCOSE, CAPILLARY
Glucose-Capillary: 137 mg/dL — ABNORMAL HIGH (ref 70–99)
Glucose-Capillary: 128 mg/dL — ABNORMAL HIGH (ref 70–99)
Glucose-Capillary: 100 mg/dL — ABNORMAL HIGH (ref 70–99)

## 2013-04-23 LAB — TSH: TSH: 0.767 u[IU]/mL (ref 0.350–4.500)

## 2013-04-23 LAB — POTASSIUM: Potassium: 3.2 mEq/L — ABNORMAL LOW (ref 3.5–5.1)

## 2013-04-23 LAB — LIPID PANEL
HDL: 57 mg/dL (ref >39)
LDL Cholesterol: 40 mg/dL (ref 0–99)
VLDL: 20 mg/dL (ref 0–40)

## 2013-04-23 LAB — MAGNESIUM: Magnesium: 2.1 mg/dL (ref 1.5–2.5)

## 2013-04-23 MED ORDER — ASPIRIN EC 81 MG PO TBEC
81.0000 mg | DELAYED_RELEASE_TABLET | Freq: Every day | ORAL | Status: DC
  Administered 2013-04-23: 81 mg via ORAL
  Filled 2013-04-23 (×2): qty 1

## 2013-04-23 MED ORDER — INSULIN ASPART 100 UNIT/ML ~~LOC~~ SOLN
0.0000 [IU] | Freq: Three times a day (TID) | SUBCUTANEOUS | Status: DC
  Administered 2013-04-24: 1 [IU] via SUBCUTANEOUS

## 2013-04-23 MED ORDER — VITAMIN D (ERGOCALCIFEROL) 1.25 MG (50000 UNIT) PO CAPS: 50000.0000 [IU] | ORAL_CAPSULE | ORAL | Status: DC

## 2013-04-23 MED ORDER — POTASSIUM CHLORIDE CRYS ER 20 MEQ PO TBCR
20.0000 meq | EXTENDED_RELEASE_TABLET | Freq: Every day | ORAL | Status: DC
  Filled 2013-04-23: qty 1

## 2013-04-23 MED ORDER — HEPARIN SODIUM (PORCINE) 5000 UNIT/ML IJ SOLN
5000.0000 [IU] | Freq: Three times a day (TID) | INTRAMUSCULAR | Status: DC
  Administered 2013-04-23 – 2013-04-25 (×8): 5000 [IU] via SUBCUTANEOUS
  Filled 2013-04-23 (×10): qty 1

## 2013-04-23 MED ORDER — HYDRALAZINE HCL 50 MG PO TABS
100.0000 mg | ORAL_TABLET | Freq: Two times a day (BID) | ORAL | Status: DC
  Administered 2013-04-23: 100 mg via ORAL
  Filled 2013-04-23 (×3): qty 2

## 2013-04-23 MED ORDER — COLCHICINE 0.6 MG PO TABS
0.6000 mg | ORAL_TABLET | Freq: Two times a day (BID) | ORAL | Status: DC | PRN
  Filled 2013-04-23: qty 1

## 2013-04-23 MED ORDER — AMLODIPINE BESYLATE 10 MG PO TABS
10.0000 mg | ORAL_TABLET | Freq: Every day | ORAL | Status: DC
  Filled 2013-04-23: qty 1

## 2013-04-23 MED ORDER — CYCLOPHOSPHAMIDE 50 MG PO TABS
50.0000 mg | ORAL_TABLET | Freq: Every day | ORAL | Status: DC
  Administered 2013-04-24 – 2013-04-25 (×2): 50 mg via ORAL
  Filled 2013-04-23 (×4): qty 1

## 2013-04-23 MED ORDER — LORAZEPAM 2 MG/ML IJ SOLN
INTRAMUSCULAR | Status: AC
  Filled 2013-04-23: qty 1

## 2013-04-23 MED ORDER — ONDANSETRON HCL 4 MG/2ML IJ SOLN
4.0000 mg | Freq: Four times a day (QID) | INTRAMUSCULAR | Status: DC | PRN
Start: 2013-04-23 — End: 2013-04-25
  Administered 2013-04-23: 4 mg via INTRAVENOUS
  Filled 2013-04-23: qty 2

## 2013-04-23 MED ORDER — POTASSIUM CHLORIDE 10 MEQ/100ML IV SOLN
10.0000 meq | INTRAVENOUS | Status: AC
  Administered 2013-04-23 (×4): 10 meq via INTRAVENOUS
  Filled 2013-04-23 (×8): qty 100

## 2013-04-23 MED ORDER — FUROSEMIDE 40 MG PO TABS
40.0000 mg | ORAL_TABLET | Freq: Two times a day (BID) | ORAL | Status: DC
  Filled 2013-04-23 (×3): qty 1

## 2013-04-23 MED ORDER — SENNOSIDES-DOCUSATE SODIUM 8.6-50 MG PO TABS
1.0000 | ORAL_TABLET | Freq: Every evening | ORAL | Status: DC | PRN
  Filled 2013-04-23: qty 1

## 2013-04-23 MED ORDER — TAMSULOSIN HCL 0.4 MG PO CAPS
0.4000 mg | ORAL_CAPSULE | Freq: Every day | ORAL | Status: DC
  Administered 2013-04-23 – 2013-04-24 (×2): 0.4 mg via ORAL
  Filled 2013-04-23 (×4): qty 1

## 2013-04-23 MED ORDER — LACTINEX PO CHEW
1.0000 | CHEWABLE_TABLET | Freq: Three times a day (TID) | ORAL | Status: DC
  Administered 2013-04-23 – 2013-04-25 (×6): 1 via ORAL
  Filled 2013-04-23 (×13): qty 1

## 2013-04-23 MED ORDER — LORAZEPAM 2 MG/ML IJ SOLN
1.0000 mg | Freq: Once | INTRAMUSCULAR | Status: AC
  Administered 2013-04-23: 1 mg via INTRAVENOUS

## 2013-04-23 MED ORDER — ATORVASTATIN CALCIUM 20 MG PO TABS
20.0000 mg | ORAL_TABLET | Freq: Every day | ORAL | Status: DC
  Administered 2013-04-23 – 2013-04-25 (×3): 20 mg via ORAL
  Filled 2013-04-23 (×4): qty 1

## 2013-04-23 MED ORDER — GADOBENATE DIMEGLUMINE 529 MG/ML IV SOLN
20.0000 mL | Freq: Once | INTRAVENOUS | Status: AC | PRN
  Administered 2013-04-23: 20 mL via INTRAVENOUS

## 2013-04-23 MED ORDER — CLOPIDOGREL BISULFATE 75 MG PO TABS
75.0000 mg | ORAL_TABLET | Freq: Every day | ORAL | Status: DC
  Administered 2013-04-23 – 2013-04-25 (×3): 75 mg via ORAL
  Filled 2013-04-23 (×4): qty 1

## 2013-04-23 MED ORDER — AZATHIOPRINE 50 MG PO TABS
100.0000 mg | ORAL_TABLET | Freq: Every day | ORAL | Status: DC
  Administered 2013-04-23 – 2013-04-25 (×3): 100 mg via ORAL
  Filled 2013-04-23 (×3): qty 2

## 2013-04-23 MED ORDER — POTASSIUM CHLORIDE 20 MEQ/15ML (10%) PO LIQD
40.0000 meq | Freq: Every day | ORAL | Status: DC
  Administered 2013-04-23: 40 meq via ORAL
  Filled 2013-04-23 (×3): qty 30

## 2013-04-23 MED ORDER — DICLOFENAC SODIUM 1 % TD GEL
4.0000 g | Freq: Two times a day (BID) | TRANSDERMAL | Status: DC
  Administered 2013-04-23 – 2013-04-25 (×5): 4 g via TOPICAL
  Filled 2013-04-23: qty 100

## 2013-04-23 MED ORDER — SODIUM CHLORIDE 0.9 % IV SOLN
INTRAVENOUS | Status: DC
  Administered 2013-04-23: 03:00:00 via INTRAVENOUS

## 2013-04-23 MED ORDER — SODIUM CHLORIDE 0.9 % IV SOLN
INTRAVENOUS | Status: DC
  Administered 2013-04-23 – 2013-04-25 (×3): via INTRAVENOUS

## 2013-04-23 MED ORDER — CARVEDILOL 12.5 MG PO TABS
12.5000 mg | ORAL_TABLET | Freq: Two times a day (BID) | ORAL | Status: DC
  Administered 2013-04-23: 12.5 mg via ORAL
  Filled 2013-04-23 (×4): qty 1

## 2013-04-23 MED ORDER — DUTASTERIDE 0.5 MG PO CAPS
0.5000 mg | ORAL_CAPSULE | Freq: Every day | ORAL | Status: DC
  Administered 2013-04-23 – 2013-04-25 (×3): 0.5 mg via ORAL
  Filled 2013-04-23 (×3): qty 1

## 2013-04-23 NOTE — Evaluation (Signed)
Speech Language Pathology Evaluation Patient Details Name: Ryan Jimenez MRN: [id number] DOB: 24-Aug-1932 Today's Date: 04/23/2013 Time: [id number] SLP Time Calculation (min): 7 min  Problem List:  Patient Active Problem List   Diagnosis Date Noted  . CVA (cerebral infarction) 04/23/2013  . Hidradenitis suppurativa of right axilla 04/05/2012  . Abscess of axillary region 04/04/2012  . Neutropenia 04/04/2012  . Pancytopenia due to chemotherapy 04/04/2012  . Anemia 04/04/2012  . Generalized weakness 04/04/2012  . Physical deconditioning 04/04/2012  . Benign positional vertigo 11/28/2011  . Hypertension 11/23/2011  . Renal failure 11/23/2011  . Type 2 diabetes mellitus 11/23/2011  . Hypercholesterolemia    Past Medical History:  Past Medical History  Diagnosis Date  . Hypertension   . Diabetes mellitus   . Hypercholesterolemia   . Gout   . Shortness of breath on exertion   . Bronchitis   . Arthritis   . Near syncope 10/2011; 11/2011  . Renal disorder   . CKD (chronic kidney disease)   . Glomerulonephritis   . Pancytopenia   . Anemia    Past Surgical History:  Past Surgical History  Procedure Laterality Date  . Hydrocele excision    . Cataract extraction w/ intraocular lens implant  ~ 2009    right   HPI:  79 y.o. 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 Patient's symptoms were persistent through the day and later presented to the ER. Patient denies any loss of function of upper or lower extremities denies any headache or any difficulty swallowing. Patient's wife felt that he had some asymmetry of his face. Patient denies any headache chest pain shortness of breath. Patient was recently placed on Cipro for UTI. CT head is negative for anything acute and patient has been made for possible CVA. Neurologist on-call Dr.  Doy Mince has been consulted. Patient is not a candidate for TPA because symptoms were beyond the window period.   Assessment / Plan / Recommendation Clinical Impression  Speech-language-cognitive assessment very brief with pt. experiencing nausea/dizziness/lethargy.  Pt. performed at functional level during conversation, verbal problem solving, intellectual awareness.  Speech minimally dysarthric, however intelligible during session.  SLP will return to determine pt.'s higher level cognitive abilities and make appropriate recommendations.    SLP Assessment  Patient needs continued Speech Lanaguage Pathology Services    Follow Up Recommendations   (to be determined)    Frequency and Duration min 2x/week  2 weeks   Pertinent Vitals/Pain Nauseated, RN aware   SLP Goals  SLP Goals Potential to Achieve Goals: Good SLP Goal #1: Pt. will demonstrate executive functioning abilities in various activites with min verbal assist. SLP Goal #2: Pt. will utilize speech strategies during compromised listening environments with min verbal cues.  SLP Evaluation Prior Functioning  Cognitive/Linguistic Baseline: Within functional limits Lives With: Spouse Vocation: Retired   Associate Professor  Overall Cognitive Status: Difficult to assess (brief eval, pt. nauseated) Arousal/Alertness: Lethargic Orientation Level: Oriented X4 Attention: Sustained Sustained Attention: Impaired Sustained Attention Impairment: Verbal basic (lethargy) Memory:  (to be assessed further) Awareness:  (intellectual intact, will assess further) Problem Solving: Appears intact Safety/Judgment: Appears intact    Comprehension  Auditory Comprehension Overall Auditory Comprehension: Appears within functional limits for tasks assessed Conversation: Simple Visual Recognition/Discrimination Discrimination: Not tested Reading Comprehension Reading Status: Not tested    Expression Expression Primary Mode of Expression:  Verbal Verbal Expression Overall  Verbal Expression: Appears within functional limits for tasks assessed Initiation: No impairment Level of Generative/Spontaneous Verbalization: Sentence Repetition: No impairment Naming: No impairment Written Expression Written Expression: Not tested   Oral / Motor Oral Motor/Sensory Function Overall Oral Motor/Sensory Function:  (will further evaluate) Motor Speech Respiration: Within functional limits Phonation: Normal Resonance: Within functional limits Articulation: Within functional limitis Intelligibility: Intelligible Motor Planning: Witnin functional limits   GO     Orbie Pyo [employer] [phone]  04/23/2013

## 2013-04-23 NOTE — Progress Notes (Signed)
INITIAL NUTRITION ASSESSMENT  DOCUMENTATION CODES Per approved criteria  -Obesity Unspecified   INTERVENTION: Ensure Complete po BID, each supplement provides 350 kcal and 13 grams of protein, once diet advanced.   NUTRITION DIAGNOSIS: Inadequate oral intake related to decreased appetite as evidenced by pt report, 5% weight loss x 1 year.   Goal: Pt to meet >/= 90% of their estimated nutrition needs.   Monitor:  PO intake, supplement acceptance, weight trend, labs  Reason for Assessment: Pt identified as at nutrition risk on the Malnutrition Screen Tool  79 y.o. male  Admitting Dx: CVA (cerebral infarction)  ASSESSMENT: Pt admitted with dizziness, dysarthia and N/V. Stroke work up underway. Per pt his appetite has been up and down x 2 months. Pt reports weight loss starting in 2012 when he had kidney failure. Pt likes ensure and is willing to drink.  Nutrition Focused Physical Exam:  Subcutaneous Fat:  Orbital Region: WNL Upper Arm Region: WNL Thoracic and Lumbar Region: WNL  Muscle:  Temple Region: WNL Clavicle Bone Region: WNL Clavicle and Acromion Bone Region: WNL Scapular Bone Region: WNL Dorsal Hand: WNL Patellar Region: WNL Anterior Thigh Region: WNL Posterior Calf Region: WNL  Edema: not present   Height: Ht Readings from Last 1 Encounters:  04/23/13 5\' 9"  (1.753 m)    Weight: Wt Readings from Last 1 Encounters:  04/23/13 217 lb 13 oz (98.8 kg)    Ideal Body Weight: 72.7 kg  % Ideal Body Weight: 136%  Wt Readings from Last 10 Encounters:  04/23/13 217 lb 13 oz (98.8 kg)  04/07/12 228 lb 8 oz (103.647 kg)  12/15/11 254 lb 13.6 oz (115.6 kg)    Usual Body Weight: 254 lb  % Usual Body Weight: 85%  BMI:  Body mass index is 32.15 kg/(m^2). Obesity Class I   Estimated Nutritional Needs: Kcal: 2000-2200 Protein: 100-115 grams Fluid: > 2 L/day  Skin: no issues noted  Diet Order: Clear Liquid  EDUCATION NEEDS: -No education needs  identified at this time  No intake or output data in the 24 hours ending 04/23/13 1512  Last BM: PTA   Labs:   Recent Labs Lab 04/22/13 2230 04/23/13 0450  NA 139 140  K 3.3* 2.9*  CL 100 100  CO2 32 31  BUN 14 14  CREATININE 1.23 1.28  CALCIUM 9.6 9.0  GLUCOSE 166* 182*    CBG (last 3)   Recent Labs  04/23/13 0625 04/23/13 1317  GLUCAP 137* 154*   Lab Results  Component Value Date   HGBA1C 6.6* 04/23/2013   Scheduled Meds: . aspirin EC  81 mg Oral Daily  . atorvastatin  20 mg Oral q1800  . azaTHIOprine  100 mg Oral Daily  . clopidogrel  75 mg Oral Q breakfast  . cyclophosphamide  50 mg Oral QAC breakfast  . diclofenac sodium  4 g Topical BID  . dutasteride  0.5 mg Oral Daily  . heparin  5,000 Units Subcutaneous Q8H  . insulin aspart  0-9 Units Subcutaneous TID WC  . lactobacillus acidophilus & bulgar  1 tablet Oral TID  . LORazepam      . potassium chloride  10 mEq Intravenous Q1 Hr x 4  . potassium chloride  40 mEq Oral Daily  . tamsulosin  0.4 mg Oral QHS  . [START ON 04/28/2013] Vitamin D (Ergocalciferol)  50,000 Units Oral Q Sun    Continuous Infusions: . sodium chloride 20 mL/hr at 04/23/13 0313  . sodium chloride 75 mL/hr  at 04/23/13 1045    Past Medical History  Diagnosis Date  . Hypertension   . Diabetes mellitus   . Hypercholesterolemia   . Gout   . Shortness of breath on exertion   . Bronchitis   . Arthritis   . Near syncope 10/2011; 11/2011  . Renal disorder   . CKD (chronic kidney disease)   . Glomerulonephritis   . Pancytopenia   . Anemia     Past Surgical History  Procedure Laterality Date  . Hydrocele excision    . Cataract extraction w/ intraocular lens implant  ~ 2009    right    [place], [place], [place] Pager [phone] After Hours Pager

## 2013-04-23 NOTE — Progress Notes (Signed)
PT Cancellation Note  Patient Details Name: DARRIN DERVISEVIC MRN: [id number] DOB: 23-Dec-1931   Cancelled Treatment:    Reason Eval/Treat Not Completed: Other (comment) (Order to start 04/24/13 at 0000. Will return 6/11.  )Thanks.   INGOLD,Roshell Brigham 04/23/2013, 10:43 AM  [facility] [phone] (pager)

## 2013-04-23 NOTE — Consult Note (Signed)
Referring Physician: Lita Mains    Chief Complaint: Dizziness and slurred speech  HPI: Ryan Jimenez is an 79 y.o. male who reports that he went to bed normal last evening feeling at his baseline.  He awakened at about 3am to use the bathroom and was dizzy.  He remained dizzy throughout the day particularly when he sat or stood up.  His wife also noted that his speech was slurred.  Once in the ED the patient felt hat he noted some left sided weakness and decreased sensation on the left.  He has remained ambulatory.  Patient has had some accompanying nausea and vomiting today.  Reports that his appetite has been poor for the past week and he has eaten very little.    Date last known well: Date: 04/22/2013 Time last known well: Time: 00:30 tPA Given: No: Outside time window  Past Medical History  Diagnosis Date  . Hypertension   . Diabetes mellitus   . Hypercholesterolemia   . Gout   . Shortness of breath on exertion   . Bronchitis   . Arthritis   . Near syncope 10/2011; 11/2011  . Renal disorder   . CKD (chronic kidney disease)   . Glomerulonephritis   . Pancytopenia   . Anemia     Past Surgical History  Procedure Laterality Date  . Hydrocele excision    . Cataract extraction w/ intraocular lens implant  ~ 2009    right    Family History  Problem Relation Age of Onset  . Hypertension Mother   . Heart disease Mother   . Diabetes Sister     -  Brother with diabetes   -  Father died of colon cancer  Social History:  reports that he has quit smoking. His smoking use included Cigarettes. He has a 40 pack-year smoking history. He has never used smokeless tobacco. He reports that he does not drink alcohol or use illicit drugs.  Allergies: No Known Allergies  Medications: I have reviewed the patient's current medications. Prior to Admission:   Current outpatient prescriptions: amLODipine (NORVASC) 10 MG tablet, Take 10 mg by mouth daily., Disp: , Rfl: ;   aspirin 81 MG EC  tablet, Take 81 mg by mouth daily. Swallow whole., Disp: , Rfl: ;   azaTHIOprine (IMURAN) 50 MG tablet, Take 100 mg by mouth daily., Disp: , Rfl: ;   carvedilol (COREG) 12.5 MG tablet, Take 12.5 mg by mouth 2 (two) times daily with a meal., Disp: , Rfl:  ciprofloxacin (CIPRO) 500 MG tablet, Take 500 mg by mouth 2 (two) times daily., Disp: , Rfl: ;   colchicine 0.6 MG tablet, Take 0.6 mg by mouth 2 (two) times daily as needed (for gout flareups)., Disp: , Rfl: ;   cyclophosphamide (CYTOXAN) 50 MG tablet, Take 50 mg by mouth daily. Give on an empty stomach 1 hour before or 2 hours after meals., Disp: , Rfl: ;   diclofenac sodium (VOLTAREN) 1 % GEL, Apply 4 g topically 2 (two) times daily. , Disp: , Rfl:  dutasteride (AVODART) 0.5 MG capsule, Take 0.5 mg by mouth daily. , Disp: , Rfl: ;   furosemide (LASIX) 40 MG tablet, Take 40 mg by mouth 2 (two) times daily. , Disp: , Rfl: ;   hydrALAZINE (APRESOLINE) 100 MG tablet, Take 100 mg by mouth 2 (two) times daily., Disp: , Rfl: ;   insulin aspart (NOVOLOG FLEXPEN) 100 UNIT/ML injection, Inject 2-10 Units into the skin 2 (two) times daily before  a meal. Per SSI, Disp: , Rfl:  Lactobacillus (ACIDOPHILUS PROBIOTIC) 100 MG CAPS, Take 1 capsule (100 mg total) by mouth 3 (three) times daily., Disp: 90 capsule, Rfl: 0;  potassium chloride SA (K-DUR,KLOR-CON) 20 MEQ tablet, Take 20 mEq by mouth daily., Disp: , Rfl: ;   rosuvastatin (CRESTOR) 10 MG tablet, Take 10 mg by mouth at bedtime., Disp: , Rfl: ;   Tamsulosin HCl (FLOMAX) 0.4 MG CAPS, Take 0.4 mg by mouth at bedtime., Disp: , Rfl:  Tetrahydroz-Dextran-PEG-Povid (EYE DROPS ADVANCED RELIEF OP), Place 1 drop into both eyes daily., Disp: , Rfl: ;   Vitamin D, Ergocalciferol, (DRISDOL) 50000 UNITS CAPS, Take 50,000 Units by mouth every 7 (seven) days. Take on sundays, Disp: , Rfl:   ROS: History obtained from the patient  General ROS: negative for - poor appetite Psychological ROS: negative for - behavioral  disorder, hallucinations, memory difficulties, mood swings or suicidal ideation Ophthalmic ROS: negative for - blurry vision, double vision, eye pain or loss of vision ENT ROS: negative for - epistaxis, nasal discharge, oral lesions, sore throat, tinnitus  Allergy and Immunology ROS: negative for - hives or itchy/watery eyes Hematological and Lymphatic ROS: negative for - bleeding problems, bruising or swollen lymph nodes Endocrine ROS: negative for - galactorrhea, hair pattern changes, polydipsia/polyuria or temperature intolerance Respiratory ROS: negative for - cough, hemoptysis, shortness of breath or wheezing Cardiovascular ROS: negative for - chest pain, dyspnea on exertion, edema or irregular heartbeat Gastrointestinal ROS: as noted in HPI Genito-Urinary ROS: negative for - dysuria, hematuria, incontinence or urinary frequency/urgency Musculoskeletal ROS: right knee pain Neurological ROS: as noted in HPI Dermatological ROS: negative for rash and skin lesion changes  Physical Examination: Blood pressure 163/67, pulse 78, temperature 98.2 F (36.8 C), temperature source Oral, resp. rate 23, SpO2 97.00%.  Neurologic Examination: Mental Status: Alert, oriented, thought content appropriate.  Speech fluent without evidence of aphasia.  Mild dysarthria per wife.  Able to follow 3 step commands without difficulty. Cranial Nerves: II: Discs flat bilaterally; Visual fields grossly normal, pupils equal, round, reactive to light and accommodation III,IV, VI: ptosis not present, extra-ocular motions intact bilaterally with nystagmus on left lateral gaze V,VII: decrease in left NLF, facial light touch sensation normal bilaterally VIII: hearing normal bilaterally IX,X: gag reflex present XI: bilateral shoulder shrug XII: midline tongue extension Motor: Right : Upper extremity   5/5    Left:     Upper extremity   4+/5  Lower extremity   5/5     Lower extremity   5/5 Tone and bulk:normal tone  throughout; no atrophy noted Sensory: Pinprick and light touch decreased in the LUE and LLE Deep Tendon Reflexes: 2+ in the upper extremities and absent in the lower extremities Plantars: Right: upgoing   Left: downgoing Cerebellar: normal finger-to-nose and normal heel-to-shin test Gait: Unable to test CV: pulses palpable throughout     Laboratory Studies:  Basic Metabolic Panel:  Recent Labs Lab 04/22/13 2230  NA 139  K 3.3*  CL 100  CO2 32  GLUCOSE 166*  BUN 14  CREATININE 1.23  CALCIUM 9.6    Liver Function Tests:  Recent Labs Lab 04/22/13 2230  AST 21  ALT 11  ALKPHOS 47  BILITOT 0.4  PROT 7.3  ALBUMIN 3.9   No results found for this basename: LIPASE, AMYLASE,  in the last 168 hours No results found for this basename: AMMONIA,  in the last 168 hours  CBC:  Recent Labs Lab 04/22/13 2230  WBC 3.4*  NEUTROABS 2.4  HGB 12.1*  HCT 35.5*  MCV 84.7  PLT 145*    Cardiac Enzymes:  Recent Labs Lab 04/22/13 2230  TROPONINI <0.30    BNP: No components found with this basename: POCBNP,   CBG: No results found for this basename: GLUCAP,  in the last 168 hours  Microbiology: Results for orders placed during the hospital encounter of 05/24/12  URINE CULTURE     Status: None   Collection Time    05/24/12  1:22 PM      Result Value Range Status   Specimen Description URINE, CLEAN CATCH   Final   Special Requests none Immunocompromised   Final   Culture  Setup Time 05/24/2012 14:14   Final   Colony Count 40,000 COLONIES/ML   Final   Culture     Final   Value: Multiple bacterial morphotypes present, none predominant. Suggest appropriate recollection if clinically indicated.   Report Status 05/26/2012 FINAL   Final    Coagulation Studies:  Recent Labs  04/22/13 2230  LABPROT 13.6  INR 1.05    Urinalysis:  Recent Labs Lab 04/22/13 2301  COLORURINE YELLOW  LABSPEC 1.015  PHURINE 7.0  GLUCOSEU NEGATIVE  HGBUR NEGATIVE  BILIRUBINUR  NEGATIVE  KETONESUR NEGATIVE  PROTEINUR 30*  UROBILINOGEN 0.2  NITRITE NEGATIVE  LEUKOCYTESUR NEGATIVE    Lipid Panel: No results found for this basename: chol, trig, hdl, cholhdl, vldl, ldlcalc    HgbA1C:  Lab Results  Component Value Date   HGBA1C 7.4* 04/04/2012    Urine Drug Screen:   No results found for this basename: labopia, cocainscrnur, labbenz, amphetmu, thcu, labbarb    Alcohol Level: No results found for this basename: ETH,  in the last 168 hours  Other results: EKG: sinus rhythm at 60 bpm.  Imaging: Ct Head Wo Contrast  04/22/2013   *RADIOLOGY REPORT*  Clinical Data: Dizziness and blurred vision.  CT HEAD WITHOUT CONTRAST  Technique:  Contiguous axial images were obtained from the base of the skull through the vertex without contrast.  Comparison: 11/21/2011  Findings: Stable cortical atrophy. The brain demonstrates no evidence of hemorrhage, infarction, edema, mass effect, extra-axial fluid collection, hydrocephalus or mass lesion.  The skull is unremarkable.  IMPRESSION: No acute findings.  Stable atrophy.   Original Report Authenticated By: Aletta Edouard, M.D.    Assessment: 79 y.o. male presenting with complaints of dizziness and slurred speech.  On exam with left nystagmus and mild left sided numbness and weakness.  Patient with multiple vascular risk factors.  Can not rule out acute infarct.  Head CT reviewed and shows no acute changes.    Stroke Risk Factors - diabetes mellitus, hyperlipidemia and hypertension  Plan: 1. HgbA1c, fasting lipid panel 2. MRI, MRA  of the brain without contrast 3. PT consult, OT consult, Speech consult 4. Echocardiogram 5. Carotid dopplers 6. Prophylactic therapy-Antiplatelet med: Plavix - dose 75 mg daily 7. Risk factor modification 8. Telemetry monitoring 9. Frequent neuro checks 10. Orthostatic BP with heart rate   Alexis Goodell, MD [facility] [phone] 04/23/2013, 12:36 AM

## 2013-04-23 NOTE — Progress Notes (Signed)
NEURO HOSPITALIST PROGRESS NOTE   SUBJECTIVE:                                                                                                                         Continues to feel very nauseated and have difficulty with his vision. He noted that when looking forward the room is moving to the right.  OBJECTIVE:                                                                                                                           Vital signs in last 24 hours: Temp:  [97.9 F (36.6 C)-98.9 F (37.2 C)] 98.3 F (36.8 C) (06/10 0800) Pulse Rate:  [58-82] 58 (06/10 0800) Resp:  [12-23] 18 (06/10 0800) BP: (123-163)/(53-98) 136/55 mmHg (06/10 0800) SpO2:  [97 %-100 %] 99 % (06/10 0800) Weight:  [98.8 kg (217 lb 13 oz)] 98.8 kg (217 lb 13 oz) (06/10 0216)  Intake/Output from previous day:   Intake/Output this shift:   Nutritional status: Clear Liquid  Past Medical History  Diagnosis Date  . Hypertension   . Diabetes mellitus   . Hypercholesterolemia   . Gout   . Shortness of breath on exertion   . Bronchitis   . Arthritis   . Near syncope 10/2011; 11/2011  . Renal disorder   . CKD (chronic kidney disease)   . Glomerulonephritis   . Pancytopenia   . Anemia     Neurologic Exam:   Mental Status: Alert, oriented, thought content appropriate.  Speech fluent without evidence of aphasia.  Able to follow 3 step commands without difficulty. Cranial Nerves: II: Visual fields grossly normal, pupils equal, round, reactive to light and accommodation III,IV, VI: ptosis not present, extra-ocular motions intact bilaterally--positive nystagmus horizontal with the fast phase to the left.  Nystagmus worsens when looking to the right and decreases when looking to the left.  He has some difficulty getting eyes all the way to the left far gaze.  V,VII: smile symmetric, facial light touch sensation normal bilaterally VIII: hearing normal  bilaterally IX,X: gag reflex present XI: bilateral shoulder shrug XII: midline tongue extension Motor: Right : Upper extremity   5/5    Left:     Upper  extremity   4/5  Lower extremity   5/5     Lower extremity   5/5 Tone and bulk:normal tone throughout; no atrophy noted Sensory: Pinprick and light touch intact throughout, bilaterally Deep Tendon Reflexes: 2+ UE and none in LE symmetric throughout Plantars: Right: upgoing   Left: downgoing Cerebellar: normal finger-to-nose,  normal heel-to-shin test CV: pulses palpable throughout    Lab Results: Lab Results  Component Value Date/Time   CHOL 117 04/23/2013  5:00 AM   Lipid Panel  Recent Labs  04/23/13 0500  CHOL 117  TRIG 101  HDL 57  CHOLHDL 2.1  VLDL 20  LDLCALC 40    Studies/Results: Ct Head Wo Contrast  04/22/2013   *RADIOLOGY REPORT*  Clinical Data: Dizziness and blurred vision.  CT HEAD WITHOUT CONTRAST  Technique:  Contiguous axial images were obtained from the base of the skull through the vertex without contrast.  Comparison: 11/21/2011  Findings: Stable cortical atrophy. The brain demonstrates no evidence of hemorrhage, infarction, edema, mass effect, extra-axial fluid collection, hydrocephalus or mass lesion.  The skull is unremarkable.  IMPRESSION: No acute findings.  Stable atrophy.   Original Report Authenticated By: Aletta Edouard, M.D.    MEDICATIONS                                                                                                                        Scheduled: . aspirin EC  81 mg Oral Daily  . atorvastatin  20 mg Oral q1800  . azaTHIOprine  100 mg Oral Daily  . clopidogrel  75 mg Oral Q breakfast  . cyclophosphamide  50 mg Oral QAC breakfast  . diclofenac sodium  4 g Topical BID  . dutasteride  0.5 mg Oral Daily  . heparin  5,000 Units Subcutaneous Q8H  . insulin aspart  0-9 Units Subcutaneous TID WC  . lactobacillus acidophilus & bulgar  1 tablet Oral TID  . potassium chloride  10  mEq Intravenous Q1 Hr x 4  . potassium chloride  40 mEq Oral Daily  . tamsulosin  0.4 mg Oral QHS  . [START ON 04/28/2013] Vitamin D (Ergocalciferol)  50,000 Units Oral Q Sun    ASSESSMENT/PLAN:                                                                                                            79 yo male with onset of Dizziness, dysarthria and N/V now presenting with significant left nystagmus to the left at rest but no longer complaining of left sided numbness  or weakness.   Recommend: 1. HgbA1c,   2. MRI, MRA of the brain without contrast and MRA neck W/WO contrast 3. PT consult, OT consult, Speech consult  4. Echocardiogram   6. Prophylactic therapy-Antiplatelet med: Plavix - dose 75 mg daily  7. Risk factor modification  8. Telemetry monitoring  9. Frequent neuro checks      Assessment and plan discussed with with attending physician and they are in agreement.    Etta Quill PA-C [facility] [phone]  04/23/2013, 10:12 AM   Patient seen and examined together with physician assistant and I concur with the assessment and plan.  Dorian Pod, MD

## 2013-04-23 NOTE — Progress Notes (Signed)
*  PRELIMINARY RESULTS* Echocardiogram 2D Echocardiogram has been performed.  Ryan Jimenez 04/23/2013, 4:03 PM

## 2013-04-23 NOTE — Progress Notes (Addendum)
Patient admitted this morning by Dr Hal Hope, seen and examined, has a history of vasculitis, diabetes, hyperlipidemia admitted for severe workup. Feeling very nauseous.  On examination, Patient has prominent nystagmus, mild facial drooping, mild dysarthria per his wife  LUE 4/5, LLE 5/5 Right upper and lower extremity 5/5  - Continue for stroke workup, MRI, MRA brain - 2-D echo, carotid Dopplers - Will add Zofran for nausea, was unable to eat due to nausea - Continue IV fluids, PT, OT, speech/swallow evaluation - DC all antihypertensives, placed on IV fluids  - currently on ASA and plavix, await stroke team recommendations   Ashwin Tibbs M.D. [facility] 04/23/2013, 10:03 AM  Pager: [phone]

## 2013-04-23 NOTE — H&P (Signed)
[facility] History and Physical  Ryan Jimenez [id number] DOB: 16-Feb-1932 DOA: 04/22/2013  Referring physician: ER physician. PCP: Philis Fendt, MD  Specialists: Dr. Graylon Gunning. Nephrologist.  Chief Complaint: Dizziness and slurred speech.  HPI: Ryan Jimenez is a 79 y.o. 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 Patient's symptoms were persistent through the day and later presented to the ER. Patient denies any loss of function of upper or lower extremities denies any headache or any difficulty swallowing. Patient's wife felt that he had some asymmetry of his face. Patient denies any headache chest pain shortness of breath. Patient was recently placed on Cipro for UTI. CT head is negative for anything acute and patient has been made for possible CVA. Neurologist on-call Dr. Doy Mince has been consulted. Patient is not a candidate for TPA because symptoms were beyond the window period.  Review of Systems: As presented in the history of presenting illness, rest negative.  Past Medical History  Diagnosis Date  . Hypertension   . Diabetes mellitus   . Hypercholesterolemia   . Gout   . Shortness of breath on exertion   . Bronchitis   . Arthritis   . Near syncope 10/2011; 11/2011  . Renal disorder   . CKD (chronic kidney disease)   . Glomerulonephritis   . Pancytopenia   . Anemia    Past Surgical History  Procedure Laterality Date  . Hydrocele excision    . Cataract extraction w/ intraocular lens implant  ~ 2009    right   Social History:  reports that he has quit smoking. His smoking use included Cigarettes. He has a 40 pack-year smoking history. He has never used smokeless tobacco. He reports that he does not drink alcohol or use illicit drugs. Lives at home. where does patient live-- Can do ADLs. Can patient  participate in ADLs?  No Known Allergies  Family History  Problem Relation Age of Onset  . Hypertension Mother   . Heart disease Mother   . Diabetes Sister       Prior to Admission medications   Medication Sig Start Date End Date Taking? Authorizing Provider  amLODipine (NORVASC) 10 MG tablet Take 10 mg by mouth daily.   Yes Historical Provider, MD  aspirin 81 MG EC tablet Take 81 mg by mouth daily. Swallow whole.   Yes Historical Provider, MD  azaTHIOprine (IMURAN) 50 MG tablet Take 100 mg by mouth daily.   Yes Historical Provider, MD  carvedilol (COREG) 12.5 MG tablet Take 12.5 mg by mouth 2 (two) times daily with a meal.   Yes Historical Provider, MD  ciprofloxacin (CIPRO) 500 MG tablet Take 500 mg by mouth 2 (two) times daily.   Yes Historical Provider, MD  colchicine 0.6 MG tablet Take 0.6 mg by mouth 2 (two) times daily as needed (for gout flareups).   Yes Historical Provider, MD  cyclophosphamide (CYTOXAN) 50 MG tablet Take 50 mg by mouth daily. Give on an empty stomach 1 hour before or 2 hours after meals.   Yes Historical Provider, MD  diclofenac sodium (VOLTAREN) 1 % GEL Apply 4 g topically 2 (two) times daily.    Yes Historical Provider, MD  dutasteride (AVODART) 0.5 MG capsule Take 0.5 mg by mouth daily.    Yes Historical Provider, MD  furosemide (LASIX) 40 MG tablet Take 40 mg by mouth 2 (two) times  daily.    Yes Historical Provider, MD  hydrALAZINE (APRESOLINE) 100 MG tablet Take 100 mg by mouth 2 (two) times daily.   Yes Historical Provider, MD  insulin aspart (NOVOLOG FLEXPEN) 100 UNIT/ML injection Inject 2-10 Units into the skin 2 (two) times daily before a meal. Per SSI   Yes Historical Provider, MD  Lactobacillus (ACIDOPHILUS PROBIOTIC) 100 MG CAPS Take 1 capsule (100 mg total) by mouth 3 (three) times daily. 04/08/12  Yes Clanford Marisa Hua, MD  potassium chloride SA (K-DUR,KLOR-CON) 20 MEQ tablet Take 20 mEq by mouth daily.   Yes Historical Provider, MD  rosuvastatin  (CRESTOR) 10 MG tablet Take 10 mg by mouth at bedtime.   Yes Historical Provider, MD  Tamsulosin HCl (FLOMAX) 0.4 MG CAPS Take 0.4 mg by mouth at bedtime.   Yes Historical Provider, MD  Tetrahydroz-Dextran-PEG-Povid (EYE DROPS ADVANCED RELIEF OP) Place 1 drop into both eyes daily.   Yes Historical Provider, MD  Vitamin D, Ergocalciferol, (DRISDOL) 50000 UNITS CAPS Take 50,000 Units by mouth every 7 (seven) days. Take on sundays   Yes Historical Provider, MD   Physical Exam: Filed Vitals:   04/23/13 0030 04/23/13 0045 04/23/13 0100 04/23/13 0106  BP: 134/61 132/66 137/98 137/98  Pulse: 63 62 58 65  Temp:      TempSrc:      Resp: 16 20 20 20   SpO2: 100% 99% 99% 99%     General:  Well-developed well-nourished.  Eyes: Anicteric no pallor.  ENT: No discharge from ears eyes nose mouth.  Neck: No mass felt.  Cardiovascular: S1-S2 heard.  Respiratory: No rhonchi or crepitations.  Abdomen: Soft nontender bowel sounds present.  Skin: No rash.  Musculoskeletal: No edema.  Psychiatric: Appears normal.  Neurologic: Alert awake oriented to time place and person. Moves all extremities 5 x 5. No facial asymmetry. Tongue is midline. PERRLA positive.  Labs on Admission:  Basic Metabolic Panel:  Recent Labs Lab 04/22/13 2230  NA 139  K 3.3*  CL 100  CO2 32  GLUCOSE 166*  BUN 14  CREATININE 1.23  CALCIUM 9.6   Liver Function Tests:  Recent Labs Lab 04/22/13 2230  AST 21  ALT 11  ALKPHOS 47  BILITOT 0.4  PROT 7.3  ALBUMIN 3.9   No results found for this basename: LIPASE, AMYLASE,  in the last 168 hours No results found for this basename: AMMONIA,  in the last 168 hours CBC:  Recent Labs Lab 04/22/13 2230  WBC 3.4*  NEUTROABS 2.4  HGB 12.1*  HCT 35.5*  MCV 84.7  PLT 145*   Cardiac Enzymes:  Recent Labs Lab 04/22/13 2230  TROPONINI <0.30    BNP (last 3 results) No results found for this basename: PROBNP,  in the last 8760 hours CBG: No results found  for this basename: GLUCAP,  in the last 168 hours  Radiological Exams on Admission: Ct Head Wo Contrast  04/22/2013   *RADIOLOGY REPORT*  Clinical Data: Dizziness and blurred vision.  CT HEAD WITHOUT CONTRAST  Technique:  Contiguous axial images were obtained from the base of the skull through the vertex without contrast.  Comparison: 11/21/2011  Findings: Stable cortical atrophy. The brain demonstrates no evidence of hemorrhage, infarction, edema, mass effect, extra-axial fluid collection, hydrocephalus or mass lesion.  The skull is unremarkable.  IMPRESSION: No acute findings.  Stable atrophy.   Original Report Authenticated By: Aletta Edouard, M.D.    EKG: Independently reviewed. Normal sinus rhythm. RBBB.  Assessment/Plan Principal Problem:  CVA (cerebral infarction) Active Problems:   Hypercholesterolemia   Hypertension   Renal failure   Type 2 diabetes mellitus   1. Possible CVA - patient has been placed on neurochecks and swallow evaluation. MRI/MRA brain carotid Dopplers and 2-D echo has been ordered. Further recommendations per neurologist. Discontinue ciprofloxacin. 2. C-ANCA positive vasculitis/chronic kidney disease - continue immunosuppressants. 3. Diabetes mellitus type 2 - closely follow CBGs. Patient has been placed on sliding-scale coverage. 4. Hypertension - continue home medications. 5. Hyperlipidemia - continue statins. 6. History of gout - continue present medications. 7. History of Hiradinitis suppuritiva.    Code Status: Full code.  Family Communication: Patient's wife and patient's brother at the bedside.  Disposition Plan: Admit to inpatient.    KAKRAKANDY,ARSHAD N. [facility] Pager [phone].   If 7PM-7AM, please contact night-coverage [email or web address] Password [email or web address] 04/23/2013, 2:03 AM

## 2013-04-24 DIAGNOSIS — I6330 Cerebral infarction due to thrombosis of unspecified cerebral artery: Secondary | ICD-10-CM

## 2013-04-24 LAB — GLUCOSE, CAPILLARY
Glucose-Capillary: 123 mg/dL — ABNORMAL HIGH (ref 70–99)
Glucose-Capillary: 102 mg/dL — ABNORMAL HIGH (ref 70–99)

## 2013-04-24 LAB — BASIC METABOLIC PANEL
GFR calc non Af Amer: 56 mL/min — ABNORMAL LOW (ref >90)
Glucose, Bld: 113 mg/dL — ABNORMAL HIGH (ref 70–99)
Potassium: 3.9 mEq/L (ref 3.5–5.1)
Sodium: 140 mEq/L (ref 135–145)

## 2013-04-24 MED ORDER — POTASSIUM CHLORIDE CRYS ER 20 MEQ PO TBCR
40.0000 meq | EXTENDED_RELEASE_TABLET | Freq: Two times a day (BID) | ORAL | Status: DC
  Administered 2013-04-24 – 2013-04-25 (×3): 40 meq via ORAL
  Filled 2013-04-24 (×4): qty 2

## 2013-04-24 NOTE — Progress Notes (Signed)
Speech Language Pathology Treatment Patient Details Name: Ryan Jimenez MRN: [id number] DOB: 1932-09-20 Today's Date: 04/24/2013 Time: [id number] SLP Time Calculation (min): 19 min  Assessment / Plan / Recommendation Clinical Impression  Pt. seen for continued diagnostic assessment.  Pt. less dizzy/nausated today and reports feeling better.  Speech sounds mildly distorted to this SLP, however pt. and wife report it is at baseline.  He did not require cues while reading paragraphs.  He wrote biographical info as well as several sentences without intervention.  Pt. reported his memory has been decreasing and He described compensatory technique he utilizes to recall important information.  Pt. is repsonsible for money management at home.  It appears that his visual/vestibular deficits are primary.  SLP will see for one more session for higher level/executive functioning abilities.       SLP Plan  Continue with current plan of care    Pertinent Vitals/Pain none  SLP Goals  SLP Goals Potential to Achieve Goals: Good SLP Goal #1: Pt. will demonstrate executive functioning abilities in various activites with min verbal assist. SLP Goal #1 - Progress: Progressing toward goal SLP Goal #2: Pt. will utilize speech strategies during compromised listening environments with min verbal cues. SLP Goal #2 - Progress: Progressing toward goal  General Temperature Spikes Noted: No Respiratory Status: Room air Behavior/Cognition: Alert;Pleasant mood;Cooperative Oral Cavity - Dentition: Adequate natural dentition Patient Positioning: Upright in bed  Oral Cavity - Oral Hygiene Does patient have any of the following "at risk" factors?: None of the above Brush patient's teeth BID with toothbrush (using toothpaste with fluoride): Yes   Treatment Treatment focused on: Cognition Skilled Treatment:  (see impression statement)   GO     Cranford Mon[employer] [phone]  04/24/2013

## 2013-04-24 NOTE — Progress Notes (Signed)
Physical Therapy Evaluation Patient Details Name: Ryan Jimenez MRN: [id number] DOB: 1932/07/08 Today's Date: 04/24/2013 Time: [id number] PT Time Calculation (min): 38 min  PT Assessment / Plan / Recommendation Clinical Impression  Pt s/p vertigo with decr mobility secondary to questionable vestibular neuritis.  Will benefit from PT to address endurance and balance as well as vestibular problems.  May need a NH stay if symptoms persisit.      PT Assessment  Patient needs continued PT services    Follow Up Recommendations  SNF;Supervision/Assistance - 24 hour                Equipment Recommendations  None recommended by PT         Frequency Min 3X/week    Precautions / Restrictions Precautions Precautions: Fall Restrictions Weight Bearing Restrictions: No   Pertinent Vitals/Pain VSS, no pain      Mobility  Bed Mobility Bed Mobility: Rolling Right;Right Sidelying to Sit Rolling Right: 4: Min assist Right Sidelying to Sit: 4: Min assist;With rails Details for Bed Mobility Assistance: used momentum to get to EOB.  Went ahead and performed rleft BBQ roll  as just in case pt had horizontal canal BPPV but suspect he is just acute neuritis at this point.   Transfers Transfers: Sit to Stand;Stand to Sit Sit to Stand: 3: Mod assist;With upper extremity assist;From bed Stand to Sit: 3: Mod assist;To bed;With armrests Details for Transfer Assistance: Pt needed cues for hand placement as well as for steadying assist upon standing.   Ambulation/Gait Ambulation/Gait Assistance: 4: Min assist Ambulation Distance (Feet): 4 Feet Assistive device: 1 person hand held assist Ambulation/Gait Assistance Details: Pt unable to ambulate over 4 feet as he was so dizzy.  Tried to have pt use compensation techniques to use target "A" however pt had difficulty questionable secondary to spontaneouse nystagmus that beat left.   Gait Pattern: Wide base of support;Ataxic Stairs: No Wheelchair  Mobility Wheelchair Mobility: No         PT Diagnosis: Generalized weakness (dizziness)  PT Problem List: Decreased activity tolerance;Decreased balance;Decreased mobility;Decreased knowledge of use of DME;Decreased safety awareness (dizziness) PT Treatment Interventions: Gait training;DME instruction;Stair training;Functional mobility training;Balance training;Therapeutic exercise;Therapeutic activities;Neuromuscular re-education;Patient/family education (vestibular therapy)   PT Goals Acute Rehab PT Goals PT Goal Formulation: With patient Time For Goal Achievement: 05/08/13 Potential to Achieve Goals: Good Pt will go Supine/Side to Sit: Independently PT Goal: Supine/Side to Sit - Progress: Goal set today Pt will go Sit to Stand: Independently PT Goal: Sit to Stand - Progress: Goal set today Pt will Ambulate: >150 feet;with supervision;with least restrictive assistive device PT Goal: Ambulate - Progress: Goal set today Pt will Perform Home Exercise Program: Independently PT Goal: Perform Home Exercise Program - Progress: Goal set today Additional Goals Additional Goal #1: Pt to have negative vestibular testing. PT Goal: Additional Goal #1 - Progress: Goal set today  Visit Information  Last PT Received On: 04/24/13 Assistance Needed: +2 (for ambulation)    Subjective Data  Subjective: "I feel so dizzy," pt stated upon standing.   Patient Stated Goal: to go home   Prior [place] Lives With: Spouse Available Help at Discharge: Available 24 hours/day Type of Home: House Home Access: Stairs to enter [employer] of Steps: 2 Entrance Stairs-Rails: None Home Layout: One level Bathroom Shower/Tub: Chiropodist: Standard Home Adaptive Equipment: Straight cane;Walker - rolling;Bedside commode/3-in-1;Tub transfer bench Prior Function Level of Independence: Independent with assistive device(s) (used cane) Able to  Take Stairs?:  Yes Driving: Yes Vocation: Retired Corporate investment banker: No difficulties    Solicitor Arousal/Alertness: Awake/alert Behavior During Therapy: WFL for tasks assessed/performed Overall Cognitive Status: Within Functional Limits for tasks assessed    Extremity/Trunk Assessment Right Lower Extremity Assessment RLE ROM/Strength/Tone: WFL for tasks assessed Left Lower Extremity Assessment LLE ROM/Strength/Tone: WFL for tasks assessed Trunk Assessment Trunk Assessment: Normal   Balance Static Standing Balance Static Standing - Balance Support: Left upper extremity supported;During functional activity Static Standing - Level of Assistance: 4: Min assist Static Standing - Comment/# of Minutes: 3 minutes needed steadying assist as pt dizzy.  End of Session PT - End of Session Equipment Utilized During Treatment: Gait belt Activity Tolerance: Other (comment) (limited by dizziness) Patient left: in bed;with call bell/phone within reach;with family/visitor present Nurse Communication: Mobility status  G    INGOLD,Denali Becvar 04/24/2013, 1:30 PM [facility] Acute Rehabilitation [phone] (pager)

## 2013-04-24 NOTE — Consult Note (Signed)
Physical Medicine and Rehabilitation Consult Reason for Consult: Question CVA Referring Physician: [facility]   HPI: Ryan Jimenez is a 79 y.o. 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 Admitted 04/23/2013 with dizziness and slurred speech. MRI of the brain negative for acute infarct. 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 MRA of the neck with 54% diameter stenosis proximal right ICA. Echocardiogram with ejection fraction [id number] grade 1 diastolic dysfunction. Patient did not receive TPA. Neurology services consulted workup ongoing presently on Plavix therapy for CVA prophylaxis as well as subcutaneous heparin for DVT prophylaxis. Await formal physical and occupational therapy evaluations to be completed. M.D. is requested physical medicine rehabilitation consult to consider inpatient rehabilitation services.   Review of Systems  Eyes: Positive for blurred vision.  Respiratory: Positive for shortness of breath.   Musculoskeletal: Positive for myalgias.  Neurological: Positive for dizziness and speech change.  All other systems reviewed and are negative.   Past Medical History  Diagnosis Date  . Hypertension   . Diabetes mellitus   . Hypercholesterolemia   . Gout   . Shortness of breath on exertion   . Bronchitis   . Arthritis   . Near syncope 10/2011; 11/2011  . Renal disorder   . CKD (chronic kidney disease)   . Glomerulonephritis   . Pancytopenia   . Anemia    Past Surgical History  Procedure Laterality Date  . Hydrocele excision    . Cataract extraction w/ intraocular lens implant  ~ 2009    right   Family History  Problem Relation Age of Onset  . Hypertension Mother   . Heart disease Mother   . Diabetes Sister     Social History:  reports that he has quit smoking. His smoking use included Cigarettes. He has a 40 pack-year smoking history. He has never used smokeless tobacco. He reports that he does not drink alcohol or use illicit drugs. Allergies: No Known Allergies Medications Prior to Admission  Medication Sig Dispense Refill  . amLODipine (NORVASC) 10 MG tablet Take 10 mg by mouth daily.      [name] aspirin 81 MG EC tablet Take 81 mg by mouth daily. Swallow whole.      . azaTHIOprine (IMURAN) 50 MG tablet Take 100 mg by mouth daily.      . carvedilol (COREG) 12.5 MG tablet Take 12.5 mg by mouth 2 (two) times daily with a meal.      . ciprofloxacin (CIPRO) 500 MG tablet Take 500 mg by mouth 2 (two) times daily.      . colchicine 0.6 MG tablet Take 0.6 mg by mouth 2 (two) times daily as needed (for gout flareups).      . cyclophosphamide (CYTOXAN) 50 MG tablet Take 50 mg by mouth daily. Give on an empty stomach 1 hour before or 2 hours after meals.      . diclofenac sodium (VOLTAREN) 1 % GEL Apply 4 g topically 2 (two) times daily.       [name] dutasteride (AVODART) 0.5 MG capsule Take 0.5 mg by mouth daily.       . furosemide (LASIX) 40 MG tablet Take 40 mg by mouth 2 (two) times daily.       . hydrALAZINE (APRESOLINE) 100 MG tablet Take 100 mg by mouth 2 (two)  times daily.      . insulin aspart (NOVOLOG FLEXPEN) 100 UNIT/ML injection Inject 2-10 Units into the skin 2 (two) times daily before a meal. Per SSI      . Lactobacillus (ACIDOPHILUS PROBIOTIC) 100 MG CAPS Take 1 capsule (100 mg total) by mouth 3 (three) times daily.  90 capsule  0  . potassium chloride SA (K-DUR,KLOR-CON) 20 MEQ tablet Take 20 mEq by mouth daily.      . rosuvastatin (CRESTOR) 10 MG tablet Take 10 mg by mouth at bedtime.      . Tamsulosin HCl (FLOMAX) 0.4 MG CAPS Take 0.4 mg by mouth at bedtime.      Sallye Lat (EYE DROPS ADVANCED RELIEF OP) Place 1 drop into both eyes daily.      . Vitamin D, Ergocalciferol,  (DRISDOL) 50000 UNITS CAPS Take 50,000 Units by mouth every 7 (seven) days. Take on sundays        Home: Home Living Lives With: Spouse Available Help at Discharge: Available 24 hours/day Type of Home: House Home Access: Stairs to enter [employer] of Steps: 2 Entrance Stairs-Rails: None Home Layout: One level Bathroom Shower/Tub: Chiropodist: Standard Home Adaptive Equipment: Straight cane;Walker - rolling;Bedside commode/3-in-1;Tub transfer bench  Functional History: Prior Function Able to Take Stairs?: Yes Driving: Yes Vocation: Retired Functional Status:  Mobility:          ADL:    Cognition: Cognition Overall Cognitive Status: Difficult to assess (brief eval, pt. nauseated) Arousal/Alertness: Lethargic Orientation Level: Oriented X4 Attention: Sustained Sustained Attention: Impaired Sustained Attention Impairment: Verbal basic (lethargy) Memory:  (to be assessed further) Awareness:  (intellectual intact, will assess further) Problem Solving: Appears intact Safety/Judgment: Appears intact Cognition Arousal/Alertness: Lethargic Overall Cognitive Status: Difficult to assess (brief eval, pt. nauseated)  Blood pressure 138/72, pulse 69, temperature 98.4 F (36.9 C), temperature source Oral, resp. rate 18, height 5\' 9"  (1.753 m), weight 98.8 kg (217 lb 13 oz), SpO2 100.00%. Physical Exam  Vitals reviewed. Constitutional: He is oriented to person, place, and time.  HENT:  Head: Normocephalic.  Eyes:  Pupils round and reactive to light  Neck: Normal range of motion. Neck supple. No thyromegaly present.  Cardiovascular: Normal rate and regular rhythm.   Pulmonary/Chest: Effort normal and breath sounds normal. No respiratory distress.  Abdominal: Soft. Bowel sounds are normal. He exhibits no distension.  Musculoskeletal: He exhibits no edema.  Neurological: He is alert and oriented to person, place, and time.  Patient follows  three-step commands. His speech is dysarthric but fully intelligible. (baseline per wife). Pt was guarded with scanning to different fields. When he did track and with sudden movements, no nystagmus was provoked.  Skin: Skin is warm and dry.  Psychiatric: He has a normal mood and affect. His behavior is normal. Judgment and thought content normal.    Results for orders placed during the hospital encounter of 04/22/13 (from the past 24 hour(s))  GLUCOSE, CAPILLARY     Status: Abnormal   Collection Time    04/23/13  1:17 PM      Result Value Range   Glucose-Capillary 154 (*) 70 - 99 mg/dL  POTASSIUM     Status: Abnormal   Collection Time    04/23/13  1:50 PM      Result Value Range   Potassium 3.2 (*) 3.5 - 5.1 mEq/L  MAGNESIUM     Status: None   Collection Time    04/23/13  1:51 PM  Result Value Range   Magnesium 2.1  1.5 - 2.5 mg/dL  GLUCOSE, CAPILLARY     Status: Abnormal   Collection Time    04/23/13  4:37 PM      Result Value Range   Glucose-Capillary 128 (*) 70 - 99 mg/dL  GLUCOSE, CAPILLARY     Status: Abnormal   Collection Time    04/23/13 10:12 PM      Result Value Range   Glucose-Capillary 100 (*) 70 - 99 mg/dL  GLUCOSE, CAPILLARY     Status: Abnormal   Collection Time    04/24/13  7:33 AM      Result Value Range   Glucose-Capillary 119 (*) 70 - 99 mg/dL   Dg Chest 2 View  04/23/2013   *RADIOLOGY REPORT*  Clinical Data: Stroke.  CHEST - 2 VIEW  Comparison: 04/04/2012  Findings: Low lung volumes are seen however both lungs are clear. Heart size within normal limits. No evidence of pleural effusion. No mass or lymphadenopathy identified.  IMPRESSION: No active disease.   Original Report Authenticated By: Earle Gell, M.D.   Ct Head Wo Contrast  04/22/2013   *RADIOLOGY REPORT*  Clinical Data: Dizziness and blurred vision.  CT HEAD WITHOUT CONTRAST  Technique:  Contiguous axial images were obtained from the base of the skull through the vertex without contrast.   Comparison: 11/21/2011  Findings: Stable cortical atrophy. The brain demonstrates no evidence of hemorrhage, infarction, edema, mass effect, extra-axial fluid collection, hydrocephalus or mass lesion.  The skull is unremarkable.  IMPRESSION: No acute findings.  Stable atrophy.   Original Report Authenticated By: Aletta Edouard, M.D.   Mr Angiogram Neck W Wo Contrast  04/23/2013   *RADIOLOGY REPORT*  Clinical Data:  Diabetic hypertensive patient with hypercholesterolemia presenting with slurred speech and blurred vision with nausea and vomiting.  MRI BRAIN WITH AND WITHOUT CONTRAST MRA HEAD WITHOUT CONTRAST MRA NECK WITHOUT AND WITH CONTRAST  Technique: Multiplanar, multiecho pulse sequences of the brain and surrounding structures were obtained according to standard protocol with and without intravenous contrast.  Angiographic images of the Circle of Willis were obtained using MRA technique without intravenous contrast.  Angiographic images of the neck were obtained using MRA technique without and with intravenous contrast.  Contrast:20 ml MultiHance.  Comparison:04/22/2013 CT.  11/23/2011 MR.  MRI HEAD  Findings: No acute infarct.  No intracranial hemorrhage.  Moderate small vessel disease type changes.  Global atrophy without hydrocephalus.  No intracranial mass or abnormal enhancement.  Cervical spondylotic changes with very mild spinal stenosis C2-3 and C3-4.  Cervical medullary junction, pituitary region, pineal region and orbital structures unremarkable.  Minimal mucosal thickening paranasal sinuses.  IMPRESSION: No acute infarct.  Moderate small vessel disease type changes.  Global atrophy without hydrocephalus.  MRA HEAD  Findings:Mild irregularity with regions of narrowing and ectasia involving the cavernous segment of the internal carotid artery bilaterally.  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  Beyond this region,  anterior circulation without medium or large size vessel significant stenosis or occlusion.  Ectatic vertebral arteries and basilar artery.  Mild narrowing distal right vertebral artery.  Nonvisualization left PICA and right AICA.  Narrowed irregular right PICA and left AICA.  Superior cerebellar artery irregularity and narrowing more notable on the right.  Posterior cerebral artery.  mild branch vessel irregularity.  IMPRESSION: Mild irregularity with regions of narrowing and ectasia involving the cavernous segment of the internal carotid  artery bilaterally. 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  Posterior circulation branch vessel irregularity as noted above.  MRA NECK  Findings:Normal configuration of the origin of the great vessels from the aortic arch.  Ectatic right common carotid artery without significant narrowing.  Plaque right carotid bifurcation with 54% stenosis proximal right internal carotid artery.  Ectatic left common carotid artery without significant narrowing. No significant stenosis or irregularity of the left carotid bifurcation.  The right subclavian artery is not completely included present examination.  Mild to moderate narrowing and irregularity left subclavian artery.  Mild to slightly moderate narrowing proximal vertebral artery bilaterally.  The present exam may overestimate the degree of stenosis in this region.  Beyond this level, no significant stenosis of the vertebral artery.  IMPRESSION: 54% diameter stenosis proximal right internal carotid artery.  No significant stenosis or irregularity of the left carotid bifurcation.  The right subclavian artery is not completely included present examination.  Mild to moderate narrowing and irregularity left subclavian artery.  Mild to slightly moderate narrowing proximal vertebral artery bilaterally.  The present exam may overestimate the degree of stenosis  in this region.   Original Report Authenticated By: Genia Del, M.D.   Mr Jeri Cos Wo Contrast  04/23/2013   *RADIOLOGY REPORT*  Clinical Data:  Diabetic hypertensive patient with hypercholesterolemia presenting with slurred speech and blurred vision with nausea and vomiting.  MRI BRAIN WITH AND WITHOUT CONTRAST MRA HEAD WITHOUT CONTRAST MRA NECK WITHOUT AND WITH CONTRAST  Technique: Multiplanar, multiecho pulse sequences of the brain and surrounding structures were obtained according to standard protocol with and without intravenous contrast.  Angiographic images of the Circle of Willis were obtained using MRA technique without intravenous contrast.  Angiographic images of the neck were obtained using MRA technique without and with intravenous contrast.  Contrast:20 ml MultiHance.  Comparison:04/22/2013 CT.  11/23/2011 MR.  MRI HEAD  Findings: No acute infarct.  No intracranial hemorrhage.  Moderate small vessel disease type changes.  Global atrophy without hydrocephalus.  No intracranial mass or abnormal enhancement.  Cervical spondylotic changes with very mild spinal stenosis C2-3 and C3-4.  Cervical medullary junction, pituitary region, pineal region and orbital structures unremarkable.  Minimal mucosal thickening paranasal sinuses.  IMPRESSION: No acute infarct.  Moderate small vessel disease type changes.  Global atrophy without hydrocephalus.  MRA HEAD  Findings:Mild irregularity with regions of narrowing and ectasia involving the cavernous segment of the internal carotid artery bilaterally.  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  Beyond this region, anterior circulation without medium or large size vessel significant stenosis or occlusion.  Ectatic vertebral arteries and basilar artery.  Mild narrowing distal right vertebral artery.  Nonvisualization left PICA and right AICA.  Narrowed irregular right PICA and left  AICA.  Superior cerebellar artery irregularity and narrowing more notable on the right.  Posterior cerebral artery.  mild branch vessel irregularity.  IMPRESSION: Mild irregularity with regions of narrowing and ectasia involving the cavernous segment of the internal carotid artery bilaterally. 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  Posterior circulation branch vessel irregularity as noted above.  MRA NECK  Findings:Normal configuration of the origin of the great vessels from the aortic arch.  Ectatic right common carotid artery without significant narrowing.  Plaque right carotid bifurcation with  54% stenosis proximal right internal carotid artery.  Ectatic left common carotid artery without significant narrowing. No significant stenosis or irregularity of the left carotid bifurcation.  The right subclavian artery is not completely included present examination.  Mild to moderate narrowing and irregularity left subclavian artery.  Mild to slightly moderate narrowing proximal vertebral artery bilaterally.  The present exam may overestimate the degree of stenosis in this region.  Beyond this level, no significant stenosis of the vertebral artery.  IMPRESSION: 54% diameter stenosis proximal right internal carotid artery.  No significant stenosis or irregularity of the left carotid bifurcation.  The right subclavian artery is not completely included present examination.  Mild to moderate narrowing and irregularity left subclavian artery.  Mild to slightly moderate narrowing proximal vertebral artery bilaterally.  The present exam may overestimate the degree of stenosis in this region.   Original Report Authenticated By: Genia Del, M.D.   Mr Mra Head/brain Wo Cm  04/23/2013   *RADIOLOGY REPORT*  Clinical Data:  Diabetic hypertensive patient with hypercholesterolemia presenting with slurred speech and blurred vision with nausea  and vomiting.  MRI BRAIN WITH AND WITHOUT CONTRAST MRA HEAD WITHOUT CONTRAST MRA NECK WITHOUT AND WITH CONTRAST  Technique: Multiplanar, multiecho pulse sequences of the brain and surrounding structures were obtained according to standard protocol with and without intravenous contrast.  Angiographic images of the Circle of Willis were obtained using MRA technique without intravenous contrast.  Angiographic images of the neck were obtained using MRA technique without and with intravenous contrast.  Contrast:20 ml MultiHance.  Comparison:04/22/2013 CT.  11/23/2011 MR.  MRI HEAD  Findings: No acute infarct.  No intracranial hemorrhage.  Moderate small vessel disease type changes.  Global atrophy without hydrocephalus.  No intracranial mass or abnormal enhancement.  Cervical spondylotic changes with very mild spinal stenosis C2-3 and C3-4.  Cervical medullary junction, pituitary region, pineal region and orbital structures unremarkable.  Minimal mucosal thickening paranasal sinuses.  IMPRESSION: No acute infarct.  Moderate small vessel disease type changes.  Global atrophy without hydrocephalus.  MRA HEAD  Findings:Mild irregularity with regions of narrowing and ectasia involving the cavernous segment of the internal carotid artery bilaterally.  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  Beyond this region, anterior circulation without medium or large size vessel significant stenosis or occlusion.  Ectatic vertebral arteries and basilar artery.  Mild narrowing distal right vertebral artery.  Nonvisualization left PICA and right AICA.  Narrowed irregular right PICA and left AICA.  Superior cerebellar artery irregularity and narrowing more notable on the right.  Posterior cerebral artery.  mild branch vessel irregularity.  IMPRESSION: Mild irregularity with regions of narrowing and ectasia involving the cavernous segment of the internal  carotid artery bilaterally. 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  Posterior circulation branch vessel irregularity as noted above.  MRA NECK  Findings:Normal configuration of the origin of the great vessels from the aortic arch.  Ectatic right common carotid artery without significant narrowing.  Plaque right carotid bifurcation with 54% stenosis proximal right internal carotid artery.  Ectatic left common carotid artery without significant narrowing. No significant stenosis or irregularity of the left carotid bifurcation.  The right subclavian artery is not completely included present examination.  Mild to moderate narrowing and irregularity left subclavian artery.  Mild to slightly moderate narrowing proximal vertebral artery bilaterally.  The present exam may  overestimate the degree of stenosis in this region.  Beyond this level, no significant stenosis of the vertebral artery.  IMPRESSION: 54% diameter stenosis proximal right internal carotid artery.  No significant stenosis or irregularity of the left carotid bifurcation.  The right subclavian artery is not completely included present examination.  Mild to moderate narrowing and irregularity left subclavian artery.  Mild to slightly moderate narrowing proximal vertebral artery bilaterally.  The present exam may overestimate the degree of stenosis in this region.   Original Report Authenticated By: Genia Del, M.D.    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 Does the need for close, 24 hr/day medical supervision in concert with the patient's rehab needs make it unreasonable for this patient to be served in a less intensive setting? Potentially 2. Co-Morbidities requiring supervision/potential complications: type 2 dm, anemia, htn 3. Due to bladder management, bowel management, safety,  skin/wound care, disease management, medication administration, pain management and patient education, does the patient require 24 hr/day rehab nursing? Potentially 4. Does the patient require coordinated care of a physician, rehab nurse, PT (1-2 hrs/day, 5 days/week) and OT (1-2 hrs/day, 5 days/week) to address physical and functional deficits in the context of the above medical diagnosis(es)? Potentially Addressing deficits in the following areas: balance, endurance, locomotion, strength, transferring, bowel/bladder control, bathing, dressing, feeding, grooming, toileting and psychosocial support 5. Can the patient actively participate in an intensive therapy program of at least 3 hrs of therapy per day at least 5 days per week? Yes 6. The potential for patient to make measurable gains while on inpatient rehab is fair 7. Anticipated functional outcomes upon discharge from inpatient rehab are ?mod I to supervision with PT, ?mod I to supervision with OT, n/a with SLP. 8. Estimated rehab length of stay to reach the above functional goals is: ?one week 9. Does the patient have adequate social supports to accommodate these discharge functional goals? Potentially 10. Anticipated D/C setting: Home 11. Anticipated post D/C treatments: Outpt therapy 12. Overall Rehab/Functional Prognosis: good  RECOMMENDATIONS: This patient's condition is appropriate for continued rehabilitative care in the following setting: CIR vs [place] Patient has agreed to participate in recommended program. Potentially Note that insurance prior authorization may be required for reimbursement for recommended care.  Comment: Pt seems to have shown some improvement after PT session although still limited during later OT session today. Consider brief CIR stay. Rehab RN to follow up.   Meredith Staggers, MD, Mell[facility] Drown     04/24/2013

## 2013-04-24 NOTE — Progress Notes (Signed)
Stroke Team Progress Note  HISTORY Ryan Jimenez is an 79 y.o. male who reports that he went to bed normal last evening 04/22/2013 feeling at his baseline. He awakened at about 3am to use the bathroom and was dizzy. He remained dizzy throughout the day particularly when he sat or stood up. His wife also noted that his speech was slurred. Once in the ED 04/23/2013 the patient felt hat he noted some left sided weakness and decreased sensation on the left. He has remained ambulatory. Patient has had some accompanying nausea and vomiting today. Reports that his appetite has been poor for the past week and he has eaten very little. Patient was not a TPA candidate secondary to being  Outside time window. He was admitted for further evaluation and treatment.  SUBJECTIVE His wife is at the bedside.  Overall he feels his condition is stable. Dizziness remains.  OBJECTIVE Most recent Vital Signs: Filed Vitals:   04/23/13 1821 04/23/13 2219 04/24/13 0619 04/24/13 0951  BP: 154/68 154/77 148/76 138/72  Pulse: 65 63 69 69  Temp: 98.2 F (36.8 C) 98.6 F (37 C) 97.9 F (36.6 C) 98.4 F (36.9 C)  TempSrc: Oral Oral Oral Oral  Resp: 15 18 15 18   Height:      Weight:      SpO2: 94% 97% 98% 100%   CBG (last 3)   Recent Labs  04/23/13 1637 04/23/13 2212 04/24/13 0733  GLUCAP 128* 100* 119*    IV Fluid Intake:   . sodium chloride 20 mL/hr at 04/23/13 0313  . sodium chloride 75 mL/hr at 04/24/13 0430    MEDICATIONS  . aspirin EC  81 mg Oral Daily  . atorvastatin  20 mg Oral q1800  . azaTHIOprine  100 mg Oral Daily  . clopidogrel  75 mg Oral Q breakfast  . cyclophosphamide  50 mg Oral QAC breakfast  . diclofenac sodium  4 g Topical BID  . dutasteride  0.5 mg Oral Daily  . heparin  5,000 Units Subcutaneous Q8H  . insulin aspart  0-9 Units Subcutaneous TID WC  . lactobacillus acidophilus & bulgar  1 tablet Oral TID  . potassium chloride  40 mEq Oral Daily  . tamsulosin  0.4 mg Oral QHS  .  [START ON 04/28/2013] Vitamin D (Ergocalciferol)  50,000 Units Oral Q Sun   PRN:  colchicine, ondansetron, senna-docusate  Diet:  Carb Control thin liquids Activity:  OOB with assistance DVT Prophylaxis:  Heparin 5000 units sq tid   CLINICALLY SIGNIFICANT STUDIES Basic Metabolic Panel:   Recent Labs Lab 04/22/13 2230 04/23/13 0450 04/23/13 1350 04/23/13 1351  NA 139 140  --   --   K 3.3* 2.9* 3.2*  --   CL 100 100  --   --   CO2 32 31  --   --   GLUCOSE 166* 182*  --   --   BUN 14 14  --   --   CREATININE 1.23 1.28  --   --   CALCIUM 9.6 9.0  --   --   MG  --   --   --  2.1   Liver Function Tests:   Recent Labs Lab 04/22/13 2230 04/23/13 0450  AST 21 18  ALT 11 11  ALKPHOS 47 41  BILITOT 0.4 0.3  PROT 7.3 6.7  ALBUMIN 3.9 3.4*   CBC:   Recent Labs Lab 04/22/13 2230 04/23/13 0450  WBC 3.4* 3.2*  NEUTROABS 2.4 1.9  HGB  12.1* 11.0*  HCT 35.5* 32.5*  MCV 84.7 85.3  PLT 145* 138*   Coagulation:   Recent Labs Lab 04/22/13 2230  LABPROT 13.6  INR 1.05   Cardiac Enzymes:   Recent Labs Lab 04/22/13 2230  TROPONINI <0.30   Urinalysis:   Recent Labs Lab 04/22/13 2301  COLORURINE YELLOW  LABSPEC 1.015  PHURINE 7.0  GLUCOSEU NEGATIVE  HGBUR NEGATIVE  BILIRUBINUR NEGATIVE  KETONESUR NEGATIVE  PROTEINUR 30*  UROBILINOGEN 0.2  NITRITE NEGATIVE  LEUKOCYTESUR NEGATIVE   Lipid Panel    Component Value Date/Time   CHOL 117 04/23/2013 0500   TRIG 101 04/23/2013 0500   HDL 57 04/23/2013 0500   CHOLHDL 2.1 04/23/2013 0500   VLDL 20 04/23/2013 0500   LDLCALC 40 04/23/2013 0500   HgbA1C  Lab Results  Component Value Date   HGBA1C 6.6* 04/23/2013    Urine Drug Screen:   No results found for this basename: labopia,  cocainscrnur,  labbenz,  amphetmu,  thcu,  labbarb    Alcohol Level: No results found for this basename: ETH,  in the last 168 hours  CT of the brain  04/22/2013  No acute findings.  Stable atrophy.   MRI of the brain  04/23/2013 No  acute infarct.  Moderate small vessel disease type changes.  Global atrophy without hydrocephalus.   MRA of the brain 04/23/2013    Mild irregularity with regions of narrowing and ectasia involving the cavernous segment of the internal carotid artery bilaterally. 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  Posterior circulation branch vessel irregularity   MRA Neck 04/23/2013   54% diameter stenosis proximal right internal carotid artery.  No significant stenosis or irregularity of the left carotid bifurcation.  The right subclavian artery is not completely included present examination.  Mild to moderate narrowing and irregularity left subclavian artery.  Mild to slightly moderate narrowing proximal vertebral artery bilaterally.  The present exam may overestimate the degree of stenosis in this region.    2D Echocardiogram    Carotid Doppler  See MRA neck  CXR  04/23/2013   No active disease.     EKG  normal sinus rhythm, RBBB.   Therapy Recommendations CIR  Physical Exam   Pleasant elderly male not in distress.Awake alert. Afebrile. Head is nontraumatic. Neck is supple without bruit. Hearing is normal. Cardiac exam no murmur or gallop. Lungs are clear to auscultation. Distal pulses are well felt. Neurological Exam : Awake alert oriented x3 with normal speech and language function. Fundi were not visualized. Vision acutely and fields appear normal. Eye movements are full range with gaze evoked lateral nystagmus on left gaze. Face is symmetric. Tongue is midline. Motor system exam reveals no upper or lower to drift. Fine finger movements are symmetric. Minimum left finger-to-nose incoordination. Lower extremity strength sensation and coordination normal. Gait was not tested. ASSESSMENT [name] is a 79 y.o. male presenting with dizziness and slurred speech, who developed significant left nystagmus. Dx: small  right brainstem infarct not seen on MRI. Infarct felt to be thrombotic secondary to small vessel disease.  On aspirin 81 mg orally every day prior to admission. Now on aspirin 81 mg orally every day and clopidogrel 75 mg orally every day for secondary stroke prevention. Patient with resultant left nystagumus, mild left hemiparesis, dizziness, dysarthria. Nausea and vomiting has resolved. Work up underway.   Hypertension  Diabetes, HgbA1c 6.6 goal <  7.0  Hyperlipidemia, LDL 40, on crestor 10 PTA, now on crestor 10 mg daily, goal LDL < 100 (< 70 for diabetics)   Hospital day # 2  TREATMENT/PLAN  Continue clopidogrel 75 mg orally every day alone for secondary stroke prevention. Stop daily aspirin. No indication for dual antiplatelets  Rehab consult  F/u 2D echo  Ongoing risk factor control  SHARON BIBY, MSN, RN, ANVP-BC, ANP-BC, GNP-BC [facility] Pager: [phone] 04/24/2013 10:12 AM  I have personally obtained a history, examined the patient, evaluated imaging results, and formulated the assessment and plan of care. I agree with the above. Antony Contras, MD

## 2013-04-24 NOTE — Progress Notes (Signed)
Will follow pt for possible CIR admit.  [phone]

## 2013-04-24 NOTE — Progress Notes (Signed)
[facility] PROGRESS NOTE  Ryan Jimenez [id number] DOB: [birth year] DOA: 04/22/2013 PCP: Philis Fendt, MD  Assessment/Plan: Principal Problem:   CVA (cerebral infarction) Active Problems:   Hypercholesterolemia   Hypertension   Renal failure   Type 2 diabetes mellitus    Weakness Recommend:  1. HgbA1c, pending 2. 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 PT consult, OT consult, Speech consult pending 4. Echocardiogram results shows EF of 99991111, grade 1 diastolic dysfunction 6. Prophylactic therapy-Antiplatelet med: Plavix - dose 75 mg daily  7. Risk factor modification  8. Telemetry monitoring  9. Frequent neuro checks    C-ANCA positive vasculitis/chronic kidney disease - continue immunosuppressants.   Diabetes mellitus type 2 - closely follow CBGs. Patient has been placed on sliding-scale coverage.   Hypertension - continue home medications.   Hyperlipidemia - continue statins.   History of gout - continue present medications.   History of Hiradinitis suppuritiva.  Code Status: full Family Communication: family updated about patient's clinical progress Disposition Plan:  As above    Brief narrative: 79 y.o. 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 Patient's symptoms were persistent through the day and later presented to the ER. Patient denies any loss of function of upper or lower extremities denies any headache or any difficulty swallowing. Patient's wife felt that he had some asymmetry of his face. Patient denies any headache chest pain shortness of breath. Patient was recently placed on Cipro for UTI. CT head is negative for anything acute and  patient has been made for possible CVA. Neurologist on-call Dr. Doy Mince has been consulted. Patient is not a candidate for TPA because symptoms were beyond the window period   Consultants: neurology  Procedures:  none  Antibiotics: None  HPI/Subjective: Stable, weakness improving  Objective: Filed Vitals:   04/23/13 1821 04/23/13 2219 04/24/13 0619 04/24/13 0951  BP: 154/68 154/77 148/76 138/72  Pulse: 65 63 69 69  Temp: 98.2 F (36.8 C) 98.6 F (37 C) 97.9 F (36.6 C) 98.4 F (36.9 C)  TempSrc: Oral Oral Oral Oral  Resp: 15 18 15 18   Height:      Weight:      SpO2: 94% 97% 98% 100%    Intake/Output Summary (Last 24 hours) at 04/24/13 1012 Last data filed at 04/24/13 0823  Gross per 24 hour  Intake    360 ml  Output   1600 ml  Net  -1240 ml    Exam:  HENT:  Head: Atraumatic.  Nose: Nose normal.  Mouth/Throat: Oropharynx is clear and moist.  Eyes: Conjunctivae are normal. Pupils are equal, round, and reactive to light. No scleral icterus.  Neck: Neck supple. No tracheal deviation present.  Cardiovascular: Normal rate, regular rhythm, normal heart sounds and intact distal pulses.  Pulmonary/Chest: Effort normal and breath sounds normal. No respiratory distress.  Abdominal: Soft. Normal appearance and bowel sounds are normal. She exhibits no distension. There is no tenderness.  Musculoskeletal: She exhibits no edema and no tenderness.  Neurological: She is alert. No cranial nerve deficit.    Data Reviewed: Basic Metabolic Panel:  Recent Labs Lab 04/22/13 2230 04/23/13 0450 04/23/13 1350 04/23/13 1351  NA 139 140  --   --   K  3.3* 2.9* 3.2*  --   CL 100 100  --   --   CO2 32 31  --   --   GLUCOSE 166* 182*  --   --   BUN 14 14  --   --   CREATININE 1.23 1.28  --   --   CALCIUM 9.6 9.0  --   --   MG  --   --   --  2.1    Liver Function Tests:  Recent Labs Lab 04/22/13 2230 04/23/13 0450  AST 21 18  ALT 11 11  ALKPHOS 47 41  BILITOT 0.4  0.3  PROT 7.3 6.7  ALBUMIN 3.9 3.4*   No results found for this basename: LIPASE, AMYLASE,  in the last 168 hours No results found for this basename: AMMONIA,  in the last 168 hours  CBC:  Recent Labs Lab 04/22/13 2230 04/23/13 0450  WBC 3.4* 3.2*  NEUTROABS 2.4 1.9  HGB 12.1* 11.0*  HCT 35.5* 32.5*  MCV 84.7 85.3  PLT 145* 138*    Cardiac Enzymes:  Recent Labs Lab 04/22/13 2230  TROPONINI <0.30   BNP (last 3 results) No results found for this basename: PROBNP,  in the last 8760 hours   CBG:  Recent Labs Lab 04/23/13 0625 04/23/13 1317 04/23/13 1637 04/23/13 2212 04/24/13 0733  GLUCAP 137* 154* 128* 100* 119*    No results found for this or any previous visit (from the past 240 hour(s)).   Studies: Dg Chest 2 View  04/23/2013   *RADIOLOGY REPORT*  Clinical Data: Stroke.  CHEST - 2 VIEW  Comparison: 04/04/2012  Findings: Low lung volumes are seen however both lungs are clear. Heart size within normal limits. No evidence of pleural effusion. No mass or lymphadenopathy identified.  IMPRESSION: No active disease.   Original Report Authenticated By: Earle Gell, M.D.   Ct Head Wo Contrast  04/22/2013   *RADIOLOGY REPORT*  Clinical Data: Dizziness and blurred vision.  CT HEAD WITHOUT CONTRAST  Technique:  Contiguous axial images were obtained from the base of the skull through the vertex without contrast.  Comparison: 11/21/2011  Findings: Stable cortical atrophy. The brain demonstrates no evidence of hemorrhage, infarction, edema, mass effect, extra-axial fluid collection, hydrocephalus or mass lesion.  The skull is unremarkable.  IMPRESSION: No acute findings.  Stable atrophy.   Original Report Authenticated By: Aletta Edouard, M.D.   Mr Angiogram Neck W Wo Contrast  04/23/2013   *RADIOLOGY REPORT*  Clinical Data:  Diabetic hypertensive patient with hypercholesterolemia presenting with slurred speech and blurred vision with nausea and vomiting.  MRI BRAIN WITH AND  WITHOUT CONTRAST MRA HEAD WITHOUT CONTRAST MRA NECK WITHOUT AND WITH CONTRAST  Technique: Multiplanar, multiecho pulse sequences of the brain and surrounding structures were obtained according to standard protocol with and without intravenous contrast.  Angiographic images of the Circle of Willis were obtained using MRA technique without intravenous contrast.  Angiographic images of the neck were obtained using MRA technique without and with intravenous contrast.  Contrast:20 ml MultiHance.  Comparison:04/22/2013 CT.  11/23/2011 MR.  MRI HEAD  Findings: No acute infarct.  No intracranial hemorrhage.  Moderate small vessel disease type changes.  Global atrophy without hydrocephalus.  No intracranial mass or abnormal enhancement.  Cervical spondylotic changes with very mild spinal stenosis C2-3 and C3-4.  Cervical medullary junction, pituitary region, pineal region and orbital structures unremarkable.  Minimal mucosal thickening paranasal sinuses.  IMPRESSION: No acute infarct.  Moderate small vessel disease type  changes.  Global atrophy without hydrocephalus.  MRA HEAD  Findings:Mild irregularity with regions of narrowing and ectasia involving the cavernous segment of the internal carotid artery bilaterally.  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  Beyond this region, anterior circulation without medium or large size vessel significant stenosis or occlusion.  Ectatic vertebral arteries and basilar artery.  Mild narrowing distal right vertebral artery.  Nonvisualization left PICA and right AICA.  Narrowed irregular right PICA and left AICA.  Superior cerebellar artery irregularity and narrowing more notable on the right.  Posterior cerebral artery.  mild branch vessel irregularity.  IMPRESSION: Mild irregularity with regions of narrowing and ectasia involving the cavernous segment of the internal carotid artery bilaterally. 1.4 mm  aneurysm of the left internal carotid artery cavernous segment versus bulge as result of atherosclerotic type changes.  Posterior circulation branch vessel irregularity as noted above.  MRA NECK  Findings:Normal configuration of the origin of the great vessels from the aortic arch.  Ectatic right common carotid artery without significant narrowing.  Plaque right carotid bifurcation with 54% stenosis proximal right internal carotid artery.  Ectatic left common carotid artery without significant narrowing. No significant stenosis or irregularity of the left carotid bifurcation.  The right subclavian artery is not completely included present examination.  Mild to moderate narrowing and irregularity left subclavian artery.  Mild to slightly moderate narrowing proximal vertebral artery bilaterally.  The present exam may overestimate the degree of stenosis in this region.  Beyond this level, no significant stenosis of the vertebral artery.  IMPRESSION: 54% diameter stenosis proximal right internal carotid artery.  No significant stenosis or irregularity of the left carotid bifurcation.  The right subclavian artery is not completely included present examination.  Mild to moderate narrowing and irregularity left subclavian artery.  Mild to slightly moderate narrowing proximal vertebral artery bilaterally.  The present exam may overestimate the degree of stenosis in this region.   Original Report Authenticated By: Genia Del, M.D.   Mr Jeri Cos Wo Contrast  04/23/2013   *RADIOLOGY REPORT*  Clinical Data:  Diabetic hypertensive patient with hypercholesterolemia presenting with slurred speech and blurred vision with nausea and vomiting.  MRI BRAIN WITH AND WITHOUT CONTRAST MRA HEAD WITHOUT CONTRAST MRA NECK WITHOUT AND WITH CONTRAST  Technique: Multiplanar, multiecho pulse sequences of the brain and surrounding structures were obtained according to standard protocol with and without intravenous contrast.  Angiographic  images of the Circle of Willis were obtained using MRA technique without intravenous contrast.  Angiographic images of the neck were obtained using MRA technique without and with intravenous contrast.  Contrast:20 ml MultiHance.  Comparison:04/22/2013 CT.  11/23/2011 MR.  MRI HEAD  Findings: No acute infarct.  No intracranial hemorrhage.  Moderate small vessel disease type changes.  Global atrophy without hydrocephalus.  No intracranial mass or abnormal enhancement.  Cervical spondylotic changes with very mild spinal stenosis C2-3 and C3-4.  Cervical medullary junction, pituitary region, pineal region and orbital structures unremarkable.  Minimal mucosal thickening paranasal sinuses.  IMPRESSION: No acute infarct.  Moderate small vessel disease type changes.  Global atrophy without hydrocephalus.  MRA HEAD  Findings:Mild irregularity with regions of narrowing and ectasia involving the cavernous segment of the internal carotid artery bilaterally.  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  Beyond this region, anterior circulation without medium or large size vessel significant stenosis  or occlusion.  Ectatic vertebral arteries and basilar artery.  Mild narrowing distal right vertebral artery.  Nonvisualization left PICA and right AICA.  Narrowed irregular right PICA and left AICA.  Superior cerebellar artery irregularity and narrowing more notable on the right.  Posterior cerebral artery.  mild branch vessel irregularity.  IMPRESSION: Mild irregularity with regions of narrowing and ectasia involving the cavernous segment of the internal carotid artery bilaterally. 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  Posterior circulation branch vessel irregularity as noted above.  MRA NECK  Findings:Normal configuration of the origin of the great vessels from  the aortic arch.  Ectatic right common carotid artery without significant narrowing.  Plaque right carotid bifurcation with 54% stenosis proximal right internal carotid artery.  Ectatic left common carotid artery without significant narrowing. No significant stenosis or irregularity of the left carotid bifurcation.  The right subclavian artery is not completely included present examination.  Mild to moderate narrowing and irregularity left subclavian artery.  Mild to slightly moderate narrowing proximal vertebral artery bilaterally.  The present exam may overestimate the degree of stenosis in this region.  Beyond this level, no significant stenosis of the vertebral artery.  IMPRESSION: 54% diameter stenosis proximal right internal carotid artery.  No significant stenosis or irregularity of the left carotid bifurcation.  The right subclavian artery is not completely included present examination.  Mild to moderate narrowing and irregularity left subclavian artery.  Mild to slightly moderate narrowing proximal vertebral artery bilaterally.  The present exam may overestimate the degree of stenosis in this region.   Original Report Authenticated By: Genia Del, M.D.   Mr Mra Head/brain Wo Cm  04/23/2013   *RADIOLOGY REPORT*  Clinical Data:  Diabetic hypertensive patient with hypercholesterolemia presenting with slurred speech and blurred vision with nausea and vomiting.  MRI BRAIN WITH AND WITHOUT CONTRAST MRA HEAD WITHOUT CONTRAST MRA NECK WITHOUT AND WITH CONTRAST  Technique: Multiplanar, multiecho pulse sequences of the brain and surrounding structures were obtained according to standard protocol with and without intravenous contrast.  Angiographic images of the Circle of Willis were obtained using MRA technique without intravenous contrast.  Angiographic images of the neck were obtained using MRA technique without and with intravenous contrast.  Contrast:20 ml MultiHance.  Comparison:04/22/2013 CT.  11/23/2011  MR.  MRI HEAD  Findings: No acute infarct.  No intracranial hemorrhage.  Moderate small vessel disease type changes.  Global atrophy without hydrocephalus.  No intracranial mass or abnormal enhancement.  Cervical spondylotic changes with very mild spinal stenosis C2-3 and C3-4.  Cervical medullary junction, pituitary region, pineal region and orbital structures unremarkable.  Minimal mucosal thickening paranasal sinuses.  IMPRESSION: No acute infarct.  Moderate small vessel disease type changes.  Global atrophy without hydrocephalus.  MRA HEAD  Findings:Mild irregularity with regions of narrowing and ectasia involving the cavernous segment of the internal carotid artery bilaterally.  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  Beyond this region, anterior circulation without medium or large size vessel significant stenosis or occlusion.  Ectatic vertebral arteries and basilar artery.  Mild narrowing distal right vertebral artery.  Nonvisualization left PICA and right AICA.  Narrowed irregular right PICA and left AICA.  Superior cerebellar artery irregularity and narrowing more notable on the right.  Posterior cerebral artery.  mild branch vessel irregularity.  IMPRESSION: Mild irregularity with regions of narrowing and ectasia  involving the cavernous segment of the internal carotid artery bilaterally. 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  Posterior circulation branch vessel irregularity as noted above.  MRA NECK  Findings:Normal configuration of the origin of the great vessels from the aortic arch.  Ectatic right common carotid artery without significant narrowing.  Plaque right carotid bifurcation with 54% stenosis proximal right internal carotid artery.  Ectatic left common carotid artery without significant narrowing. No significant stenosis or  irregularity of the left carotid bifurcation.  The right subclavian artery is not completely included present examination.  Mild to moderate narrowing and irregularity left subclavian artery.  Mild to slightly moderate narrowing proximal vertebral artery bilaterally.  The present exam may overestimate the degree of stenosis in this region.  Beyond this level, no significant stenosis of the vertebral artery.  IMPRESSION: 54% diameter stenosis proximal right internal carotid artery.  No significant stenosis or irregularity of the left carotid bifurcation.  The right subclavian artery is not completely included present examination.  Mild to moderate narrowing and irregularity left subclavian artery.  Mild to slightly moderate narrowing proximal vertebral artery bilaterally.  The present exam may overestimate the degree of stenosis in this region.   Original Report Authenticated By: Genia Del, M.D.    Scheduled Meds: . aspirin EC  81 mg Oral Daily  . atorvastatin  20 mg Oral q1800  . azaTHIOprine  100 mg Oral Daily  . clopidogrel  75 mg Oral Q breakfast  . cyclophosphamide  50 mg Oral QAC breakfast  . diclofenac sodium  4 g Topical BID  . dutasteride  0.5 mg Oral Daily  . heparin  5,000 Units Subcutaneous Q8H  . insulin aspart  0-9 Units Subcutaneous TID WC  . lactobacillus acidophilus & bulgar  1 tablet Oral TID  . potassium chloride  40 mEq Oral Daily  . tamsulosin  0.4 mg Oral QHS  . [START ON 04/28/2013] Vitamin D (Ergocalciferol)  50,000 Units Oral Q Sun   Continuous Infusions: . sodium chloride 20 mL/hr at 04/23/13 0313  . sodium chloride 75 mL/hr at 04/24/13 0430    Principal Problem:   CVA (cerebral infarction) Active Problems:   Hypercholesterolemia   Hypertension   Renal failure   Type 2 diabetes mellitus    Time spent: 40 minutes   [facility] Pager [phone]. If 8PM-8AM, please contact night-coverage at [email or web address], password [email or web address] 04/24/2013, 10:12  AM  LOS: 2 days

## 2013-04-24 NOTE — Evaluation (Signed)
Occupational Therapy Evaluation Patient Details Name: Ryan Jimenez MRN: [id number] DOB: [birth year] Today's Date: 04/24/2013 Time: [id number] OT Time Calculation (min): 36 min  OT Assessment / Plan / Recommendation Clinical Impression   This 79 y.o. Male admitted with dizziness.  MRI negative, but felt to have had small brainstem infarct not seen on MRI.  Pt demonstrates mild nystagmus with horizontal gaze, dizziness, impaired balance, and questionable Rt. UE weakness.  Pt. Would benefit from continued OT to maximize safety and independence with BADLs. To allow him to return home at supervision level with spouse.  Feel he would benefit from CIR stay to allow him to return to a supervision to modified independent level.     OT Assessment  Patient needs continued OT Services    Follow Up Recommendations  CIR;Supervision/Assistance - 24 hour    Barriers to Discharge None    Equipment Recommendations  Tub/shower bench    Recommendations for Other Services Rehab consult  Frequency  Min 2X/week    Precautions / Restrictions Precautions Precautions: Fall Restrictions Weight Bearing Restrictions: No   Pertinent Vitals/Pain     ADL  Eating/Feeding: Independent Where Assessed - Eating/Feeding: Chair Grooming: Wash/dry hands;Wash/dry face;Teeth care;Supervision/safety Where Assessed - Grooming: Unsupported sitting Upper Body Bathing: Supervision/safety Where Assessed - Upper Body Bathing: Unsupported sitting Lower Body Bathing: Minimal assistance Where Assessed - Lower Body Bathing: Supported sit to stand Upper Body Dressing: Supervision/safety Where Assessed - Upper Body Dressing: Unsupported sitting Lower Body Dressing: Minimal assistance Where Assessed - Lower Body Dressing: Supported sit to Lobbyist: Moderate assistance Toilet Transfer Method: Sit to stand;Stand pivot Toilet Transfer Equipment: Comfort height toilet Toileting - Clothing Manipulation and Hygiene:  Minimal assistance Where Assessed - Toileting Clothing Manipulation and Hygiene: Standing Transfers/Ambulation Related to ADLs: Min A initially with ambulation progressing to mod A with onset of dizziness.  Mildly ataxic gait ADL Comments: Pt. requires min A for LB ADLs for balance in standing.  Pt braces LEs against bed to steady self.  Pt. initially denied dizziness with transitions with supine to sit, and minimal dizziness with standing; however, pt with increasing dizziness with ambulation in room    OT Diagnosis: Generalized weakness;Disturbance of vision;Ataxia  OT Problem List: Decreased strength;Decreased activity tolerance;Impaired balance (sitting and/or standing);Impaired vision/perception;Decreased knowledge of use of DME or AE OT Treatment Interventions: Self-care/ADL training;Neuromuscular education;DME and/or AE instruction;Therapeutic activities;Patient/family education;Balance training;Visual/perceptual remediation/compensation   OT Goals Acute Rehab OT Goals OT Goal Formulation: With patient/family Time For Goal Achievement: 05/08/13 Potential to Achieve Goals: Good ADL Goals Pt Will Perform Grooming: with supervision;Standing at sink ADL Goal: Grooming - Progress: Goal set today Pt Will Perform Lower Body Bathing: with supervision;Sit to stand from chair;Sit to stand from bed ADL Goal: Lower Body Bathing - Progress: Goal set today Pt Will Perform Lower Body Dressing: with supervision;Sit to stand from chair;Sit to stand from bed ADL Goal: Lower Body Dressing - Progress: Goal set today Pt Will Transfer to Toilet: with supervision;Ambulation;Comfort height toilet ADL Goal: Toilet Transfer - Progress: Goal set today Pt Will Perform Toileting - Clothing Manipulation: with supervision;Standing ADL Goal: Toileting - Clothing Manipulation - Progress: Goal set today Additional ADL Goal #1: Pt will be independent with HEP for vision ADL Goal: Additional Goal #1 - Progress: Goal  set today Additional ADL Goal #2: Pt will peform all BADLs with no complaint dizziness ADL Goal: Additional Goal #2 - Progress: Goal set today  Visit Information  Last OT Received On: 04/24/13 Assistance Needed: +1  Subjective Data  Subjective: "I want to get up on my feet" Patient Stated Goal: To get better   Prior Functioning     Home Living Lives With: Spouse Available Help at Discharge: Available 24 hours/day Type of Home: House Home Access: Stairs to enter [employer] of Steps: 2 Entrance Stairs-Rails: None Home Layout: One level Bathroom Shower/Tub: Chiropodist: Standard Home Adaptive Equipment: Straight cane;Walker - rolling;Bedside commode/3-in-1;Tub transfer bench Additional Comments: Pt splits time between [place] and [place] Prior Function Level of Independence: Independent with assistive device(s) (used cane) Able to Take Stairs?: Yes Driving: Yes Vocation: Retired Corporate investment banker: No difficulties Dominant Hand: Right         Vision/Perception Vision - History Baseline Vision: Wears glasses all the time Patient Visual Report: Blurring of vision Vision - Assessment Eye Alignment: Within Functional Limits Vision Assessment: Vision tested Ocular Range of Motion: Other (comment) (difficulty sustaining Rt. gaze right eye. ) Tracking/Visual Pursuits: Other (comment);Decreased smoothness of horizontal tracking (see comments) Convergence: Impaired (comment) Visual Fields: No apparent deficits Additional Comments: Pursuits: OD: Pt with horizontal nystagmus.  Difficulty sustaining Rt. lateral gaze and superior gaze;  OS mild difficulty sustaining superior gaze.  Convergence to ~16" from nose.  Pt c/o intermittent blurry vision.  Fields WFL; Able to read paragraph of small print  Perception Perception: Within Functional Limits Praxis Praxis: Intact   Cognition  Cognition Arousal/Alertness: Awake/alert Behavior  During Therapy: WFL for tasks assessed/performed Overall Cognitive Status: Within Functional Limits for tasks assessed    Extremity/Trunk Assessment Right Upper Extremity Assessment RUE ROM/Strength/Tone: Deficits RUE ROM/Strength/Tone Deficits: 4/5 RUE Sensation: WFL - Light Touch RUE Coordination: WFL - gross/fine motor Left Upper Extremity Assessment LUE ROM/Strength/Tone: Within functional levels LUE Sensation: WFL - Light Touch LUE Coordination: WFL - gross/fine motor Right Lower Extremity Assessment RLE ROM/Strength/Tone: WFL for tasks assessed Left Lower Extremity Assessment LLE ROM/Strength/Tone: WFL for tasks assessed Trunk Assessment Trunk Assessment: Normal     Mobility Bed Mobility Bed Mobility: Supine to Sit;Sitting - Scoot to [employer] of Bed Rolling Right: 4: Min assist Right Sidelying to Sit: 4: Min assist;With rails Supine to Sit: 4: Min guard Sitting - Scoot to [employer] of Bed: 4: Min guard Details for Bed Mobility Assistance: min guard due to dizziness Transfers Transfers: Sit to Stand;Stand to Sit Sit to Stand: 4: Min assist;With upper extremity assist;From bed Stand to Sit: 3: Mod assist;With upper extremity assist;To bed Details for Transfer Assistance: Pt steadied self against bed with sit to stand.  Pt. reports minimal dizziness with standing.  When ambulation dizziness increased with decrease in balance.  Mod A for stand to sit     Exercise     Balance Static Standing Balance Static Standing - Balance Support: Left upper extremity supported;During functional activity Static Standing - Level of Assistance: 4: Min assist Static Standing - Comment/# of Minutes: 3 minutes needed steadying assist as pt dizzy. Dynamic Standing Balance Dynamic Standing - Balance Support: No upper extremity supported Dynamic Standing - Level of Assistance: 4: Min assist Dynamic Standing - Balance Activities: Other (comment) (donning belt) Dynamic Standing - Comments: Pt braced legs  against bed   End of Session OT - End of Session Activity Tolerance: Patient tolerated treatment well Patient left: in chair;with call bell/phone within reach  [place], Ronzell Laban M 04/24/2013, 3:24 PM

## 2013-04-25 ENCOUNTER — Inpatient Hospital Stay (HOSPITAL_COMMUNITY)
Admission: RE | Admit: 2013-04-25 | Discharge: 2013-05-02 | DRG: 945 | Disposition: A | Discharge status: Home Health | Payer: Medicare Other | Source: Intra-hospital | Attending: Physical Medicine & Rehabilitation | Admitting: Physical Medicine & Rehabilitation

## 2013-04-25 DIAGNOSIS — Z9849 Cataract extraction status, unspecified eye: Secondary | ICD-10-CM

## 2013-04-25 DIAGNOSIS — I6330 Cerebral infarction due to thrombosis of unspecified cerebral artery: Secondary | ICD-10-CM

## 2013-04-25 DIAGNOSIS — Z7902 Long term (current) use of antithrombotics/antiplatelets: Secondary | ICD-10-CM

## 2013-04-25 DIAGNOSIS — Z961 Presence of intraocular lens: Secondary | ICD-10-CM

## 2013-04-25 DIAGNOSIS — H8110 Benign paroxysmal vertigo, unspecified ear: Secondary | ICD-10-CM

## 2013-04-25 DIAGNOSIS — M109 Gout, unspecified: Secondary | ICD-10-CM

## 2013-04-25 DIAGNOSIS — E119 Type 2 diabetes mellitus without complications: Secondary | ICD-10-CM

## 2013-04-25 DIAGNOSIS — R5381 Other malaise: Secondary | ICD-10-CM

## 2013-04-25 DIAGNOSIS — Z5189 Encounter for other specified aftercare: Principal | ICD-10-CM

## 2013-04-25 DIAGNOSIS — E7800 Pure hypercholesterolemia, unspecified: Secondary | ICD-10-CM

## 2013-04-25 DIAGNOSIS — I6350 Cerebral infarction due to unspecified occlusion or stenosis of unspecified cerebral artery: Secondary | ICD-10-CM

## 2013-04-25 DIAGNOSIS — Z79899 Other long term (current) drug therapy: Secondary | ICD-10-CM

## 2013-04-25 DIAGNOSIS — Z833 Family history of diabetes mellitus: Secondary | ICD-10-CM

## 2013-04-25 DIAGNOSIS — H538 Other visual disturbances: Secondary | ICD-10-CM

## 2013-04-25 DIAGNOSIS — N19 Unspecified kidney failure: Secondary | ICD-10-CM

## 2013-04-25 DIAGNOSIS — N189 Chronic kidney disease, unspecified: Secondary | ICD-10-CM

## 2013-04-25 DIAGNOSIS — E1149 Type 2 diabetes mellitus with other diabetic neurological complication: Secondary | ICD-10-CM

## 2013-04-25 DIAGNOSIS — I671 Cerebral aneurysm, nonruptured: Secondary | ICD-10-CM

## 2013-04-25 DIAGNOSIS — R471 Dysarthria and anarthria: Secondary | ICD-10-CM

## 2013-04-25 DIAGNOSIS — N400 Enlarged prostate without lower urinary tract symptoms: Secondary | ICD-10-CM

## 2013-04-25 DIAGNOSIS — I776 Arteritis, unspecified: Secondary | ICD-10-CM

## 2013-04-25 DIAGNOSIS — Z8249 Family history of ischemic heart disease and other diseases of the circulatory system: Secondary | ICD-10-CM

## 2013-04-25 DIAGNOSIS — I129 Hypertensive chronic kidney disease with stage 1 through stage 4 chronic kidney disease, or unspecified chronic kidney disease: Secondary | ICD-10-CM

## 2013-04-25 DIAGNOSIS — E1142 Type 2 diabetes mellitus with diabetic polyneuropathy: Secondary | ICD-10-CM

## 2013-04-25 LAB — CBC
MCHC: 34.4 g/dL (ref 30.0–36.0)
MCV: 83.2 fL (ref 78.0–100.0)
Platelets: 138 10*3/uL — ABNORMAL LOW (ref 150–400)
RDW: 15.2 % (ref 11.5–15.5)
WBC: 3.4 10*3/uL — ABNORMAL LOW (ref 4.0–10.5)

## 2013-04-25 LAB — CREATININE, SERUM: GFR calc Af Amer: 64 mL/min — ABNORMAL LOW (ref >90)

## 2013-04-25 LAB — GLUCOSE, CAPILLARY: Glucose-Capillary: 110 mg/dL — ABNORMAL HIGH (ref 70–99)

## 2013-04-25 MED ORDER — ACETAMINOPHEN 325 MG PO TABS: 325.0000 mg | ORAL_TABLET | ORAL | Status: DC | PRN

## 2013-04-25 MED ORDER — CARVEDILOL 12.5 MG PO TABS
12.5000 mg | ORAL_TABLET | Freq: Two times a day (BID) | ORAL | Status: DC
  Administered 2013-04-26 – 2013-05-02 (×13): 12.5 mg via ORAL
  Filled 2013-04-25 (×15): qty 1

## 2013-04-25 MED ORDER — CYCLOPHOSPHAMIDE 50 MG PO TABS
50.0000 mg | ORAL_TABLET | Freq: Every day | ORAL | Status: DC
  Administered 2013-04-26 – 2013-05-02 (×7): 50 mg via ORAL
  Filled 2013-04-25 (×8): qty 1

## 2013-04-25 MED ORDER — AZATHIOPRINE 50 MG PO TABS
100.0000 mg | ORAL_TABLET | Freq: Every day | ORAL | Status: DC
  Administered 2013-04-26 – 2013-05-02 (×7): 100 mg via ORAL
  Filled 2013-04-25 (×8): qty 2

## 2013-04-25 MED ORDER — FUROSEMIDE 40 MG PO TABS
40.0000 mg | ORAL_TABLET | Freq: Every day | ORAL | Status: DC
  Administered 2013-04-26 – 2013-05-02 (×7): 40 mg via ORAL
  Filled 2013-04-25 (×9): qty 1

## 2013-04-25 MED ORDER — CLOPIDOGREL BISULFATE 75 MG PO TABS: 75.0000 mg | ORAL_TABLET | Freq: Every day | ORAL | Status: DC

## 2013-04-25 MED ORDER — ONDANSETRON HCL 4 MG PO TABS: 4.0000 mg | ORAL_TABLET | Freq: Four times a day (QID) | ORAL | Status: DC | PRN

## 2013-04-25 MED ORDER — VITAMIN D (ERGOCALCIFEROL) 1.25 MG (50000 UNIT) PO CAPS
50000.0000 [IU] | ORAL_CAPSULE | ORAL | Status: DC
  Administered 2013-04-28: 50000 [IU] via ORAL
  Filled 2013-04-25: qty 1

## 2013-04-25 MED ORDER — HEPARIN SODIUM (PORCINE) 5000 UNIT/ML IJ SOLN
5000.0000 [IU] | Freq: Three times a day (TID) | INTRAMUSCULAR | Status: DC
  Administered 2013-04-25 – 2013-05-01 (×16): 5000 [IU] via SUBCUTANEOUS
  Filled 2013-04-25 (×20): qty 1

## 2013-04-25 MED ORDER — HYDRALAZINE HCL 50 MG PO TABS
50.0000 mg | ORAL_TABLET | Freq: Two times a day (BID) | ORAL | Status: DC
  Administered 2013-04-25 – 2013-05-02 (×14): 50 mg via ORAL
  Filled 2013-04-25 (×16): qty 1

## 2013-04-25 MED ORDER — LACTINEX PO CHEW
1.0000 | CHEWABLE_TABLET | Freq: Three times a day (TID) | ORAL | Status: DC
  Administered 2013-04-25 – 2013-05-02 (×20): 1 via ORAL
  Filled 2013-04-25 (×26): qty 1

## 2013-04-25 MED ORDER — HEPARIN SODIUM (PORCINE) 5000 UNIT/ML IJ SOLN
5000.0000 [IU] | Freq: Three times a day (TID) | INTRAMUSCULAR | Status: DC
  Administered 2013-04-26: 5000 [IU] via SUBCUTANEOUS
  Filled 2013-04-25 (×8): qty 1

## 2013-04-25 MED ORDER — CLOPIDOGREL BISULFATE 75 MG PO TABS
75.0000 mg | ORAL_TABLET | Freq: Every day | ORAL | Status: DC
  Administered 2013-04-26 – 2013-05-02 (×7): 75 mg via ORAL
  Filled 2013-04-25 (×8): qty 1

## 2013-04-25 MED ORDER — COLCHICINE 0.6 MG PO TABS
0.6000 mg | ORAL_TABLET | Freq: Two times a day (BID) | ORAL | Status: DC | PRN
  Filled 2013-04-25: qty 1

## 2013-04-25 MED ORDER — DUTASTERIDE 0.5 MG PO CAPS
0.5000 mg | ORAL_CAPSULE | Freq: Every day | ORAL | Status: DC
  Administered 2013-04-26 – 2013-05-02 (×7): 0.5 mg via ORAL
  Filled 2013-04-25 (×8): qty 1

## 2013-04-25 MED ORDER — FUROSEMIDE 40 MG PO TABS: 40.0000 mg | ORAL_TABLET | Freq: Every day | ORAL | Status: DC

## 2013-04-25 MED ORDER — INSULIN ASPART 100 UNIT/ML ~~LOC~~ SOLN
0.0000 [IU] | Freq: Three times a day (TID) | SUBCUTANEOUS | Status: DC
  Administered 2013-04-30: 2 [IU] via SUBCUTANEOUS

## 2013-04-25 MED ORDER — HYDRALAZINE HCL 100 MG PO TABS: 50.0000 mg | ORAL_TABLET | Freq: Two times a day (BID) | ORAL | Status: DC

## 2013-04-25 MED ORDER — DICLOFENAC SODIUM 1 % TD GEL
4.0000 g | Freq: Two times a day (BID) | TRANSDERMAL | Status: DC
  Administered 2013-04-25 – 2013-05-02 (×14): 4 g via TOPICAL
  Filled 2013-04-25: qty 100

## 2013-04-25 MED ORDER — ONDANSETRON HCL 4 MG/2ML IJ SOLN: 4.0000 mg | Freq: Four times a day (QID) | INTRAMUSCULAR | Status: DC | PRN

## 2013-04-25 MED ORDER — POTASSIUM CHLORIDE CRYS ER 20 MEQ PO TBCR
40.0000 meq | EXTENDED_RELEASE_TABLET | Freq: Every day | ORAL | Status: DC
  Administered 2013-04-25 – 2013-05-02 (×8): 40 meq via ORAL
  Filled 2013-04-25 (×8): qty 2

## 2013-04-25 MED ORDER — ATORVASTATIN CALCIUM 20 MG PO TABS
20.0000 mg | ORAL_TABLET | Freq: Every day | ORAL | Status: DC
  Administered 2013-04-26 – 2013-05-01 (×6): 20 mg via ORAL
  Filled 2013-04-25 (×7): qty 1

## 2013-04-25 MED ORDER — SORBITOL 70 % SOLN
30.0000 mL | Freq: Every day | Status: DC | PRN
  Filled 2013-04-25: qty 30

## 2013-04-25 MED ORDER — TAMSULOSIN HCL 0.4 MG PO CAPS
0.4000 mg | ORAL_CAPSULE | Freq: Every day | ORAL | Status: DC
  Administered 2013-04-25 – 2013-05-01 (×7): 0.4 mg via ORAL
  Filled 2013-04-25 (×8): qty 1

## 2013-04-25 NOTE — Plan of Care (Signed)
Overall Plan of Care [facility]) Patient Details Name: Ryan Jimenez MRN: [id number] DOB: 14-Jul-1932  Diagnosis:  Brainstem cva  Co-morbidities: vasculitis, dm, htn  Functional Problem List  Patient demonstrates impairments in the following areas: Balance, Bladder, Bowel, Medication Management, Motor, Pain, Safety, Skin Integrity and Vision  Basic ADL's: grooming, bathing, dressing and toileting Advanced ADL's: n/a  Transfers:  bed mobility, bed to chair and car Locomotion:  ambulation and stairs  Additional Impairments:  Leisure Awareness  Anticipated Outcomes Item Anticipated Outcome  Eating/Swallowing    Basic self-care  Mod I   Tolieting  Mod I  Bowel/Bladder  Min assist to Mod-I  Transfers  Mod I  Locomotion  Mod I with supervision for stairs  Communication    Cognition    Pain   pain < 3  Safety/Judgment    Other     Therapy Plan: PT Intensity: Minimum of 1-2 x/day ,45 to 90 minutes PT Frequency: 5 out of 7 days PT Duration Estimated Length of Stay: 1 week OT Intensity: Minimum of 1-2 x/day, 45 to 90 minutes OT Frequency: 5 out of 7 days OT Duration/Estimated Length of Stay: 5-7 days      Team Interventions: Item RN PT OT SLP SW TR Other  Self Care/Advanced ADL Retraining   x      Neuromuscular Re-Education   x      Therapeutic Activities  x x      UE/LE Strength Training/ROM  x x      UE/LE Coordination Activities  x x      Visual/Perceptual Remediation/Compensation   x      DME/Adaptive Equipment Instruction  x x      Therapeutic Exercise  x x      Balance/Vestibular Training  x x      Patient/Family Education X x x      Cognitive Remediation/Compensation         Functional Mobility Training  x x      Ambulation/Gait Training  x       Visual merchandiser Reintegration   x      Dysphagia/[employer] consultant         Bladder Management X        Bowel Management X        Disease Management/Prevention X x x      Pain Management X        Medication Management X        Skin Care/Wound Management X        Splinting/Orthotics         Discharge Planning  x x      Psychosocial Support   x                         Team Discharge Planning: Destination: PT-Home ,OT- Home , SLP-  Projected Follow-up: PT-Outpatient PT, OT-   , SLP-  Projected Equipment Needs: PT-None recommended by PT (patient has needed equipment), OT-  , SLP-  Patient/family involved in discharge planning: PT- Patient,  OT-Patient, SLP-   MD ELOS: one week Medical Rehab Prognosis:  Excellent Assessment: The patient has been admitted for CIR therapies. The team will be addressing, functional mobility, strength, stamina, balance,  safety, adaptive techniques/equipment, self-care, bowel and bladder mgt, patient and caregiver education, vestibular assessment and sx mgt. Goals have been set at Ryan Lope, MD, [facility]      See Team Conference Notes for weekly updates to the plan of care

## 2013-04-25 NOTE — Progress Notes (Signed)
Stroke Team Progress Note  HISTORY Ryan Jimenez is an 79 y.o. male who reports that he went to bed normal last evening 04/22/2013 feeling at his baseline. He awakened at about 3am to use the bathroom and was dizzy. He remained dizzy throughout the day particularly when he sat or stood up. His wife also noted that his speech was slurred. Once in the ED 04/23/2013 the patient felt hat he noted some left sided weakness and decreased sensation on the left. He has remained ambulatory. Patient has had some accompanying nausea and vomiting today. Reports that his appetite has been poor for the past week and he has eaten very little. Patient was not a TPA candidate secondary to being  Outside time window. He was admitted for further evaluation and treatment.  SUBJECTIVE No family at bedside. Pt will need rehab at discharge.  OBJECTIVE Most recent Vital Signs: Filed Vitals:   04/24/13 2213 04/25/13 0200 04/25/13 0509 04/25/13 1019  BP: 153/77 116/66 150/75 157/75  Pulse: 73 67 65 66  Temp: 99.2 F (37.3 C) 98.7 F (37.1 C) 98.6 F (37 C) 98.4 F (36.9 C)  TempSrc: Oral Oral Oral Oral  Resp: 18 16 15 18   Height:      Weight:      SpO2: 100% 100% 100% 100%   CBG (last 3)   Recent Labs  04/24/13 1625 04/24/13 2220 04/25/13 0652  GLUCAP 102* 121* 110*   IV Fluid Intake:   . sodium chloride 20 mL/hr at 04/23/13 0313  . sodium chloride 75 mL/hr at 04/25/13 1014   MEDICATIONS  . atorvastatin  20 mg Oral q1800  . azaTHIOprine  100 mg Oral Daily  . clopidogrel  75 mg Oral Q breakfast  . cyclophosphamide  50 mg Oral QAC breakfast  . diclofenac sodium  4 g Topical BID  . dutasteride  0.5 mg Oral Daily  . heparin  5,000 Units Subcutaneous Q8H  . insulin aspart  0-9 Units Subcutaneous TID WC  . lactobacillus acidophilus & bulgar  1 tablet Oral TID  . potassium chloride  40 mEq Oral BID  . tamsulosin  0.4 mg Oral QHS  . [START ON 04/28/2013] Vitamin D (Ergocalciferol)  50,000 Units Oral Q  Sun   PRN:  colchicine, ondansetron, senna-docusate  Diet:  Carb Control thin liquids Activity:  OOB with assistance DVT Prophylaxis:  Heparin 5000 units sq tid   CLINICALLY SIGNIFICANT STUDIES Basic Metabolic Panel:   Recent Labs Lab 04/23/13 0450 04/23/13 1350 04/23/13 1351 04/24/13 1145  NA 140  --   --  140  K 2.9* 3.2*  --  3.9  CL 100  --   --  103  CO2 31  --   --  29  GLUCOSE 182*  --   --  113*  BUN 14  --   --  12  CREATININE 1.28  --   --  1.21  CALCIUM 9.0  --   --  9.1  MG  --   --  2.1 2.0   Liver Function Tests:   Recent Labs Lab 04/22/13 2230 04/23/13 0450  AST 21 18  ALT 11 11  ALKPHOS 47 41  BILITOT 0.4 0.3  PROT 7.3 6.7  ALBUMIN 3.9 3.4*   CBC:   Recent Labs Lab 04/22/13 2230 04/23/13 0450  WBC 3.4* 3.2*  NEUTROABS 2.4 1.9  HGB 12.1* 11.0*  HCT 35.5* 32.5*  MCV 84.7 85.3  PLT 145* 138*   Coagulation:  Recent Labs Lab 04/22/13 2230  LABPROT 13.6  INR 1.05   Cardiac Enzymes:   Recent Labs Lab 04/22/13 2230  TROPONINI <0.30   Urinalysis:   Recent Labs Lab 04/22/13 2301  COLORURINE YELLOW  LABSPEC 1.015  PHURINE 7.0  GLUCOSEU NEGATIVE  HGBUR NEGATIVE  BILIRUBINUR NEGATIVE  KETONESUR NEGATIVE  PROTEINUR 30*  UROBILINOGEN 0.2  NITRITE NEGATIVE  LEUKOCYTESUR NEGATIVE   Lipid Panel    Component Value Date/Time   CHOL 117 04/23/2013 0500   TRIG 101 04/23/2013 0500   HDL 57 04/23/2013 0500   CHOLHDL 2.1 04/23/2013 0500   VLDL 20 04/23/2013 0500   LDLCALC 40 04/23/2013 0500   HgbA1C  Lab Results  Component Value Date   HGBA1C 6.6* 04/23/2013    Urine Drug Screen:   No results found for this basename: labopia,  cocainscrnur,  labbenz,  amphetmu,  thcu,  labbarb    Alcohol Level: No results found for this basename: ETH,  in the last 168 hours  CT of the brain  04/22/2013  No acute findings.  Stable atrophy.   MRI of the brain  04/23/2013 No acute infarct.  Moderate small vessel disease type changes.  Global  atrophy without hydrocephalus.   MRA of the brain 04/23/2013    Mild irregularity with regions of narrowing and ectasia involving the cavernous segment of the internal carotid artery bilaterally. 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  Posterior circulation branch vessel irregularity   MRA Neck 04/23/2013   54% diameter stenosis proximal right internal carotid artery.  No significant stenosis or irregularity of the left carotid bifurcation.  The right subclavian artery is not completely included present examination.  Mild to moderate narrowing and irregularity left subclavian artery.  Mild to slightly moderate narrowing proximal vertebral artery bilaterally.  The present exam may overestimate the degree of stenosis in this region.    2D Echocardiogram  EF 50-55% with no source of embolus.   Carotid Doppler  See MRA neck  CXR  04/23/2013   No active disease.     EKG  normal sinus rhythm, RBBB.   Therapy Recommendations CIR  Physical Exam   Awake alert. Afebrile. Head is nontraumatic. Neck is supple without bruit. Hearing is normal. Cardiac exam no murmur or gallop. Lungs are clear to auscultation. Distal pulses are well felt. Neurological exam : Awake alert oriented x3 with normal speech and language function. Fundi were not visualized. Vision acuity and fields are adequate. Eye movements are full range but there is nystagmus on left lateral gaze. Face is symmetric without weakness. Tongue is midline. Motor system exam reveals no upper or lower extremity drift and symmetric strength. Slightly diminished finger to nose coordination on the left. Gait was not tested. ASSESSMENT [name] is a 79 y.o. male presenting with dizziness and slurred speech, who developed significant left nystagmus. Dx: small right brainstem infarct not seen on MRI. Infarct felt to be thrombotic secondary to small vessel disease.   On aspirin 81 mg orally every day prior to admission. Now on clopidogrel 75 mg orally every day for secondary stroke prevention. Patient with resultant left nystagumus, mild left hemiparesis, dizziness, dysarthria. Nausea and vomiting has resolved. Work up underway.   Hypertension  Diabetes, HgbA1c 6.6 goal < 7.0  Hyperlipidemia, LDL 40, on crestor 10 PTA, now on crestor 10 mg daily, goal LDL < 100 (< 70 for diabetics)   Hospital  day # 3  TREATMENT/PLAN  Continue clopidogrel 75 mg orally every day for secondary stroke prevention.   Rehab at discharge - medically ready  Ongoing risk factor control No further stroke workup indicated. Patient has a 10-15% risk of having another stroke over the next year, the highest risk is within 2 weeks of the most recent stroke/TIA (risk of having a stroke following a stroke or TIA is the same). Ongoing risk factor control by Primary Care Physician Stroke Service will sign off. Please call should any needs arise. Follow up with Dr. Leonie Man, [facility], in 2 months.  Burnetta Sabin, MSN, RN, ANVP-BC, ANP-BC, [facility] Pager: [phone] 04/25/2013 10:47 AM  I have personally obtained a history, examined the patient, evaluated imaging results, and formulated the assessment and plan of care. I agree with the above.  Antony Contras, MD

## 2013-04-25 NOTE — Progress Notes (Signed)
Txs recommend CIR.  Pt agreeable to CIR. Can admit today if medically ready. [phone]

## 2013-04-25 NOTE — Progress Notes (Signed)
CSW received referral for SNF placement. CSW will meet with the pt and/or family to search for placement. Full assessment to follow.  Danise Edge, [place] Social Worker [phone]

## 2013-04-25 NOTE — Progress Notes (Signed)
Clinical Social Work Department CLINICAL SOCIAL WORK PLACEMENT NOTE 04/25/2013  Patient:  Ryan Jimenez, Ryan Jimenez  Account Number:  [id number] Admit date:  04/22/2013  Clinical Social Worker:  Danise Edge, Latanya Presser  Date/time:  04/25/2013 12:00 N  Clinical Social Work is seeking post-discharge placement for this patient at the following level of care:   SKILLED NURSING   (*CSW will update this form in Epic as items are completed)   04/25/2013  Patient/family provided with [place] Department of Clinical Social Work's list of facilities offering this level of care within the geographic area requested by the patient (or if unable, by the patient's family).  04/25/2013  Patient/family informed of their freedom to choose among providers that offer the needed level of care, that participate in Medicare, Medicaid or managed care program needed by the patient, have an available bed and are willing to accept the patient.  04/25/2013  Patient/family informed of [facility]' ownership interest in [facility], as well as of the fact that they are under no obligation to receive care at this facility.  PASARR submitted to EDS on  PASARR number received from [place] on   FL2 transmitted to all facilities in geographic area requested by pt/family on  04/25/2013 FL2 transmitted to all facilities within larger geographic area on 04/25/2013  Patient informed that his/her managed care company has contracts with or will negotiate with  certain facilities, including the following:     Patient/family informed of bed offers received:   Patient chooses bed at  Physician recommends and patient chooses bed at    Patient to be transferred to  on   Patient to be transferred to facility by   The following physician request were entered in Epic:   Additional Comments: Danise Edge, [place] Worker [phone]

## 2013-04-25 NOTE — PMR Pre-admission (Signed)
PMR Admission Coordinator Pre-Admission Assessment  Patient: Ryan Jimenez is an 79 y.o., male MRN: [id number] DOB: 12-09-1931 Height: 5\' 9"  (175.3 cm) Weight: 98.8 kg (217 lb 13 oz)              Insurance Information HMO:      PPO:       PCP:       IPA:       80/20: yes     OTHER:   PRIMARY: Medicare      Policy#: [id number]      Subscriber: pt        Employer: retired Benefits:       Name: Economist. Date: 05-15-1999     Deduct: $1216.00/year      Out of Pocket Max: 0      Life Max: 0 CIR: 100%      SNF: 100% days 1-20; $152/day days 21-100 Outpatient: 80%     Co-Pay: 20% Home Health: 100%      Co-Pay: 0 DME: 80%     Co-Pay: 20% Providers: pt's choice SECONDARY: [employer]      Policy#: [id number]      Subscriber: pt    Emergency Contact Information Contact Information   Name Relation Home Work Crosby Spouse [phone]  [phone]   Jessiah, Hipple Daughter [phone]  [phone]   Parker,Gail Daughter [phone]     Johnson,Siobhan Relative [phone]       Current Medical History  Patient Admitting Diagnosis:  BPPV vs small brainstem cva  History of Present Illness:  79 y.o. 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 Admitted 04/23/2013 with dizziness and slurred speech. MRI of the brain negative for acute infarct. 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 MRA of the neck with 54% diameter stenosis proximal right ICA. Echocardiogram with ejection fraction [id number] grade 1 diastolic dysfunction. Patient did not receive TPA. Neurology services consulted workup ongoing presently on Plavix therapy for CVA prophylaxis as well as subcutaneous heparin for DVT prophylaxis. Physical and  occupational therapy recommend CIR.   MD has requested physical medicine rehabilitation consult to consider inpatient rehabilitation services.   Total: 5    Past Medical History  Past Medical History  Diagnosis Date  . Hypertension   . Diabetes mellitus   . Hypercholesterolemia   . Gout   . Shortness of breath on exertion   . Bronchitis   . Arthritis   . Near syncope 10/2011; 11/2011  . Renal disorder   . CKD (chronic kidney disease)   . Glomerulonephritis   . Pancytopenia   . Anemia     Family History  family history includes Diabetes in his sister; Heart disease in his mother; and Hypertension in his mother.  Prior Rehab/Hospitalizations:    Current Medications  Current facility-administered medications:0.9 %  sodium chloride infusion, , Intravenous, Continuous, Rise Patience, MD, Last Rate: 20 mL/hr at 04/23/13 0313;  0.9 %  sodium chloride infusion, , Intravenous, Continuous, Ripudeep K Rai, MD, Last Rate: 75 mL/hr at 04/25/13 1014;  atorvastatin (LIPITOR) tablet 20 mg, 20 mg, Oral, q1800, Rise Patience, MD, 20 mg at 04/24/13 1717 azaTHIOprine (IMURAN) tablet 100 mg, 100 mg, Oral, Daily, Rise Patience, MD, 100 mg at 04/25/13 1013;  clopidogrel (PLAVIX) tablet 75 mg,  75 mg, Oral, Q breakfast, Rise Patience, MD, 75 mg at 04/25/13 [id number];  colchicine tablet 0.6 mg, 0.6 mg, Oral, BID PRN, Rise Patience, MD;  cyclophosphamide (CYTOXAN) tablet 50 mg, 50 mg, Oral, QAC breakfast, Rise Patience, MD, 50 mg at 04/25/13 [id number] diclofenac sodium (VOLTAREN) 1 % transdermal gel 4 g, 4 g, Topical, BID, Rise Patience, MD, 4 g at 04/25/13 1014;  dutasteride (AVODART) capsule 0.5 mg, 0.5 mg, Oral, Daily, Rise Patience, MD, 0.5 mg at 04/25/13 1014;  heparin injection 5,000 Units, 5,000 Units, Subcutaneous, Q8H, Rise Patience, MD, 5,000 Units at 04/25/13 1407 insulin aspart (novoLOG) injection 0-9 Units, 0-9 Units, Subcutaneous, TID WC, Rise Patience, MD, 1 Units at 04/24/13 1258;  lactobacillus acidophilus & bulgar (LACTINEX) chewable tablet 1 tablet, 1 tablet, Oral, TID, Rise Patience, MD, 1 tablet at 04/25/13 1013;  ondansetron (ZOFRAN) injection 4 mg, 4 mg, Intravenous, Q6H PRN, Ripudeep K Rai, MD, 4 mg at 04/23/13 0803 potassium chloride SA (K-DUR,KLOR-CON) CR tablet 40 mEq, 40 mEq, Oral, BID, Reyne Dumas, MD, 40 mEq at 04/25/13 1014;  senna-docusate (Senokot-S) tablet 1 tablet, 1 tablet, Oral, QHS PRN, Rise Patience, MD;  tamsulosin [facility]) capsule 0.4 mg, 0.4 mg, Oral, QHS, Rise Patience, MD, 0.4 mg at 04/24/13 2147;  [START ON 04/28/2013] Vitamin D (Ergocalciferol) (DRISDOL) capsule 50,000 Units, 50,000 Units, Oral, Q Sun, Rise Patience, MD  Patients Current Diet: Carb Control  Precautions / Restrictions Precautions Precautions: Fall Restrictions Weight Bearing Restrictions: No   Prior Activity Level  Mod I w/ cane Home Assistive Devices / Equipment Home Assistive Devices/Equipment: Cane (specify quad or straight);Walker (specify type);CBG Meter Home Adaptive Equipment: Straight cane;Walker - rolling;Bedside commode/3-in-1;Tub transfer bench  Prior Functional Level Prior Function Level of Independence: Independent with assistive device(s) (used cane) Able to Take Stairs?: Yes Driving: Yes Vocation: Retired  Current Functional Level Cognition  Arousal/Alertness: Awake/alert Overall Cognitive Status: Within Functional Limits for tasks assessed Orientation Level: Oriented X4 Attention: Sustained Sustained Attention: Impaired Sustained Attention Impairment: Verbal basic (lethargy) Memory:  (to be assessed further) Awareness:  (intellectual intact, will assess further) Problem Solving: Appears intact Safety/Judgment: Appears intact    Extremity Assessment (includes Sensation/Coordination)  RUE ROM/Strength/Tone: Deficits RUE ROM/Strength/Tone Deficits: 4/5 RUE Sensation: WFL - Light  Touch RUE Coordination: WFL - gross/fine motor  RLE ROM/Strength/Tone: WFL for tasks assessed    ADLs  Eating/Feeding: Independent Where Assessed - Eating/Feeding: Chair Grooming: Wash/dry hands;Wash/dry face;Teeth care;Supervision/safety Where Assessed - Grooming: Unsupported sitting Upper Body Bathing: Supervision/safety Where Assessed - Upper Body Bathing: Unsupported sitting Lower Body Bathing: Minimal assistance Where Assessed - Lower Body Bathing: Supported sit to stand Upper Body Dressing: Supervision/safety Where Assessed - Upper Body Dressing: Unsupported sitting Lower Body Dressing: Minimal assistance Where Assessed - Lower Body Dressing: Supported sit to Lobbyist: Moderate assistance Toilet Transfer Method: Sit to stand;Stand pivot Toilet Transfer Equipment: Comfort height toilet Toileting - Clothing Manipulation and Hygiene: Minimal assistance Where Assessed - Toileting Clothing Manipulation and Hygiene: Standing Transfers/Ambulation Related to ADLs: min A ADL Comments: Pt initially moved to EOB with supervision, but unable to sustain due to dizziness - significant nystagmus noted with dizziness 7-8/10.  Pt returned to supine.  An "A" was taped to bathroom door with pt intstruction to stabilze gaze on object with transitioning.  Second attempt to transition supine to sit was successful with dizziness 5/10.  Pt performed LB ADLs with dizziness 6-7/10 and long rest break.  Pt  transferred to recliner with min A, and was intstructed in saccadic exercises and gaze stabilization - was able to return demonstration    Mobility  Bed Mobility: Supine to Sit Rolling Right: 5: Supervision Right Sidelying to Sit: 4: Min assist;With rails Supine to Sit: 4: Min guard Sitting - Scoot to [employer] of Bed: 4: Min guard Sit to Supine: 5: Supervision;HOB flat    Transfers  Transfers: Sit to Stand;Stand to Sit Sit to Stand: 4: Min assist;With upper extremity assist;From bed Stand  to Sit: 4: Min assist;With upper extremity assist;To chair/3-in-1    Ambulation / Gait / Stairs / Emergency planning/management officer  Ambulation/Gait Ambulation/Gait Assistance: 4: Min Wellsite geologist (Feet): 90 Feet Assistive device: Rolling walker Ambulation/Gait Assistance Details: Cues throughout for eyes on stationary target due to dizziness and spacial orientation difficulty with broad based gait and difficulty on turns with increased trunk flexion effective to lower center of gravity due to imbalance.  One episode running into object on left moving through door frame with assist for negotiating around. Gait Pattern: Step-through pattern;Trunk flexed;Wide base of support;Ataxic Stairs: No Wheelchair Mobility Wheelchair Mobility: No    Posture / Event organiser Standing - Balance Support: Left upper extremity supported;During functional activity Static Standing - Level of Assistance: 4: Min assist Static Standing - Comment/# of Minutes: 3 minutes needed steadying assist as pt dizzy. Dynamic Standing Balance Dynamic Standing - Balance Support: No upper extremity supported;During functional activity Dynamic Standing - Level of Assistance: 4: Min assist Dynamic Standing - Balance Activities: Other (comment) (donning belt) Dynamic Standing - Comments: standing approx 3 minutes washing perineal area due to bladder incontinence.  Again crouches down to lower center of gravity and braces legs against bed    Special needs/care consideration  Skin: no problems noted Bowel mgmt: LBM 04/22/13 Bladder mgmt:  Incontinent today per PT Diabetic mgmt   yes     Previous Home Environment Living Arrangements: Spouse/significant other Lives With: Spouse Available Help at Discharge: Available 24 hours/day Type of Home: House Home Layout: One level Home Access: Stairs to enter Entrance Stairs-Rails: None Entrance Stairs-Number of Steps: 2 Bathroom Shower/Tub: Scientist, forensic: Standard Home Care Services: No Additional Comments: Pt splits time between [place] and [place]  [his 2 daughters are in [place]  Discharge Living Setting Plans for Discharge Living Setting: Patient's home Do you have any problems obtaining your medications?: No  Social/Family/Support Systems Patient Roles: Spouse;Parent Contact Information: home# [phone] Anticipated Caregiver: wife, Wells Guiles Anticipated Caregiver's Contact Information: wife's cell ph# [phone] Ability/Limitations of Caregiver: wife has some medical issues of her own, hopes for S-Mod I  Caregiver Availability: 24/7 Discharge Plan Discussed with Primary Caregiver: Yes Is Caregiver In Agreement with Plan?: Yes Does Caregiver/Family have Issues with Lodging/Transportation while Pt is in Rehab?: No    Goals/Additional Needs Patient/Family Goal for Rehab: S-Mod I Expected length of stay: 7-10 days Pt/Family Agrees to Admission and willing to participate: Yes Program Orientation Provided & Reviewed with Pt/Caregiver Including Roles  & Responsibilities: Yes   Decrease burden of Care through IP rehab admission: Specialzed equipment needs, Decrease number of caregivers and Patient/family education   Possible need for SNF placement upon discharge: hope to avoid   Patient Condition: This patient's condition remains as documented in the consult dated 04/24/13, in which the Rehabilitation Physician determined and documented that the patient's condition is appropriate for intensive rehabilitative care in an inpatient rehabilitation facility. Will admit to inpatient rehab today.  Preadmission  Screen Completed By:  Theora Master, 04/25/2013 3:01 PM ______________________________________________________________________   Discussed status with Dr. Letta Pate on 04/25/13 at 3pm and received telephone approval for admission today.  Admission Coordinator:  Theora Master, time 3pm/Date 04/25/13

## 2013-04-25 NOTE — Progress Notes (Signed)
Clinical Social Work Department BRIEF PSYCHOSOCIAL ASSESSMENT 04/25/2013  Patient:  Ryan Jimenez, Ryan Jimenez     Account Number:  [id number]     Admit date:  04/22/2013  Clinical Social Worker:  Awilda Metro  Date/Time:  04/25/2013 12:00 N  Referred by:  CSW  Date Referred:  04/25/2013 Referred for  SNF Placement   Other Referral:   Interview type:  Patient Other interview type:   CSW also spoke with the pt's wife at bedside.    PSYCHOSOCIAL DATA Living Status:  FAMILY Admitted from facility:   Level of care:   Primary support name:  Jevante Mccauslin Primary support relationship to patient:  SPOUSE Degree of support available:   Very good support from his family.    CURRENT CONCERNS Current Concerns  Post-Acute Placement   Other Concerns:   Pt's wife doesn't drive, so finding a SNF close to the pt's home is important. She needs to be able to get a ride to where she needs to go or catch public transportation to bring her to see her husband.    SOCIAL WORK ASSESSMENT / PLAN CSW met with the pt, his wife Wells Guiles and another family member in the pt's room. Pt was alert, oriented and answered questions appropriatelyThe pt stated that he would like to go home with [facility] if he could. However the pt's wife told her husband that she felt that he needed to go to a SNF for PT, OT and SLP, to assist him with getting stronger before he went home.   Assessment/plan status:   Other assessment/ plan:   Information/referral to community resources:   Left a list of SNf's in the pt's room.    PATIENT'S/FAMILY'S RESPONSE TO PLAN OF CARE: Pt's and family's first choice now is CIR, 2nd would be a SNF and then home.    Danise Edge, [place] Social Worker [phone]

## 2013-04-25 NOTE — Progress Notes (Signed)
Occupational Therapy Treatment Patient Details Name: Ryan Jimenez MRN: [id number] DOB: 1932-09-22 Today's Date: 04/25/2013 Time: [id number] OT Time Calculation (min): 52 min  OT Assessment / Plan / Recommendation Comments on Treatment Session  Pt with significant dizziness and nystagmus upon initial attempt to get OOB this am.  Dizziness 7-8/10, and pt unable to maintain seated position.  Pt was instructed to stabilize gaze on target and was successful with functional mobility and simple ADL tasks with dizziness 5-6/10.  Pt remains a high fall risk, and is very limited with activity he is able to perform due to dizziness and nystagmus.  Recommend CIR.    Follow Up Recommendations  CIR;Supervision/Assistance - 24 hour    Barriers to Discharge       Equipment Recommendations  Tub/shower bench    Recommendations for Other Services Rehab consult  Frequency Min 2X/week   Plan Discharge plan remains appropriate    Precautions / Restrictions Precautions Precautions: Fall   Pertinent Vitals/Pain     ADL  Lower Body Dressing: Minimal assistance Where Assessed - Lower Body Dressing: Supported sit to stand Transfers/Ambulation Related to ADLs: min A ADL Comments: Pt initially moved to EOB with supervision, but unable to sustain due to dizziness - significant nystagmus noted with dizziness 7-8/10.  Pt returned to supine.  An "A" was taped to bathroom door with pt intstruction to stabilze gaze on object with transitioning.  Second attempt to transition supine to sit was successful with dizziness 5/10.  Pt performed LB ADLs with dizziness 6-7/10 and long rest break.  Pt transferred to recliner with min A, and was intstructed in saccadic exercises and gaze stabilization - was able to return demonstration    OT Diagnosis:    OT Problem List:   OT Treatment Interventions:     OT Goals Acute Rehab OT Goals OT Goal Formulation: With patient/family Time For Goal Achievement:  05/08/13 Potential to Achieve Goals: Good ADL Goals Pt Will Perform Grooming: with supervision;Standing at sink Pt Will Perform Lower Body Bathing: with supervision;Sit to stand from chair;Sit to stand from bed Pt Will Perform Lower Body Dressing: with supervision;Sit to stand from chair;Sit to stand from bed ADL Goal: Lower Body Dressing - Progress: Progressing toward goals Pt Will Transfer to Toilet: with supervision;Ambulation;Comfort height toilet Pt Will Perform Toileting - Clothing Manipulation: with supervision;Standing Additional ADL Goal #1: Pt will be independent with HEP for vision ADL Goal: Additional Goal #1 - Progress: Progressing toward goals Additional ADL Goal #2: Pt will peform all BADLs with no complaint dizziness ADL Goal: Additional Goal #2 - Progress: Progressing toward goals  Visit Information  Last OT Received On: 04/25/13 Assistance Needed: +1    Subjective Data      Prior Functioning       Cognition  Cognition Arousal/Alertness: Awake/alert Behavior During Therapy: WFL for tasks assessed/performed Overall Cognitive Status: Within Functional Limits for tasks assessed    Mobility  Bed Mobility Bed Mobility: Supine to Sit Rolling Right: 5: Supervision Sit to Supine: 5: Supervision;HOB flat Details for Bed Mobility Assistance: see ADL comments - significant dizziness with initial attempt Transfers Transfers: Sit to Stand;Stand to Sit Sit to Stand: 4: Min assist;With upper extremity assist;From bed Stand to Sit: 4: Min assist;With upper extremity assist;To chair/3-in-1 Details for Transfer Assistance: Pt with broad base of support when standing and braces legs against bed    Exercises      Balance Dynamic Standing Balance Dynamic Standing - Balance Support: No upper extremity supported;During  functional activity Dynamic Standing - Level of Assistance: 4: Min assist Dynamic Standing - Comments: standing approx 3 minutes washing perineal area due to  bladder incontinence.  Again crouches down to lower center of gravity and braces legs against bed   End of Session OT - End of Session Activity Tolerance: Patient tolerated treatment well Patient left: in chair;with call bell/phone within reach  [name], Ryan Jimenez 04/25/2013, 1:10 PM

## 2013-04-25 NOTE — Discharge Summary (Addendum)
Physician Discharge Summary  Ryan AYSON MRN: [id number] DOB/AGE: 02/20/1932 79 y.o.  PCP: Philis Fendt, MD   Admit date: 04/22/2013 Discharge date: 04/25/2013  Discharge Diagnoses:      CVA (cerebral infarction) Active Problems:   Hypercholesterolemia   Hypertension   Renal failure   Type 2 diabetes mellitus    Medication List    STOP taking these medications       amLODipine 10 MG tablet  Commonly known as:  NORVASC     aspirin 81 MG EC tablet     ciprofloxacin 500 MG tablet  Commonly known as:  CIPRO      TAKE these medications       ACIDOPHILUS PROBIOTIC 100 MG Caps  Take 1 capsule (100 mg total) by mouth 3 (three) times daily.     azaTHIOprine 50 MG tablet  Commonly known as:  IMURAN  Take 100 mg by mouth daily.     carvedilol 12.5 MG tablet  Commonly known as:  COREG  Take 12.5 mg by mouth 2 (two) times daily with a meal.     clopidogrel 75 MG tablet  Commonly known as:  PLAVIX  Take 1 tablet (75 mg total) by mouth daily with breakfast.     colchicine 0.6 MG tablet  Take 0.6 mg by mouth 2 (two) times daily as needed (for gout flareups).     cyclophosphamide 50 MG tablet  Commonly known as:  CYTOXAN  Take 50 mg by mouth daily. Give on an empty stomach 1 hour before or 2 hours after meals.     diclofenac sodium 1 % Gel  Commonly known as:  VOLTAREN  Apply 4 g topically 2 (two) times daily.     dutasteride 0.5 MG capsule  Commonly known as:  AVODART  Take 0.5 mg by mouth daily.     EYE DROPS ADVANCED RELIEF OP  Place 1 drop into both eyes daily.     furosemide 40 MG tablet  Commonly known as:  LASIX  Take 1 tablet (40 mg total) by mouth daily.     hydrALAZINE 100 MG tablet  Commonly known as:  APRESOLINE  Take 0.5 tablets (50 mg total) by mouth 2 (two) times daily.     NOVOLOG FLEXPEN 100 UNIT/ML injection  Generic drug:  insulin aspart  Inject 2-10 Units into the skin 2 (two) times daily before a meal. Per SSI      potassium chloride SA 20 MEQ tablet  Commonly known as:  K-DUR,KLOR-CON  Take 20 mEq by mouth daily.     rosuvastatin 10 MG tablet  Commonly known as:  CRESTOR  Take 10 mg by mouth at bedtime.     tamsulosin 0.4 MG Caps  Commonly known as:  FLOMAX  Take 0.4 mg by mouth at bedtime.     Vitamin D (Ergocalciferol) 50000 UNITS Caps  Commonly known as:  DRISDOL  Take 50,000 Units by mouth every 7 (seven) days. Take on sundays       Discharge Condition: Stable  Disposition: 01-Home or Self Care   Consults:  Neurology Physical medicine and rehabilitation    Significant Diagnostic Studies: Dg Chest 2 View  04/23/2013   *RADIOLOGY REPORT*  Clinical Data: Stroke.  CHEST - 2 VIEW  Comparison: 04/04/2012  Findings: Low lung volumes are seen however both lungs are clear. Heart size within normal limits. No evidence of pleural effusion. No mass or lymphadenopathy identified.  IMPRESSION: No active disease.   Original Report  Authenticated By: Earle Gell, M.D.   Ct Head Wo Contrast  04/22/2013   *RADIOLOGY REPORT*  Clinical Data: Dizziness and blurred vision.  CT HEAD WITHOUT CONTRAST  Technique:  Contiguous axial images were obtained from the base of the skull through the vertex without contrast.  Comparison: 11/21/2011  Findings: Stable cortical atrophy. The brain demonstrates no evidence of hemorrhage, infarction, edema, mass effect, extra-axial fluid collection, hydrocephalus or mass lesion.  The skull is unremarkable.  IMPRESSION: No acute findings.  Stable atrophy.   Original Report Authenticated By: Aletta Edouard, M.D.   Mr Angiogram Neck W Wo Contrast  04/23/2013   *RADIOLOGY REPORT*  Clinical Data:  Diabetic hypertensive patient with hypercholesterolemia presenting with slurred speech and blurred vision with nausea and vomiting.  MRI BRAIN WITH AND WITHOUT CONTRAST MRA HEAD WITHOUT CONTRAST MRA NECK WITHOUT AND WITH CONTRAST  Technique: Multiplanar, multiecho pulse sequences of the  brain and surrounding structures were obtained according to standard protocol with and without intravenous contrast.  Angiographic images of the Circle of Willis were obtained using MRA technique without intravenous contrast.  Angiographic images of the neck were obtained using MRA technique without and with intravenous contrast.  Contrast:20 ml MultiHance.  Comparison:04/22/2013 CT.  11/23/2011 MR.  MRI HEAD  Findings: No acute infarct.  No intracranial hemorrhage.  Moderate small vessel disease type changes.  Global atrophy without hydrocephalus.  No intracranial mass or abnormal enhancement.  Cervical spondylotic changes with very mild spinal stenosis C2-3 and C3-4.  Cervical medullary junction, pituitary region, pineal region and orbital structures unremarkable.  Minimal mucosal thickening paranasal sinuses.  IMPRESSION: No acute infarct.  Moderate small vessel disease type changes.  Global atrophy without hydrocephalus.  MRA HEAD  Findings:Mild irregularity with regions of narrowing and ectasia involving the cavernous segment of the internal carotid artery bilaterally.  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  Beyond this region, anterior circulation without medium or large size vessel significant stenosis or occlusion.  Ectatic vertebral arteries and basilar artery.  Mild narrowing distal right vertebral artery.  Nonvisualization left PICA and right AICA.  Narrowed irregular right PICA and left AICA.  Superior cerebellar artery irregularity and narrowing more notable on the right.  Posterior cerebral artery.  mild branch vessel irregularity.  IMPRESSION: Mild irregularity with regions of narrowing and ectasia involving the cavernous segment of the internal carotid artery bilaterally. 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  Posterior  circulation branch vessel irregularity as noted above.  MRA NECK  Findings:Normal configuration of the origin of the great vessels from the aortic arch.  Ectatic right common carotid artery without significant narrowing.  Plaque right carotid bifurcation with 54% stenosis proximal right internal carotid artery.  Ectatic left common carotid artery without significant narrowing. No significant stenosis or irregularity of the left carotid bifurcation.  The right subclavian artery is not completely included present examination.  Mild to moderate narrowing and irregularity left subclavian artery.  Mild to slightly moderate narrowing proximal vertebral artery bilaterally.  The present exam may overestimate the degree of stenosis in this region.  Beyond this level, no significant stenosis of the vertebral artery.  IMPRESSION: 54% diameter stenosis proximal right internal carotid artery.  No significant stenosis or irregularity of the left carotid bifurcation.  The right subclavian artery is not completely included present examination.  Mild to moderate narrowing and irregularity  left subclavian artery.  Mild to slightly moderate narrowing proximal vertebral artery bilaterally.  The present exam may overestimate the degree of stenosis in this region.   Original Report Authenticated By: Genia Del, M.D.   Mr Jeri Cos Wo Contrast  04/23/2013   *RADIOLOGY REPORT*  Clinical Data:  Diabetic hypertensive patient with hypercholesterolemia presenting with slurred speech and blurred vision with nausea and vomiting.  MRI BRAIN WITH AND WITHOUT CONTRAST MRA HEAD WITHOUT CONTRAST MRA NECK WITHOUT AND WITH CONTRAST  Technique: Multiplanar, multiecho pulse sequences of the brain and surrounding structures were obtained according to standard protocol with and without intravenous contrast.  Angiographic images of the Circle of Willis were obtained using MRA technique without intravenous contrast.  Angiographic images of the neck were  obtained using MRA technique without and with intravenous contrast.  Contrast:20 ml MultiHance.  Comparison:04/22/2013 CT.  11/23/2011 MR.  MRI HEAD  Findings: No acute infarct.  No intracranial hemorrhage.  Moderate small vessel disease type changes.  Global atrophy without hydrocephalus.  No intracranial mass or abnormal enhancement.  Cervical spondylotic changes with very mild spinal stenosis C2-3 and C3-4.  Cervical medullary junction, pituitary region, pineal region and orbital structures unremarkable.  Minimal mucosal thickening paranasal sinuses.  IMPRESSION: No acute infarct.  Moderate small vessel disease type changes.  Global atrophy without hydrocephalus.  MRA HEAD  Findings:Mild irregularity with regions of narrowing and ectasia involving the cavernous segment of the internal carotid artery bilaterally.  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  Beyond this region, anterior circulation without medium or large size vessel significant stenosis or occlusion.  Ectatic vertebral arteries and basilar artery.  Mild narrowing distal right vertebral artery.  Nonvisualization left PICA and right AICA.  Narrowed irregular right PICA and left AICA.  Superior cerebellar artery irregularity and narrowing more notable on the right.  Posterior cerebral artery.  mild branch vessel irregularity.  IMPRESSION: Mild irregularity with regions of narrowing and ectasia involving the cavernous segment of the internal carotid artery bilaterally. 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  Posterior circulation branch vessel irregularity as noted above.  MRA NECK  Findings:Normal configuration of the origin of the great vessels from the aortic arch.  Ectatic right common carotid artery without significant narrowing.  Plaque right carotid bifurcation with 54%  stenosis proximal right internal carotid artery.  Ectatic left common carotid artery without significant narrowing. No significant stenosis or irregularity of the left carotid bifurcation.  The right subclavian artery is not completely included present examination.  Mild to moderate narrowing and irregularity left subclavian artery.  Mild to slightly moderate narrowing proximal vertebral artery bilaterally.  The present exam may overestimate the degree of stenosis in this region.  Beyond this level, no significant stenosis of the vertebral artery.  IMPRESSION: 54% diameter stenosis proximal right internal carotid artery.  No significant stenosis or irregularity of the left carotid bifurcation.  The right subclavian artery is not completely included present examination.  Mild to moderate narrowing and irregularity left subclavian artery.  Mild to slightly moderate narrowing proximal vertebral artery bilaterally.  The present exam may overestimate the degree of stenosis in this region.   Original Report Authenticated By: Genia Del, M.D.   Mr Mra Head/brain Wo Cm  04/23/2013   *RADIOLOGY REPORT*  Clinical Data:  Diabetic hypertensive patient with hypercholesterolemia presenting with slurred speech and  blurred vision with nausea and vomiting.  MRI BRAIN WITH AND WITHOUT CONTRAST MRA HEAD WITHOUT CONTRAST MRA NECK WITHOUT AND WITH CONTRAST  Technique: Multiplanar, multiecho pulse sequences of the brain and surrounding structures were obtained according to standard protocol with and without intravenous contrast.  Angiographic images of the Circle of Willis were obtained using MRA technique without intravenous contrast.  Angiographic images of the neck were obtained using MRA technique without and with intravenous contrast.  Contrast:20 ml MultiHance.  Comparison:04/22/2013 CT.  11/23/2011 MR.  MRI HEAD  Findings: No acute infarct.  No intracranial hemorrhage.  Moderate small vessel disease type changes.  Global  atrophy without hydrocephalus.  No intracranial mass or abnormal enhancement.  Cervical spondylotic changes with very mild spinal stenosis C2-3 and C3-4.  Cervical medullary junction, pituitary region, pineal region and orbital structures unremarkable.  Minimal mucosal thickening paranasal sinuses.  IMPRESSION: No acute infarct.  Moderate small vessel disease type changes.  Global atrophy without hydrocephalus.  MRA HEAD  Findings:Mild irregularity with regions of narrowing and ectasia involving the cavernous segment of the internal carotid artery bilaterally.  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  Beyond this region, anterior circulation without medium or large size vessel significant stenosis or occlusion.  Ectatic vertebral arteries and basilar artery.  Mild narrowing distal right vertebral artery.  Nonvisualization left PICA and right AICA.  Narrowed irregular right PICA and left AICA.  Superior cerebellar artery irregularity and narrowing more notable on the right.  Posterior cerebral artery.  mild branch vessel irregularity.  IMPRESSION: Mild irregularity with regions of narrowing and ectasia involving the cavernous segment of the internal carotid artery bilaterally. 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  Posterior circulation branch vessel irregularity as noted above.  MRA NECK  Findings:Normal configuration of the origin of the great vessels from the aortic arch.  Ectatic right common carotid artery without significant narrowing.  Plaque right carotid bifurcation with 54% stenosis proximal right internal carotid artery.  Ectatic left common carotid artery without significant narrowing. No significant stenosis or irregularity of the left carotid bifurcation.  The right subclavian artery is not completely included present examination.   Mild to moderate narrowing and irregularity left subclavian artery.  Mild to slightly moderate narrowing proximal vertebral artery bilaterally.  The present exam may overestimate the degree of stenosis in this region.  Beyond this level, no significant stenosis of the vertebral artery.  IMPRESSION: 54% diameter stenosis proximal right internal carotid artery.  No significant stenosis or irregularity of the left carotid bifurcation.  The right subclavian artery is not completely included present examination.  Mild to moderate narrowing and irregularity left subclavian artery.  Mild to slightly moderate narrowing proximal vertebral artery bilaterally.  The present exam may overestimate the degree of stenosis in this region.   Original Report Authenticated By: Genia Del, M.D.       Microbiology: No results found for this or any previous visit (from the past 240 hour(s)).   Labs: Results for orders placed during the hospital encounter of 04/22/13 (from the past 48 hour(s))  GLUCOSE, CAPILLARY     Status: Abnormal   Collection Time    04/23/13  1:17 PM      Result Value Range   Glucose-Capillary 154 (*) 70 - 99 mg/dL  POTASSIUM     Status: Abnormal   Collection Time  04/23/13  1:50 PM      Result Value Range   Potassium 3.2 (*) 3.5 - 5.1 mEq/L  MAGNESIUM     Status: None   Collection Time    04/23/13  1:51 PM      Result Value Range   Magnesium 2.1  1.5 - 2.5 mg/dL  GLUCOSE, CAPILLARY     Status: Abnormal   Collection Time    04/23/13  4:37 PM      Result Value Range   Glucose-Capillary 128 (*) 70 - 99 mg/dL  GLUCOSE, CAPILLARY     Status: Abnormal   Collection Time    04/23/13 10:12 PM      Result Value Range   Glucose-Capillary 100 (*) 70 - 99 mg/dL  GLUCOSE, CAPILLARY     Status: Abnormal   Collection Time    04/24/13  7:33 AM      Result Value Range   Glucose-Capillary 119 (*) 70 - 99 mg/dL  GLUCOSE, CAPILLARY     Status: Abnormal   Collection Time    04/24/13 11:34 AM       Result Value Range   Glucose-Capillary 123 (*) 70 - 99 mg/dL  BASIC METABOLIC PANEL     Status: Abnormal   Collection Time    04/24/13 11:45 AM      Result Value Range   Sodium 140  135 - 145 mEq/L   Potassium 3.9  3.5 - 5.1 mEq/L   Comment: DELTA CHECK NOTED     HEMOLYSIS AT THIS LEVEL MAY AFFECT RESULT   Chloride 103  96 - 112 mEq/L   CO2 29  19 - 32 mEq/L   Glucose, Bld 113 (*) 70 - 99 mg/dL   BUN 12  6 - 23 mg/dL   Creatinine, Ser 1.21  0.50 - 1.35 mg/dL   Calcium 9.1  8.4 - 10.5 mg/dL   GFR calc non Af Amer 56 (*) >90 mL/min   GFR calc Af Amer 64 (*) >90 mL/min   Comment:            The eGFR has been calculated     using the CKD EPI equation.     This calculation has not been     validated in all clinical     situations.     eGFR's persistently     <90 mL/min signify     possible Chronic Kidney Disease.  MAGNESIUM     Status: None   Collection Time    04/24/13 11:45 AM      Result Value Range   Magnesium 2.0  1.5 - 2.5 mg/dL  GLUCOSE, CAPILLARY     Status: Abnormal   Collection Time    04/24/13  4:25 PM      Result Value Range   Glucose-Capillary 102 (*) 70 - 99 mg/dL  GLUCOSE, CAPILLARY     Status: Abnormal   Collection Time    04/24/13 10:20 PM      Result Value Range   Glucose-Capillary 121 (*) 70 - 99 mg/dL   Comment 1 Notify RN    GLUCOSE, CAPILLARY     Status: Abnormal   Collection Time    04/25/13  6:52 AM      Result Value Range   Glucose-Capillary 110 (*) 70 - 99 mg/dL   Comment 1 Notify RN       HPI : 79 y.o. 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 Admitted 04/23/2013 with dizziness and slurred speech. MRI of the brain negative for acute infarct. 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  MRA of the neck with 54% diameter stenosis proximal right ICA. Echocardiogram with ejection fraction [id number] grade 1 diastolic dysfunction. Patient did not receive TPA. Neurology services consulted workup ongoing presently on Plavix therapy for CVA prophylaxis as well as subcutaneous heparin for DVT prophylaxis. Neurology has requested physical medicine rehabilitation consult to consider inpatient rehabilitation services.  HOSPITAL COURSE:  Acute CVA significant left nystagmus Diabetes, HgbA1c 6.6 Hyperlipidemia, LDL 40, on crestor 10 PTA, now on crestor 10 mg daily, goal LDL < 100 (< 70 for diabetics Continue clopidogrel 75 mg orally every day alone for secondary stroke prevention. Stop daily aspirin. No indication for dual antiplatelets Urine Drug Screen:  No results found for this basename: labopia, cocainscrnur, labbenz, amphetmu, thcu, labbarb    Alcohol Level: No results found for this basename: ETH, in the last 168 hours  CT of the brain 04/22/2013 No acute findings. Stable atrophy.  MRI of the brain 04/23/2013 No acute infarct. Moderate small vessel disease type changes. Global atrophy without hydrocephalus.  MRA of the brain 04/23/2013 Mild irregularity with regions of narrowing and ectasia involving the cavernous segment of the internal carotid artery bilaterally. 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 Posterior circulation branch vessel irregularity  MRA Neck 04/23/2013 54% diameter stenosis proximal right internal carotid artery. No significant stenosis or irregularity of the left carotid bifurcation. The right subclavian artery is not completely included present examination. Mild to moderate narrowing and irregularity left subclavian artery. Mild to slightly moderate narrowing proximal vertebral artery bilaterally. The present exam may overestimate the degree of stenosis in this region.  2D Echocardiogram   Carotid Doppler See MRA neck  CXR 04/23/2013 No active disease.  EKG normal sinus rhythm, RBBB  Final disposition-CIR versus home with home health, repeat PT OT evaluation pending   -ANCA positive vasculitis/chronic kidney disease -, creatinine stable, continue immunosuppressants.    Diabetes mellitus type 2 - closely follow CBGs outpatient. Resume all regimen of insulin  Hypertension - continue home medications. Lasix decreased to 40 mg once a day  Hyperlipidemia - continue statins.   History of gout - continue present medications.   History of Hiradinitis suppuritiva.       Discharge Exam:  Blood pressure 150/75, pulse 65, temperature 98.6 F (37 C), temperature source Oral, resp. rate 15, height 5\' 9"  (1.753 m), weight 98.8 kg (217 lb 13 oz), SpO2 100.00%.  Pupils round and reactive to light  Neck: Normal range of motion. Neck supple. No thyromegaly present.  Cardiovascular: Normal rate and regular rhythm.  Pulmonary/Chest: Effort normal and breath sounds normal. No respiratory distress.  Abdominal: Soft. Bowel sounds are normal. He exhibits no distension.  Musculoskeletal: He exhibits no edema.  Neurological: He is alert and oriented to person, place, and time.  Patient follows three-step commands. His speech is dysarthric but fully intelligible. (baseline per wife). Pt was guarded with scanning to different fields. When he did track and with sudden movements, no nystagmus was provoked.  Skin: Skin is warm and dry.  Psychiatric: He has a normal mood and affect. His behavior is normal. Judgment and thought content normal.  SignedReyne Dumas 04/25/2013, 8:38 AM

## 2013-04-25 NOTE — Progress Notes (Signed)
Physical Therapy Treatment Patient Details Name: Ryan Jimenez MRN: [id number] DOB: 23-Dec-1931 Today's Date: 04/25/2013 Time: [id number] PT Time Calculation (min): 26 min  PT Assessment / Plan / Recommendation Comments on Treatment Session  Patient continues to be limited with mobility due to dizziness, nausea and poor body spacial awareness.  Feel will benefit from CIR level therapies prior to d/c home to maximize safety and independence for return home with spouse supervision.    Follow Up Recommendations  CIR     Does the patient have the potential to tolerate intense rehabilitation   Yes  Barriers to Discharge  None      Equipment Recommendations  None recommended by PT    Recommendations for Other Services  None  Frequency Min 4X/week   Plan Discharge plan remains appropriate    Precautions / Restrictions Precautions Precautions: Fall   Pertinent Vitals/Pain No pain complaints    Mobility  Bed Mobility Bed Mobility: Sit to Supine Sit to Supine: 5: Supervision;HOB flat Details for Bed Mobility Assistance: increased time and disoriented upon return to supine, not dizzy.   Transfers Sit to Stand: 4: Min assist;From bed;From chair/3-in-1 Stand to Sit: To chair/3-in-1;To bed;4: Min assist Details for Transfer Assistance: unsafe technique, pulls up on walker and holds on walker with uncontrolled descent onto bed. Ambulation/Gait Ambulation/Gait Assistance: 4: Min assist Ambulation Distance (Feet): 90 Feet Assistive device: Rolling walker Ambulation/Gait Assistance Details: Cues throughout for eyes on stationary target due to dizziness and spacial orientation difficulty with broad based gait and difficulty on turns with increased trunk flexion effective to lower center of gravity due to imbalance.  One episode running into object on left moving through door frame with assist for negotiating around. Gait Pattern: Step-through pattern;Trunk flexed;Wide base of  support;Ataxic Modified Rankin (Stroke Patients Only) Pre-Morbid Rankin Score: No symptoms Modified Rankin: Moderately severe disability     PT Goals Acute Rehab PT Goals Pt will go Supine/Side to Sit: Independently PT Goal: Supine/Side to Sit - Progress: Progressing toward goal Pt will go Sit to Stand: Independently PT Goal: Sit to Stand - Progress: Progressing toward goal Pt will Ambulate: >150 feet;with supervision;with least restrictive assistive device PT Goal: Ambulate - Progress: Progressing toward goal Additional Goals Additional Goal #1: Pt to have negative vestibular testing. PT Goal: Additional Goal #1 - Progress: Progressing toward goal  Visit Information  Last PT Received On: 04/25/13 Assistance Needed: +1    Subjective Data  Subjective: Patient reports incontinent of urine due to increased lasix.  Reports has never had this problem before.  Dizziness 5/10 seated, 6/10 walking with mild nausea after walking.   Cognition  Cognition Arousal/Alertness: Awake/alert Behavior During Therapy: WFL for tasks assessed/performed Overall Cognitive Status: Within Functional Limits for tasks assessed    Balance  Dynamic Standing Balance Dynamic Standing - Balance Support: No upper extremity supported;During functional activity Dynamic Standing - Level of Assistance: 4: Min assist Dynamic Standing - Comments: standing approx 3 minutes washing perineal area due to bladder incontinence.  Again crouches down to lower center of gravity and braces legs against bed  End of Session PT - End of Session Equipment Utilized During Treatment: Gait belt Activity Tolerance: Patient limited by fatigue Patient left: with call bell/phone within reach;in bed;with bed alarm set   GP     [facility] 04/25/2013, 12:27 PM Magda Kiel, [place] 04/25/2013

## 2013-04-25 NOTE — H&P (Signed)
Chief Complaint   Patient presents with   .  Dizziness   :  HPI: Ryan Jimenez is a 79 y.o. right-handed male with history of c-ANCA vasculitis , hypertension, diabetes mellitus with peripheral neuropathy and chronic renal insufficiency with baseline creatinine 1.70. Admitted 04/23/2013 with dizziness and slurred speech. MRI of the brain negative for acute infarct. 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 MRA of the neck with 54% diameter stenosis proximal right ICA. Echocardiogram with ejection fraction [id number] grade 1 diastolic dysfunction. Patient did not receive TPA. Neurology services consulted placed on Plavix therapy for CVA prophylaxis as well as subcutaneous heparin for DVT prophylaxis. Suspect small right brainstem infarct not seen on MRI felt to be thrombotic secondary small vessel disease. Physical and occupational therapy evaluations completed with recommendations of physical medicine rehabilitation consult.. M.D. is requested physical medicine rehabilitation consult to consider inpatient rehabilitation services. Patient was felt to be a good candidate for inpatient rehabilitation services and was admitted for a comprehensive rehabilitation program   Pt comfortable in bed, no pain c/os good bed mobility, no dizziness at rest Review of Systems  Eyes: Positive for blurred vision.  Respiratory: Positive for shortness of breath.  Musculoskeletal: Positive for myalgias.  Neurological: Positive for dizziness and speech change.  All other systems reviewed and are negative  Past Medical History   Diagnosis  Date   .  Hypertension    .  Diabetes mellitus    .  Hypercholesterolemia    .  Gout    .  Shortness of breath on exertion    .  Bronchitis    .  Arthritis    .  Near syncope  10/2011; 11/2011   .  Renal disorder    .  CKD (chronic kidney disease)    .  Glomerulonephritis    .   Pancytopenia    .  Anemia     Past Surgical History   Procedure  Laterality  Date   .  Hydrocele excision     .  Cataract extraction w/ intraocular lens implant   ~ 2009     right    Family History   Problem  Relation  Age of Onset   .  Hypertension  Mother    .  Heart disease  Mother    .  Diabetes  Sister     Social History: reports that he has quit smoking. His smoking use included Cigarettes. He has a 40 pack-year smoking history. He has never used smokeless tobacco. He reports that he does not drink alcohol or use illicit drugs.  Allergies: No Known Allergies  Medications Prior to Admission   Medication  Sig  Dispense  Refill   .  amLODipine (NORVASC) 10 MG tablet  Take 10 mg by mouth daily.     [name]  azaTHIOprine (IMURAN) 50 MG tablet  Take 100 mg by mouth daily.     .  carvedilol (COREG) 12.5 MG tablet  Take 12.5 mg by mouth 2 (two) times daily with a meal.     .  colchicine 0.6 MG tablet  Take 0.6 mg by mouth 2 (two) times daily as needed (for gout flareups).     .  cyclophosphamide (CYTOXAN) 50 MG tablet  Take 50 mg by mouth daily. Give on an empty stomach 1 hour before or 2 hours after meals.     [name]  diclofenac sodium (VOLTAREN) 1 % GEL  Apply 4 g topically 2 (two) times daily.     [name]  dutasteride (AVODART) 0.5 MG capsule  Take 0.5 mg by mouth daily.     .  hydrALAZINE (APRESOLINE) 100 MG tablet  Take 100 mg by mouth 2 (two) times daily.     .  insulin aspart (NOVOLOG FLEXPEN) 100 UNIT/ML injection  Inject 2-10 Units into the skin 2 (two) times daily before a meal. Per SSI     .  Lactobacillus (ACIDOPHILUS PROBIOTIC) 100 MG CAPS  Take 1 capsule (100 mg total) by mouth 3 (three) times daily.  90 capsule  0   .  potassium chloride SA (K-DUR,KLOR-CON) 20 MEQ tablet  Take 20 mEq by mouth daily.     .  rosuvastatin (CRESTOR) 10 MG tablet  Take 10 mg by mouth at bedtime.     .  Tamsulosin HCl (FLOMAX) 0.4 MG CAPS  Take 0.4 mg by mouth at bedtime.     Sallye Lat  (EYE DROPS ADVANCED RELIEF OP)  Place 1 drop into both eyes daily.     .  Vitamin D, Ergocalciferol, (DRISDOL) 50000 UNITS CAPS  Take 50,000 Units by mouth every 7 (seven) days. Take on sundays     .  [DISCONTINUED] aspirin 81 MG EC tablet  Take 81 mg by mouth daily. Swallow whole.     .  [DISCONTINUED] ciprofloxacin (CIPRO) 500 MG tablet  Take 500 mg by mouth 2 (two) times daily.     .  [DISCONTINUED] furosemide (LASIX) 40 MG tablet  Take 40 mg by mouth 2 (two) times daily.      Home:  Home Living  Lives With: Spouse  Available Help at Discharge: Available 24 hours/day  Type of Home: House  Home Access: Stairs to enter  [employer] of Steps: 2  Entrance Stairs-Rails: None  Home Layout: One level  Bathroom Shower/Tub: Administrator, [employer] Service: Standard  Home Adaptive Equipment: Straight cane;Walker - rolling;Bedside commode/3-in-1;Tub transfer bench  Additional Comments: Pt splits time between [place] and [place]  Functional History:  Prior Function  Able to Take Stairs?: Yes  Driving: Yes  Vocation: Retired  Functional Status:  Mobility:  Bed Mobility  Bed Mobility: Supine to Sit  Rolling Right: 5: Supervision  Right Sidelying to Sit: 4: Min assist;With rails  Supine to Sit: 4: Min guard  Sitting - Scoot to [employer] of Bed: 4: Min guard  Sit to Supine: 5: Supervision;HOB flat  Transfers  Transfers: Sit to Stand;Stand to Sit  Sit to Stand: 4: Min assist;With upper extremity assist;From bed  Stand to Sit: 4: Min assist;With upper extremity assist;To chair/3-in-1  Ambulation/Gait  Ambulation/Gait Assistance: 4: Min assist  Ambulation Distance (Feet): 90 Feet  Assistive device: Rolling walker  Ambulation/Gait Assistance Details: Cues throughout for eyes on stationary target due to dizziness and spacial orientation difficulty with broad based gait and difficulty on turns with increased trunk flexion effective to lower center of gravity due to imbalance. One episode  running into object on left moving through door frame with assist for negotiating around.  Gait Pattern: Step-through pattern;Trunk flexed;Wide base of support;Ataxic  Stairs: No  Wheelchair Mobility  Wheelchair Mobility: No  ADL:  ADL  Eating/Feeding: Independent  Where Assessed - Eating/Feeding: Chair  Grooming: Wash/dry hands;Wash/dry face;Teeth care;Supervision/safety  Where Assessed - Grooming: Unsupported sitting  Upper Body Bathing: Supervision/safety  Where Assessed - Upper Body Bathing: Unsupported sitting  Lower Body Bathing: Minimal  assistance  Where Assessed - Lower Body Bathing: Supported sit to stand  Upper Body Dressing: Supervision/safety  Where Assessed - Upper Body Dressing: Unsupported sitting  Lower Body Dressing: Minimal assistance  Where Assessed - Lower Body Dressing: Supported sit to stand  Toilet Transfer: Moderate assistance  Toilet Transfer Method: Sit to stand;Stand pivot  Toilet Transfer Equipment: Comfort height toilet  Transfers/Ambulation Related to ADLs: min A  ADL Comments: Pt initially moved to EOB with supervision, but unable to sustain due to dizziness - significant nystagmus noted with dizziness 7-8/10. Pt returned to supine. An "A" was taped to bathroom door with pt intstruction to stabilze gaze on object with transitioning. Second attempt to transition supine to sit was successful with dizziness 5/10. Pt performed LB ADLs with dizziness 6-7/10 and long rest break. Pt transferred to recliner with min A, and was intstructed in saccadic exercises and gaze stabilization - was able to return demonstration  Cognition:  Cognition  Overall Cognitive Status: Within Functional Limits for tasks assessed  Arousal/Alertness: Awake/alert  Orientation Level: Oriented X4  Attention: Sustained  Sustained Attention: Impaired  Sustained Attention Impairment: Verbal basic (lethargy)  Memory: (to be assessed further)  Awareness: (intellectual intact, will assess  further)  Problem Solving: Appears intact  Safety/Judgment: Appears intact  Cognition  Arousal/Alertness: Awake/alert  Behavior During Therapy: WFL for tasks assessed/performed  Overall Cognitive Status: Within Functional Limits for tasks assessed  Physical Exam:  Blood pressure 157/75, pulse 66, temperature 98.4 F (36.9 C), temperature source Oral, resp. rate 18, height 5\' 9"  (1.753 m), weight 98.8 kg (217 lb 13 oz), SpO2 100.00%.  Physical Exam  Vitals reviewed.  Constitutional: He is oriented to person, place, and time.  HENT:  Head: Normocephalic.  Eyes:  Pupils round and reactive to light  Neck: Normal range of motion. Neck supple. No thyromegaly present.  Cardiovascular: Normal rate and regular rhythm.  Pulmonary/Chest: Effort normal and breath sounds normal. No respiratory distress.  Abdominal: Soft. Bowel sounds are normal. He exhibits no distension.  Musculoskeletal: He exhibits no edema.  Neurological: He is alert and oriented to person, place, and time.  Patient follows three-step commands. His speech is dysarthric but fully intelligible. (baseline per wife). Pt was guarded with scanning to different fields. When he did track and with sudden movements, no nystagmus was provoked.  CN 2-12 intact Visual fields are intact to confrontation testing Motor:  5/5 in BUE and BLE Cerebellar:  Negative ataxia on FNF testing Sitting balance is good in bed Skin: Skin is warm and dry.  Psychiatric: He has a normal mood and affect. His behavior is normal. Judgment and thought content normal  Results for orders placed during the hospital encounter of 04/22/13 (from the past 48 hour(s))   GLUCOSE, CAPILLARY Status: Abnormal    Collection Time    04/23/13 1:17 PM   Result  Value  Range    Glucose-Capillary  154 (*)  70 - 99 mg/dL   POTASSIUM Status: Abnormal    Collection Time    04/23/13 1:50 PM   Result  Value  Range    Potassium  3.2 (*)  3.5 - 5.1 mEq/L   MAGNESIUM Status:  None    Collection Time    04/23/13 1:51 PM   Result  Value  Range    Magnesium  2.1  1.5 - 2.5 mg/dL   GLUCOSE, CAPILLARY Status: Abnormal    Collection Time    04/23/13 4:37 PM   Result  Value  Range  Glucose-Capillary  128 (*)  70 - 99 mg/dL   GLUCOSE, CAPILLARY Status: Abnormal    Collection Time    04/23/13 10:12 PM   Result  Value  Range    Glucose-Capillary  100 (*)  70 - 99 mg/dL   GLUCOSE, CAPILLARY Status: Abnormal    Collection Time    04/24/13 7:33 AM   Result  Value  Range    Glucose-Capillary  119 (*)  70 - 99 mg/dL   GLUCOSE, CAPILLARY Status: Abnormal    Collection Time    04/24/13 11:34 AM   Result  Value  Range    Glucose-Capillary  123 (*)  70 - 99 mg/dL   BASIC METABOLIC PANEL Status: Abnormal    Collection Time    04/24/13 11:45 AM   Result  Value  Range    Sodium  140  135 - 145 mEq/L    Potassium  3.9  3.5 - 5.1 mEq/L    Comment:  DELTA CHECK NOTED     HEMOLYSIS AT THIS LEVEL MAY AFFECT RESULT    Chloride  103  96 - 112 mEq/L    CO2  29  19 - 32 mEq/L    Glucose, Bld  113 (*)  70 - 99 mg/dL    BUN  12  6 - 23 mg/dL    Creatinine, Ser  1.21  0.50 - 1.35 mg/dL    Calcium  9.1  8.4 - 10.5 mg/dL    GFR calc non Af Amer  56 (*)  >90 mL/min    GFR calc Af Amer  64 (*)  >90 mL/min    Comment:      The eGFR has been calculated     using the CKD EPI equation.     This calculation has not been     validated in all clinical     situations.     eGFR's persistently     <90 mL/min signify     possible Chronic Kidney Disease.   MAGNESIUM Status: None    Collection Time    04/24/13 11:45 AM   Result  Value  Range    Magnesium  2.0  1.5 - 2.5 mg/dL   GLUCOSE, CAPILLARY Status: Abnormal    Collection Time    04/24/13 4:25 PM   Result  Value  Range    Glucose-Capillary  102 (*)  70 - 99 mg/dL   GLUCOSE, CAPILLARY Status: Abnormal    Collection Time    04/24/13 10:20 PM   Result  Value  Range    Glucose-Capillary  121 (*)  70 - 99 mg/dL    Comment  1  Notify RN    GLUCOSE, CAPILLARY Status: Abnormal    Collection Time    04/25/13 6:52 AM   Result  Value  Range    Glucose-Capillary  110 (*)  70 - 99 mg/dL    Comment 1  Notify RN    GLUCOSE, CAPILLARY Status: None    Collection Time    04/25/13 11:37 AM   Result  Value  Range    Glucose-Capillary  99  70 - 99 mg/dL    No results found.  Post Admission Physician Evaluation:  1. Functional deficits secondary to benign positional vertigo vs samll brainstem infarct. 2. Patient is admitted to receive collaborative, interdisciplinary care between the physiatrist, rehab nursing staff, and therapy team. 3. Patient's level of medical complexity and substantial therapy needs in context of that medical necessity cannot  be provided at a lesser intensity of care such as a SNF. 4. Patient has experienced substantial functional loss from his/her baseline which was documented above under the "Functional History" and "Functional Status" headings. Judging by the patient's diagnosis, physical exam, and functional history, the patient has potential for functional progress which will result in measurable gains while on inpatient rehab. These gains will be of substantial and practical use upon discharge in facilitating mobility and self-care at the household level. 5. Physiatrist will provide 24 hour management of medical needs as well as oversight of the therapy plan/treatment and provide guidance as appropriate regarding the interaction of the two. 6. 24 hour rehab nursing will assist with bladder management, bowel management, safety, skin/wound care, disease management, medication administration, pain management and patient education and help integrate therapy concepts, techniques,education, etc. 7. PT will assess and treat for/with: pre gait, gait, NM re education, vestibular eval and tx, safety, equipment. Goals are: Mod I Mobility. 8. OT will assess and treat for/with: ADL,Cog/perceptual, NM re education,  safety, endurance eqipment. Goals are: Mod I ADL. 9. SLP will assess and treat for/with: NA. Goals are: NA. 10. Case Management and Social Worker will assess and treat for psychological issues and discharge planning. 11. Team conference will be held weekly to assess progress toward goals and to determine barriers to discharge. 12. Patient will receive at least 3 hours of therapy per day at least 5 days per week. 13. ELOS: 5-7 days Prognosis: excellent Medical Problem List and Plan:  1. 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 DVT Prophylaxis/Anticoagulation: Subcutaneous heparin. Monitor platelet counts and any signs of bleeding  3. Pain Management: Tylenol as needed. Monitor with increased mobility  4. Neuropsych: This patient is capable of making decisions on his/her own behalf.  5. ANCA positive vasculitis chronic kidney disease. Followup chemistries continue immunosuppressants. Baseline creatinine 1.70  6. Diabetes mellitus with peripheral neuropathy. Hemoglobin A1c 6.6. Continue sliding scale insulin. Check blood sugars a.c. and at bedtime.  7.Hypertension. Lasix 40 mg daily, hydralazine 50 mg twice a day, Coreg 12.5 mg twice a day. Monitor with increased activity  8.BPH. Avodart /Flomax. Check PVRs x3  04/25/2013

## 2013-04-26 ENCOUNTER — Inpatient Hospital Stay (HOSPITAL_COMMUNITY): Payer: Medicare Other | Admitting: Physical Therapy

## 2013-04-26 ENCOUNTER — Inpatient Hospital Stay (HOSPITAL_COMMUNITY): Payer: Medicare Other

## 2013-04-26 ENCOUNTER — Inpatient Hospital Stay (HOSPITAL_COMMUNITY): Payer: Medicare Other | Admitting: Speech Pathology

## 2013-04-26 DIAGNOSIS — I6330 Cerebral infarction due to thrombosis of unspecified cerebral artery: Secondary | ICD-10-CM

## 2013-04-26 LAB — CBC WITH DIFFERENTIAL/PLATELET
Hemoglobin: 11.5 g/dL — ABNORMAL LOW (ref 13.0–17.0)
Lymphs Abs: 0.6 10*3/uL — ABNORMAL LOW (ref 0.7–4.0)
Monocytes Relative: 14 % — ABNORMAL HIGH (ref 3–12)
Neutro Abs: 1.9 10*3/uL (ref 1.7–7.7)
Neutrophils Relative %: 58 % (ref 43–77)
RBC: 4 MIL/uL — ABNORMAL LOW (ref 4.22–5.81)
WBC: 3.2 10*3/uL — ABNORMAL LOW (ref 4.0–10.5)

## 2013-04-26 LAB — COMPREHENSIVE METABOLIC PANEL
ALT: 16 U/L (ref 0–53)
Albumin: 3.4 g/dL — ABNORMAL LOW (ref 3.5–5.2)
Alkaline Phosphatase: 52 U/L (ref 39–117)
BUN: 14 mg/dL (ref 6–23)
Chloride: 104 mEq/L (ref 96–112)
GFR calc Af Amer: 56 mL/min — ABNORMAL LOW (ref >90)
Glucose, Bld: 99 mg/dL (ref 70–99)
Potassium: 4.3 mEq/L (ref 3.5–5.1)
Total Bilirubin: 0.3 mg/dL (ref 0.3–1.2)

## 2013-04-26 LAB — GLUCOSE, CAPILLARY
Glucose-Capillary: 132 mg/dL — ABNORMAL HIGH (ref 70–99)
Glucose-Capillary: 131 mg/dL — ABNORMAL HIGH (ref 70–99)

## 2013-04-26 NOTE — Progress Notes (Signed)
Offered Sorbitol this am but refused

## 2013-04-26 NOTE — Evaluation (Signed)
Speech Language Pathology Assessment and Plan  Patient Details  Name: Ryan Jimenez MRN: [id number] Date of Birth: [birth year]  SLP Diagnosis:  (none)  Rehab Potential:  (defer to OT/PT) ELOS:  (defer to OT/PT)  Today's Date: 04/26/2013 Time: 1345-1430 Time Calculation (min): 45 min  Problem List:  Patient Active Problem List   Diagnosis Date Noted  . Thrombotic cerebral infarction 04/26/2013  . CVA (cerebral infarction) 04/23/2013  . Hidradenitis suppurativa of right axilla 04/05/2012  . Abscess of axillary region 04/04/2012  . Neutropenia 04/04/2012  . Pancytopenia due to chemotherapy 04/04/2012  . Anemia 04/04/2012  . Generalized weakness 04/04/2012  . Physical deconditioning 04/04/2012  . Benign positional vertigo 11/28/2011  . Hypertension 11/23/2011  . Renal failure 11/23/2011  . Type 2 diabetes mellitus 11/23/2011  . Hypercholesterolemia    Past Medical History:  Past Medical History  Diagnosis Date  . Hypertension   . Diabetes mellitus   . Hypercholesterolemia   . Gout   . Shortness of breath on exertion   . Bronchitis   . Arthritis   . Near syncope 10/2011; 11/2011  . Renal disorder   . CKD (chronic kidney disease)   . Glomerulonephritis   . Pancytopenia   . Anemia    Past Surgical History:  Past Surgical History  Procedure Laterality Date  . Hydrocele excision    . Cataract extraction w/ intraocular lens implant  ~ 2009    right    Assessment / Plan / Recommendation Clinical Impression  Ryan Jimenez is a 79 y.o. right-handed male with history of c-ANCA vasculitis , hypertension, diabetes mellitus with peripheral neuropathy and chronic renal insufficiency with baseline creatinine 1.70. Admitted 04/23/2013 with dizziness and slurred speech. MRI of the brain negative for acute infarct. 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 MRA of the neck  with 54% diameter stenosis proximal right ICA. Echocardiogram with ejection fraction [id number] grade 1 diastolic dysfunction. Patient did not receive TPA. Neurology services consulted placed on Plavix therapy for CVA prophylaxis as well as subcutaneous heparin for DVT prophylaxis. Suspect small right brainstem infarct not seen on MRI felt to be thrombotic secondary small vessel disease. Physical and occupational therapy evaluations completed with recommendations of physical medicine rehabilitation consult and patient was admitted for a comprehensive rehabilitation program 04/25/13. Cognitive-Linguistic Evaluation completed and revealed WFL abilities.  Patient demonstrated good ROM and oral motor strength with mild left sided lingual deviation; however, speech was intelligible and SLP recommended that patient place dentition to further improve intelligibility.  Patient's high level problem solving was slow per patient report; however, calculations were often completed as quickly as therapist's and as a result, patient is overall modified independent with tasks and requires no skilled SLP services.      SLP Assessment  Patient does not need any further Speech Lanaguage Pathology Services    Recommendations  Patient destination: Home Follow up Recommendations: None Equipment Recommended: None recommended by SLP    SLP Frequency  n/a  SLP Treatment/Interventions  n/a  Education:          SLP educated patient regarding use of speech intelligibly strategies as needed incase fatigue impacts intelligibility as well as recommendation to continue to activities that mentally challenge him such as continuing card games and social groups as before.     Pain Pain Assessment Pain Assessment: No/denies pain Prior Functioning Cognitive/Linguistic Baseline: Within functional limits Type  of Home: House Lives With: Spouse Available Help at Discharge: Family;Available 24 hours/day Vocation: Retired  Industrial/product designer Term  Goals: n/a  See FIM for current functional status  Recommendations for other services: None  Discharge Criteria: Patient will be discharged from SLP if patient refuses treatment 3 consecutive times without medical reason, if treatment goals not met, if there is a change in medical status, if patient makes no progress towards goals or if patient is discharged from hospital.  The above assessment, treatment plan, treatment alternatives and goals were discussed and mutually agreed upon: by patient  Gunnar Fusi, M.A., CCC-SLP [phone]  [place] 04/26/2013, 3:06 PM

## 2013-04-26 NOTE — Progress Notes (Signed)
Physical Therapy Note  Patient Details  Name: Ryan Jimenez MRN: [id number] Date of Birth: 1932-03-25 Today's Date: 04/26/2013  1500-1525 (25 minutes) individual Pain: no complaint of pain Other : 5/10 -6/10 c/o dizziness, slight blurred vision Focus of treatment:Therapeutic exercise focused on activity tolerance Treatment: pt in bathroom upon arrival; sit to stand from commode SBA ; gait around room with RW min assist with stagger to right; Nustep Level 4 X 10 minutes with 2 rest breaks.    Novia Lansberry,JIM 04/26/2013, 3:12 PM

## 2013-04-26 NOTE — Progress Notes (Signed)
Occupational Therapy Assessment and Plan  Patient Details  Name: Ryan Jimenez MRN: [id number] Date of Birth: 09-Feb-1932  OT Diagnosis: ataxia, disturbance of vision and muscle weakness (generalized) Rehab Potential: Rehab Potential: Excellent ELOS: 5-7 days   Today's Date: 04/26/2013 Time: 0730-0828 Time Calculation (min): 58 min  Problem List:  Patient Active Problem List   Diagnosis Date Noted  . Thrombotic cerebral infarction 04/26/2013  . CVA (cerebral infarction) 04/23/2013  . Hidradenitis suppurativa of right axilla 04/05/2012  . Abscess of axillary region 04/04/2012  . Neutropenia 04/04/2012  . Pancytopenia due to chemotherapy 04/04/2012  . Anemia 04/04/2012  . Generalized weakness 04/04/2012  . Physical deconditioning 04/04/2012  . Benign positional vertigo 11/28/2011  . Hypertension 11/23/2011  . Renal failure 11/23/2011  . Type 2 diabetes mellitus 11/23/2011  . Hypercholesterolemia     Past Medical History:  Past Medical History  Diagnosis Date  . Hypertension   . Diabetes mellitus   . Hypercholesterolemia   . Gout   . Shortness of breath on exertion   . Bronchitis   . Arthritis   . Near syncope 10/2011; 11/2011  . Renal disorder   . CKD (chronic kidney disease)   . Glomerulonephritis   . Pancytopenia   . Anemia    Past Surgical History:  Past Surgical History  Procedure Laterality Date  . Hydrocele excision    . Cataract extraction w/ intraocular lens implant  ~ 2009    right    Assessment & Plan Clinical Impression: Patient is a 79 y.o. year old male with recent admission to the hospital on 04/23/2013 with dizziness and slurred speech. MRI of the brain negative for acute infarct. 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 MRA of the neck with 54% diameter stenosis proximal right ICA. Echocardiogram with ejection fraction [id number] grade 1 diastolic  dysfunction. Patient did not receive TPA. Neurology services consulted placed on Plavix therapy for CVA prophylaxis as well as subcutaneous heparin for DVT prophylaxis. Suspect small right brainstem infarct not seen on MRI felt to be thrombotic secondary small vessel disease. Physical and occupational therapy evaluations completed with recommendations of physical medicine rehabilitation consult.. M.D. is requested physical medicine rehabilitation consult to consider inpatient rehabilitation services. Patient was felt to be a good candidate for inpatient rehabilitation services and was admitted for a comprehensive rehabilitation program. Patient transferred to CIR on 04/25/2013 .    Patient currently requires min with basic self-care skills secondary to muscle weakness, decreased cardiorespiratoy endurance, impaired timing and sequencing, motor apraxia, ataxia and decreased motor planning, decreased visual acuity and central origin.  Prior to hospitalization, patient could complete BADLs with modified independent .  Patient will benefit from skilled intervention to increase independence with basic self-care skills prior to discharge home with care partner.  Anticipate patient will require no assist as pt intended to be Mod I and follow up outpatient.  OT - End of Session Activity Tolerance: Decreased this session Endurance Deficit: Yes OT Assessment Rehab Potential: Excellent Barriers to Discharge: None OT Plan OT Intensity: Minimum of 1-2 x/day, 45 to 90 minutes OT Frequency: 5 out of 7 days OT Duration/Estimated Length of Stay: 5-7 days OT Treatment/Interventions: Balance/vestibular training;Community reintegration;Discharge planning;DME/adaptive equipment instruction;Functional mobility training;Pain management;Neuromuscular re-education;Patient/family education;Psychosocial support;Self Care/advanced ADL retraining;Therapeutic Activities;UE/LE Strength taining/ROM;Therapeutic Exercise;UE/LE  Coordination activities;Visual/perceptual remediation/compensation OT Recommendation Recommendations for Other Services: Vestibular eval Patient destination: Home   Skilled Therapeutic  Intervention Pt engaged in skilled therapeutic intervention following OT evaluation focusing on ADL retraining, standing balance, and functional transfers. Pt ambulated from room to toilet with RW and min assist. Required verbal cues for RW safety as he tends to keep it too far from body. Required assist to wash feet in shower d/t dizziness when reaching down. Ambulated back to bed to complete dressing. Pt required long rest break d/t dizziness and reported need to "lay down." Pt sat back up and required rest break before completing dressing task. Verbal cues provided to thread RLE first as he reported always having difficulty lifting that leg. Stood with steadying-min assist to manage around waist. Slight lob posteriorly on one occasion requiring min assist to correct. Pt required long rest break before donning socks. Pt unable to flex forward to don socks however problem solved by propping foot on bed and able to do so with increased time. Pt with sob on 3 occasions during therapy session and resolved with pursed lip breathing in <30 sec. Pt recalled focusing on stable object when feeling dizzy and demonstrated carryover without cues approx 75% of time. Pt reporting 6-7/10 with dizziness and no c/o pain.   OT Evaluation Precautions/Restrictions  Precautions Precautions: Fall Restrictions Weight Bearing Restrictions: No General   Vital Signs Therapy Vitals Temp: 98.4 F (36.9 C) Temp src: Oral Pulse Rate: 59 Resp: 19 BP: 152/81 mmHg Patient Position, if appropriate: Lying Oxygen Therapy SpO2: 99 % O2 Device: None (Room air) Pain   Home Living/Prior Functioning Home Living Lives With: Spouse Available Help at Discharge: Available 24 hours/day Type of Home: House Home Access: Stairs to  enter [employer] of Steps: 2 Entrance Stairs-Rails: None Home Layout: One level Bathroom Shower/Tub: Chiropodist: Standard Home Adaptive Equipment: Straight cane;Walker - rolling;Bedside commode/3-in-1;Tub transfer bench Additional Comments: Pt splits time between [place] and [place] Prior Function Able to Take Stairs?: Yes Driving: Yes Vocation: Retired ADL   Vision/Perception  Vision - History Baseline Vision: Wears glasses all the time (wife bringing glasses) Patient Visual Report: Blurring of vision Vision - Assessment Eye Alignment: Within Functional Limits Vision Assessment: Vision tested Ocular Range of Motion: Other (comment) (impaired sustaining R gaze) Tracking/Visual Pursuits: Decreased smoothness of horizontal tracking Visual Fields: No apparent deficits Additional Comments: Presents with horizonatal nystagmus and difficulty sustaining R gaze. Peripheral vision intact.  Perception Perception: Within Functional Limits Praxis Praxis: Intact  Cognition Overall Cognitive Status: Within Functional Limits for tasks assessed Arousal/Alertness: Awake/alert Orientation Level: Oriented X4 Attention: Sustained Sustained Attention: Appears intact Sustained Attention Impairment: Verbal basic Memory: Appears intact Awareness: Appears intact Problem Solving: Appears intact Safety/Judgment: Appears intact Sensation Sensation Light Touch: Appears Intact Hot/Cold: Appears Intact Proprioception: Appears Intact Coordination Gross Motor Movements are Fluid and Coordinated: No Fine Motor Movements are Fluid and Coordinated: Yes Motor    Mobility     Trunk/Postural Assessment     Balance   Extremity/Trunk Assessment RUE Assessment RUE Assessment: Within Functional Limits LUE Assessment LUE Assessment: Within Functional Limits  FIM:  FIM - Eating Eating Activity: 6: Assistive device: dentures FIM - Grooming Grooming Steps: Wash,  rinse, dry face;Wash, rinse, dry hands Grooming: 5: Set-up assist to obtain items FIM - Bathing Bathing Steps Patient Completed: Chest;Right Arm;Left Arm;Abdomen;Front perineal area;Buttocks;Right upper leg;Left upper leg Bathing: 4: Min-Patient completes 8-9 34f 10 parts or 75+ percent FIM - Upper Body Dressing/Undressing Upper body dressing/undressing steps patient completed: Thread/unthread right sleeve of pullover shirt/dresss;Thread/unthread left sleeve of pullover shirt/dress;Put head through opening  of pull over shirt/dress;Pull shirt over trunk Upper body dressing/undressing: 5: Set-up assist to: Obtain clothing/put away FIM - Lower Body Dressing/Undressing Lower body dressing/undressing steps patient completed: Don/Doff right sock;Don/Doff left sock;Thread/unthread right underwear leg;Thread/unthread left underwear leg;Pull underwear up/down;Thread/unthread right pants leg;Thread/unthread left pants leg;Pull pants up/down Lower body dressing/undressing: 4: Steadying Assist FIM - Toileting Toileting steps completed by patient: Adjust clothing prior to toileting;Performs perineal hygiene;Adjust clothing after toileting Toileting Assistive Devices: Grab bar or rail for support Toileting: 4: Steadying assist FIM - IT sales professional Transfer: 4: Supine > Sit: Min A (steadying Pt. > 75%/lift 1 leg);4: Bed > Chair or W/C: Min A (steadying Pt. > 75%) FIM - Radio producer Devices: Grab bars;Walker Toilet Transfers: 4-To toilet/BSC: Min A (steadying Pt. > 75%);4-From toilet/BSC: Min A (steadying Pt. > 75%) FIM - Tub/Shower Transfers Tub/Shower Assistive Devices: Tub transfer bench;Walk in Database administrator Transfers: 4-Out of Tub/Shower: Min A (steadying Pt. > 75%/lift 1 leg);4-Into Tub/Shower: Min A (steadying Pt. > 75%/lift 1 leg)   Refer to Care Plan for Long Term Goals  Recommendations for other services: None  Discharge Criteria: Patient  will be discharged from OT if patient refuses treatment 3 consecutive times without medical reason, if treatment goals not met, if there is a change in medical status, if patient makes no progress towards goals or if patient is discharged from hospital.  The above assessment, treatment plan, treatment alternatives and goals were discussed and mutually agreed upon: by patient  Duayne Cal 04/26/2013, 8:38 AM

## 2013-04-26 NOTE — Progress Notes (Signed)
Patient ID: Ryan Jimenez, male   DOB: Apr 10, 1932, 79 y.o.   MRN: [id number] Subjective/Complaints: 79 y.o. right-handed male with history of c-ANCA vasculitis , hypertension, diabetes mellitus with peripheral neuropathy and chronic renal insufficiency with baseline creatinine 1.70. Admitted 04/23/2013 with dizziness and slurred speech. MRI of the brain negative for acute infarct. 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 MRA of the neck with 54% diameter stenosis proximal right ICA. Echocardiogram with ejection fraction [id number] grade 1 diastolic dysfunction. Patient did not receive TPA. Neurology services consulted placed on Plavix therapy for CVA prophylaxis as well as subcutaneous heparin for DVT prophylaxis. Suspect small right brainstem infarct not seen on MRI felt to be thrombotic secondary small vessel disease.   Feeling dizzy sitting at EOB, no N/V  Review of Systems  Gastrointestinal: Negative for nausea, abdominal pain and diarrhea.  Neurological: Positive for dizziness.  All other systems reviewed and are negative.     Objective: Vital Signs: Blood pressure 152/81, pulse 59, temperature 98.4 F (36.9 C), temperature source Oral, resp. rate 19, height 5\' 9"  (1.753 m), weight 97.1 kg (214 lb 1.1 oz), SpO2 99.00%. No results found. Results for orders placed during the hospital encounter of 04/25/13 (from the past 72 hour(s))  CBC     Status: Abnormal   Collection Time    04/25/13  8:39 PM      Result Value Range   WBC 3.4 (*) 4.0 - 10.5 K/uL   RBC 4.05 (*) 4.22 - 5.81 MIL/uL   Hemoglobin 11.6 (*) 13.0 - 17.0 g/dL   HCT 33.7 (*) 39.0 - 52.0 %   MCV 83.2  78.0 - 100.0 fL   MCH 28.6  26.0 - 34.0 pg   MCHC 34.4  30.0 - 36.0 g/dL   RDW 15.2  11.5 - 15.5 %   Platelets 138 (*) 150 - 400 K/uL  CREATININE, SERUM     Status: Abnormal   Collection Time    04/25/13  8:39 PM   Result Value Range   Creatinine, Ser 1.21  0.50 - 1.35 mg/dL   GFR calc non Af Amer 56 (*) >90 mL/min   GFR calc Af Amer 64 (*) >90 mL/min   Comment:            The eGFR has been calculated     using the CKD EPI equation.     This calculation has not been     validated in all clinical     situations.     eGFR's persistently     <90 mL/min signify     possible Chronic Kidney Disease.  GLUCOSE, CAPILLARY     Status: Abnormal   Collection Time    04/25/13  9:24 PM      Result Value Range   Glucose-Capillary 106 (*) 70 - 99 mg/dL  CBC WITH DIFFERENTIAL     Status: Abnormal   Collection Time    04/26/13  5:30 AM      Result Value Range   WBC 3.2 (*) 4.0 - 10.5 K/uL   RBC 4.00 (*) 4.22 - 5.81 MIL/uL   Hemoglobin 11.5 (*) 13.0 - 17.0 g/dL   HCT 33.4 (*) 39.0 - 52.0 %   MCV 83.5  78.0 - 100.0 fL   MCH 28.8  26.0 - 34.0 pg   MCHC 34.4  30.0 - 36.0 g/dL   RDW 15.3  11.5 -  15.5 %   Platelets 153  150 - 400 K/uL   Neutrophils Relative % 58  43 - 77 %   Neutro Abs 1.9  1.7 - 7.7 K/uL   Lymphocytes Relative 20  12 - 46 %   Lymphs Abs 0.6 (*) 0.7 - 4.0 K/uL   Monocytes Relative 14 (*) 3 - 12 %   Monocytes Absolute 0.4  0.1 - 1.0 K/uL   Eosinophils Relative 8 (*) 0 - 5 %   Eosinophils Absolute 0.3  0.0 - 0.7 K/uL   Basophils Relative 1  0 - 1 %   Basophils Absolute 0.0  0.0 - 0.1 K/uL  COMPREHENSIVE METABOLIC PANEL     Status: Abnormal   Collection Time    04/26/13  5:30 AM      Result Value Range   Sodium 141  135 - 145 mEq/L   Potassium 4.3  3.5 - 5.1 mEq/L   Chloride 104  96 - 112 mEq/L   CO2 29  19 - 32 mEq/L   Glucose, Bld 99  70 - 99 mg/dL   BUN 14  6 - 23 mg/dL   Creatinine, Ser 1.36 (*) 0.50 - 1.35 mg/dL   Calcium 9.2  8.4 - 10.5 mg/dL   Total Protein 6.8  6.0 - 8.3 g/dL   Albumin 3.4 (*) 3.5 - 5.2 g/dL   AST 23  0 - 37 U/L   ALT 16  0 - 53 U/L   Alkaline Phosphatase 52  39 - 117 U/L   Total Bilirubin 0.3  0.3 - 1.2 mg/dL   GFR calc non Af Amer 48 (*) >90 mL/min    GFR calc Af Amer 56 (*) >90 mL/min   Comment:            The eGFR has been calculated     using the CKD EPI equation.     This calculation has not been     validated in all clinical     situations.     eGFR's persistently     <90 mL/min signify     possible Chronic Kidney Disease.  GLUCOSE, CAPILLARY     Status: Abnormal   Collection Time    04/26/13  7:24 AM      Result Value Range   Glucose-Capillary 108 (*) 70 - 99 mg/dL   Comment 1 Notify RN        Physical Exam  Vitals reviewed.  Constitutional: He is oriented to person, place, and time.  HENT:  Head: Normocephalic.  Eyes:  Pupils round and reactive to light  Neck: Normal range of motion. Neck supple. No thyromegaly present.  Cardiovascular: Normal rate and regular rhythm.  Pulmonary/Chest: Effort normal and breath sounds normal. No respiratory distress.  Abdominal: Soft. Bowel sounds are normal. He exhibits no distension.  Musculoskeletal: He exhibits no edema.  Neurological: He is alert and oriented to person, place, and time.  Patient follows three-step commands. His speech is dysarthric but fully intelligible. (baseline per wife). Pt was guarded with scanning to different fields. When he did track and with sudden movements, no nystagmus was provoked.  CN 2-12 intact  Visual fields are intact to confrontation testing  Motor: 5/5 in BUE and BLE  Cerebellar: Negative ataxia on FNF testing  Sitting balance is good in bed  Skin: Skin is warm and dry.  Psychiatric: He has a normal mood and affect. His behavior is normal. Judgment and thought content normal    Assessment/Plan: 1.  Functional deficits secondary to BPV vs brainstem infarct which require 3+ hours per day of interdisciplinary therapy in a comprehensive inpatient rehab setting. Physiatrist is providing close team supervision and 24 hour management of active medical problems listed below. Physiatrist and rehab team continue to assess barriers to  discharge/monitor patient progress toward functional and medical goals. FIM:                                  Medical Problem List and Plan:  1. 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 DVT Prophylaxis/Anticoagulation: Subcutaneous heparin. Monitor platelet counts and any signs of bleeding  3. Pain Management: Tylenol as needed. Monitor with increased mobility  4. Neuropsych: This patient is capable of making decisions on his/her own behalf.  5. ANCA positive vasculitis chronic kidney disease. Followup chemistries continue immunosuppressants. Baseline creatinine 1.70  6. Diabetes mellitus with peripheral neuropathy. Hemoglobin A1c 6.6. Continue sliding scale insulin. Check blood sugars a.c. and at bedtime.  7.Hypertension. Lasix 40 mg daily, hydralazine 50 mg twice a day, Coreg 12.5 mg twice a day. Monitor with increased activity  8.BPH. Avodart /Flomax. Check PVRs x3   LOS (Days) 1 A FACE TO FACE EVALUATION WAS PERFORMED  Kaleel Schmieder E 04/26/2013, 8:01 AM

## 2013-04-26 NOTE — Progress Notes (Signed)
Social Work Assessment and Plan Social Work Assessment and Plan  Patient Details  Name: Ryan Jimenez MRN: [id number] Date of Birth: Dec 03, 1931  Today's Date: 04/26/2013  Problem List:  Patient Active Problem List   Diagnosis Date Noted  . Thrombotic cerebral infarction 04/26/2013  . CVA (cerebral infarction) 04/23/2013  . Hidradenitis suppurativa of right axilla 04/05/2012  . Abscess of axillary region 04/04/2012  . Neutropenia 04/04/2012  . Pancytopenia due to chemotherapy 04/04/2012  . Anemia 04/04/2012  . Generalized weakness 04/04/2012  . Physical deconditioning 04/04/2012  . Benign positional vertigo 11/28/2011  . Hypertension 11/23/2011  . Renal failure 11/23/2011  . Type 2 diabetes mellitus 11/23/2011  . Hypercholesterolemia    Past Medical History:  Past Medical History  Diagnosis Date  . Hypertension   . Diabetes mellitus   . Hypercholesterolemia   . Gout   . Shortness of breath on exertion   . Bronchitis   . Arthritis   . Near syncope 10/2011; 11/2011  . Renal disorder   . CKD (chronic kidney disease)   . Glomerulonephritis   . Pancytopenia   . Anemia    Past Surgical History:  Past Surgical History  Procedure Laterality Date  . Hydrocele excision    . Cataract extraction w/ intraocular lens implant  ~ 2009    right   Social History:  reports that he has quit smoking. His smoking use included Cigarettes. He has a 40 pack-year smoking history. He has never used smokeless tobacco. He reports that he does not drink alcohol or use illicit drugs.  Family / Support Systems Marital Status: Married Patient Roles: Spouse;Parent Spouse/Significant Other: Wells Guiles [phone]  [phone] Children: Jeralyn Ruths  [id number]  Other Supports: Brayton Layman  [phone] Anticipated Caregiver: Wife Ability/Limitations of Caregiver: Wife can not providde much physical assist-supervision to light min Caregiver Availability:  24/7 Family Dynamics: Close knit family two daughter's leaving tonight and coming down to check on dad from [place].  Pt reports he has plenty of support and will be fine at home  Social History Preferred language: English Religion: Baptist Cultural Background: No issues Education: [employer] Read: Yes Write: Yes Employment Status: Retired Freight forwarder Issues: No issues Guardian/Conservator: None-according to MD pt is capable of making his own decisions   Abuse/Neglect Physical Abuse: Denies Verbal Abuse: Denies Sexual Abuse: Denies Exploitation of patient/patient's resources: Denies Self-Neglect: Denies  Emotional Status Pt's affect, behavior adn adjustment status: Pt is motivated to [employer], he reprots main issue is his dizziness and blurred vision.  He is tyryng to work past this and move forward but it is difficult.  He feels better than he did when on acute. Recent Psychosocial Issues: Other medical issues Pyschiatric History: No history-deferred depression screen due to doing well and pt felt not necessary.  He feels with his family support and his faith he will get through this.  Will monitor hism throughout his stay Substance Abuse History: No issues  Patient / Family Perceptions, Expectations & Goals Pt/Family understanding of illness & functional limitations: Pt is able to explain his stroke and deficits.  He hopes his vision and dizziness will get better.  He will work hard in therapies and try to move through this.  He wants to get back to his independent level. Premorbid pt/family roles/activities: Husband, Father, grandfather, church member, retiree, etc Anticipated changes in roles/activities/participation: resume Pt/family expectations/goals: Pt states; ' I hope i can move around by myself, before I leave here."  " My daughter's are  coming and will be here to help if I need it."  [employer]: None Premorbid Home Care/DME  Agencies: Other (Comment) (Has DME from previous admits) Transportation available at discharge: Family wife doesn't drive Resource referrals recommended: Support group (specify) (CVA Support group)  Discharge Planning Living Arrangements: Spouse/significant other Support Systems: Spouse/significant other;Children;Friends/neighbors;Church/faith community Type of Residence: Private residence Insurance Resources: Education officer, museum (specify) Financial Resources: Radio broadcast assistant Screen Referred: No Living Expenses: Lives with family Money Management: Patient;Spouse Do you have any problems obtaining your medications?: No Home Management: Wife Patient/Family Preliminary Plans: Return home with wife who can be there with him.  His daughter's are coming from [place] to assist if necessary.  Pt is high level will be a short length of stay. Social Work Anticipated Follow Up Needs: HH/OP;Support Group  Clinical Impression Pleasant gentleman who is working through his dizziness and visual issues.  Supportive family who are willing to assist.  Vestibular eval to be completed in the next few days.  Pt is high level Will be short length of stay.  [name] 04/26/2013, 2:11 PM

## 2013-04-26 NOTE — Evaluation (Signed)
Physical Therapy Assessment and Plan  Patient Details  Name: Ryan Jimenez MRN: [id number] Date of Birth: [birth year]  PT Diagnosis: Ataxic gait and Dizziness and giddiness Rehab Potential: Good ELOS: 1 week   Today's Date: 04/26/2013 Time: 1100-1200 Time Calculation (min): 60 min  Problem List:  Patient Active Problem List   Diagnosis Date Noted  . Thrombotic cerebral infarction 04/26/2013  . CVA (cerebral infarction) 04/23/2013  . Hidradenitis suppurativa of right axilla 04/05/2012  . Abscess of axillary region 04/04/2012  . Neutropenia 04/04/2012  . Pancytopenia due to chemotherapy 04/04/2012  . Anemia 04/04/2012  . Generalized weakness 04/04/2012  . Physical deconditioning 04/04/2012  . Benign positional vertigo 11/28/2011  . Hypertension 11/23/2011  . Renal failure 11/23/2011  . Type 2 diabetes mellitus 11/23/2011  . Hypercholesterolemia     Past Medical History:  Past Medical History  Diagnosis Date  . Hypertension   . Diabetes mellitus   . Hypercholesterolemia   . Gout   . Shortness of breath on exertion   . Bronchitis   . Arthritis   . Near syncope 10/2011; 11/2011  . Renal disorder   . CKD (chronic kidney disease)   . Glomerulonephritis   . Pancytopenia   . Anemia    Past Surgical History:  Past Surgical History  Procedure Laterality Date  . Hydrocele excision    . Cataract extraction w/ intraocular lens implant  ~ 2009    right    Assessment & Plan Clinical Impression: Ryan Jimenez is a 79 y.o. right-handed male with history of c-ANCA vasculitis , hypertension, diabetes mellitus with peripheral neuropathy and chronic renal insufficiency with baseline creatinine 1.70. Admitted 04/23/2013 with dizziness and slurred speech. MRI of the brain negative for acute infarct. 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 MRA of the neck with 54% diameter  stenosis proximal right ICA. Echocardiogram with ejection fraction [id number] grade 1 diastolic dysfunction. Patient did not receive TPA. Neurology services consulted placed on Plavix therapy for CVA prophylaxis as well as subcutaneous heparin for DVT prophylaxis. Suspect small right brainstem infarct not seen on MRI felt to be thrombotic secondary small vessel disease. Physical and occupational therapy evaluations completed with recommendations of physical medicine rehabilitation consult.. M.D. is requested physical medicine rehabilitation consult to consider inpatient rehabilitation services. Patient was felt to be a good candidate for inpatient rehabilitation services and was admitted for a comprehensive rehabilitation program on 04/25/2013 .   Patient currently requires min with mobility secondary to visual and vestibular issues, and motor ataxia.  Prior to hospitalization, patient was independent  with mobility and lived with Spouse in a House home.  Home access is 2 (4 steps at back with rails to deck)Stairs to enter.  Patient will benefit from skilled PT intervention to maximize safe functional mobility and minimize fall risk for planned discharge home with intermittent assist.  Anticipate patient will benefit from follow up OP at discharge.  PT - End of Session Activity Tolerance: Tolerates 30+ min activity with multiple rests Endurance Deficit: Yes PT Assessment Rehab Potential: Good Barriers to Discharge: None PT Plan PT Intensity: Minimum of 1-2 x/day ,45 to 90 minutes PT Frequency: 5 out of 7 days PT Duration Estimated Length of Stay: 1 week PT Treatment/Interventions: Ambulation/gait training;Balance/vestibular training;Discharge planning;Disease management/prevention;Functional mobility training;Patient/family education;Stair training;Therapeutic Activities;Therapeutic Exercise;UE/LE Strength taining/ROM;UE/LE Coordination activities PT Recommendation Recommendations for Other Services:  Vestibular eval Follow Up Recommendations:  Outpatient PT Patient destination: Home Equipment Recommended: None recommended by PT (patient has needed equipment)  Skilled Therapeutic Intervention Patient participated in initial evaluation. Patient completed BERG balance test and scored 34/56. Patient ambulated 100 feet with rolling walker and min assist. Patient ambulated up and down 10 steps with min assist and 2 rails. Patient reports dizziness as 6/10. Patient propelled wheelchair 150 feet on level tile with UE's and required several rest breaks due to decreased activity tolerance. Patient left in recliner with items in reach.  PT Evaluation Precautions/Restrictions Precautions Precautions: Fall Precaution Comments: patient complains of visual changes and dizziness Restrictions Weight Bearing Restrictions: No General Chart Reviewed: Yes Family/Caregiver Present: No Vital Signs  Pain Pain Assessment Pain Assessment: No/denies pain Home Living/Prior Functioning Home Living Lives With: Spouse Available Help at Discharge: Family;Available 24 hours/day Type of Home: House Home Access: Stairs to enter [employer] of Steps: 2 (4 steps at back with rails to deck) Entrance Stairs-Rails: None Home Layout: One level Prior Function Level of Independence: Independent with gait Able to Take Stairs?: Yes Driving: Yes Vocation: Retired Radiographer, therapeutic - History Baseline Vision: Wears glasses all the time Patient Visual Report: Blurring of vision;Unable to keep objects in focus  Cognition Overall Cognitive Status: Within Functional Limits for tasks assessed Orientation Level: Oriented X4 Sensation Sensation Light Touch: Appears Intact Stereognosis: Not tested Hot/Cold: Not tested Proprioception: Not tested Coordination Gross Motor Movements are Fluid and Coordinated: No Fine Motor Movements are Fluid and Coordinated: Yes Finger Nose Finger Test: Patient unable  to focus on therapist's finger and tends to over or under shoot Motor  Motor Motor: Within Functional Limits  Mobility Bed Mobility Bed Mobility: Supine to Sit Supine to Sit: 5: Supervision;HOB elevated;With rails Transfers Sit to Stand: 4: Min assist Stand to Sit: 4: Min assist (decreased controll of descent) Locomotion  Ambulation Ambulation: Yes Ambulation/Gait Assistance: 4: Min assist Ambulation Distance (Feet): 199 Feet Assistive device: Rolling walker Ambulation/Gait Assistance Details: cueing for eyes on stationary target due to dizziness Gait Gait: Yes Gait Pattern: Step-through pattern;Trunk flexed;Ataxic;Wide base of support Stairs / Additional Locomotion Stairs: Yes Stairs Assistance: 4: Min assist Stair Management Technique: Two rails;Alternating pattern;Forwards Architect: Yes Distance: 150  Balance Balance Balance Assessed: Yes Standardized Balance Assessment Standardized Balance Assessment: Financial controller Test Sit to Stand: Able to stand  independently using hands Standing Unsupported: Able to stand 2 minutes with supervision Sitting with Back Unsupported but Feet Supported on Floor or Stool: Able to sit safely and securely 2 minutes Stand to Sit: Controls descent by using hands Transfers: Able to transfer with verbal cueing and /or supervision Standing Unsupported with Eyes Closed: Able to stand 10 seconds with supervision Standing Ubsupported with Feet Together: Able to place feet together independently and stand for 1 minute with supervision From Standing, Reach Forward with Outstretched Arm: Can reach forward >5 cm safely (2") From Standing Position, Pick up Object from Floor: Able to pick up shoe, needs supervision From Standing Position, Turn to Look Behind Over each Shoulder: Turn sideways only but maintains balance Turn 360 Degrees: Needs close supervision or verbal cueing Standing Unsupported,  Alternately Place Feet on Step/Stool: Able to complete >2 steps/needs minimal assist Standing Unsupported, One Foot in Front: Able to plae foot ahead of the other independently and hold 30 seconds Standing on One Leg: Tries to lift leg/unable to hold 3 seconds but remains standing independently Total Score: 34 Extremity Assessment  RUE Assessment RUE Assessment:  Within Functional Limits LUE Assessment LUE Assessment: Within Functional Limits RLE Assessment RLE Assessment: Within Functional Limits (right hip flexion 4-/5) LLE Assessment LLE Assessment: Within Functional Limits  FIM:  FIM - Bed/Chair Transfer Bed/Chair Transfer: 5: Supine > Sit: Supervision (verbal cues/safety issues);4: Bed > Chair or W/C: Min A (steadying Pt. > 75%);4: Chair or W/C > Bed: Min A (steadying Pt. > 75%) FIM - Locomotion: Wheelchair Distance: 150 Locomotion: Wheelchair: 5: Travels 150 ft or more: maneuvers on rugs and over door sills with supervision, cueing or coaxing (patient required several rest breaks) FIM - Locomotion: Ambulation Locomotion: Ambulation Assistive Devices: Administrator Ambulation/Gait Assistance: 4: Min assist Locomotion: Ambulation: 2: Travels 50 - 149 ft with minimal assistance (Pt.>75%) FIM - Locomotion: Stairs Locomotion: Scientist, physiological: Hand rail - 2 Locomotion: Stairs: 2: Up and Down 4 - 11 stairs with minimal assistance (Pt.>75%)   Refer to Care Plan for Long Term Goals  Recommendations for other services: Other: vestibular evaluation  Discharge Criteria: Patient will be discharged from PT if patient refuses treatment 3 consecutive times without medical reason, if treatment goals not met, if there is a change in medical status, if patient makes no progress towards goals or if patient is discharged from hospital.  The above assessment, treatment plan, treatment alternatives and goals were discussed and mutually agreed upon: by patient  Sanjuana Letters 04/26/2013, 1:23 PM

## 2013-04-26 NOTE — Care Management Note (Signed)
    Page 1 of 1   04/26/2013     8:15:37 AM   CARE MANAGEMENT NOTE 04/26/2013  Patient:  Ryan Jimenez, Ryan Jimenez   Account Number:  [id number]  Date Initiated:  04/23/2013  Documentation initiated by:  Luz Lex  Subjective/Objective Assessment:   ?? CVA - slurred speech.     Action/Plan:   PT/OT evals- recommended inpatient rehab   Anticipated DC Date:  04/26/2013   Anticipated DC Plan:  [place]  CM consult      Choice offered to / List presented to:             Status of service:  Completed, signed off Medicare Important Message given?   (If response is "NO", the following Medicare IM given date fields will be blank) Date Medicare IM given:   Date Additional Medicare IM given:    Discharge Disposition:  IP REHAB FACILITY  Per UR Regulation:  Reviewed for med. necessity/level of care/duration of stay  If discussed at [place] of Stay Meetings, dates discussed:    Comments:  ContactImraan, Stich Spouse [place]                Kerry, [place] Daughter [phone]                 Parker,Gail Daughter [place] Relative [phone]

## 2013-04-26 NOTE — Care Management Note (Signed)
Inpatient [facility] Individual Statement of Services  Patient Name:  Ryan Jimenez  Date:  04/26/2013  Welcome to the [facility].  Our goal is to provide you with an individualized program based on your diagnosis and situation, designed to meet your specific needs.  With this comprehensive rehabilitation program, you will be expected to participate in at least 3 hours of rehabilitation therapies Monday-Friday, with modified therapy programming on the weekends.  Your rehabilitation program will include the following services:  Physical Therapy (PT), Occupational Therapy (OT), Speech Therapy (ST), 24 hour per day rehabilitation nursing, Case Management ( Social Worker), Rehabilitation Medicine, Nutrition Services and Pharmacy Services  Weekly team conferences will be held on Wednesday to discuss your progress.  Your Social Worker will talk with you frequently to get your input and to update you on team discussions.  Team conferences with you and your family in attendance may also be held.  Expected length of stay: 7 days  Overall anticipated outcome: mod/i level  Depending on your progress and recovery, your program may change. Your Social Worker will coordinate services and will keep you informed of any changes. Your Education officer, museum names and contact numbers are listed  below.  The following services may also be recommended but are not provided by the [place] will be made to provide these services after discharge if needed.  Arrangements include referral to agencies that provide these services.  Your insurance has been verified to be:  Medicare & Commerical Your primary doctor is:  Dr Jeanie Cooks  Pertinent information will be shared with your doctor and your insurance company.  Social Worker:  Ovidio Kin, [place]  Information discussed with and copy given to patient by: [name], 04/26/2013, 10:34 AM

## 2013-04-27 ENCOUNTER — Inpatient Hospital Stay (HOSPITAL_COMMUNITY): Payer: PRIVATE HEALTH INSURANCE | Admitting: Physical Therapy

## 2013-04-27 ENCOUNTER — Inpatient Hospital Stay (HOSPITAL_COMMUNITY): Payer: PRIVATE HEALTH INSURANCE | Admitting: Occupational Therapy

## 2013-04-27 ENCOUNTER — Inpatient Hospital Stay (HOSPITAL_COMMUNITY): Payer: Medicare Other | Admitting: Physical Therapy

## 2013-04-27 ENCOUNTER — Encounter (HOSPITAL_COMMUNITY): Payer: PRIVATE HEALTH INSURANCE | Admitting: Occupational Therapy

## 2013-04-27 DIAGNOSIS — I6330 Cerebral infarction due to thrombosis of unspecified cerebral artery: Secondary | ICD-10-CM

## 2013-04-27 DIAGNOSIS — E119 Type 2 diabetes mellitus without complications: Secondary | ICD-10-CM

## 2013-04-27 DIAGNOSIS — R5381 Other malaise: Secondary | ICD-10-CM

## 2013-04-27 LAB — GLUCOSE, CAPILLARY
Glucose-Capillary: 108 mg/dL — ABNORMAL HIGH (ref 70–99)
Glucose-Capillary: 94 mg/dL (ref 70–99)

## 2013-04-27 NOTE — Progress Notes (Signed)
Physical Therapy Session Note  Patient Details  Name: Ryan Jimenez MRN: [id number] Date of Birth: 03/26/1932  Today's Date: 04/27/2013 Time: [id number] Time Calculation (min): 30 min  Short Term Goals: Week 1:  PT Short Term Goal 1 (Week 1): STG's = LTG's due to LOS  Therapy Documentation Precautions:  Precautions Precautions: Fall Precaution Comments: patient complains of visual changes and dizziness Restrictions Weight Bearing Restrictions: No  Gait Training:(15') using RW 2 x 30' and 2 x 200' with S/Mod-I, no LOB noted. Therapeutic Activity:(15') Dix-Halpike Maneuver and Epley Maneuvers did not increase dizziness or decrease dizziness. Static Gaze shows horizontal nystagmus.     Therapy/Group: Individual Therapy  Clearence Ped 04/27/2013, 2:22 PM

## 2013-04-27 NOTE — Progress Notes (Signed)
Occupational Therapy Session Note  Patient Details  Name: Ryan Jimenez MRN: [id number] Date of Birth: Mar 09, 1932  Today's Date: 04/27/2013 Time: 0830-0900 Time Calculation (min): 30 min  Short Term Goals: Week 1:  OT Short Term Goal 1 (Week 1): STGs=LTGs d/t short ELOS  Skilled Therapeutic Interventions/Progress Updates:    1:1 self care retraining at shower level. Focus on gaze stabilization with target in semi near vision with bed mobility, sit to stands for peri hygiene, in prep for transfers, and clothing management, functional ambulation especially with turns. Pt reports decreased intensity in dizziness with gaze stabilization but more difficult to implement when turning in functional ambulation. Pt reported dizziness no higher than a 5 in the session and this was brought on by turning to sit on tub bench. Performed grooming at sink supervision (with all objects obtained). 2nd half of session continued to focus on gaze stabilization exercises with holding a target still and having pt turn his head to right and left keeping target in focus- pt noted to have slight nystagmus in left eye and difficulty with coordination of left eye often overshooting. Goal for dizziness not succeed 5.  Also performed with target moving slightly from right to left with pt's head remaining still- again more difficult with gaze towards left. At end of session ambulated to bathroom with focus on using gaze stabilization strategies - pt reports dizziness more under control.  Therapy Documentation Precautions:  Precautions Precautions: Fall Precaution Comments: patient complains of visual changes and dizziness Restrictions Weight Bearing Restrictions: No Pain:  no c/o pain just dizziness and blurred vision with mobility   See FIM for current functional status  Therapy/Group: Individual Therapy  Willeen Cass [facility] 04/27/2013, 11:03 AM

## 2013-04-27 NOTE — Progress Notes (Signed)
Occupational Therapy Session Note  Patient Details  Name: Ryan Jimenez MRN: [id number] Date of Birth: 1932/06/13  Today's Date: 04/27/2013 Time: [id number] Time Calculation (min): 40 min  Short Term Goals: Week 1:  OT Short Term Goal 1 (Week 1): STGs=LTGs d/t short ELOS  Skilled Therapeutic Interventions/Progress Updates:    1:1 Continued to work on gaze stabilization exercises with mobility: ie sit to stands, squats down the mat and transfers using a visual target "A" for head/ eye control to decr dizziness. Performed numerous times with dizziness only reaching a 4 (0-10 scale). Performed functional ambulation with target "A" taped to walker as a visual guide. Pt's dizziness remained at a 4. Pt with c/o poor endurance as he got tired going from the gym to the Estate manager/land agent. Pt with improved turns with control with visual target. Discussed goal to progress to finding targets in environment rather than A on target but environment too visual distracting at this point. Continued to practice exercises discussed this am.   Therapy Documentation Precautions:  Precautions Precautions: Fall Precaution Comments: patient complains of visual changes and dizziness Restrictions Weight Bearing Restrictions: No Pain:  no c/o pain  See FIM for current functional status  Therapy/Group: Individual Therapy  Willeen Cass [facility] 04/27/2013, 2:28 PM

## 2013-04-27 NOTE — Progress Notes (Signed)
Patient ID: Ryan Jimenez, male   DOB: Jan 03, 1932, 79 y.o.   MRN: [id number] Subjective/Complaints: 79 y.o. right-handed male with history of c-ANCA vasculitis , hypertension, diabetes mellitus with peripheral neuropathy and chronic renal insufficiency with baseline creatinine 1.70. Admitted 04/23/2013 with dizziness and slurred speech. MRI of the brain negative for acute infarct. 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 MRA of the neck with 54% diameter stenosis proximal right ICA. Echocardiogram with ejection fraction [id number] grade 1 diastolic dysfunction. Patient did not receive TPA. Neurology services consulted placed on Plavix therapy for CVA prophylaxis as well as subcutaneous heparin for DVT prophylaxis. Suspect small right brainstem infarct not seen on MRI felt to be thrombotic secondary small vessel disease.   Feeling dizzy sitting at EOB, no N/V. Symptoms improving slightly  Review of Systems  Gastrointestinal: Negative for nausea, abdominal pain and diarrhea.  Neurological: Positive for dizziness.  All other systems reviewed and are negative.     Objective: Vital Signs: Blood pressure 131/72, pulse 65, temperature 98.1 F (36.7 C), temperature source Oral, resp. rate 19, height 5\' 9"  (1.753 m), weight 97.1 kg (214 lb 1.1 oz), SpO2 99.00%. No results found. Results for orders placed during the hospital encounter of 04/25/13 (from the past 72 hour(s))  CBC     Status: Abnormal   Collection Time    04/25/13  8:39 PM      Result Value Range   WBC 3.4 (*) 4.0 - 10.5 K/uL   RBC 4.05 (*) 4.22 - 5.81 MIL/uL   Hemoglobin 11.6 (*) 13.0 - 17.0 g/dL   HCT 33.7 (*) 39.0 - 52.0 %   MCV 83.2  78.0 - 100.0 fL   MCH 28.6  26.0 - 34.0 pg   MCHC 34.4  30.0 - 36.0 g/dL   RDW 15.2  11.5 - 15.5 %   Platelets 138 (*) 150 - 400 K/uL  CREATININE, SERUM     Status: Abnormal   Collection Time   04/25/13  8:39 PM      Result Value Range   Creatinine, Ser 1.21  0.50 - 1.35 mg/dL   GFR calc non Af Amer 56 (*) >90 mL/min   GFR calc Af Amer 64 (*) >90 mL/min   Comment:            The eGFR has been calculated     using the CKD EPI equation.     This calculation has not been     validated in all clinical     situations.     eGFR's persistently     <90 mL/min signify     possible Chronic Kidney Disease.  GLUCOSE, CAPILLARY     Status: Abnormal   Collection Time    04/25/13  9:24 PM      Result Value Range   Glucose-Capillary 106 (*) 70 - 99 mg/dL  CBC WITH DIFFERENTIAL     Status: Abnormal   Collection Time    04/26/13  5:30 AM      Result Value Range   WBC 3.2 (*) 4.0 - 10.5 K/uL   RBC 4.00 (*) 4.22 - 5.81 MIL/uL   Hemoglobin 11.5 (*) 13.0 - 17.0 g/dL   HCT 33.4 (*) 39.0 - 52.0 %   MCV 83.5  78.0 - 100.0 fL   MCH 28.8  26.0 - 34.0 pg   MCHC 34.4  30.0 - 36.0 g/dL  RDW 15.3  11.5 - 15.5 %   Platelets 153  150 - 400 K/uL   Neutrophils Relative % 58  43 - 77 %   Neutro Abs 1.9  1.7 - 7.7 K/uL   Lymphocytes Relative 20  12 - 46 %   Lymphs Abs 0.6 (*) 0.7 - 4.0 K/uL   Monocytes Relative 14 (*) 3 - 12 %   Monocytes Absolute 0.4  0.1 - 1.0 K/uL   Eosinophils Relative 8 (*) 0 - 5 %   Eosinophils Absolute 0.3  0.0 - 0.7 K/uL   Basophils Relative 1  0 - 1 %   Basophils Absolute 0.0  0.0 - 0.1 K/uL  COMPREHENSIVE METABOLIC PANEL     Status: Abnormal   Collection Time    04/26/13  5:30 AM      Result Value Range   Sodium 141  135 - 145 mEq/L   Potassium 4.3  3.5 - 5.1 mEq/L   Chloride 104  96 - 112 mEq/L   CO2 29  19 - 32 mEq/L   Glucose, Bld 99  70 - 99 mg/dL   BUN 14  6 - 23 mg/dL   Creatinine, Ser 1.36 (*) 0.50 - 1.35 mg/dL   Calcium 9.2  8.4 - 10.5 mg/dL   Total Protein 6.8  6.0 - 8.3 g/dL   Albumin 3.4 (*) 3.5 - 5.2 g/dL   AST 23  0 - 37 U/L   ALT 16  0 - 53 U/L   Alkaline Phosphatase 52  39 - 117 U/L   Total Bilirubin 0.3  0.3 - 1.2 mg/dL   GFR calc non Af  Amer 48 (*) >90 mL/min   GFR calc Af Amer 56 (*) >90 mL/min   Comment:            The eGFR has been calculated     using the CKD EPI equation.     This calculation has not been     validated in all clinical     situations.     eGFR's persistently     <90 mL/min signify     possible Chronic Kidney Disease.  GLUCOSE, CAPILLARY     Status: Abnormal   Collection Time    04/26/13  7:24 AM      Result Value Range   Glucose-Capillary 108 (*) 70 - 99 mg/dL   Comment 1 Notify RN    GLUCOSE, CAPILLARY     Status: Abnormal   Collection Time    04/26/13 11:03 AM      Result Value Range   Glucose-Capillary 132 (*) 70 - 99 mg/dL   Comment 1 Notify RN    GLUCOSE, CAPILLARY     Status: None   Collection Time    04/26/13  4:37 PM      Result Value Range   Glucose-Capillary 83  70 - 99 mg/dL   Comment 1 Notify RN    GLUCOSE, CAPILLARY     Status: Abnormal   Collection Time    04/26/13  8:38 PM      Result Value Range   Glucose-Capillary 131 (*) 70 - 99 mg/dL   Comment 1 Notify RN    GLUCOSE, CAPILLARY     Status: Abnormal   Collection Time    04/27/13  7:40 AM      Result Value Range   Glucose-Capillary 108 (*) 70 - 99 mg/dL   Comment 1 Notify RN        Physical  Exam  Vitals reviewed.  Constitutional: He is oriented to person, place, and time.  HENT:  Head: Normocephalic.  Eyes:  Pupils round and reactive to light  Neck: Normal range of motion. Neck supple. No thyromegaly present.  Cardiovascular: Normal rate and regular rhythm.  Pulmonary/Chest: Effort normal and breath sounds normal. No respiratory distress.  Abdominal: Soft. Bowel sounds are normal. He exhibits no distension.  Musculoskeletal: He exhibits no edema.  Neurological: He is alert and oriented to person, place, and time.  Patient follows three-step commands. His speech is dysarthric but fully intelligible. (baseline per wife). Pt was guarded with scanning to different fields. He guards with gaze to left and  resists tracking in this direction as he feels his "eyes start to twitch" CN 2-12 intact  Visual fields are intact to confrontation testing  Motor: 5/5 in BUE and BLE  Cerebellar: Negative ataxia on FNF testing  Sitting balance is good in bed  Skin: Skin is warm and dry.  Psychiatric: He has a normal mood and affect. His behavior is normal. Judgment and thought content normal    Assessment/Plan: 1. Functional deficits secondary to BPV vs brainstem infarct which require 3+ hours per day of interdisciplinary therapy in a comprehensive inpatient rehab setting. Physiatrist is providing close team supervision and 24 hour management of active medical problems listed below. Physiatrist and rehab team continue to assess barriers to discharge/monitor patient progress toward functional and medical goals. FIM: FIM - Bathing Bathing Steps Patient Completed: Chest;Right Arm;Left Arm;Abdomen;Front perineal area;Buttocks;Right upper leg;Left upper leg Bathing: 4: Min-Patient completes 8-9 30f 10 parts or 75+ percent  FIM - Upper Body Dressing/Undressing Upper body dressing/undressing steps patient completed: Thread/unthread right sleeve of pullover shirt/dresss;Thread/unthread left sleeve of pullover shirt/dress;Put head through opening of pull over shirt/dress;Pull shirt over trunk Upper body dressing/undressing: 5: Set-up assist to: Obtain clothing/put away FIM - Lower Body Dressing/Undressing Lower body dressing/undressing steps patient completed: Don/Doff right sock;Don/Doff left sock;Thread/unthread right underwear leg;Thread/unthread left underwear leg;Pull underwear up/down;Thread/unthread right pants leg;Thread/unthread left pants leg;Pull pants up/down Lower body dressing/undressing: 4: Steadying Assist  FIM - Toileting Toileting steps completed by patient: Adjust clothing prior to toileting;Performs perineal hygiene;Adjust clothing after toileting Toileting Assistive Devices: Grab bar or rail  for support Toileting: 4: Steadying assist  FIM - Radio producer Devices: Grab bars;Walker Toilet Transfers: 4-To toilet/BSC: Min A (steadying Pt. > 75%);4-From toilet/BSC: Min A (steadying Pt. > 75%)  FIM - Bed/Chair Transfer Bed/Chair Transfer: 5: Supine > Sit: Supervision (verbal cues/safety issues);4: Bed > Chair or W/C: Min A (steadying Pt. > 75%);4: Chair or W/C > Bed: Min A (steadying Pt. > 75%)  FIM - Locomotion: Wheelchair Distance: 150 Locomotion: Wheelchair: 5: Travels 150 ft or more: maneuvers on rugs and over door sills with supervision, cueing or coaxing (patient required several rest breaks) FIM - Locomotion: Ambulation Locomotion: Ambulation Assistive Devices: Administrator Ambulation/Gait Assistance: 4: Min assist Locomotion: Ambulation: 2: Travels 50 - 149 ft with minimal assistance (Pt.>75%)  Comprehension Comprehension Mode: Auditory Comprehension: 6-Follows complex conversation/direction: With extra time/assistive device  Expression Expression Mode: Verbal Expression: 6-Expresses complex ideas: With extra time/assistive device  Social Interaction Social Interaction: 6-Interacts appropriately with others with medication or extra time (anti-anxiety, antidepressant).  Problem Solving Problem Solving: 6-Solves complex problems: With extra time  Memory Memory: 6-More than reasonable amt of time  Medical Problem List and Plan:  1. 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 DVT  Prophylaxis/Anticoagulation: Subcutaneous heparin. Monitor platelet counts and any signs of bleeding  3. Pain Management: Tylenol as needed. Monitor with increased mobility  4. Neuropsych: This patient is capable of making decisions on his/her own behalf.  5. ANCA positive vasculitis chronic kidney disease. Followup chemistries continue immunosuppressants. Baseline creatinine 1.70  6. Diabetes mellitus with peripheral  neuropathy. Hemoglobin A1c 6.6. Continue sliding scale insulin. Check blood sugars a.c. and at bedtime. --controlled at present 7.Hypertension. Lasix 40 mg daily, hydralazine 50 mg twice a day, Coreg 12.5 mg twice a day. Improving 8.BPH. Avodart /Flomax. No retention reported  LOS (Days) 2 A FACE TO FACE EVALUATION WAS PERFORMED  SWARTZ,ZACHARY T 04/27/2013, 9:30 AM

## 2013-04-27 NOTE — Progress Notes (Signed)
Physical Therapy Session Note  Patient Details  Name: Ryan Jimenez MRN: [id number] Date of Birth: 11-Jun-1932  Today's Date: 04/27/2013 Time: 0830-0900 Time Calculation (min): 30 min  Short Term Goals: Week 1:  PT Short Term Goal 1 (Week 1): STG's = LTG's due to LOS  Therapy Documentation Precautions:  Precautions Precautions: Fall Precaution Comments: patient complains of visual changes and dizziness Restrictions Weight Bearing Restrictions: No Pain: denies pain  Gait Training:(15') using RW 3 x 160' S/Mod-I with patient focusing attention/eyesight on exit sign down the hall with improved steadiness and decreased reports of dizziness.  Theraputic Exercise:(15') static and dynamic standing exercises.   Therapy/Group: Individual Therapy  Clearence Ped 04/27/2013, 8:35 AM

## 2013-04-27 NOTE — Progress Notes (Signed)
Physical Therapy Session Note  Patient Details  Name: ARKA VEDROS MRN: [id number] Date of Birth: [birth year]  Today's Date: 04/27/2013 Time: 1630-1700 Time Calculation (min): 30 min  Therapy Documentation Precautions:  Precautions Precautions: Fall Precaution Comments: patient complains of visual changes and dizziness Restrictions Weight Bearing Restrictions: No Pain: denies pain  Therapeutic Activity:(30') Ongoing Vestibular Assessment including + Head shake. Also VOR testing was all Negative for increasing report of dizziness except for Head turning R and L while tracking own thumb with outstretched UE. Baseline static gaze shows horizontal Nystagmus which also leads to tracking difficulty and imbalance. Epley and Dix-Halpike Maneuvers did not increase reports of dizziness, in fact lying supine decreases level of dizziness.   See FIM for current functional status  Therapy/Group: Individual Therapy  Clearence Ped 04/27/2013, 5:45 PM

## 2013-04-28 ENCOUNTER — Inpatient Hospital Stay (HOSPITAL_COMMUNITY): Payer: Medicare Other | Admitting: Occupational Therapy

## 2013-04-28 LAB — GLUCOSE, CAPILLARY
Glucose-Capillary: 102 mg/dL — ABNORMAL HIGH (ref 70–99)
Glucose-Capillary: 115 mg/dL — ABNORMAL HIGH (ref 70–99)

## 2013-04-28 NOTE — Progress Notes (Signed)
Occupational Therapy Session Note  Patient Details  Name: Ryan Jimenez MRN: [id number] Date of Birth: 05-Apr-1932  Today's Date: 04/28/2013 Time: [id number] Time Calculation (min): 40 min  Short Term Goals: Week 1:  OT Short Term Goal 1 (Week 1): STGs=LTGs d/t short ELOS  Skilled Therapeutic Interventions/Progress Updates:    Pt seen for ADL retraining with focus on functional mobility, transfers, and gaze stabilization during self-care tasks.  Pt completed bathing at shower level with sit to stand for perineal hygiene.  Pt reports need to toilet, completed toilet transfer with min assist and cues for gaze stabilization during stand pivot. Pt reports dizziness 7/10 when bending forward to attempt donning socks after shower.  Reports no other extreme dizziness with gaze stabilization techniques with ambulation and self-care.  Grooming conducted at sink with setup assist.  Ambulated to recliner with RW and min assist with min cues for gaze stabilization especially with turning.  Therapy Documentation Precautions:  Precautions Precautions: Fall Precaution Comments: patient complains of visual changes and dizziness Restrictions Weight Bearing Restrictions: No Pain:  Pt with no c/o pain this session.  See FIM for current functional status  Therapy/Group: Individual Therapy  Sim Boast 04/28/2013, 11:14 AM

## 2013-04-28 NOTE — Progress Notes (Signed)
Patient ID: Ryan Jimenez, male   DOB: [birth year], 79 y.o.   MRN: [id number] Subjective/Complaints: 79 y.o. right-handed male with history of c-ANCA vasculitis , hypertension, diabetes mellitus with peripheral neuropathy and chronic renal insufficiency with baseline creatinine 1.70. Admitted 04/23/2013 with dizziness and slurred speech. MRI of the brain negative for acute infarct. 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 MRA of the neck with 54% diameter stenosis proximal right ICA. Echocardiogram with ejection fraction [id number] grade 1 diastolic dysfunction. Patient did not receive TPA. Neurology services consulted placed on Plavix therapy for CVA prophylaxis as well as subcutaneous heparin for DVT prophylaxis. Suspect small right brainstem infarct not seen on MRI felt to be thrombotic secondary small vessel disease.   Rested well. No new complaints today.  Review of Systems  Gastrointestinal: Negative for nausea, abdominal pain and diarrhea.  Neurological: Positive for dizziness.  All other systems reviewed and are negative.     Objective: Vital Signs: Blood pressure 118/69, pulse 62, temperature 98.2 F (36.8 C), temperature source Oral, resp. rate 17, height 5\' 9"  (1.753 m), weight 97.1 kg (214 lb 1.1 oz), SpO2 97.00%. No results found. Results for orders placed during the hospital encounter of 04/25/13 (from the past 72 hour(s))  CBC     Status: Abnormal   Collection Time    04/25/13  8:39 PM      Result Value Range   WBC 3.4 (*) 4.0 - 10.5 K/uL   RBC 4.05 (*) 4.22 - 5.81 MIL/uL   Hemoglobin 11.6 (*) 13.0 - 17.0 g/dL   HCT 33.7 (*) 39.0 - 52.0 %   MCV 83.2  78.0 - 100.0 fL   MCH 28.6  26.0 - 34.0 pg   MCHC 34.4  30.0 - 36.0 g/dL   RDW 15.2  11.5 - 15.5 %   Platelets 138 (*) 150 - 400 K/uL  CREATININE, SERUM     Status: Abnormal   Collection Time    04/25/13  8:39 PM   Result Value Range   Creatinine, Ser 1.21  0.50 - 1.35 mg/dL   GFR calc non Af Amer 56 (*) >90 mL/min   GFR calc Af Amer 64 (*) >90 mL/min   Comment:            The eGFR has been calculated     using the CKD EPI equation.     This calculation has not been     validated in all clinical     situations.     eGFR's persistently     <90 mL/min signify     possible Chronic Kidney Disease.  GLUCOSE, CAPILLARY     Status: Abnormal   Collection Time    04/25/13  9:24 PM      Result Value Range   Glucose-Capillary 106 (*) 70 - 99 mg/dL  CBC WITH DIFFERENTIAL     Status: Abnormal   Collection Time    04/26/13  5:30 AM      Result Value Range   WBC 3.2 (*) 4.0 - 10.5 K/uL   RBC 4.00 (*) 4.22 - 5.81 MIL/uL   Hemoglobin 11.5 (*) 13.0 - 17.0 g/dL   HCT 33.4 (*) 39.0 - 52.0 %   MCV 83.5  78.0 - 100.0 fL   MCH 28.8  26.0 - 34.0 pg   MCHC 34.4  30.0 - 36.0 g/dL   RDW 15.3  11.5 -  15.5 %   Platelets 153  150 - 400 K/uL   Neutrophils Relative % 58  43 - 77 %   Neutro Abs 1.9  1.7 - 7.7 K/uL   Lymphocytes Relative 20  12 - 46 %   Lymphs Abs 0.6 (*) 0.7 - 4.0 K/uL   Monocytes Relative 14 (*) 3 - 12 %   Monocytes Absolute 0.4  0.1 - 1.0 K/uL   Eosinophils Relative 8 (*) 0 - 5 %   Eosinophils Absolute 0.3  0.0 - 0.7 K/uL   Basophils Relative 1  0 - 1 %   Basophils Absolute 0.0  0.0 - 0.1 K/uL  COMPREHENSIVE METABOLIC PANEL     Status: Abnormal   Collection Time    04/26/13  5:30 AM      Result Value Range   Sodium 141  135 - 145 mEq/L   Potassium 4.3  3.5 - 5.1 mEq/L   Chloride 104  96 - 112 mEq/L   CO2 29  19 - 32 mEq/L   Glucose, Bld 99  70 - 99 mg/dL   BUN 14  6 - 23 mg/dL   Creatinine, Ser 1.36 (*) 0.50 - 1.35 mg/dL   Calcium 9.2  8.4 - 10.5 mg/dL   Total Protein 6.8  6.0 - 8.3 g/dL   Albumin 3.4 (*) 3.5 - 5.2 g/dL   AST 23  0 - 37 U/L   ALT 16  0 - 53 U/L   Alkaline Phosphatase 52  39 - 117 U/L   Total Bilirubin 0.3  0.3 - 1.2 mg/dL   GFR calc non Af Amer 48 (*) >90 mL/min    GFR calc Af Amer 56 (*) >90 mL/min   Comment:            The eGFR has been calculated     using the CKD EPI equation.     This calculation has not been     validated in all clinical     situations.     eGFR's persistently     <90 mL/min signify     possible Chronic Kidney Disease.  GLUCOSE, CAPILLARY     Status: Abnormal   Collection Time    04/26/13  7:24 AM      Result Value Range   Glucose-Capillary 108 (*) 70 - 99 mg/dL   Comment 1 Notify RN    GLUCOSE, CAPILLARY     Status: Abnormal   Collection Time    04/26/13 11:03 AM      Result Value Range   Glucose-Capillary 132 (*) 70 - 99 mg/dL   Comment 1 Notify RN    GLUCOSE, CAPILLARY     Status: None   Collection Time    04/26/13  4:37 PM      Result Value Range   Glucose-Capillary 83  70 - 99 mg/dL   Comment 1 Notify RN    GLUCOSE, CAPILLARY     Status: Abnormal   Collection Time    04/26/13  8:38 PM      Result Value Range   Glucose-Capillary 131 (*) 70 - 99 mg/dL   Comment 1 Notify RN    GLUCOSE, CAPILLARY     Status: Abnormal   Collection Time    04/27/13  7:40 AM      Result Value Range   Glucose-Capillary 108 (*) 70 - 99 mg/dL   Comment 1 Notify RN    GLUCOSE, CAPILLARY     Status: None  Collection Time    04/27/13 12:09 PM      Result Value Range   Glucose-Capillary 94  70 - 99 mg/dL   Comment 1 Notify RN    GLUCOSE, CAPILLARY     Status: Abnormal   Collection Time    04/27/13  4:56 PM      Result Value Range   Glucose-Capillary 108 (*) 70 - 99 mg/dL   Comment 1 Notify RN    GLUCOSE, CAPILLARY     Status: Abnormal   Collection Time    04/27/13  8:42 PM      Result Value Range   Glucose-Capillary 121 (*) 70 - 99 mg/dL   Comment 1 Notify RN    GLUCOSE, CAPILLARY     Status: Abnormal   Collection Time    04/28/13  7:36 AM      Result Value Range   Glucose-Capillary 102 (*) 70 - 99 mg/dL   Comment 1 Notify RN        Physical Exam  Vitals reviewed.  Constitutional: He is oriented to person,  place, and time.  HENT:  Head: Normocephalic.  Eyes:  Pupils round and reactive to light  Neck: Normal range of motion. Neck supple. No thyromegaly present.  Cardiovascular: Normal rate and regular rhythm.  Pulmonary/Chest: Effort normal and breath sounds normal. No respiratory distress.  Abdominal: Soft. Bowel sounds are normal. He exhibits no distension.  Musculoskeletal: He exhibits no edema.  Neurological: He is alert and oriented to person, place, and time.  Patient follows three-step commands. His speech is dysarthric but fully intelligible. Pt was guarded with scanning to different fields. He guards with gaze to left and resists tracking in this direction as he feels his "eyes start to twitch" CN 2-12 intact  Visual fields are intact to confrontation testing  Motor: 5/5 in BUE and BLE  Cerebellar: Negative ataxia on FNF testing  Sitting balance is good in bed  Skin: Skin is warm and dry.  Psychiatric: He has a normal mood and affect. His behavior is normal. Judgment and thought content normal    Assessment/Plan: 1. Functional deficits secondary to BPV vs brainstem infarct which require 3+ hours per day of interdisciplinary therapy in a comprehensive inpatient rehab setting. Physiatrist is providing close team supervision and 24 hour management of active medical problems listed below. Physiatrist and rehab team continue to assess barriers to discharge/monitor patient progress toward functional and medical goals. FIM: FIM - Bathing Bathing Steps Patient Completed: Chest;Right Arm;Left Arm;Abdomen;Front perineal area;Buttocks;Right upper leg;Left upper leg;Right lower leg (including foot);Left lower leg (including foot) Bathing: 4: Min-Patient completes 8-9 19f 10 parts or 75+ percent  FIM - Upper Body Dressing/Undressing Upper body dressing/undressing steps patient completed: Thread/unthread right sleeve of pullover shirt/dresss;Thread/unthread left sleeve of pullover  shirt/dress;Put head through opening of pull over shirt/dress;Pull shirt over trunk Upper body dressing/undressing: 5: Set-up assist to: Obtain clothing/put away FIM - Lower Body Dressing/Undressing Lower body dressing/undressing steps patient completed: Don/Doff right sock;Don/Doff left sock;Thread/unthread right underwear leg;Thread/unthread left underwear leg;Pull underwear up/down;Thread/unthread right pants leg;Thread/unthread left pants leg;Pull pants up/down;Don/Doff left shoe;Don/Doff right shoe Lower body dressing/undressing: 4: Min-Patient completed 75 plus % of tasks  FIM - Toileting Toileting steps completed by patient: Adjust clothing prior to toileting;Performs perineal hygiene;Adjust clothing after toileting Toileting Assistive Devices: Grab bar or rail for support Toileting: 4: Steadying assist  FIM - Radio producer Devices: Grab bars;Walker Toilet Transfers: 4-To toilet/BSC: Min A (steadying Pt. > 75%);4-From toilet/BSC:  Min A (steadying Pt. > 75%)  FIM - Bed/Chair Transfer Bed/Chair Transfer: 4: Supine > Sit: Min A (steadying Pt. > 75%/lift 1 leg);4: Bed > Chair or W/C: Min A (steadying Pt. > 75%)  FIM - Locomotion: Wheelchair Distance: 150 Locomotion: Wheelchair: 5: Travels 150 ft or more: maneuvers on rugs and over door sills with supervision, cueing or coaxing (patient required several rest breaks) FIM - Locomotion: Ambulation Locomotion: Ambulation Assistive Devices: Administrator Ambulation/Gait Assistance: 4: Min assist Locomotion: Ambulation: 2: Travels 50 - 149 ft with minimal assistance (Pt.>75%)  Comprehension Comprehension Mode: Auditory Comprehension: 6-Follows complex conversation/direction: With extra time/assistive device  Expression Expression Mode: Verbal Expression: 6-Expresses complex ideas: With extra time/assistive device  Social Interaction Social Interaction: 7-Interacts appropriately with others - No  medications needed.  Problem Solving Problem Solving: 5-Solves basic problems: With no assist  Memory Memory: 5-Recognizes or recalls 90% of the time/requires cueing < 10% of the time  Medical Problem List and Plan:  1. 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 DVT Prophylaxis/Anticoagulation: Subcutaneous heparin. Monitor platelet counts and any signs of bleeding  3. Pain Management: Tylenol as needed. Monitor with increased mobility  4. Neuropsych: This patient is capable of making decisions on his/her own behalf.  5. ANCA positive vasculitis chronic kidney disease. Followup chemistries continue immunosuppressants. Baseline creatinine 1.70  6. Diabetes mellitus with peripheral neuropathy. Hemoglobin A1c 6.6. Continue sliding scale insulin. Check blood sugars a.c. and at bedtime. --controlled at present 7.Hypertension. Lasix 40 mg daily, hydralazine 50 mg twice a day, Coreg 12.5 mg twice a day. Improving control 8.BPH. Avodart /Flomax. No retention reported  LOS (Days) 3 A FACE TO FACE EVALUATION WAS PERFORMED  Hajira Verhagen T 04/28/2013, 9:11 AM

## 2013-04-29 ENCOUNTER — Inpatient Hospital Stay (HOSPITAL_COMMUNITY): Payer: Medicare Other

## 2013-04-29 ENCOUNTER — Inpatient Hospital Stay (HOSPITAL_COMMUNITY): Payer: PRIVATE HEALTH INSURANCE | Admitting: Physical Therapy

## 2013-04-29 ENCOUNTER — Inpatient Hospital Stay (HOSPITAL_COMMUNITY): Payer: Medicare Other | Admitting: *Deleted

## 2013-04-29 DIAGNOSIS — E119 Type 2 diabetes mellitus without complications: Secondary | ICD-10-CM

## 2013-04-29 DIAGNOSIS — I6330 Cerebral infarction due to thrombosis of unspecified cerebral artery: Secondary | ICD-10-CM

## 2013-04-29 DIAGNOSIS — R5381 Other malaise: Secondary | ICD-10-CM

## 2013-04-29 NOTE — Progress Notes (Signed)
Occupational Therapy Session Note  Patient Details  Name: Ryan Jimenez MRN: [id number] Date of Birth: 1932/03/24  Today's Date: 04/29/2013 Time: 0730-0828 and [id number] Time Calculation (min): 58 min and 25 min   Short Term Goals: Week 1:  OT Short Term Goal 1 (Week 1): STGs=LTGs d/t short ELOS  Skilled Therapeutic Interventions/Progress Updates:    Session 1: Therapy session focused on ADL retraining, functional transfers, and activity tolerance. Pt ambulated from bed to bathroom with min-supervision using RW and lob laterally to R on 1 occasion. Pt required cues during functional ambulation to slow pace and keep RW on floor. Completed several sit<>stand transfers with min-supervision. Decreased activity tolerance noted as pt required rest breaks after 3-4 sit<>stand transfers. Required cues initially to reach for arm rest before declining into chair however with repetition was able to recall without cues. Completed shower with close supervision on this date. Engaged in dressing task with steadying assist when managing clothing around waist. Good carryover from weekend of focusing on A on RW when reaching down to don/doff socks. Pt reported it helped a lot however had 4-5/10 dizziness throughout session requiring rest breaks during self-care tasks. Pt's son present during 50% of therapy session and reported father's speech was the same as it was before the stroke.   Session 2: Therapy session focused on functional transfers, activity tolerance, carryover of vestibular exercises during functional tasks. Pt received supine in bed with several family members presents. Pt donned shoes with increased time using A on RW to focus on and decrease dizziness. Reported dizziness 3/10 on this date Ambulated with steadying-supervision with RW to ADL apartment. Required cues to slow pace as pt less stable with increased pace. Required rest break d/t fatigue before engaging in tub transfer. Completed 3 tub  transfers with supervision and 1 toilet transfer with supervision with rest breaks between each. Engaged in vestibular exercises while sitting eob. Pt demonstrated recall of habituation exercises. Engaged in habituation exercises moving from resting on elbow of RUE to sitting upright and resting on elbow of LUE before sitting upright. Engaged in VOR x1 exercises. Pt tolerated moving head L<>R approx 40 sec before reporting dizziness of 5/10. Required long rest break to decrease to 4/10 then pt ambulated back to room with steadying assist. Pt reported being very fatigued upon return to room.   Therapy Documentation Precautions:  Precautions Precautions: Fall Precaution Comments: patient complains of visual changes and dizziness Restrictions Weight Bearing Restrictions: No General:   Vital Signs: Therapy Vitals Temp: 98.2 F (36.8 C) Temp src: Oral Pulse Rate: 60 Resp: 17 BP: 129/76 mmHg Patient Position, if appropriate: Lying Oxygen Therapy SpO2: 99 % O2 Device: None (Room air) Pain:   ADL: No c/o pain during therapy sessions.  See FIM for current functional status  Therapy/Group: Individual Therapy  Duayne Cal 04/29/2013, 8:28 AM

## 2013-04-29 NOTE — Progress Notes (Signed)
Patient ID: Ryan Jimenez, male   DOB: 1931/12/16, 79 y.o.   MRN: [id number] Subjective/Complaints: 79 y.o. right-handed male with history of c-ANCA vasculitis , hypertension, diabetes mellitus with peripheral neuropathy and chronic renal insufficiency with baseline creatinine 1.70. Admitted 04/23/2013 with dizziness and slurred speech. MRI of the brain negative for acute infarct. 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 MRA of the neck with 54% diameter stenosis proximal right ICA. Echocardiogram with ejection fraction [id number] grade 1 diastolic dysfunction. Patient did not receive TPA. Neurology services consulted placed on Plavix therapy for CVA prophylaxis as well as subcutaneous heparin for DVT prophylaxis. Suspect small right brainstem infarct not seen on MRI felt to be thrombotic secondary small vessel disease.   Dizziness appears to be constant rather than with movement. No vomiting or nausea. No new movement problems. Mild blurring of vision  Review of Systems  Gastrointestinal: Negative for nausea, abdominal pain and diarrhea.  Neurological: Positive for dizziness.  All other systems reviewed and are negative.     Objective: Vital Signs: Blood pressure 129/76, pulse 60, temperature 98.2 F (36.8 C), temperature source Oral, resp. rate 17, height 5\' 9"  (1.753 m), weight 97.1 kg (214 lb 1.1 oz), SpO2 99.00%. No results found. Results for orders placed during the hospital encounter of 04/25/13 (from the past 72 hour(s))  GLUCOSE, CAPILLARY     Status: Abnormal   Collection Time    04/26/13 11:03 AM      Result Value Range   Glucose-Capillary 132 (*) 70 - 99 mg/dL   Comment 1 Notify RN    GLUCOSE, CAPILLARY     Status: None   Collection Time    04/26/13  4:37 PM      Result Value Range   Glucose-Capillary 83  70 - 99 mg/dL   Comment 1 Notify RN    GLUCOSE, CAPILLARY     Status:  Abnormal   Collection Time    04/26/13  8:38 PM      Result Value Range   Glucose-Capillary 131 (*) 70 - 99 mg/dL   Comment 1 Notify RN    GLUCOSE, CAPILLARY     Status: Abnormal   Collection Time    04/27/13  7:40 AM      Result Value Range   Glucose-Capillary 108 (*) 70 - 99 mg/dL   Comment 1 Notify RN    GLUCOSE, CAPILLARY     Status: None   Collection Time    04/27/13 12:09 PM      Result Value Range   Glucose-Capillary 94  70 - 99 mg/dL   Comment 1 Notify RN    GLUCOSE, CAPILLARY     Status: Abnormal   Collection Time    04/27/13  4:56 PM      Result Value Range   Glucose-Capillary 108 (*) 70 - 99 mg/dL   Comment 1 Notify RN    GLUCOSE, CAPILLARY     Status: Abnormal   Collection Time    04/27/13  8:42 PM      Result Value Range   Glucose-Capillary 121 (*) 70 - 99 mg/dL   Comment 1 Notify RN    GLUCOSE, CAPILLARY     Status: Abnormal   Collection Time    04/28/13  7:36 AM      Result Value Range   Glucose-Capillary 102 (*) 70 - 99 mg/dL   Comment 1 Notify  RN    GLUCOSE, CAPILLARY     Status: Abnormal   Collection Time    04/28/13 11:41 AM      Result Value Range   Glucose-Capillary 103 (*) 70 - 99 mg/dL   Comment 1 Notify RN    GLUCOSE, CAPILLARY     Status: Abnormal   Collection Time    04/28/13  4:56 PM      Result Value Range   Glucose-Capillary 115 (*) 70 - 99 mg/dL   Comment 1 Notify RN    GLUCOSE, CAPILLARY     Status: Abnormal   Collection Time    04/28/13  8:51 PM      Result Value Range   Glucose-Capillary 179 (*) 70 - 99 mg/dL   Comment 1 Notify RN    GLUCOSE, CAPILLARY     Status: Abnormal   Collection Time    04/29/13  7:49 AM      Result Value Range   Glucose-Capillary 127 (*) 70 - 99 mg/dL      Physical Exam  Vitals reviewed.  Constitutional: He is oriented to person, place, and time.  HENT:  Head: Normocephalic.  Eyes:  Pupils round and reactive to light  Neck: Normal range of motion. Neck supple. No thyromegaly present.   Cardiovascular: Normal rate and regular rhythm.  Pulmonary/Chest: Effort normal and breath sounds normal. No respiratory distress.  Abdominal: Soft. Bowel sounds are normal. He exhibits no distension.  Musculoskeletal: He exhibits no edema.  Neurological: He is alert and oriented to person, place, and time.  Patient follows three-step commands. His speech is dysarthric but fully intelligible.  He guards with gaze to left and reports mild blurring of vision with left gaze. No evidence of nystagmus. CN 2-12 intact  Visual fields are intact to confrontation testing  Motor: 5/5 in BUE and BLE  Cerebellar: Negative ataxia on FNF testing  Sitting balance is good in bed  Skin: Skin is warm and dry.  Psychiatric: He has a normal mood and affect. His behavior is normal. Judgment and thought content normal    Assessment/Plan: 1. Functional deficits secondary to BPV vs brainstem infarct which require 3+ hours per day of interdisciplinary therapy in a comprehensive inpatient rehab setting. Physiatrist is providing close team supervision and 24 hour management of active medical problems listed below. Physiatrist and rehab team continue to assess barriers to discharge/monitor patient progress toward functional and medical goals. FIM: FIM - Bathing Bathing Steps Patient Completed: Chest;Right Arm;Left Arm;Abdomen;Front perineal area;Buttocks;Right upper leg;Left upper leg;Right lower leg (including foot);Left lower leg (including foot) Bathing: 5: Set-up assist to: Obtain items  FIM - Upper Body Dressing/Undressing Upper body dressing/undressing steps patient completed: Thread/unthread right sleeve of pullover shirt/dresss;Thread/unthread left sleeve of pullover shirt/dress;Put head through opening of pull over shirt/dress;Pull shirt over trunk Upper body dressing/undressing: 5: Set-up assist to: Obtain clothing/put away FIM - Lower Body Dressing/Undressing Lower body dressing/undressing steps patient  completed: Thread/unthread right underwear leg;Thread/unthread left underwear leg;Pull underwear up/down;Thread/unthread right pants leg;Thread/unthread left pants leg;Pull pants up/down;Don/Doff right sock;Don/Doff left sock Lower body dressing/undressing: 4: Steadying Assist  FIM - Toileting Toileting steps completed by patient: Adjust clothing prior to toileting;Performs perineal hygiene;Adjust clothing after toileting Toileting Assistive Devices: Grab bar or rail for support Toileting: 5: Supervision: Safety issues/verbal cues  FIM - Radio producer Devices: Grab bars Toilet Transfers: 4-From toilet/BSC: Min A (steadying Pt. > 75%);4-To toilet/BSC: Min A (steadying Pt. > 75%)  FIM - Engineer, site  Assistive Devices: Copy: 5: Supine > Sit: Supervision (verbal cues/safety issues);4: Bed > Chair or W/C: Min A (steadying Pt. > 75%)  FIM - Locomotion: Wheelchair Distance: 150 Locomotion: Wheelchair: 5: Travels 150 ft or more: maneuvers on rugs and over door sills with supervision, cueing or coaxing (patient required several rest breaks) FIM - Locomotion: Ambulation Locomotion: Ambulation Assistive Devices: Administrator Ambulation/Gait Assistance: 4: Min assist Locomotion: Ambulation: 2: Travels 50 - 149 ft with minimal assistance (Pt.>75%)  Comprehension Comprehension Mode: Auditory Comprehension: 6-Follows complex conversation/direction: With extra time/assistive device  Expression Expression Mode: Verbal Expression: 6-Expresses complex ideas: With extra time/assistive device  Social Interaction Social Interaction: 7-Interacts appropriately with others - No medications needed.  Problem Solving Problem Solving: 5-Solves basic problems: With no assist  Memory Memory: 5-Recognizes or recalls 90% of the time/requires cueing < 10% of the time  Medical Problem List and Plan:  1. 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 DVT Prophylaxis/Anticoagulation: Subcutaneous heparin. Monitor platelet counts and any signs of bleeding  3. Pain Management: Tylenol as needed. Monitor with increased mobility  4. Neuropsych: This patient is capable of making decisions on his/her own behalf.  5. ANCA positive vasculitis chronic kidney disease. Followup chemistries continue immunosuppressants. Baseline creatinine 1.70  6. Diabetes mellitus with peripheral neuropathy. Hemoglobin A1c 6.6. Continue sliding scale insulin. Check blood sugars a.c. and at bedtime. --controlled at present 7.Hypertension. Lasix 40 mg daily, hydralazine 50 mg twice a day, Coreg 12.5 mg twice a day. Improving control 8.BPH. Avodart /Flomax. No retention reported  LOS (Days) 4 A FACE TO FACE EVALUATION WAS PERFORMED  Alianis Trimmer E 04/29/2013, 9:05 AM

## 2013-04-29 NOTE — Progress Notes (Signed)
Patient information reviewed and entered into eRehab system by Kyzen Horn, RN, CRRN, PPS Coordinator.  Information including medical coding and functional independence measure will be reviewed and updated through discharge.     Per nursing patient was given "Data Collection Information Summary for Patients in Inpatient Rehabilitation Facilities with attached "Privacy Act Statement-Health Care Records" upon admission.  

## 2013-04-29 NOTE — Progress Notes (Signed)
Physical Therapy Session Note  Patient Details  Name: RUSSELL HOUSMAN MRN: [id number] Date of Birth: Oct 04, 1932  Today's Date: 04/29/2013 Time: [id number] Time Calculation (min): 45 min  Short Term Goals: Week 1:  PT Short Term Goal 1 (Week 1): STG's = LTG's due to LOS  Skilled Therapeutic Interventions/Progress Updates:    Patient received supine in bed. Session focused on gait training, stair negotiation, and curb negotiation; see details below. Patient with request to use bathroom at end of session. Patient ambulates to bathroom with RW and min guard and requires min guard for steadying assist when standing. Patient left supine in bed with bed alarm on, all needs within reach, and wife and daughter present.  Therapy Documentation Precautions:  Precautions Precautions: Fall Precaution Comments: patient complains of visual changes and dizziness Restrictions Weight Bearing Restrictions: No Pain: Pain Assessment Pain Assessment: No/denies pain Pain Score: 0-No pain Locomotion : Ambulation Ambulation: Yes Ambulation/Gait Assistance: 4: Min assist;5: Supervision Ambulation Distance (Feet): 175 Feet Assistive device: Rolling walker Ambulation/Gait Assistance Details: Manual facilitation for weight shifting;Verbal cues for gait pattern;Tactile cues for posture Ambulation/Gait Assistance Details: Patient instructed in gait training 175' x2 in controlled and home environments (in hallways and confined spaces of family room) with supervision and one instance of min assist for lateral LOB. Patient requires verbal cues for gaze stabilization during gait training. Patient instructed in gait training 90' x1 wihtout AD and requires min assist with one instance of mod A for lateral LOB. Patient demonstrates wide BOS and decreased step and stride length without AD as compared to with RW. Gait Gait: Yes Gait Pattern: Impaired Gait Pattern: Step-through pattern;Trunk flexed;Ataxic;Wide base of  support Stairs / Additional Locomotion Stairs: Yes Stairs Assistance: 4: Min assist Stairs Assistance Details: Verbal cues for precautions/safety;Verbal cues for gait pattern Stair Management Technique: One rail Right;One rail Left;Step to pattern;Alternating pattern Number of Stairs: 5 Height of Stairs: 6 Curb: 4: Min assist (x2; with RW) Product manager Mobility: No   See FIM for current functional status  Therapy/Group: Individual Therapy  Lillia Abed. Halimah Bewick, PT, DPT  04/29/2013, 4:13 PM

## 2013-04-29 NOTE — Progress Notes (Signed)
Physical Therapy Vestibular Assessment and Plan  Patient Details  Name: Ryan Jimenez MRN: [id number] Date of Birth: 01/22/1932  PT Diagnosis: Dizziness and giddiness, Vertigo and Vertigo of central origin   Today's Date: 04/29/2013 Time: [id number] Time Calculation (min): 55 min  Problem List:  Patient Active Problem List   Diagnosis Date Noted  . Thrombotic cerebral infarction 04/26/2013  . CVA (cerebral infarction) 04/23/2013  . Hidradenitis suppurativa of right axilla 04/05/2012  . Abscess of axillary region 04/04/2012  . Neutropenia 04/04/2012  . Pancytopenia due to chemotherapy 04/04/2012  . Anemia 04/04/2012  . Generalized weakness 04/04/2012  . Physical deconditioning 04/04/2012  . Benign positional vertigo 11/28/2011  . Hypertension 11/23/2011  . Renal failure 11/23/2011  . Type 2 diabetes mellitus 11/23/2011  . Hypercholesterolemia     Past Medical History:  Past Medical History  Diagnosis Date  . Hypertension   . Diabetes mellitus   . Hypercholesterolemia   . Gout   . Shortness of breath on exertion   . Bronchitis   . Arthritis   . Near syncope 10/2011; 11/2011  . Renal disorder   . CKD (chronic kidney disease)   . Glomerulonephritis   . Pancytopenia   . Anemia    Past Surgical History:  Past Surgical History  Procedure Laterality Date  . Hydrocele excision    . Cataract extraction w/ intraocular lens implant  ~ 2009    right    Assessment & Plan Clinical Impression: Ryan Jimenez is a 79 y.o. right-handed male with history of c-ANCA vasculitis , hypertension, diabetes mellitus with peripheral neuropathy and chronic renal insufficiency with baseline creatinine 1.70. Admitted 04/23/2013 with dizziness and slurred speech. MRI of the brain negative for acute infarct. 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 MRA of the neck with 54% diameter  stenosis proximal right ICA. Echocardiogram with ejection fraction [id number] grade 1 diastolic dysfunction. Patient did not receive TPA. Suspect small right brainstem infarct not seen on MRI felt to be thrombotic secondary small vessel disease. Vestibular evaluation requested due to persistent dizziness with mobility.    Pt reports dizziness started with onset of stroke. Pt reports dizziness as the room moving and an overall sense of decreased equilibrium. Pt does not have dizziness with rolling in bed but does during transition to edge of bed and particularly symptomatic with ambulation or when bending over to don socks. Pt baseline dizziness of 4-5/10 however with ambulation will typically go up to 7/10. Pt reports improvement with gaze stabilization activities already implemented by team. Findings from evaluation below:  Eye Alignment WFL  Spontaneous  Nystagmus Positive, Lt. Eye most evident  Gaze holding nystagmus > with looking to Lt.   Smooth pursuit Impaired significantly with pursuit to Lt.   Oculomotor Somewhat impaired Lt. eye  Saccades Slightly impaired per pt report, increased time to focus  VOR slow Significantly impaired.  Head Thrust Test Not tested  Head Shaking Nystagmus Not tested  Rt. Hallpike Dix Tested by previous therapist, Negative and not anticipated to be positive  Lt. Hallpike Dix         "  Rt. Roll Test         "  Lt. Roll Test          "  Motion sensitivity Positive supine > sit; sit > stand; stand pivot transfers; ambulation      Visual- Vestibular Interactions:  -  Sitting: Positive  PT Recommendations: 1] Habituation Exercises: supine<> sit; diagonals head to/from Rt. Knee/Lt. Knee; sit <> stands repeatedly (5 reps at a time x 2-3 sets) while focusing on "A" during transitions (with exception of diagonals then only focus once back up to sitting position). Can progress speed if no longer dizzy, can also progress to harder tasks for pt that are symptom provoking as pt  progresses.   2]Head movements + saccades: pt to practice moving head and focusing on visual targets in preparation for visual targeting with ambulation.   3] VOR x1 exercises. Pt only able to tolerate ~30 sec at a time currently while seated. Needs cues to focus on target at times.   Pt should try to keep dizziness </= 6/10 during exercises and mobility. Slowly progress to learning how to use environmental targets instead of "A". Pt may need target "As" placed in gym, etc for symptom control initially.   Patient will benefit from skilled PT intervention to maximize safe functional mobility, minimize fall risk and decrease caregiver burden. Anticipate patient will benefit from OP Vestibular follow up at discharge.     Skilled Therapeutic Intervention Practiced all exercises listed above under PT recommendations. Cues for appropriate performance of each and discussed progression. Educated pt on monitoring symptoms during exercises and later during the day as symptoms may present with a delay. Pt educated to not perform any sit > stand transitions without therapist and only perform exercises while seated. Pt verbalized understanding. Pt given handout with exercise explanations.   PT Evaluation Pain Pain Assessment Pain Assessment: No/denies pain  Refer to Care Plan for Long Term Goals  Recommendations for other services: None  Discharge Criteria: Patient will be discharged from PT if patient refuses treatment 3 consecutive times without medical reason, if treatment goals not met, if there is a change in medical status, if patient makes no progress towards goals or if patient is discharged from hospital.  The above assessment, treatment plan, treatment alternatives and goals were discussed and mutually agreed upon: by patient  Ryan Jimenez 04/29/2013, 12:22 PM

## 2013-04-30 ENCOUNTER — Inpatient Hospital Stay (HOSPITAL_COMMUNITY): Payer: Medicare Other | Admitting: *Deleted

## 2013-04-30 ENCOUNTER — Inpatient Hospital Stay (HOSPITAL_COMMUNITY): Payer: Medicare Other

## 2013-04-30 LAB — GLUCOSE, CAPILLARY
Glucose-Capillary: 149 mg/dL — ABNORMAL HIGH (ref 70–99)
Glucose-Capillary: 183 mg/dL — ABNORMAL HIGH (ref 70–99)
Glucose-Capillary: 90 mg/dL (ref 70–99)
Glucose-Capillary: 125 mg/dL — ABNORMAL HIGH (ref 70–99)
Glucose-Capillary: 156 mg/dL — ABNORMAL HIGH (ref 70–99)

## 2013-04-30 NOTE — Progress Notes (Signed)
Occupational Therapy Session Note  Patient Details  Name: Ryan Jimenez MRN: [id number] Date of Birth: July 17, 1932  Today's Date: 04/30/2013 Time: 0730-0827 and [id number] Time Calculation (min): 57 min and 45 min   Short Term Goals: Week 1:  OT Short Term Goal 1 (Week 1): STGs=LTGs d/t short ELOS  Skilled Therapeutic Interventions/Progress Updates:    Session 1: Therapy session focused on functional transfers, ADL retraining, dynamic standing balance, activity tolerance, and vestibular exercises. Pt close supervision for self-care tasks and functional transfers this AM. Pt reported "feeling much better then yesterday" and had less dizziness. Dizziness rated 3/10 consistently throughout therapy session. Increased standing tolerance to approx 5 min at sink while completing shaving task and oral care. Pt required 4 rest breaks during self-care tasks d/t fatigue. Pt reports blurriness when tracking to L side and reports more severe in L inferior. Completed habituation exercises while eob. Completed diagonals and supine<>sit 3 sets of 5 reps each and dizziness remained 3/10.   Session 2: Therapy session focused on functional transfers, activity tolerance, carryover of gaze stabilization tech in busy environment, and vestibular habituation exercises during functional tasks. Pt completed tub tx at Mod I level on this date. Pt ambulated with supervision from room to therapy gym without rest break and back to room at end of session without rest break. Engaged in diagonal habituation exercises while in standing reaching from each side then going across body to place overhead with close supervision for standing balance. Complete 4 sets of 5 reps with increased dizziness noted when beginning on L side. Pt demonstrated good carryover of gaze stabilization tech as he was able to locate and verbalize several items in crowded gym that he could focus on to relieve dizziness. Engaged in similar activity in sitting as  he reached to floor to place item in diagonal that was placed beside him. Pt with increased dizziness when starting on L side however compensated well by taking frequent rest breaks to focus on target. Pt with much slower movements when beginning on L side as well. Pt with consistent report of dizziness 2-3/10 throughout session. Educated pt on keeping self-care items in higher drawers so he would not have to consistently reach low to retrieve items. Teachback used as pt able to verbalize 3 low surfaces he commonly reaches to and where he will move them to decrease dizziness.   Therapy Documentation Precautions:  Precautions Precautions: Fall Precaution Comments: patient complains of visual changes and dizziness Restrictions Weight Bearing Restrictions: No General:   Vital Signs: Therapy Vitals Temp: 98.1 F (36.7 C) Temp src: Oral Pulse Rate: 62 Resp: 17 BP: 106/78 mmHg Patient Position, if appropriate: Sitting Oxygen Therapy SpO2: 98 % O2 Device: None (Room air) Pain:   No c/o pain.  See FIM for current functional status  Therapy/Group: Individual Therapy  Duayne Cal 04/30/2013, 8:27 AM

## 2013-04-30 NOTE — Plan of Care (Signed)
Problem: RH Stairs Goal: LTG Patient will ambulate up and down stairs w/assist (PT) LTG: Patient will ambulate up and down # of stairs with assistance (PT)  Patient reports that entrance with 4 steps and one handrail is in back of house and he does not use it. Will use entrance at front of house with 2 steps to enter and 0 handrails (will use RW).

## 2013-04-30 NOTE — Progress Notes (Signed)
Patient ID: Ryan Jimenez, male   DOB: 1932-10-04, 79 y.o.   MRN: [id number] Subjective/Complaints: 79 y.o. right-handed male with history of c-ANCA vasculitis , hypertension, diabetes mellitus with peripheral neuropathy and chronic renal insufficiency with baseline creatinine 1.70. Admitted 04/23/2013 with dizziness and slurred speech. MRI of the brain negative for acute infarct. 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 MRA of the neck with 54% diameter stenosis proximal right ICA. Echocardiogram with ejection fraction [id number] grade 1 diastolic dysfunction. Patient did not receive TPA. Neurology services consulted placed on Plavix therapy for CVA prophylaxis as well as subcutaneous heparin for DVT prophylaxis. Suspect small right brainstem infarct not seen on MRI felt to be thrombotic secondary small vessel disease.   Dizziness appears to be constant rather than with movement. No vomiting or nausea. No new movement problems. Mild blurring of vision  Review of Systems  Gastrointestinal: Negative for nausea, abdominal pain and diarrhea.  Neurological: Positive for dizziness.  All other systems reviewed and are negative.     Objective: Vital Signs: Blood pressure 106/78, pulse 62, temperature 98.1 F (36.7 C), temperature source Oral, resp. rate 17, height 5\' 9"  (1.753 m), weight 97.1 kg (214 lb 1.1 oz), SpO2 98.00%. No results found. Results for orders placed during the hospital encounter of 04/25/13 (from the past 72 hour(s))  GLUCOSE, CAPILLARY     Status: None   Collection Time    04/27/13 12:09 PM      Result Value Range   Glucose-Capillary 94  70 - 99 mg/dL   Comment 1 Notify RN    GLUCOSE, CAPILLARY     Status: Abnormal   Collection Time    04/27/13  4:56 PM      Result Value Range   Glucose-Capillary 108 (*) 70 - 99 mg/dL   Comment 1 Notify RN    GLUCOSE, CAPILLARY     Status:  Abnormal   Collection Time    04/27/13  8:42 PM      Result Value Range   Glucose-Capillary 121 (*) 70 - 99 mg/dL   Comment 1 Notify RN    GLUCOSE, CAPILLARY     Status: Abnormal   Collection Time    04/28/13  7:36 AM      Result Value Range   Glucose-Capillary 102 (*) 70 - 99 mg/dL   Comment 1 Notify RN    GLUCOSE, CAPILLARY     Status: Abnormal   Collection Time    04/28/13 11:41 AM      Result Value Range   Glucose-Capillary 103 (*) 70 - 99 mg/dL   Comment 1 Notify RN    GLUCOSE, CAPILLARY     Status: Abnormal   Collection Time    04/28/13  4:56 PM      Result Value Range   Glucose-Capillary 115 (*) 70 - 99 mg/dL   Comment 1 Notify RN    GLUCOSE, CAPILLARY     Status: Abnormal   Collection Time    04/28/13  8:51 PM      Result Value Range   Glucose-Capillary 179 (*) 70 - 99 mg/dL   Comment 1 Notify RN    GLUCOSE, CAPILLARY     Status: Abnormal   Collection Time    04/29/13  7:49 AM      Result Value Range   Glucose-Capillary 127 (*) 70 - 99 mg/dL  GLUCOSE, CAPILLARY  Status: Abnormal   Collection Time    04/29/13 11:19 AM      Result Value Range   Glucose-Capillary 118 (*) 70 - 99 mg/dL  GLUCOSE, CAPILLARY     Status: Abnormal   Collection Time    04/29/13  4:38 PM      Result Value Range   Glucose-Capillary 107 (*) 70 - 99 mg/dL  GLUCOSE, CAPILLARY     Status: Abnormal   Collection Time    04/29/13  9:20 PM      Result Value Range   Glucose-Capillary 149 (*) 70 - 99 mg/dL      Physical Exam  Vitals reviewed.  Constitutional: He is oriented to person, place, and time.  HENT:  Head: Normocephalic.  Eyes:  Pupils round and reactive to light  Neck: Normal range of motion. Neck supple. No thyromegaly present.  Cardiovascular: Normal rate and regular rhythm.  Pulmonary/Chest: Effort normal and breath sounds normal. No respiratory distress.  Abdominal: Soft. Bowel sounds are normal. He exhibits no distension.  Musculoskeletal: He exhibits no edema.   Neurological: He is alert and oriented to person, place, and time.  Patient follows three-step commands. His speech is dysarthric but fully intelligible.  He guards with gaze to left and reports mild blurring of vision with left gaze. No evidence of nystagmus. CN 2-12 intact  Visual fields are intact to confrontation testing  Motor: 5/5 in BUE and BLE  Cerebellar: Negative ataxia on FNF testing  Sitting balance is good in bed  Skin: Skin is warm and dry.  Psychiatric: He has a normal mood and affect. His behavior is normal. Judgment and thought content normal    Assessment/Plan: 1. Functional deficits secondary to BPV vs brainstem infarct which require 3+ hours per day of interdisciplinary therapy in a comprehensive inpatient rehab setting. Physiatrist is providing close team supervision and 24 hour management of active medical problems listed below. Physiatrist and rehab team continue to assess barriers to discharge/monitor patient progress toward functional and medical goals. FIM: FIM - Bathing Bathing Steps Patient Completed: Chest;Right Arm;Left Arm;Abdomen;Front perineal area;Buttocks;Right upper leg;Left upper leg;Right lower leg (including foot);Left lower leg (including foot) Bathing: 5: Set-up assist to: Obtain items  FIM - Upper Body Dressing/Undressing Upper body dressing/undressing steps patient completed: Thread/unthread right sleeve of pullover shirt/dresss;Thread/unthread left sleeve of pullover shirt/dress;Put head through opening of pull over shirt/dress;Pull shirt over trunk Upper body dressing/undressing: 5: Set-up assist to: Obtain clothing/put away FIM - Lower Body Dressing/Undressing Lower body dressing/undressing steps patient completed: Thread/unthread right underwear leg;Thread/unthread left underwear leg;Pull underwear up/down;Thread/unthread right pants leg;Thread/unthread left pants leg;Pull pants up/down;Don/Doff right sock;Don/Doff left sock Lower body  dressing/undressing: 5: Set-up assist to: Don/Doff TED stocking  FIM - Toileting Toileting steps completed by patient: Performs perineal hygiene;Adjust clothing after toileting Toileting Assistive Devices: Grab bar or rail for support Toileting: 5: Supervision: Safety issues/verbal cues  FIM - Radio producer Devices: Grab bars Toilet Transfers: 5-To toilet/BSC: Supervision (verbal cues/safety issues);5-From toilet/BSC: Supervision (verbal cues/safety issues)  FIM - Engineer, site Assistive Devices: Walker;HOB elevated Bed/Chair Transfer: 5: Supine > Sit: Supervision (verbal cues/safety issues)  FIM - Locomotion: Wheelchair Distance: 150 Locomotion: Wheelchair: 1: Total Assistance/staff pushes wheelchair (Pt<25%) FIM - Locomotion: Ambulation Locomotion: Ambulation Assistive Devices: Administrator Ambulation/Gait Assistance: 4: Min assist;5: Supervision Locomotion: Ambulation: 4: Travels 150 ft or more with minimal assistance (Pt.>75%)  Comprehension Comprehension Mode: Auditory Comprehension: 6-Follows complex conversation/direction: With extra time/assistive device  Expression Expression Mode:  Verbal Expression: 6-Expresses complex ideas: With extra time/assistive device  Social Interaction Social Interaction: 7-Interacts appropriately with others - No medications needed.  Problem Solving Problem Solving: 5-Solves basic problems: With no assist  Memory Memory: 5-Recognizes or recalls 90% of the time/requires cueing < 10% of the time  Medical Problem List and Plan:  1. 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 DVT Prophylaxis/Anticoagulation: Subcutaneous heparin. Monitor platelet counts and any signs of bleeding  3. Pain Management: Tylenol as needed. Monitor with increased mobility  4. Neuropsych: This patient is capable of making decisions on his/her own behalf.  5. ANCA positive  vasculitis chronic kidney disease. Followup chemistries continue immunosuppressants. Baseline creatinine 1.70  6. Diabetes mellitus with peripheral neuropathy. Hemoglobin A1c 6.6. Continue sliding scale insulin. Check blood sugars a.c. and at bedtime. --controlled at present 7.Hypertension. Lasix 40 mg daily, hydralazine 50 mg twice a day, Coreg 12.5 mg twice a day. Improving control 8.BPH. Avodart /Flomax. No retention reported  LOS (Days) 5 A FACE TO FACE EVALUATION WAS PERFORMED  Dani Wallner E 04/30/2013, 8:58 AM

## 2013-04-30 NOTE — Progress Notes (Signed)
Physical Therapy Session Note  Patient Details  Name: Ryan Jimenez MRN: [id number] Date of Birth: [birth year]  Today's Date: 04/30/2013 Time: [id number] and 1130-1200 Time Calculation (min): 57 min and 30 min  Short Term Goals: Week 1:  PT Short Term Goal 1 (Week 1): STG's = LTG's due to LOS  Skilled Therapeutic Interventions/Progress Updates:    First session: Patient received supine in bed. Session focused on community integration with gait training in community environments and on uneven surfaces (see details below). Education provided throughout session about management of dizziness and gaze stabilization to be performed in community setting. Patient with request to use bathroom at end of session. Patient ambulated to bathroom with RW and supervision and required supervision for toileting. No c/o dizziness with mobility throughout whole session.  Second session: Patient received supine in bed. Session focused on dynamic standing balance and increasing activity tolerance. See details below for dynamic standing balance activity (watering plants). Patient exercised on NuStep Level 6 x3' then Level 4 x5' with B UE and B LE to increase strength, endurance, and coordination through reciprocal movements. Patient with request to use bathroom at end of session. Patient ambulated to bathroom with RW and supervision and required supervision for toileting. No c/o dizziness with mobility throughout whole session.  Therapy Documentation Precautions:  Precautions Precautions: Fall Precaution Comments: patient complains of visual changes and dizziness Restrictions Weight Bearing Restrictions: No Pain: Pain Assessment Pain Assessment: No/denies pain Pain Score: 0-No pain Patients Stated Pain Goal: 3 Multiple Pain Sites: No Locomotion : Ambulation Ambulation: Yes Ambulation/Gait Assistance: 5: Supervision Ambulation Distance (Feet): >300 Feet Assistive device: Rolling walker Ambulation/Gait  Assistance Details: Verbal cues for precautions/safety; Verbal cues for gaze stabilization during mobility, however no c/o dizziness with any mobility throughout both sessions. Ambulation/Gait Assistance Details: First Session: Patient instructed in gait training >300' x4 in community environment (hospital lobby and outside, on/off elevator) and on uneven surfaces (grass, brick, concrete, carpet, inclines/declines) with RW. Patient requires verbal cues when negotiating thresholds/changes in surface or cracks in pavement. Patient also instructed in gait training in home environment >30' with RW. Patient required to negotiate obstalces in confined spaces and ambulate on carpet. Patient performed gait training 54' x2 without AD and min A; noted wide BOS and quick pace without RW. Second Session: Patient instructed in gait training 200' x2 with RW and supervision. Gait Gait: Yes Gait Pattern: Step-through pattern;Trunk flexed;Wide base of support Stairs / Additional Locomotion Stairs: Yes Stairs Assistance: 5: Supervision Stairs Assistance Details: Verbal cues for sequencing Stair Management Technique: No rails;With walker;Forwards;Step to pattern Number of Stairs: 2 Height of Stairs: 6 Wheelchair Mobility Wheelchair Mobility: No  Balance: Balance Balance Assessed: Yes Dynamic Standing Balance Dynamic Standing - Balance Support: No upper extremity supported;During functional activity Dynamic Standing - Level of Assistance: 5: Stand by assistance Dynamic Standing - Balance Activities: Lateral lean/weight shifting;Forward lean/weight shifting;Reaching for objects;Reaching for weighted objects;Reaching across midline Dynamic Standing - Comments: Patient held watering can and watered plants in day room (one trip to the sink x15' each direction to refill can). Patient requires supervision for activity.  See FIM for current functional status  Therapy/Group: Individual Therapy  Lillia Abed. Otho Michalik, PT, DPT 04/30/2013, 11:54 AM

## 2013-05-01 ENCOUNTER — Inpatient Hospital Stay (HOSPITAL_COMMUNITY): Payer: Medicare Other

## 2013-05-01 ENCOUNTER — Inpatient Hospital Stay (HOSPITAL_COMMUNITY): Payer: Medicare Other | Admitting: *Deleted

## 2013-05-01 LAB — GLUCOSE, CAPILLARY: Glucose-Capillary: 120 mg/dL — ABNORMAL HIGH (ref 70–99)

## 2013-05-01 MED ORDER — GLUCERNA SHAKE PO LIQD
120.0000 mL | Freq: Three times a day (TID) | ORAL | Status: DC
  Administered 2013-05-01 (×2): 120 mL via ORAL

## 2013-05-01 NOTE — Progress Notes (Signed)
Social Work Patient ID: Ryan Jimenez, male   DOB: 1931/12/06, 79 y.o.   MRN: [id number] Met with pt to inform team conference goals-mod/i level and discharge 6/19.  He is pleased with his progress and feels so much better, his dizziness is gone. Discussed OP therapies and has all DME.  Will work toward discharge tomorrow.

## 2013-05-01 NOTE — Progress Notes (Signed)
Occupational Therapy Discharge Summary  Patient Details  Name: Ryan Jimenez MRN: [id number] Date of Birth: 1932-03-15  Today's Date: 05/01/2013  Patient has met 8 of 8 long term goals due to improved activity tolerance, improved balance, postural control, ability to compensate for deficits, improved awareness and improved coordination.  Patient to discharge at overall Modified Independent level.  Patient's care partner is not necessary secondary to patient discharging at Mod I level to provide the necessary assistance assistance at discharge.    Reasons goals not met: N/A as pt met all LTGs  Recommendation:  Patient will benefit from ongoing skilled OT services in n/a as pt discharging at Mod I level and does not required further skilled OT services.  to continue to advance functional skills in the area of n/a .  Equipment: Pt has all necessary equipment  Reasons for discharge: treatment goals met  Patient/family agrees with progress made and goals achieved: Yes  OT Discharge Precautions/Restrictions    General Amount of Missed OT Time (min): 6 Minutes Vital Signs Therapy Vitals Temp: 98.2 F (36.8 C) Temp src: Oral Pulse Rate: 70 Resp: 18 BP: 126/72 mmHg Patient Position, if appropriate: Lying Oxygen Therapy SpO2: 98 % O2 Device: None (Room air) Pain   ADL   Vision/Perception     Cognition   Sensation   Motor    Mobility     Trunk/Postural Assessment     Balance   Extremity/Trunk Assessment      See FIM for current functional status  [name], Quillian Quince 05/01/2013, 3:40 PM

## 2013-05-01 NOTE — Progress Notes (Signed)
Patient ID: Ryan Jimenez, male   DOB: September 12, 1932, 79 y.o.   MRN: [id number] Subjective/Complaints: 79 y.o. right-handed male with history of c-ANCA vasculitis , hypertension, diabetes mellitus with peripheral neuropathy and chronic renal insufficiency with baseline creatinine 1.70. Admitted 04/23/2013 with dizziness and slurred speech. MRI of the brain negative for acute infarct. 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 MRA of the neck with 54% diameter stenosis proximal right ICA. Echocardiogram with ejection fraction [id number] grade 1 diastolic dysfunction. Patient did not receive TPA. Neurology services consulted placed on Plavix therapy for CVA prophylaxis as well as subcutaneous heparin for DVT prophylaxis. Suspect small right brainstem infarct not seen on MRI felt to be thrombotic secondary small vessel disease.   Dizziness "90% better"  Hope to go home this week. No vomiting or nausea. No new movement problems. Mild blurring of vision  Review of Systems  Gastrointestinal: Negative for nausea, abdominal pain and diarrhea.  Neurological: Positive for dizziness.  All other systems reviewed and are negative.     Objective: Vital Signs: Blood pressure 133/79, pulse 74, temperature 97.5 F (36.4 C), temperature source Oral, resp. rate 18, height 5\' 9"  (1.753 m), weight 94.5 kg (208 lb 5.4 oz), SpO2 97.00%. No results found. Results for orders placed during the hospital encounter of 04/25/13 (from the past 72 hour(s))  GLUCOSE, CAPILLARY     Status: Abnormal   Collection Time    04/28/13 11:41 AM      Result Value Range   Glucose-Capillary 103 (*) 70 - 99 mg/dL   Comment 1 Notify RN    GLUCOSE, CAPILLARY     Status: Abnormal   Collection Time    04/28/13  4:56 PM      Result Value Range   Glucose-Capillary 115 (*) 70 - 99 mg/dL   Comment 1 Notify RN    GLUCOSE, CAPILLARY     Status:  Abnormal   Collection Time    04/28/13  8:51 PM      Result Value Range   Glucose-Capillary 179 (*) 70 - 99 mg/dL   Comment 1 Notify RN    GLUCOSE, CAPILLARY     Status: Abnormal   Collection Time    04/29/13  7:49 AM      Result Value Range   Glucose-Capillary 127 (*) 70 - 99 mg/dL  GLUCOSE, CAPILLARY     Status: Abnormal   Collection Time    04/29/13 11:19 AM      Result Value Range   Glucose-Capillary 118 (*) 70 - 99 mg/dL  GLUCOSE, CAPILLARY     Status: Abnormal   Collection Time    04/29/13  4:38 PM      Result Value Range   Glucose-Capillary 107 (*) 70 - 99 mg/dL  GLUCOSE, CAPILLARY     Status: Abnormal   Collection Time    04/29/13  9:20 PM      Result Value Range   Glucose-Capillary 149 (*) 70 - 99 mg/dL  GLUCOSE, CAPILLARY     Status: Abnormal   Collection Time    04/30/13  9:13 AM      Result Value Range   Glucose-Capillary 183 (*) 70 - 99 mg/dL  GLUCOSE, CAPILLARY     Status: None   Collection Time    04/30/13 12:04 PM      Result Value Range   Glucose-Capillary 90  70 - 99 mg/dL  GLUCOSE, CAPILLARY     Status: Abnormal   Collection Time    04/30/13  4:55 PM      Result Value Range   Glucose-Capillary 125 (*) 70 - 99 mg/dL  GLUCOSE, CAPILLARY     Status: Abnormal   Collection Time    04/30/13  8:36 PM      Result Value Range   Glucose-Capillary 156 (*) 70 - 99 mg/dL  GLUCOSE, CAPILLARY     Status: Abnormal   Collection Time    05/01/13  7:38 AM      Result Value Range   Glucose-Capillary 120 (*) 70 - 99 mg/dL      Physical Exam  Vitals reviewed.  Constitutional: He is oriented to person, place, and time.  HENT:  Head: Normocephalic.  Eyes:  Pupils round and reactive to light  Neck: Normal range of motion. Neck supple. No thyromegaly present.  Cardiovascular: Normal rate and regular rhythm.  Pulmonary/Chest: Effort normal and breath sounds normal. No respiratory distress.  Abdominal: Soft. Bowel sounds are normal. He exhibits no distension.   Musculoskeletal: He exhibits no edema.  Neurological: He is alert and oriented to person, place, and time.  Patient follows three-step commands. His speech is dysarthric but fully intelligible.  He guards with gaze to left and reports no blurring of vision with left gaze. No evidence of nystagmus. CN 2-12 intact  Visual fields are intact to confrontation testing  Motor: 5/5 in BUE and BLE  Cerebellar: Negative ataxia on FNF testing  Sitting balance is good in bed  Skin: Skin is warm and dry.  Psychiatric: He has a normal mood and affect. His behavior is normal. Judgment and thought content normal    Assessment/Plan: 1. Functional deficits secondary to BPV vs brainstem infarct which require 3+ hours per day of interdisciplinary therapy in a comprehensive inpatient rehab setting. Physiatrist is providing close team supervision and 24 hour management of active medical problems listed below. Physiatrist and rehab team continue to assess barriers to discharge/monitor patient progress toward functional and medical goals. Team conference today please see physician documentation under team conference tab, met with team face-to-face to discuss problems,progress, and goals. Formulized individual treatment plan based on medical history, underlying problem and comorbidities. FIM: FIM - Bathing Bathing Steps Patient Completed: Chest;Right Arm;Left Arm;Abdomen;Front perineal area;Buttocks;Right upper leg;Left upper leg;Right lower leg (including foot);Left lower leg (including foot) Bathing: 6: Assistive device (Comment) (TTB)  FIM - Upper Body Dressing/Undressing Upper body dressing/undressing steps patient completed: Thread/unthread right sleeve of pullover shirt/dresss;Thread/unthread left sleeve of pullover shirt/dress;Put head through opening of pull over shirt/dress;Pull shirt over trunk Upper body dressing/undressing: 6: Assistive device (Comment) (used RW to retrieve clothing) FIM - Lower Body  Dressing/Undressing Lower body dressing/undressing steps patient completed: Thread/unthread right underwear leg;Thread/unthread left underwear leg;Pull underwear up/down;Thread/unthread right pants leg;Thread/unthread left pants leg;Pull pants up/down;Don/Doff right sock;Don/Doff left sock Lower body dressing/undressing: 6: More than reasonable amount of time (used RW to retrieve clothing)  FIM - Toileting Toileting steps completed by patient: Performs perineal hygiene;Adjust clothing after toileting;Adjust clothing prior to toileting Toileting Assistive Devices: Grab bar or rail for support Toileting: 6: More than reasonable amount of time  FIM - Radio producer Devices: Grab bars;Walker Toilet Transfers: 6-More than reasonable amt of time;6-To toilet/ BSC;6-From toilet/BSC  FIM - Control and instrumentation engineer Devices: Copy: 6: More than reasonable amt of time;6: Sit > Supine: No assist  FIM - Locomotion: Wheelchair Distance: 150 Locomotion: Wheelchair:  0: Activity did not occur FIM - Locomotion: Ambulation Locomotion: Ambulation Assistive Devices: Walker - Rolling Ambulation/Gait Assistance: 6: Modified independent (Device/Increase time) Locomotion: Ambulation: 5: Travels 150 ft or more with supervision/safety issues  Comprehension Comprehension Mode: Auditory Comprehension: 7-Follows complex conversation/direction: With no assist  Expression Expression Mode: Verbal Expression: 7-Expresses complex ideas: With no assist  Social Interaction Social Interaction: 7-Interacts appropriately with others - No medications needed.  Problem Solving Problem Solving: 7-Solves complex problems: Recognizes & self-corrects  Memory Memory: 7-Complete Independence: No helper  Medical Problem List and Plan:  1. 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 DVT Prophylaxis/Anticoagulation:D/C  Subcutaneous heparin.Ambulates 150 feet   3. Pain Management: Tylenol as needed. Monitor with increased mobility  4. Neuropsych: This patient is capable of making decisions on his/her own behalf.  5. ANCA positive vasculitis chronic kidney disease. Followup chemistries continue immunosuppressants. Baseline creatinine 1.70  6. Diabetes mellitus with peripheral neuropathy. Hemoglobin A1c 6.6. Continue sliding scale insulin. Check blood sugars a.c. and at bedtime. --controlled at present 7.Hypertension. Lasix 40 mg daily, hydralazine 50 mg twice a day, Coreg 12.5 mg twice a day. Improving control 8.BPH. Avodart /Flomax. No retention reported  LOS (Days) 6 A FACE TO [place] E 05/01/2013, 10:07 AM

## 2013-05-01 NOTE — Progress Notes (Signed)
Occupational Therapy Session Note  Patient Details  Name: Ryan Jimenez MRN: [id number] Date of Birth: [birth year]  Today's Date: 05/01/2013 Time: [id number] and 1136-1200 and 1345-1430 Time Calculation (min): 55 min and 24 min and 45 min   Short Term Goals: Week 1:  OT Short Term Goal 1 (Week 1): STGs=LTGs d/t short ELOS  Skilled Therapeutic Interventions/Progress Updates:    Session 1: Therapy session focused on safety with functional transfers, dynamic standing balance, and ADL retraining. Pt completed functional transfers and self-care tasks at Mod I level on this date. Pt Mod I in controlled environment on this date as he demonstrated good safety throughout session and recalled needing to slow pace during functional ambulation. Good carryover with vestibular and gaze stabilization during self-care tasks. Pt practiced retrieving clothing from low drawers and able to do so with dizziness remaining 2/10. Reported dizziness 2/10 consistently throughout session. Pt demonstrating good energy conservation techniques and able to appropriately determine when he needs rest breaks.   Session 2: Therapy session focused on BUE coordination, activity tolerance, safety, and standing balance. Pt ambulated from room to therapy gym at Mod I level demonstrating good safety and slowed pace. Pt engaged in Nu Step workload 4 for 8 min with 4 rest breaks secondary to fatigue. Engaged in pipe maze activity in sitting and standing. Pt with good BUE coordination and no overshooting or undershooting during task noted. Pt ambulated back to room and adjusted bed to straighten sheets. No lob during this task.   Session 3: Therapy session focused on dynamic standing balance, awareness, safety, and activity tolerance during community re-entry task. Pt able to navigate to locate lobby and outside after being told which floor to go to on elevator. Ambulated outside with RW and required long rest break. Pt then ambulated down  slope to come into different entrance of hospital. Required 2 verbal cues for safety as pt steering RW wheels on edge of curb and grass. Pt required long rest break once inside and verbal cues to relocate room as pt had never been in this part of hospital. Pt very fatigued at end of session.  Therapy Documentation Precautions:  Precautions Precautions: Fall Precaution Comments: patient complains of visual changes and dizziness however reports improvement and demonstrates good compensation Restrictions Weight Bearing Restrictions: No General:   Vital Signs: Therapy Vitals Temp: 97.5 F (36.4 C) Temp src: Oral Pulse Rate: 74 Resp: 18 BP: 133/79 mmHg Patient Position, if appropriate: Lying Oxygen Therapy SpO2: 97 % O2 Device: None (Room air) Pain: No c/o pain during therapy session.   See FIM for current functional status  Therapy/Group: Individual Therapy  Duayne Cal 05/01/2013, 9:27 AM

## 2013-05-01 NOTE — Patient Care Conference (Signed)
Inpatient RehabilitationTeam Conference and Plan of Care Update Date: 05/01/2013   Time: 11:30 AM    Patient Name: Ryan Jimenez      Medical Record Number: [id number]  Date of Birth: January 07, 1932 Sex: Male         Room/Bed: 4151/4151-01 Payor Info: Payor: MEDICARE / Plan: MEDICARE PART A AND B / Product Type: *No Product type* /    Admitting Diagnosis: R BRAINSTEM CVA  Admit Date/Time:  04/25/2013  6:37 PM Admission Comments: No comment available   Primary Diagnosis:  Thrombotic cerebral infarction Principal Problem: Thrombotic cerebral infarction  Patient Active Problem List   Diagnosis Date Noted  . Thrombotic cerebral infarction 04/26/2013  . CVA (cerebral infarction) 04/23/2013  . Hidradenitis suppurativa of right axilla 04/05/2012  . Abscess of axillary region 04/04/2012  . Neutropenia 04/04/2012  . Pancytopenia due to chemotherapy 04/04/2012  . Anemia 04/04/2012  . Generalized weakness 04/04/2012  . Physical deconditioning 04/04/2012  . Benign positional vertigo 11/28/2011  . Hypertension 11/23/2011  . Renal failure 11/23/2011  . Type 2 diabetes mellitus 11/23/2011  . Hypercholesterolemia     Expected Discharge Date: Expected Discharge Date: 05/02/13  Team Members Present: Physician leading conference: Dr. Alysia Penna Social Worker Present: Ovidio Kin, LCSW Nurse Present: Other (comment) Pryor Montes Hicks-RN) PT Present: Raylene Everts, PT;Caroline Lacinda Axon, PT;Other (comment) Shelton Silvas Ripa-PT) OT Present: Other (comment);Sim Boast, Lorelee Cover, OT (Kayla Perkinson_OT) SLP Present: Gunnar Fusi, SLP Other (Discipline and Name): Lelan Pons Noel_PPS     Current Status/Progress Goal Weekly Team Focus  Medical   Dizziness and left-sided visual complaints improving  Upgrade to modified independent status  Discharge planning   Bowel/Bladder   continent of bowel and bladder, uses urinal  continent with toileting  up to bathroom for toileting   Swallow/Nutrition/  Hydration   CMM  75% of meal trays      ADL's   supervision overall with self-care tasks and functional transfers; pt reports dizziness has improved   Mod I goals  dynamic standing balance, functional transfers, activity tolerance, NMR, pt education   Mobility   S all mobility  mod I overall, S stairs  safety, vestibular exercises, gait, transfers, bed mobility, strengthening, balance, activity tolerance, coordination   Communication     na        Safety/Cognition/ Behavioral Observations  bed alarm in use  no falls with injury      Pain   no c/o of pain  0-3 on scale of 1-10      Skin   intact  no breakdown         *See Care Plan and progress notes for long and short-term goals.  Barriers to Discharge: Some balance issues remain    Possible Resolutions to Barriers:  Continue rehabilitation, evaluate equipment    Discharge Planning/Teaching Needs:  Home with wife and daughter's who are here form [place] to assist  diabeties management/treatment- pt was taking novolog at home if blood glucose over 185, checks Blood glucose 2-4 times a day, high blood pressure mamangement/treatment   Team Discussion:  Reaching mod/i level-dizziness resolving.  Had vestibular eval here.  Recommend OP vestibular rehab upon discharge  Revisions to Treatment Plan:  None   Continued Need for Acute Rehabilitation Level of Care: The patient requires daily medical management by a physician with specialized training in physical medicine and rehabilitation for the following conditions: Daily direction of a multidisciplinary physical rehabilitation program to ensure safe treatment while eliciting the highest outcome that is of  practical value to the patient.: Yes Daily medical management of patient stability for increased activity during participation in an intensive rehabilitation regime.: Yes Daily analysis of laboratory values and/or radiology reports with any subsequent need for medication adjustment of  medical intervention for : Neurological problems  [name] 05/01/2013, 3:33 PM

## 2013-05-01 NOTE — Progress Notes (Addendum)
Physical Therapy Discharge Summary  Patient Details  Name: Ryan Jimenez MRN: [id number] Date of Birth: [birth year]  Today's Date: 05/01/2013 Time: [id number] Time Calculation (min): 57 min  Patient has met 9 of 9 long term goals due to improved activity tolerance, improved balance, improved postural control, ability to compensate for deficits, improved awareness and improved coordination.  Patient to discharge at an ambulatory level Modified Independent.   Patient's care partner is not necessary secondary to discharging at mod I level to provide the necessary assistance at discharge. Discussion with patient's wife and daughter that supervision (cues to "slow down") is to be provided during stair negotiation; both verbalized understanding.   Reasons goals not met: N/A, all LTGs met  Recommendation:  Patient will benefit from ongoing skilled PT services in outpatient setting to continue to advance safe functional mobility, address ongoing impairments in balance, activity tolerance, strength, gait impairments, and minimize fall risk.  Equipment: No equipment provided  Reasons for discharge: treatment goals met and discharge from hospital  Patient/family agrees with progress made and goals achieved: Yes  Patient made mod I in controlled environment of his room with use of RW to ambulate to/from bathroom and toilet himself. RN aware.  PT Discharge Precautions/Restrictions Precautions Precautions: None Precaution Comments: patient complains of visual changes and dizziness however reports improvement and demonstrates good compensation Restrictions Weight Bearing Restrictions: No Pain Pain Assessment Pain Assessment: No/denies pain Pain Score: 0-No pain Vision/Perception  Vision - History Baseline Vision: Wears glasses all the time Visual History: Corrective eye surgery (B cataracts surgery) Patient Visual Report: Blurring of vision Perception Perception: Within Functional  Limits Praxis Praxis: Intact  Cognition Overall Cognitive Status: Within Functional Limits for tasks assessed Arousal/Alertness: Awake/alert Orientation Level: Oriented X4 Attention: Selective Sustained Attention: Appears intact Selective Attention: Appears intact Memory: Appears intact Awareness: Appears intact Problem Solving: Appears intact Safety/Judgment: Appears intact Sensation Sensation Light Touch: Appears Intact Stereognosis: Not tested Hot/Cold: Not tested Proprioception: Appears Intact Additional Comments: Proprioception intact at B ankles and great toes. Coordination Gross Motor Movements are Fluid and Coordinated: Yes Fine Motor Movements are Fluid and Coordinated: Yes Coordination and Movement Description: Improved speed and accuracy with rapid alternating movements Finger Nose Finger Test: improved speed and accuracy  Heel Shin Test: No dysmetria noted B LE Motor  Motor Motor: Within Functional Limits Motor - Discharge Observations: No clonus noted B LE  Mobility Bed Mobility Bed Mobility: Supine to Sit;Sit to Supine Supine to Sit: 7: Independent;HOB flat Sit to Supine: 7: Independent;HOB flat Transfers Sit to Stand: 6: Modified independent (Device/Increase time);With armrests;From bed;From chair/3-in-1;With upper extremity assist Stand to Sit: 6: Modified independent (Device/Increase time);With upper extremity assist;With armrests;To bed;To chair/3-in-1 Car Transfer: 6: Modified independent (Device/Increase time); With rolling walker Locomotion  Ambulation Ambulation: Yes Ambulation/Gait Assistance: 6: Modified independent (Device/Increase time) Ambulation Distance (Feet): 300 Feetx3 Assistive device: Rolling walker Ambulation/Gait Assistance Details: Patient instructed in gait training >300' x3 in controlled, home, and community environments (busy hospital hallways, ADL apartment, hospital lobby and gift shop, on/off elevator) and on various surfaces  (tile, carpet, etc.). Gait Gait: Yes Gait Pattern: Step-through pattern;Trunk flexed;Wide base of support Stairs / Additional Locomotion Stairs: Yes Stairs Assistance: 5: Supervision Stairs Assistance Details: Verbal cues for precautions/safety Stairs Assistance Details (indicate cue type and reason): Verbal cues for appropriate pacing as patient tends to rush. Stair Management Technique: No rails;With walker;Forwards;Step to pattern Number of Stairs: 2 Height of Stairs: 6 Curb: 5: Supervision (with RW) Wheelchair Mobility Wheelchair Mobility: No  Trunk/Postural Assessment  Cervical Assessment Cervical Assessment: Within Functional Limits Thoracic Assessment Thoracic Assessment: Within Functional Limits Lumbar Assessment Lumbar Assessment: Within Functional Limits Postural Control Postural Control: Within Functional Limits  Balance Balance Balance Assessed: Yes Standardized Balance Assessment Standardized Balance Assessment: Berg Balance Test Berg Balance Test Sit to Stand: Able to stand without using hands and stabilize independently Standing Unsupported: Able to stand safely 2 minutes Sitting with Back Unsupported but Feet Supported on Floor or Stool: Able to sit safely and securely 2 minutes Stand to Sit: Sits safely with minimal use of hands Transfers: Able to transfer safely, definite need of hands Standing Unsupported with Eyes Closed: Able to stand 10 seconds safely Standing Ubsupported with Feet Together: Able to place feet together independently and stand 1 minute safely From Standing, Reach Forward with Outstretched Arm: Can reach forward >12 cm safely (5") From Standing Position, Pick up Object from Floor: Able to pick up shoe safely and easily From Standing Position, Turn to Look Behind Over each Shoulder: Looks behind one side only/other side shows less weight shift Turn 360 Degrees: Able to turn 360 degrees safely but slowly Standing Unsupported, Alternately  Place Feet on Step/Stool: Able to stand independently and safely and complete 8 steps in 20 seconds Standing Unsupported, One Foot in Front: Able to plae foot ahead of the other independently and hold 30 seconds Standing on One Leg: Tries to lift leg/unable to hold 3 seconds but remains standing independently Total Score: 47 Extremity Assessment  RUE Assessment RUE Assessment: Within Functional Limits LUE Assessment LUE Assessment: Within Functional Limits RLE Assessment RLE Assessment: Within Functional Limits LLE Assessment LLE Assessment: Within Functional Limits  See FIM for current functional status  Loran Auguste S Katanya Schlie S. Aveer Bartow, PT, DPT 05/01/2013, 11:09 AM

## 2013-05-01 NOTE — Social Work (Signed)
[name], LCSW Social Worker Signed  Patient Care Conference Service date: 05/01/2013 3:33 PM  Inpatient RehabilitationTeam Conference and Plan of Care Update Date: 05/01/2013   Time: 11:30 AM     Patient Name: Ryan Jimenez       Medical Record Number: [id number]   Date of Birth: [birth year] Sex: Male         Room/Bed: 4151/4151-01 Payor Info: Payor: MEDICARE / Plan: MEDICARE PART A AND B / Product Type: *No Product type* /   Admitting Diagnosis: R BRAINSTEM CVA   Admit Date/Time:  04/25/2013  6:37 PM Admission Comments: No comment available   Primary Diagnosis:  Thrombotic cerebral infarction Principal Problem: Thrombotic cerebral infarction    Patient Active Problem List     Diagnosis  Date Noted   .  Thrombotic cerebral infarction  04/26/2013   .  CVA (cerebral infarction)  04/23/2013   .  Hidradenitis suppurativa of right axilla  04/05/2012   .  Abscess of axillary region  04/04/2012   .  Neutropenia  04/04/2012   .  Pancytopenia due to chemotherapy  04/04/2012   .  Anemia  04/04/2012   .  Generalized weakness  04/04/2012   .  Physical deconditioning  04/04/2012   .  Benign positional vertigo  11/28/2011   .  Hypertension  11/23/2011   .  Renal failure  11/23/2011   .  Type 2 diabetes mellitus  11/23/2011   .  Hypercholesterolemia       Expected Discharge Date: Expected Discharge Date: 05/02/13  Team Members Present: Physician leading conference: Dr. Alysia Penna Social Worker Present: Ovidio Kin, LCSW Nurse Present: Other (comment) Pryor Montes Hicks-RN) PT Present: Raylene Everts, PT;Caroline Lacinda Axon, PT;Other (comment) Shelton Silvas Ripa-PT) OT Present: Other (comment);Sim Boast, Lorelee Cover, OT (Kayla Perkinson_OT) SLP Present: Gunnar Fusi, SLP Other (Discipline and Name): Lelan Pons Noel_PPS        Current Status/Progress  Goal  Weekly Team Focus   Medical     Dizziness and left-sided visual complaints improving  Upgrade to modified independent status   Discharge planning   Bowel/Bladder     continent of bowel and bladder, uses urinal  continent with toileting  up to bathroom for toileting   Swallow/Nutrition/ Hydration     CMM  75% of meal trays     ADL's     supervision overall with self-care tasks and functional transfers; pt reports dizziness has improved   Mod I goals  dynamic standing balance, functional transfers, activity tolerance, NMR, pt education   Mobility     S all mobility  mod I overall, S stairs  safety, vestibular exercises, gait, transfers, bed mobility, strengthening, balance, activity tolerance, coordination   Communication     na       Safety/Cognition/ Behavioral Observations    bed alarm in use  no falls with injury     Pain     no c/o of pain  0-3 on scale of 1-10     Skin     intact  no breakdown       *See Care Plan and progress notes for long and short-term goals.    Barriers to Discharge:  Some balance issues remain      Possible Resolutions to Barriers:    Continue rehabilitation, evaluate equipment      Discharge Planning/Teaching Needs:    Home with wife and daughter's who are here form [place] to assist   diabeties management/treatment- pt was taking novolog at  home if blood glucose over 185, checks Blood glucose 2-4 times a day, high blood pressure mamangement/treatment    Team Discussion:    Reaching mod/i level-dizziness resolving.  Had vestibular eval here.  Recommend OP vestibular rehab upon discharge   Revisions to Treatment Plan:    None    Continued Need for Acute Rehabilitation Level of Care: The patient requires daily medical management by a physician with specialized training in physical medicine and rehabilitation for the following conditions: Daily direction of a multidisciplinary physical rehabilitation program to ensure safe treatment while eliciting the highest outcome that is of practical value to the patient.: Yes Daily medical management of patient stability for increased  activity during participation in an intensive rehabilitation regime.: Yes Daily analysis of laboratory values and/or radiology reports with any subsequent need for medication adjustment of medical intervention for : Neurological problems  [name] 05/01/2013, 3:33 PM

## 2013-05-02 DIAGNOSIS — R5381 Other malaise: Secondary | ICD-10-CM

## 2013-05-02 DIAGNOSIS — E119 Type 2 diabetes mellitus without complications: Secondary | ICD-10-CM

## 2013-05-02 DIAGNOSIS — I6330 Cerebral infarction due to thrombosis of unspecified cerebral artery: Secondary | ICD-10-CM

## 2013-05-02 LAB — GLUCOSE, CAPILLARY: Glucose-Capillary: 119 mg/dL — ABNORMAL HIGH (ref 70–99)

## 2013-05-02 MED ORDER — TAMSULOSIN HCL 0.4 MG PO CAPS: 0.4000 mg | ORAL_CAPSULE | Freq: Every day | ORAL | Status: AC

## 2013-05-02 MED ORDER — HYDRALAZINE HCL 100 MG PO TABS: 50.0000 mg | ORAL_TABLET | Freq: Two times a day (BID) | ORAL | Status: DC

## 2013-05-02 MED ORDER — COLCHICINE 0.6 MG PO TABS: 0.6000 mg | ORAL_TABLET | Freq: Two times a day (BID) | ORAL | Status: DC | PRN

## 2013-05-02 MED ORDER — ROSUVASTATIN CALCIUM 10 MG PO TABS: 10.0000 mg | ORAL_TABLET | Freq: Every day | ORAL | Status: DC

## 2013-05-02 MED ORDER — DICLOFENAC SODIUM 1 % TD GEL: 4.0000 g | Freq: Two times a day (BID) | TRANSDERMAL | Status: DC

## 2013-05-02 MED ORDER — POTASSIUM CHLORIDE CRYS ER 20 MEQ PO TBCR: 40.0000 meq | EXTENDED_RELEASE_TABLET | Freq: Every day | ORAL | Status: DC

## 2013-05-02 MED ORDER — DUTASTERIDE 0.5 MG PO CAPS: 0.5000 mg | ORAL_CAPSULE | Freq: Every day | ORAL | Status: DC

## 2013-05-02 MED ORDER — CLOPIDOGREL BISULFATE 75 MG PO TABS: 75.0000 mg | ORAL_TABLET | Freq: Every day | ORAL | Status: DC

## 2013-05-02 MED ORDER — CARVEDILOL 12.5 MG PO TABS: 12.5000 mg | ORAL_TABLET | Freq: Two times a day (BID) | ORAL | Status: AC

## 2013-05-02 MED ORDER — FUROSEMIDE 40 MG PO TABS: 40.0000 mg | ORAL_TABLET | Freq: Every day | ORAL | Status: DC

## 2013-05-02 NOTE — Progress Notes (Signed)
Pt given discharge instructions by Marlowe Shores, PA with family at bedside with verbal understanding. All questions answered by PA, SW, and nursing. Belongings with family and pt. Pt discharged at 1045 to home.

## 2013-05-02 NOTE — Progress Notes (Signed)
Social Work Patient ID: Ryan Jimenez, male   DOB: Jan 20, 1932, 79 y.o.   MRN: [id number] Met with pt and wife who report both daughter's have gone back to [place].  Wife doesn't drive, so will switch to home health for a short time. Referral made to [facility] to provide HHPT, [place].  All pleased with change in plan.

## 2013-05-02 NOTE — Discharge Summary (Signed)
[name], ROLL NO.:  [id number]  MEDICAL RECORD NO.:  [id number]  LOCATION:  [id number]                         FACILITY:  [facility]  PHYSICIAN:  Ryan Jimenez, Ryan JimenezDATE OF BIRTH:  1932-10-22  DATE OF ADMISSION:  04/25/2013 DATE OF DISCHARGE:  05/02/2013                              DISCHARGE SUMMARY   DISCHARGE DIAGNOSES: 1. Small brainstem cerebrovascular accident felt to be thrombotic     secondary to small vessel disease. 2. Subcutaneous heparin for deep vein thrombosis prophylaxis. 3. Pain management. 4. 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 5. Diabetes mellitus. 6. Peripheral neuropathy. 7. Hypertension. 8. Benign prostatic hypertrophy.  HISTORY OF PRESENT ILLNESS:  This is a 79 year old right-handed male with history of positive ANCA vasculitis, diabetes mellitus, who was admitted on April 23, 2013, with dizziness and slurred speech.  MRI of the brain negative for acute infarction.  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  MRA of the neck with 54% diameter stenosis, proximal right ICA.  Echocardiogram with ejection fraction of [id number], grade 1 diastolic dysfunction.  The patient did not receive tPA.  Neurology Service was consulted, placed on Plavix therapy for CVA prophylaxis as well as subcutaneous heparin for DVT prophylaxis. Suspect small right brainstem infarction not seen on MRI felt to be thrombotic secondary to small vessel disease.  Physical and occupational therapy ongoing.  The patient was admitted for comprehensive rehab program.  PAST MEDICAL HISTORY:  See discharge diagnoses.  SOCIAL HISTORY:  Lives with spouse.  FUNCTIONAL HISTORY:  Prior to admission was independent, retired.  FUNCTIONAL STATUS UPON ADMISSION TO REHAB SERVICES:  Minimal assist to ambulate 90 feet with a rolling walker.  PHYSICAL  EXAMINATION:  VITAL SIGNS:  Blood pressure 157/75, pulse 66, temperature 98.4. GENERAL:  This was an alert male, oriented x3.  Pupils are round and reactive to light. LUNGS:  Clear to auscultation. CARDIAC:  Regular rate and rhythm. ABDOMEN:  Soft, nontender.  Good bowel sounds.  REHABILITATION HOSPITAL COURSE:  The patient was admitted to Inpatient Rehab Services with therapies initiated on a 3-hour daily basis consisting of physical therapy, occupational therapy, and rehabilitation nursing.  The following issues were addressed during the patient's rehabilitation stay.  Pertaining to [name] small brainstem CVA felt to be thrombotic secondary to small vessel disease remained stable, maintained on Plavix therapy.  The patient would follow up with Neurology Services.  The patient had been on subcutaneous heparin for DVT prophylaxis, later discontinued as he was ambulating greater than 150 feet.  He did have a history of positive ANCA vasculitis chronic kidney disease.  Chemistries remained stable as he continued with immunosuppressants, baseline creatinine 1.70.  He did have a history of diabetes mellitus, peripheral neuropathy, diet controlled.  Latest hemoglobin A1c of 6.6.  Blood pressures monitored with no orthostatic changes, on Lasix, hydralazine as well as Coreg.  He would follow up with his primary Ryan Jimenez.  He did have a history of benign prostatic hypertrophy as he remained on Avodart as well as Flomax.  He was voiding without difficulty.  The patient received weekly collaborative  interdisciplinary team conferences to discuss estimated length of stay, family teaching, and any barriers to discharge.  He was supervision overall with self-care task and functional transfers, supervision overall for functional mobility.  He was advised no driving.  His strength had greatly improved.  Full family teaching was completed with his wife.  Plan was to be discharged to  home.  DISCHARGE MEDICATIONS: 1. Lipitor 20 mg p.o. daily. 2. Imuran 100 mg p.o. daily. 3. Coreg 12.5 mg b.i.d. 4. Plavix 75 mg p.o. daily. 5. Colchicine 0.6 mg twice daily as needed. 6. Cytoxan 50 mg p.o. daily. 7. Voltaren gel twice daily to affected area. 8. Avodart 0.5 mg p.o. daily. 9. Lasix 40 mg p.o. daily. 10.Hydralazine 50 mg p.o. b.i.d. 11.Lactinex 1 tablet p.o. t.i.d. 12.Potassium chloride 40 mEq p.o. daily. 13.Flomax 0.4 mg p.o. at bedtime. 14.Vitamin D 50,000 units every Sunday.  DIET:  Diabetic diet.  SPECIAL INSTRUCTIONS:  The patient would follow up with Dr. Jeanie Cooks, medical management, appointment to be made; Dr. Alysia Penna, at the outpatient rehab center as directed; Dr. Antony Contras, Neurology Services, 1 month, call for appointment.     Ryan Jimenez, P.A.   ______________________________ Ryan Jimenez, M.D.   DA/MEDQ  D:  05/02/2013  T:  05/02/2013  Job:  [id number]  cc:   Ryan P. Leonie Man, Ryan Jimenez Ryan Jimenez, M.D.

## 2013-05-02 NOTE — Progress Notes (Signed)
Patient ID: Ryan Jimenez, male   DOB: 1932-10-10, 79 y.o.   MRN: [id number] Subjective/Complaints: 79 y.o. right-handed male with history of c-ANCA vasculitis , hypertension, diabetes mellitus with peripheral neuropathy and chronic renal insufficiency with baseline creatinine 1.70. Admitted 04/23/2013 with dizziness and slurred speech. MRI of the brain negative for acute infarct. 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 MRA of the neck with 54% diameter stenosis proximal right ICA. Echocardiogram with ejection fraction [id number] grade 1 diastolic dysfunction. Patient did not receive TPA. Neurology services consulted placed on Plavix therapy for CVA prophylaxis as well as subcutaneous heparin for DVT prophylaxis. Suspect small right brainstem infarct not seen on MRI felt to be thrombotic secondary small vessel disease.   Dizziness better, excited about D/C  Review of Systems  Gastrointestinal: Negative for nausea, abdominal pain and diarrhea.  Neurological: Positive for dizziness.  All other systems reviewed and are negative.     Objective: Vital Signs: Blood pressure 113/70, pulse 60, temperature 98 F (36.7 C), temperature source Oral, resp. rate 17, height 5\' 9"  (1.753 m), weight 94.5 kg (208 lb 5.4 oz), SpO2 98.00%. No results found. Results for orders placed during the hospital encounter of 04/25/13 (from the past 72 hour(s))  GLUCOSE, CAPILLARY     Status: Abnormal   Collection Time    04/29/13 11:19 AM      Result Value Range   Glucose-Capillary 118 (*) 70 - 99 mg/dL  GLUCOSE, CAPILLARY     Status: Abnormal   Collection Time    04/29/13  4:38 PM      Result Value Range   Glucose-Capillary 107 (*) 70 - 99 mg/dL  GLUCOSE, CAPILLARY     Status: Abnormal   Collection Time    04/29/13  9:20 PM      Result Value Range   Glucose-Capillary 149 (*) 70 - 99 mg/dL  GLUCOSE, CAPILLARY      Status: Abnormal   Collection Time    04/30/13  9:13 AM      Result Value Range   Glucose-Capillary 183 (*) 70 - 99 mg/dL  GLUCOSE, CAPILLARY     Status: None   Collection Time    04/30/13 12:04 PM      Result Value Range   Glucose-Capillary 90  70 - 99 mg/dL  GLUCOSE, CAPILLARY     Status: Abnormal   Collection Time    04/30/13  4:55 PM      Result Value Range   Glucose-Capillary 125 (*) 70 - 99 mg/dL  GLUCOSE, CAPILLARY     Status: Abnormal   Collection Time    04/30/13  8:36 PM      Result Value Range   Glucose-Capillary 156 (*) 70 - 99 mg/dL  GLUCOSE, CAPILLARY     Status: Abnormal   Collection Time    05/01/13  7:38 AM      Result Value Range   Glucose-Capillary 120 (*) 70 - 99 mg/dL  GLUCOSE, CAPILLARY     Status: Abnormal   Collection Time    05/01/13 11:24 AM      Result Value Range   Glucose-Capillary 128 (*) 70 - 99 mg/dL  GLUCOSE, CAPILLARY     Status: Abnormal   Collection Time    05/01/13  4:44 PM      Result Value Range   Glucose-Capillary 109 (*) 70 - 99 mg/dL  GLUCOSE, CAPILLARY  Status: Abnormal   Collection Time    05/01/13  8:46 PM      Result Value Range   Glucose-Capillary 199 (*) 70 - 99 mg/dL  GLUCOSE, CAPILLARY     Status: Abnormal   Collection Time    05/02/13  7:16 AM      Result Value Range   Glucose-Capillary 119 (*) 70 - 99 mg/dL      Physical Exam  Vitals reviewed.  Constitutional: He is oriented to person, place, and time.  HENT:  Head: Normocephalic.  Eyes:  Pupils round and reactive to light  Neck: Normal range of motion. Neck supple. No thyromegaly present.  Cardiovascular: Normal rate and regular rhythm.  Pulmonary/Chest: Effort normal and breath sounds normal. No respiratory distress.  Abdominal: Soft. Bowel sounds are normal. He exhibits no distension.  Musculoskeletal: He exhibits no edema.  Neurological: He is alert and oriented to person, place, and time.  Patient follows three-step commands. His speech is  dysarthric but fully intelligible.  He guards with gaze to left and reports no blurring of vision with left gaze. No evidence of nystagmus. CN 2-12 intact  Visual fields are intact to confrontation testing  Motor: 5/5 in BUE and BLE  Cerebellar: Negative ataxia on FNF testing  Sitting balance is good in bed  Skin: Skin is warm and dry.  Psychiatric: He has a normal mood and affect. His behavior is normal. Judgment and thought content normal    Assessment/Plan: 1. Functional deficits secondary to BPV vs brainstem infarct which require 3+ hours per day of interdisciplinary therapy in a comprehensive inpatient rehab setting. Physiatrist is providing close team supervision and 24 hour management of active medical problems listed below. Physiatrist and rehab team continue to assess barriers to discharge/monitor patient progress toward functional and medical goals.  Stable for D/C today F/u PCP in 1-2 weeks F/u PM&R 3 weeks See D/C summary See D/C instructionsFIM: FIM - Bathing Bathing Steps Patient Completed: Chest;Right Arm;Left Arm;Abdomen;Front perineal area;Buttocks;Right upper leg;Left upper leg;Right lower leg (including foot);Left lower leg (including foot) Bathing: 6: Assistive device (Comment) (TTB)  FIM - Upper Body Dressing/Undressing Upper body dressing/undressing steps patient completed: Thread/unthread right sleeve of pullover shirt/dresss;Thread/unthread left sleeve of pullover shirt/dress;Put head through opening of pull over shirt/dress;Pull shirt over trunk Upper body dressing/undressing: 6: Assistive device (Comment) (used RW to retrieve clothing) FIM - Lower Body Dressing/Undressing Lower body dressing/undressing steps patient completed: Thread/unthread right underwear leg;Thread/unthread left underwear leg;Pull underwear up/down;Thread/unthread right pants leg;Thread/unthread left pants leg;Pull pants up/down;Don/Doff right sock;Don/Doff left sock Lower body  dressing/undressing: 6: More than reasonable amount of time (used RW to retrieve clothing)  FIM - Toileting Toileting steps completed by patient: Adjust clothing after toileting;Performs perineal hygiene;Adjust clothing prior to toileting Toileting Assistive Devices: Grab bar or rail for support Toileting: 5: Supervision: Safety issues/verbal cues  FIM - Radio producer Devices: Grab bars;Walker Toilet Transfers: 6-More than reasonable amt of time;6-To toilet/ BSC;6-From toilet/BSC  FIM - Engineer, site: Copy: 6: Bed > Chair or W/C: No assist;6: Chair or W/C > Bed: No assist;7: Supine > Sit: No assist;7: Sit > Supine: No assist  FIM - Locomotion: Wheelchair Distance: 150 Locomotion: Wheelchair: 0: Activity did not occur FIM - Locomotion: Ambulation Locomotion: Ambulation Assistive Devices: Administrator Ambulation/Gait Assistance: 6: Modified independent (Device/Increase time) Locomotion: Ambulation: 6: Travels 150 ft or more with assistive device/no helper  Comprehension Comprehension Mode: Auditory Comprehension: 7-Follows complex conversation/direction: With  no assist  Expression Expression Mode: Verbal Expression: 6-Expresses complex ideas: With extra time/assistive device  Social Interaction Social Interaction: 7-Interacts appropriately with others - No medications needed.  Problem Solving Problem Solving: 7-Solves complex problems: Recognizes & self-corrects  Memory Memory: 7-Complete Independence: No helper  Medical Problem List and Plan:  1. 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 DVT Prophylaxis/Anticoagulation:D/C Subcutaneous heparin.Ambulates 150 feet   3. Pain Management: Tylenol as needed. Monitor with increased mobility  4. Neuropsych: This patient is capable of making decisions on his/her own behalf.  5. ANCA positive vasculitis  chronic kidney disease. Followup chemistries continue immunosuppressants. Baseline creatinine 1.70  6. Diabetes mellitus with peripheral neuropathy. Hemoglobin A1c 6.6. Continue sliding scale insulin. Check blood sugars a.c. and at bedtime. --controlled at present 7.Hypertension. Lasix 40 mg daily, hydralazine 50 mg twice a day, Coreg 12.5 mg twice a day. Improving control 8.BPH. Avodart /Flomax. No retention reported  LOS (Days) 7 A FACE TO FACE EVALUATION WAS PERFORMED  Ryan Jimenez 05/02/2013, 8:58 AM

## 2013-05-02 NOTE — Progress Notes (Signed)
Social Work Discharge Note Discharge Note  The overall goal for the admission was met for:   Discharge location: Yes-home with wife, daughter';s here to assist also  Length of Stay: Yes-7 days  Discharge activity level: Yes-mod/i level  Home/community participation: Yes  Services provided included: MD, RD, PT, OT, RN, TR, Pharmacy and SW  Financial Services: Medicare and Private Insurance: commerical generic  Follow-up services arranged: [facility]patient: [facility] oppt-6/25 2;15-3;30 pm  Comments (or additional information):pt did very well, pleased with progress  Patient/Family verbalized understanding of follow-up arrangements: Yes  Individual responsible for coordination of the follow-up plan: self &  Nowell Sites-wife  Confirmed correct DME delivered: [name] 05/02/2013    Ritvik Mczeal, Gardiner Rhyme

## 2013-05-02 NOTE — Discharge Summary (Signed)
  Discharge summary job [id number]

## 2013-05-08 ENCOUNTER — Ambulatory Visit: Payer: Medicare Other | Admitting: Rehabilitative and Restorative Service Providers"

## 2013-05-13 ENCOUNTER — Ambulatory Visit (HOSPITAL_BASED_OUTPATIENT_CLINIC_OR_DEPARTMENT_OTHER): Payer: Medicare Other | Admitting: Physical Medicine & Rehabilitation

## 2013-05-13 ENCOUNTER — Encounter: Payer: Medicare Other | Attending: Physical Medicine & Rehabilitation

## 2013-05-13 ENCOUNTER — Encounter: Payer: Self-pay | Admitting: Physical Medicine & Rehabilitation

## 2013-05-13 VITALS — BP 129/67 | HR 75 | Resp 14 | Ht 69.0 in | Wt 217.4 lb

## 2013-05-13 DIAGNOSIS — I10 Essential (primary) hypertension: Secondary | ICD-10-CM | POA: Insufficient documentation

## 2013-05-13 DIAGNOSIS — I6529 Occlusion and stenosis of unspecified carotid artery: Secondary | ICD-10-CM | POA: Insufficient documentation

## 2013-05-13 DIAGNOSIS — H8110 Benign paroxysmal vertigo, unspecified ear: Secondary | ICD-10-CM

## 2013-05-13 DIAGNOSIS — H832X9 Labyrinthine dysfunction, unspecified ear: Secondary | ICD-10-CM | POA: Insufficient documentation

## 2013-05-13 DIAGNOSIS — Z7902 Long term (current) use of antithrombotics/antiplatelets: Secondary | ICD-10-CM | POA: Insufficient documentation

## 2013-05-13 DIAGNOSIS — E119 Type 2 diabetes mellitus without complications: Secondary | ICD-10-CM | POA: Insufficient documentation

## 2013-05-13 NOTE — Patient Instructions (Addendum)
Neurologist is Dr Leonie Man  Graduated return to driving instructions were provided. It is recommended that the patient first drives with another licensed driver in an empty parking lot. If the patient does well with this, and they can drive on a quiet street with the licensed driver. If the patient does well with this they can drive on a busy street with a licensed driver. If the patient does well with this, the next time out they can go by himself. For the first month after resuming driving, I recommend no nighttime or Interstate driving.

## 2013-05-13 NOTE — Progress Notes (Signed)
Subjective:    Patient ID: Ryan Jimenez, male    DOB: [birth year], 79 y.o.   MRN: [id number]  HPI This is a 79 year old right-handed male  with history of positive ANCA vasculitis, diabetes mellitus, who was  admitted on April 23, 2013, with dizziness and slurred speech. MRI of  the brain negative for acute infarction. MRA of the head with 1.4-mm  aneurysm of the left internal carotid artery, cavernous segment versus  bulge as a result of atherosclerotic-type changes. MRA of the neck with  54% diameter stenosis, proximal right ICA. Echocardiogram with ejection  fraction of [id number], grade 1 diastolic dysfunction. The patient did not  receive tPA. Neurology Service was consulted, placed on Plavix therapy  for CVA prophylaxis as well as subcutaneous heparin for DVT prophylaxis.  Suspect small right brainstem infarction not seen on MRI felt to be  thrombotic secondary to small vessel disease. Physical and occupational  therapy ongoing. The patient was admitted for comprehensive rehab  program.  OT/PT twice a week  No falls  Saw PCP 3 days ago, no changes except schedule for novalog Pain Inventory Average Pain 2 Pain Right Now 2 My pain is aching  In the last 24 hours, has pain interfered with the following? General activity 0 Relation with others 0 Enjoyment of life 0 What TIME of day is your pain at its worst? daytime Sleep (in general) Good  Pain is worse with: walking Pain improves with: rest and pacing activities Relief from Meds: no pain med  Mobility use a cane do you drive?  yes  Function retired I need assistance with the following:  meal prep, household duties and shopping  Neuro/Psych trouble walking loss of taste or smell  Prior Studies Any changes since last visit?  no  Physicians involved in your care Any changes since last visit?  no   Family History  Problem Relation Age of Onset  . Hypertension Mother   . Heart disease Mother   . Diabetes  Sister    History   Social History  . Marital Status: Married    Spouse Name: N/A    Number of Children: N/A  . Years of Education: N/A   Social History Main Topics  . Smoking status: Former Smoker -- 1.00 packs/day for 40 years    Types: Cigarettes  . Smokeless tobacco: Never Used  . Alcohol Use: No     Comment: 11/23/11 "couple ounces q month maybe"  . Drug Use: No  . Sexually Active: Not Currently   Other Topics Concern  . None   Social History Narrative  . None   Past Surgical History  Procedure Laterality Date  . Hydrocele excision    . Cataract extraction w/ intraocular lens implant  ~ 2009    right   Past Medical History  Diagnosis Date  . Hypertension   . Diabetes mellitus   . Hypercholesterolemia   . Gout   . Shortness of breath on exertion   . Bronchitis   . Arthritis   . Near syncope 10/2011; 11/2011  . Renal disorder   . CKD (chronic kidney disease)   . Glomerulonephritis   . Pancytopenia   . Anemia    BP 129/67  Pulse 75  Resp 14  Ht 5\' 9"  (1.753 m)  Wt 217 lb 6.4 oz (98.612 kg)  BMI 32.09 kg/m2  SpO2 95%     Review of Systems  Constitutional: Positive for appetite change and unexpected weight change.  Respiratory: Positive  for cough.   Cardiovascular: Positive for leg swelling.  Musculoskeletal: Positive for gait problem.  All other systems reviewed and are negative.       Objective:   Physical Exam  Nursing note and vitals reviewed. Constitutional: He is oriented to person, place, and time. He appears well-developed and well-nourished.  HENT:  Head: Normocephalic and atraumatic.  Eyes: Conjunctivae and EOM are normal. Pupils are equal, round, and reactive to light.  Neck: Normal range of motion.  Neurological: He is alert and oriented to person, place, and time. He has normal reflexes. A sensory deficit is present.  Psychiatric: He has a normal mood and affect.          Assessment & Plan:  1. Acute onset vestibular  dysfunction which is now resolved. Likely benign positional vertigo although small brainstem infarct not visualized on MRI as possible as well. The patient will followup with neurology. He also had an incidental finding of a small aneurysm versus atherosclerotic changes in the left ICA  Discussed returned driving graduated . instructions below Graduated return to driving instructions were provided. It is recommended that the patient first drives with another licensed driver in an empty parking lot. If the patient does well with this, and they can drive on a quiet street with the licensed driver. If the patient does well with this they can drive on a busy street with a licensed driver. If the patient does well with this, the next time out they can go by himself. For the first month after resuming driving, I recommend no nighttime or Interstate driving.   RTC PRN

## 2013-08-22 ENCOUNTER — Other Ambulatory Visit: Payer: Self-pay | Admitting: Orthopaedic Surgery

## 2013-08-22 DIAGNOSIS — M4807 Spinal stenosis, lumbosacral region: Secondary | ICD-10-CM

## 2013-08-22 DIAGNOSIS — M479 Spondylosis, unspecified: Secondary | ICD-10-CM

## 2013-08-23 ENCOUNTER — Ambulatory Visit
Admission: RE | Admit: 2013-08-23 | Discharge: 2013-08-23 | Disposition: A | Discharge status: Home / Self Care | Payer: Medicare Other | Source: Ambulatory Visit | Attending: Orthopaedic Surgery | Admitting: Orthopaedic Surgery

## 2013-08-23 DIAGNOSIS — M479 Spondylosis, unspecified: Secondary | ICD-10-CM

## 2013-08-23 DIAGNOSIS — M4807 Spinal stenosis, lumbosacral region: Secondary | ICD-10-CM

## 2013-09-02 ENCOUNTER — Other Ambulatory Visit: Payer: Medicare Other

## 2014-04-03 ENCOUNTER — Emergency Department (HOSPITAL_COMMUNITY)
Admission: EM | Admit: 2014-04-03 | Discharge: 2014-04-04 | Disposition: A | Discharge status: Home / Self Care | Payer: Medicare Other | Attending: Emergency Medicine | Admitting: Emergency Medicine

## 2014-04-03 ENCOUNTER — Encounter (HOSPITAL_COMMUNITY): Payer: Self-pay | Admitting: Emergency Medicine

## 2014-04-03 DIAGNOSIS — I129 Hypertensive chronic kidney disease with stage 1 through stage 4 chronic kidney disease, or unspecified chronic kidney disease: Secondary | ICD-10-CM | POA: Insufficient documentation

## 2014-04-03 DIAGNOSIS — M109 Gout, unspecified: Secondary | ICD-10-CM | POA: Insufficient documentation

## 2014-04-03 DIAGNOSIS — Z8709 Personal history of other diseases of the respiratory system: Secondary | ICD-10-CM | POA: Insufficient documentation

## 2014-04-03 DIAGNOSIS — S80861A Insect bite (nonvenomous), right lower leg, initial encounter: Secondary | ICD-10-CM

## 2014-04-03 DIAGNOSIS — E7800 Pure hypercholesterolemia, unspecified: Secondary | ICD-10-CM | POA: Insufficient documentation

## 2014-04-03 DIAGNOSIS — Z87891 Personal history of nicotine dependence: Secondary | ICD-10-CM | POA: Insufficient documentation

## 2014-04-03 DIAGNOSIS — S90569A Insect bite (nonvenomous), unspecified ankle, initial encounter: Secondary | ICD-10-CM | POA: Insufficient documentation

## 2014-04-03 DIAGNOSIS — Z7902 Long term (current) use of antithrombotics/antiplatelets: Secondary | ICD-10-CM | POA: Insufficient documentation

## 2014-04-03 DIAGNOSIS — N189 Chronic kidney disease, unspecified: Secondary | ICD-10-CM | POA: Insufficient documentation

## 2014-04-03 DIAGNOSIS — E119 Type 2 diabetes mellitus without complications: Secondary | ICD-10-CM | POA: Insufficient documentation

## 2014-04-03 DIAGNOSIS — Z8739 Personal history of other diseases of the musculoskeletal system and connective tissue: Secondary | ICD-10-CM | POA: Insufficient documentation

## 2014-04-03 DIAGNOSIS — Y939 Activity, unspecified: Secondary | ICD-10-CM | POA: Insufficient documentation

## 2014-04-03 DIAGNOSIS — W57XXXA Bitten or stung by nonvenomous insect and other nonvenomous arthropods, initial encounter: Principal | ICD-10-CM

## 2014-04-03 DIAGNOSIS — Z862 Personal history of diseases of the blood and blood-forming organs and certain disorders involving the immune mechanism: Secondary | ICD-10-CM | POA: Insufficient documentation

## 2014-04-03 DIAGNOSIS — Z79899 Other long term (current) drug therapy: Secondary | ICD-10-CM | POA: Insufficient documentation

## 2014-04-03 DIAGNOSIS — Y929 Unspecified place or not applicable: Secondary | ICD-10-CM | POA: Insufficient documentation

## 2014-04-03 NOTE — ED Notes (Signed)
Pt states that his wife removed a tick from his bottom. Pt states that he believes that the tick was attached pretty good.

## 2014-04-04 MED ORDER — DOXYCYCLINE HYCLATE 100 MG PO CAPS: 100.0000 mg | ORAL_CAPSULE | Freq: Two times a day (BID) | ORAL | Status: DC

## 2014-04-04 NOTE — Discharge Instructions (Signed)
Take the prescribed medication as directed. Follow-up with your primary care physician as needed. Return to the ED for new or worsening symptoms-- rash, joint paints, high fever, chills, etc.

## 2014-04-04 NOTE — ED Notes (Signed)
Pt. Here for tick bite. Pt. States he has a "bump" where the tick was. States it had been itching for x3 days.

## 2014-04-04 NOTE — ED Provider Notes (Signed)
CSN: [id number]     Arrival date & time 04/03/14  2325 History   First MD Initiated Contact with Patient 04/04/14 0054     Chief Complaint  Patient presents with  . Insect Bite     (Consider location/radiation/quality/duration/timing/severity/associated sxs/prior Treatment) The history is provided by the patient and medical records.   This is a 80 year old male with past medical history significant for hypertension, diabetes, hyperlipidemia, gout, chronic kidney disease, presented to the ED for tick bite. Patient states he recently returned to [place] from [place] last Thursday.  States this morning his wife a tick off of his right thigh which he assumes has been there for 3-4 days based on the size of the tick.  He states he has localized itching to the area of bite.  No fevers, chills, joint pain/swelling, or rashes.  Wife states she cleaned the area thoroughly with soap and warm water after removal.  VS stable on arrival.  Past Medical History  Diagnosis Date  . Hypertension   . Diabetes mellitus   . Hypercholesterolemia   . Gout   . Shortness of breath on exertion   . Bronchitis   . Arthritis   . Near syncope 10/2011; 11/2011  . Renal disorder   . CKD (chronic kidney disease)   . Glomerulonephritis   . Pancytopenia   . Anemia    Past Surgical History  Procedure Laterality Date  . Hydrocele excision    . Cataract extraction w/ intraocular lens implant  ~ 2009    right   Family History  Problem Relation Age of Onset  . Hypertension Mother   . Heart disease Mother   . Diabetes Sister    History  Substance Use Topics  . Smoking status: Former Smoker -- 1.00 packs/day for 40 years    Types: Cigarettes  . Smokeless tobacco: Never Used  . Alcohol Use: No     Comment: 11/23/11 "couple ounces q month maybe"    Review of Systems  Skin:       Tick bite  All other systems reviewed and are negative.     Allergies  Review of patient's allergies indicates no  known allergies.  Home Medications   Prior to Admission medications   Medication Sig Start Date End Date Taking? Authorizing Provider  azaTHIOprine (IMURAN) 50 MG tablet Take 100 mg by mouth daily.    Historical Provider, MD  carvedilol (COREG) 12.5 MG tablet Take 1 tablet (12.5 mg total) by mouth 2 (two) times daily with a meal. 05/02/13   Lavon Paganini Angiulli, PA-C  clopidogrel (PLAVIX) 75 MG tablet Take 1 tablet (75 mg total) by mouth daily with breakfast. 05/02/13   Lavon Paganini Angiulli, PA-C  colchicine 0.6 MG tablet Take 1 tablet (0.6 mg total) by mouth 2 (two) times daily as needed (for gout flareups). 05/02/13   Lavon Paganini Angiulli, PA-C  cyclophosphamide (CYTOXAN) 50 MG tablet Take 50 mg by mouth daily. Give on an empty stomach 1 hour before or 2 hours after meals.    Historical Provider, MD  dutasteride (AVODART) 0.5 MG capsule Take 1 capsule (0.5 mg total) by mouth daily. 05/02/13   Lavon Paganini Angiulli, PA-C  furosemide (LASIX) 40 MG tablet Take 1 tablet (40 mg total) by mouth daily. 05/02/13   Lavon Paganini Angiulli, PA-C  hydrALAZINE (APRESOLINE) 100 MG tablet Take 0.5 tablets (50 mg total) by mouth 2 (two) times daily. 05/02/13   Lavon Paganini Angiulli, PA-C  potassium chloride SA (K-DUR,KLOR-CON)  20 MEQ tablet Take 2 tablets (40 mEq total) by mouth daily. 05/02/13   Lavon Paganini Angiulli, PA-C  rosuvastatin (CRESTOR) 10 MG tablet Take 1 tablet (10 mg total) by mouth at bedtime. 05/02/13   Lavon Paganini Angiulli, PA-C  tamsulosin (FLOMAX) 0.4 MG CAPS Take 1 capsule (0.4 mg total) by mouth at bedtime. 05/02/13   Daniel J Angiulli, PA-C  Tetrahydroz-Dextran-PEG-Povid (EYE DROPS ADVANCED RELIEF OP) Place 1 drop into both eyes daily.    Historical Provider, MD  Vitamin D, Ergocalciferol, (DRISDOL) 50000 UNITS CAPS Take 50,000 Units by mouth every 7 (seven) days. Take on sundays    Historical Provider, MD   BP 151/70  Pulse 67  Temp(Src) 98 F (36.7 C) (Oral)  Resp 16  Ht 5\' 9"  (1.753 m)  Wt 222 lb (100.699 kg)  BMI  32.77 kg/m2  SpO2 100%  Physical Exam  Nursing note and vitals reviewed. Constitutional: He is oriented to person, place, and time. He appears well-developed and well-nourished.  HENT:  Head: Normocephalic and atraumatic.  Mouth/Throat: Oropharynx is clear and moist.  Eyes: Conjunctivae and EOM are normal. Pupils are equal, round, and reactive to light.  Neck: Normal range of motion.  Cardiovascular: Normal rate, regular rhythm and normal heart sounds.   Pulmonary/Chest: Effort normal and breath sounds normal.  Abdominal: Bowel sounds are normal.  Musculoskeletal: Normal range of motion.  Neurological: He is alert and oriented to person, place, and time.  Skin: Skin is warm and dry. No petechiae and no rash noted.  Right thigh with 2 tick bites, one along the lateral aspect and one along the posterior aspect near buttock; no surrounding erythema, induration, or signs of cellulitis; no superimposed infection; no joint erythema or swelling; no bodily rashes  Psychiatric: He has a normal mood and affect.    ED Course  Procedures (including critical care time) Labs Review Labs Reviewed - No data to display  Imaging Review No results found.   EKG Interpretation None      MDM   Final diagnoses:  Tick bite of right lower leg   80 year old male with known tick bite of right lateral thigh. On exam he has no bodily rash, signs of cellulitis, or superimposed infection.  He will be started on doxycycline for coverage of rocky [place] spotted fever and Lyme disease. He will followup with his primary care physician.  Discussed plan with patient, he/she acknowledged understanding and agreed with plan of care.  Return precautions given for new or worsening symptoms.  Larene Pickett, PA-C 04/04/14 0110

## 2014-04-04 NOTE — ED Provider Notes (Signed)
Medical screening examination/treatment/procedure(s) were performed by non-physician practitioner and as supervising physician I was immediately available for consultation/collaboration.   EKG Interpretation None        Ezequiel Essex, MD 04/04/14 [id number]

## 2014-08-20 ENCOUNTER — Emergency Department (INDEPENDENT_AMBULATORY_CARE_PROVIDER_SITE_OTHER)
Admission: EM | Admit: 2014-08-20 | Discharge: 2014-08-20 | Disposition: A | Discharge status: Home / Self Care | Payer: Medicare Other | Source: Home / Self Care | Attending: Family Medicine | Admitting: Family Medicine

## 2014-08-20 ENCOUNTER — Encounter (HOSPITAL_COMMUNITY): Payer: Self-pay | Admitting: Emergency Medicine

## 2014-08-20 DIAGNOSIS — H109 Unspecified conjunctivitis: Secondary | ICD-10-CM

## 2014-08-20 MED ORDER — POLYMYXIN B-TRIMETHOPRIM 10000-0.1 UNIT/ML-% OP SOLN: 2.0000 [drp] | OPHTHALMIC | Status: DC

## 2014-08-20 NOTE — ED Provider Notes (Signed)
Ryan Jimenez is a 80 y.o. male who presents to Urgent Care today for left I. eructation. Symptoms present for 5 days. No injury. The eye is somewhat crusted. He has tried Systane artificial tears which helped a little. No blurry vision. No fevers or chills.   Past Medical History  Diagnosis Date  . Hypertension   . Diabetes mellitus   . Hypercholesterolemia   . Gout   . Shortness of breath on exertion   . Bronchitis   . Arthritis   . Near syncope 10/2011; 11/2011  . Renal disorder   . CKD (chronic kidney disease)   . Glomerulonephritis   . Pancytopenia   . Anemia    History  Substance Use Topics  . Smoking status: Former Smoker -- 1.00 packs/day for 40 years    Types: Cigarettes  . Smokeless tobacco: Never Used  . Alcohol Use: No     Comment: 11/23/11 "couple ounces q month maybe"   ROS as above Medications: No current facility-administered medications for this encounter.   Current Outpatient Prescriptions  Medication Sig Dispense Refill  . azaTHIOprine (IMURAN) 50 MG tablet Take 100 mg by mouth daily.      . carvedilol (COREG) 12.5 MG tablet Take 1 tablet (12.5 mg total) by mouth 2 (two) times daily with a meal.  60 tablet  1  . clopidogrel (PLAVIX) 75 MG tablet Take 1 tablet (75 mg total) by mouth daily with breakfast.  30 tablet  2  . dutasteride (AVODART) 0.5 MG capsule Take 1 capsule (0.5 mg total) by mouth daily.  30 capsule  1  . hydrALAZINE (APRESOLINE) 100 MG tablet Take 0.5 tablets (50 mg total) by mouth 2 (two) times daily.  60 tablet  1  . potassium chloride SA (K-DUR,KLOR-CON) 20 MEQ tablet Take 2 tablets (40 mEq total) by mouth daily.  30 tablet  1  . rosuvastatin (CRESTOR) 10 MG tablet Take 1 tablet (10 mg total) by mouth at bedtime.  30 tablet  1  . tamsulosin (FLOMAX) 0.4 MG CAPS Take 1 capsule (0.4 mg total) by mouth at bedtime.  30 capsule  1  . colchicine 0.6 MG tablet Take 1 tablet (0.6 mg total) by mouth 2 (two) times daily as needed (for gout  flareups).  30 tablet  0  . cyclophosphamide (CYTOXAN) 50 MG tablet Take 50 mg by mouth daily. Give on an empty stomach 1 hour before or 2 hours after meals.      [name] doxycycline (VIBRAMYCIN) 100 MG capsule Take 1 capsule (100 mg total) by mouth 2 (two) times daily.  28 capsule  0  . furosemide (LASIX) 40 MG tablet Take 1 tablet (40 mg total) by mouth daily.  30 tablet  2  . Tetrahydroz-Dextran-PEG-Povid (EYE DROPS ADVANCED RELIEF OP) Place 1 drop into both eyes daily.      . Vitamin D, Ergocalciferol, (DRISDOL) 50000 UNITS CAPS Take 50,000 Units by mouth every 7 (seven) days. Take on sundays        Exam:  BP 143/77  Pulse 63  Temp(Src) 98.1 F (36.7 C) (Oral)  Resp 18  SpO2 95% Gen: Well NAD HEENT: EOMI,  MMM mild left conjunctiva injection. PERRLA bilaterally Lungs: Normal work of breathing. CTABL Heart: RRR no MRG Abd: NABS, Soft. Nondistended, Nontender Exts: Brisk capillary refill, warm and well perfused.   Visual Acuity:  Right Eye Distance: 20/20 w/glasses Left Eye Distance: 20/20 w/glasses Bilateral Distance: 20/20 w/glasses   No results found for this or  any previous visit (from the past 24 hour(s)). No results found.  Assessment and Plan: 80 y.o. male with left eye conjunctivitis. Treatment with warm compress and Polytrim antibiotic drops.  Discussed warning signs or symptoms. Please see discharge instructions. Patient expresses understanding.     Gregor Hams, MD 08/20/14 2100

## 2014-08-20 NOTE — Discharge Instructions (Signed)
Thank you for coming in today. Use warm compress.  Use over-the-counter Zaditor eyedrops (Ketotifen) twice daily as needed for itching.    Bacterial Conjunctivitis Bacterial conjunctivitis, commonly called pink eye, is an inflammation of the clear membrane that covers the white part of the eye (conjunctiva). The inflammation can also happen on the underside of the eyelids. The blood vessels in the conjunctiva become inflamed, causing the eye to become red or pink. Bacterial conjunctivitis may spread easily from one eye to another and from person to person (contagious).  CAUSES  Bacterial conjunctivitis is caused by bacteria. The bacteria may come from your own skin, your upper respiratory tract, or from someone else with bacterial conjunctivitis. SYMPTOMS  The normally white color of the eye or the underside of the eyelid is usually pink or red. The pink eye is usually associated with irritation, tearing, and some sensitivity to light. Bacterial conjunctivitis is often associated with a thick, yellowish discharge from the eye. The discharge may turn into a crust on the eyelids overnight, which causes your eyelids to stick together. If a discharge is present, there may also be some blurred vision in the affected eye. DIAGNOSIS  Bacterial conjunctivitis is diagnosed by your caregiver through an eye exam and the symptoms that you report. Your caregiver looks for changes in the surface tissues of your eyes, which may point to the specific type of conjunctivitis. A sample of any discharge may be collected on a cotton-tip swab if you have a severe case of conjunctivitis, if your cornea is affected, or if you keep getting repeat infections that do not respond to treatment. The sample will be sent to a lab to see if the inflammation is caused by a bacterial infection and to see if the infection will respond to antibiotic medicines. TREATMENT   Bacterial conjunctivitis is treated with antibiotics. Antibiotic  eyedrops are most often used. However, antibiotic ointments are also available. Antibiotics pills are sometimes used. Artificial tears or eye washes may ease discomfort. HOME CARE INSTRUCTIONS   To ease discomfort, apply a cool, clean washcloth to your eye for 10-20 minutes, 3-4 times a day.  Gently wipe away any drainage from your eye with a warm, wet washcloth or a cotton ball.  Wash your hands often with soap and water. Use paper towels to dry your hands.  Do not share towels or washcloths. This may spread the infection.  Change or wash your pillowcase every day.  You should not use eye makeup until the infection is gone.  Do not operate machinery or drive if your vision is blurred.  Stop using contact lenses. Ask your caregiver how to sterilize or replace your contacts before using them again. This depends on the type of contact lenses that you use.  When applying medicine to the infected eye, do not touch the edge of your eyelid with the eyedrop bottle or ointment tube. SEEK IMMEDIATE MEDICAL CARE IF:   Your infection has not improved within 3 days after beginning treatment.  You had yellow discharge from your eye and it returns.  You have increased eye pain.  Your eye redness is spreading.  Your vision becomes blurred.  You have a fever or persistent symptoms for more than 2-3 days.  You have a fever and your symptoms suddenly get worse.  You have facial pain, redness, or swelling. MAKE SURE YOU:   Understand these instructions.  Will watch your condition.  Will get help right away if you are not doing well  or get worse. Document Released: 10/31/2005 Document Revised: 03/17/2014 Document Reviewed: 04/02/2012 [facility] Patient Information 2015 [place], [place]. This information is not intended to replace advice given to you by your health care provider. Make sure you discuss any questions you have with your health care provider.

## 2014-08-20 NOTE — ED Notes (Signed)
C/o left eye pain onset 5-6 days; getting worse Sx include: blurry vision, redness, crusty eyes in the am Denies inj/trauma Alert, no signs of acute distress.

## 2014-09-04 ENCOUNTER — Other Ambulatory Visit: Payer: Self-pay | Admitting: Nephrology

## 2014-09-04 DIAGNOSIS — R319 Hematuria, unspecified: Secondary | ICD-10-CM

## 2014-09-04 DIAGNOSIS — N183 Chronic kidney disease, stage 3 unspecified: Secondary | ICD-10-CM

## 2014-09-09 ENCOUNTER — Ambulatory Visit
Admission: RE | Admit: 2014-09-09 | Discharge: 2014-09-09 | Disposition: A | Discharge status: Home / Self Care | Payer: Medicare Other | Source: Ambulatory Visit | Attending: Nephrology | Admitting: Nephrology

## 2014-09-09 DIAGNOSIS — N183 Chronic kidney disease, stage 3 unspecified: Secondary | ICD-10-CM

## 2014-09-09 DIAGNOSIS — R319 Hematuria, unspecified: Secondary | ICD-10-CM

## 2015-04-03 ENCOUNTER — Encounter (HOSPITAL_COMMUNITY): Payer: Self-pay | Admitting: *Deleted

## 2015-04-03 ENCOUNTER — Observation Stay (HOSPITAL_COMMUNITY)
Admission: EM | Admit: 2015-04-03 | Discharge: 2015-04-04 | Disposition: A | Discharge status: Home / Self Care | Payer: Medicare Other | Attending: Internal Medicine | Admitting: Internal Medicine

## 2015-04-03 ENCOUNTER — Emergency Department (HOSPITAL_COMMUNITY): Payer: Medicare Other

## 2015-04-03 DIAGNOSIS — Z862 Personal history of diseases of the blood and blood-forming organs and certain disorders involving the immune mechanism: Secondary | ICD-10-CM | POA: Diagnosis not present

## 2015-04-03 DIAGNOSIS — M109 Gout, unspecified: Secondary | ICD-10-CM | POA: Diagnosis not present

## 2015-04-03 DIAGNOSIS — M1990 Unspecified osteoarthritis, unspecified site: Secondary | ICD-10-CM | POA: Diagnosis not present

## 2015-04-03 DIAGNOSIS — Z79899 Other long term (current) drug therapy: Secondary | ICD-10-CM | POA: Insufficient documentation

## 2015-04-03 DIAGNOSIS — E7800 Pure hypercholesterolemia, unspecified: Secondary | ICD-10-CM | POA: Diagnosis present

## 2015-04-03 DIAGNOSIS — I10 Essential (primary) hypertension: Secondary | ICD-10-CM | POA: Diagnosis present

## 2015-04-03 DIAGNOSIS — E785 Hyperlipidemia, unspecified: Secondary | ICD-10-CM | POA: Insufficient documentation

## 2015-04-03 DIAGNOSIS — Z87891 Personal history of nicotine dependence: Secondary | ICD-10-CM | POA: Insufficient documentation

## 2015-04-03 DIAGNOSIS — Z7902 Long term (current) use of antithrombotics/antiplatelets: Secondary | ICD-10-CM | POA: Diagnosis not present

## 2015-04-03 DIAGNOSIS — R079 Chest pain, unspecified: Secondary | ICD-10-CM | POA: Diagnosis present

## 2015-04-03 DIAGNOSIS — N189 Chronic kidney disease, unspecified: Secondary | ICD-10-CM | POA: Insufficient documentation

## 2015-04-03 DIAGNOSIS — E119 Type 2 diabetes mellitus without complications: Secondary | ICD-10-CM

## 2015-04-03 DIAGNOSIS — I639 Cerebral infarction, unspecified: Secondary | ICD-10-CM | POA: Diagnosis present

## 2015-04-03 DIAGNOSIS — N183 Chronic kidney disease, stage 3 unspecified: Secondary | ICD-10-CM

## 2015-04-03 DIAGNOSIS — R609 Edema, unspecified: Secondary | ICD-10-CM | POA: Insufficient documentation

## 2015-04-03 DIAGNOSIS — I129 Hypertensive chronic kidney disease with stage 1 through stage 4 chronic kidney disease, or unspecified chronic kidney disease: Secondary | ICD-10-CM | POA: Diagnosis not present

## 2015-04-03 DIAGNOSIS — R05 Cough: Secondary | ICD-10-CM | POA: Insufficient documentation

## 2015-04-03 DIAGNOSIS — Z8701 Personal history of pneumonia (recurrent): Secondary | ICD-10-CM | POA: Diagnosis not present

## 2015-04-03 DIAGNOSIS — Z8719 Personal history of other diseases of the digestive system: Secondary | ICD-10-CM | POA: Diagnosis not present

## 2015-04-03 DIAGNOSIS — R059 Cough, unspecified: Secondary | ICD-10-CM

## 2015-04-03 LAB — CBC
HCT: 41 % (ref 39.0–52.0)
HEMATOCRIT: 40.4 % (ref 39.0–52.0)
HEMOGLOBIN: 13.6 g/dL (ref 13.0–17.0)
Hemoglobin: 13.4 g/dL (ref 13.0–17.0)
MCH: 26.3 pg (ref 26.0–34.0)
MCH: 27.1 pg (ref 26.0–34.0)
MCHC: 32.7 g/dL (ref 30.0–36.0)
MCHC: 33.7 g/dL (ref 30.0–36.0)
MCV: 80.6 fL (ref 78.0–100.0)
MCV: 80.5 fL (ref 78.0–100.0)
PLATELETS: 136 10*3/uL — AB (ref 150–400)
Platelets: 132 10*3/uL — ABNORMAL LOW (ref 150–400)
RBC: 5.09 MIL/uL (ref 4.22–5.81)
RBC: 5.02 MIL/uL (ref 4.22–5.81)
RDW: 15.1 % (ref 11.5–15.5)
RDW: 15.2 % (ref 11.5–15.5)
WBC: 4.3 10*3/uL (ref 4.0–10.5)
WBC: 4.5 10*3/uL (ref 4.0–10.5)

## 2015-04-03 LAB — BASIC METABOLIC PANEL
ANION GAP: 9 (ref 5–15)
BUN: 18 mg/dL (ref 6–20)
CALCIUM: 9.3 mg/dL (ref 8.9–10.3)
CO2: 28 mmol/L (ref 22–32)
CREATININE: 1.38 mg/dL — AB (ref 0.61–1.24)
Chloride: 101 mmol/L (ref 101–111)
GFR calc Af Amer: 54 mL/min — ABNORMAL LOW (ref >60)
GFR calc non Af Amer: 47 mL/min — ABNORMAL LOW (ref >60)
GLUCOSE: 229 mg/dL — AB (ref 65–99)
POTASSIUM: 4 mmol/L (ref 3.5–5.1)
Sodium: 138 mmol/L (ref 135–145)

## 2015-04-03 LAB — BRAIN NATRIURETIC PEPTIDE: B Natriuretic Peptide: 30.8 pg/mL (ref 0.0–100.0)

## 2015-04-03 LAB — I-STAT TROPONIN, ED: Troponin i, poc: 0 ng/mL (ref 0.00–0.08)

## 2015-04-03 LAB — CREATININE, SERUM
Creatinine, Ser: 1.38 mg/dL — ABNORMAL HIGH (ref 0.61–1.24)
GFR calc Af Amer: 54 mL/min — ABNORMAL LOW (ref >60)
GFR calc non Af Amer: 47 mL/min — ABNORMAL LOW (ref >60)

## 2015-04-03 LAB — LIPID PANEL
Cholesterol: 165 mg/dL (ref 0–200)
HDL: 47 mg/dL (ref >40)
LDL CALC: 82 mg/dL (ref 0–99)
TRIGLYCERIDES: 178 mg/dL — AB (ref <150)
Total CHOL/HDL Ratio: 3.5 RATIO
VLDL: 36 mg/dL (ref 0–40)

## 2015-04-03 LAB — TSH: TSH: 1.935 u[IU]/mL (ref 0.350–4.500)

## 2015-04-03 LAB — GLUCOSE, CAPILLARY
Glucose-Capillary: 260 mg/dL — ABNORMAL HIGH (ref 65–99)
Glucose-Capillary: 284 mg/dL — ABNORMAL HIGH (ref 65–99)

## 2015-04-03 LAB — TROPONIN I: Troponin I: <0.03 ng/mL (ref <0.031)

## 2015-04-03 MED ORDER — AMLODIPINE BESYLATE 10 MG PO TABS
10.0000 mg | ORAL_TABLET | Freq: Every day | ORAL | Status: DC
  Administered 2015-04-04: 10 mg via ORAL
  Filled 2015-04-03: qty 1

## 2015-04-03 MED ORDER — VITAMIN D (ERGOCALCIFEROL) 1.25 MG (50000 UNIT) PO CAPS
50000.0000 [IU] | ORAL_CAPSULE | ORAL | Status: DC
  Filled 2015-04-03: qty 1

## 2015-04-03 MED ORDER — ASPIRIN 81 MG PO CHEW
243.0000 mg | CHEWABLE_TABLET | Freq: Once | ORAL | Status: AC
  Administered 2015-04-03: 243 mg via ORAL
  Filled 2015-04-03: qty 3

## 2015-04-03 MED ORDER — COLCHICINE 0.6 MG PO TABS: 0.6000 mg | ORAL_TABLET | Freq: Two times a day (BID) | ORAL | Status: DC | PRN

## 2015-04-03 MED ORDER — TAMSULOSIN HCL 0.4 MG PO CAPS
0.4000 mg | ORAL_CAPSULE | Freq: Every day | ORAL | Status: DC
  Administered 2015-04-03: 0.4 mg via ORAL
  Filled 2015-04-03: qty 1

## 2015-04-03 MED ORDER — HEPARIN SODIUM (PORCINE) 5000 UNIT/ML IJ SOLN
5000.0000 [IU] | Freq: Three times a day (TID) | INTRAMUSCULAR | Status: DC
  Administered 2015-04-03 – 2015-04-04 (×2): 5000 [IU] via SUBCUTANEOUS
  Filled 2015-04-03 (×2): qty 1

## 2015-04-03 MED ORDER — CARVEDILOL 12.5 MG PO TABS
12.5000 mg | ORAL_TABLET | Freq: Two times a day (BID) | ORAL | Status: DC
  Administered 2015-04-04: 12.5 mg via ORAL
  Filled 2015-04-03 (×2): qty 1

## 2015-04-03 MED ORDER — NITROGLYCERIN 0.4 MG SL SUBL: 0.4000 mg | SUBLINGUAL_TABLET | SUBLINGUAL | Status: DC | PRN

## 2015-04-03 MED ORDER — INSULIN ASPART 100 UNIT/ML ~~LOC~~ SOLN
0.0000 [IU] | SUBCUTANEOUS | Status: DC
  Administered 2015-04-03 – 2015-04-04 (×2): 5 [IU] via SUBCUTANEOUS
  Administered 2015-04-04: 1 [IU] via SUBCUTANEOUS

## 2015-04-03 MED ORDER — SODIUM CHLORIDE 0.9 % IV SOLN
INTRAVENOUS | Status: DC
  Administered 2015-04-03: 21:00:00 via INTRAVENOUS

## 2015-04-03 MED ORDER — ASPIRIN EC 325 MG PO TBEC
325.0000 mg | DELAYED_RELEASE_TABLET | Freq: Every day | ORAL | Status: DC
  Administered 2015-04-04: 325 mg via ORAL
  Filled 2015-04-03: qty 1

## 2015-04-03 MED ORDER — ROSUVASTATIN CALCIUM 10 MG PO TABS
10.0000 mg | ORAL_TABLET | Freq: Every day | ORAL | Status: DC
  Administered 2015-04-03: 10 mg via ORAL
  Filled 2015-04-03: qty 1

## 2015-04-03 MED ORDER — INSULIN DETEMIR 100 UNIT/ML FLEXPEN: 13.0000 [IU] | PEN_INJECTOR | Freq: Every evening | SUBCUTANEOUS | Status: DC | PRN

## 2015-04-03 MED ORDER — DUTASTERIDE 0.5 MG PO CAPS
0.5000 mg | ORAL_CAPSULE | Freq: Every day | ORAL | Status: DC
Start: 2015-04-03 — End: 2015-04-04
  Administered 2015-04-04: 0.5 mg via ORAL
  Filled 2015-04-03 (×2): qty 1

## 2015-04-03 NOTE — ED Notes (Signed)
Pt reports recent productive cough, has been diagnosed with pneumonia but still having mid chest discomfort and sob. Reports recent bloating and swelling. Airway intact, ekg done at triage.

## 2015-04-03 NOTE — ED Provider Notes (Signed)
CSN: [id number]     Arrival date & time 04/03/15  1336 History   First MD Initiated Contact with Patient 04/03/15 1653     Chief Complaint  Patient presents with  . Chest Pain  . Shortness of Breath   Ryan Jimenez is a 81 y.o. male with a history of diabetes, hypertension, hyperlipidemia, bronchitis, and chronic kidney disease who presents to the emergency department complaining of substernal chest tightness ongoing for the past 1-1/2 weeks. He reports his pain/tighntess can last 30 seconds and is intermittent. The patient also reports a cough ongoing for the past several months that is worse in the past week and a half. He reports coughing up white phlegm for the past several weeks and is concerned he might have pneumonia. The patient reports he traveled to [place] about 2 weeks ago and returned a week and a half ago by car. He reports the symptoms were ongoing during his trip to [place] and was evaluated at the hospital there. He reports he was diagnosed with pneumonia and was seen by his PCP 3 days ago and given a cough medication, but nothing for pneumonia. The patient denies having any chest pain currently or chest tightness currently. The patient reports a history of dyspnea on exertion that is unchanged in the past year. He denies any shortness of breath at rest. The patient is a former smoker. Patient denies history of MI or cardiovascular disease. He does report a history of bronchitis but denies a history of asthma. The patient denies personal history of DVTs or PEs. Patient denies recent trauma surgery. The patient was recently in a car for a long period of time traveling back from [place] about a week and a half ago. Patient denies fevers, chills, vomiting, abdominal pain, leg pain, rashes, palpitations, shortness of breath at rest. Patient takes a daily 81 mg ASA which he took today.   (Consider location/radiation/quality/duration/timing/severity/associated  sxs/prior Treatment) HPI  Past Medical History  Diagnosis Date  . Hypertension   . Diabetes mellitus   . Hypercholesterolemia   . Gout   . Shortness of breath on exertion   . Bronchitis   . Arthritis   . Near syncope 10/2011; 11/2011  . Renal disorder   . CKD (chronic kidney disease)   . Glomerulonephritis   . Pancytopenia   . Anemia    Past Surgical History  Procedure Laterality Date  . Hydrocele excision    . Cataract extraction w/ intraocular lens implant  ~ 2009    right   Family History  Problem Relation Age of Onset  . Hypertension Mother   . Heart disease Mother   . Diabetes Sister    History  Substance Use Topics  . Smoking status: Former Smoker -- 1.00 packs/day for 40 years    Types: Cigarettes  . Smokeless tobacco: Never Used  . Alcohol Use: No     Comment: 11/23/11 "couple ounces q month maybe"    Review of Systems  Constitutional: Negative for fever and chills.  HENT: Negative for congestion, ear pain and sore throat.   Eyes: Negative for visual disturbance.  Respiratory: Positive for cough, chest tightness and shortness of breath. Negative for wheezing.   Cardiovascular: Negative for palpitations.  Gastrointestinal: Negative for nausea, vomiting, abdominal pain and diarrhea.  Genitourinary: Negative for dysuria and hematuria.  Musculoskeletal: Negative for back pain and neck pain.  Skin: Negative for rash.  Neurological: Negative for light-headedness and headaches.  Allergies  Review of patient's allergies indicates no known allergies.  Home Medications   Prior to Admission medications   Medication Sig Start Date End Date Taking? Authorizing Provider  azaTHIOprine (IMURAN) 50 MG tablet Take 100 mg by mouth daily.    Historical Provider, MD  carvedilol (COREG) 12.5 MG tablet Take 1 tablet (12.5 mg total) by mouth 2 (two) times daily with a meal. 05/02/13   Lavon Paganini Angiulli, PA-C  clopidogrel (PLAVIX) 75 MG tablet Take 1 tablet (75 mg  total) by mouth daily with breakfast. 05/02/13   Lavon Paganini Angiulli, PA-C  colchicine 0.6 MG tablet Take 1 tablet (0.6 mg total) by mouth 2 (two) times daily as needed (for gout flareups). 05/02/13   Lavon Paganini Angiulli, PA-C  cyclophosphamide (CYTOXAN) 50 MG tablet Take 50 mg by mouth daily. Give on an empty stomach 1 hour before or 2 hours after meals.    Historical Provider, MD  doxycycline (VIBRAMYCIN) 100 MG capsule Take 1 capsule (100 mg total) by mouth 2 (two) times daily. 04/04/14   Larene Pickett, PA-C  dutasteride (AVODART) 0.5 MG capsule Take 1 capsule (0.5 mg total) by mouth daily. 05/02/13   Lavon Paganini Angiulli, PA-C  furosemide (LASIX) 40 MG tablet Take 1 tablet (40 mg total) by mouth daily. 05/02/13   Lavon Paganini Angiulli, PA-C  hydrALAZINE (APRESOLINE) 100 MG tablet Take 0.5 tablets (50 mg total) by mouth 2 (two) times daily. 05/02/13   Lavon Paganini Angiulli, PA-C  potassium chloride SA (K-DUR,KLOR-CON) 20 MEQ tablet Take 2 tablets (40 mEq total) by mouth daily. 05/02/13   Lavon Paganini Angiulli, PA-C  rosuvastatin (CRESTOR) 10 MG tablet Take 1 tablet (10 mg total) by mouth at bedtime. 05/02/13   Lavon Paganini Angiulli, PA-C  tamsulosin (FLOMAX) 0.4 MG CAPS Take 1 capsule (0.4 mg total) by mouth at bedtime. 05/02/13   Daniel J Angiulli, PA-C  Tetrahydroz-Dextran-PEG-Povid (EYE DROPS ADVANCED RELIEF OP) Place 1 drop into both eyes daily.    Historical Provider, MD  trimethoprim-polymyxin b (POLYTRIM) ophthalmic solution Place 2 drops into the left eye every 4 (four) hours. 08/20/14   Gregor Hams, MD  Vitamin D, Ergocalciferol, (DRISDOL) 50000 UNITS CAPS Take 50,000 Units by mouth every 7 (seven) days. Take on sundays    Historical Provider, MD   BP 152/71 mmHg  Pulse 52  Temp(Src) 97.8 F (36.6 C) (Oral)  Resp 21  Ht 5' 8.5" (1.74 m)  Wt 236 lb 12.8 oz (107.412 kg)  BMI 35.48 kg/m2  SpO2 99% Physical Exam  Constitutional: He appears well-developed and well-nourished. No distress.  Nontoxic appearing.   HENT:  Head: Normocephalic and atraumatic.  Mouth/Throat: Oropharynx is clear and moist. No oropharyngeal exudate.  Eyes: Conjunctivae are normal. Pupils are equal, round, and reactive to light. Right eye exhibits no discharge. Left eye exhibits no discharge.  Neck: Neck supple. No JVD present. No tracheal deviation present.  Cardiovascular: Normal rate, regular rhythm, normal heart sounds and intact distal pulses.  Exam reveals no gallop and no friction rub.   No murmur heard. Bilateral radial, posterior tibialis and dorsalis pedis pulses are intact.    Pulmonary/Chest: Effort normal and breath sounds normal. No respiratory distress. He has no wheezes. He has no rales. He exhibits no tenderness.  Lung sounds are slightly diminished but clear bilaterally. No chest tenderness to palpation.  Abdominal: Soft. He exhibits no distension. There is no tenderness. There is no rebound.  Musculoskeletal: He exhibits edema. He exhibits no tenderness.  Very mild bilateral ankle edema. No calf tenderness or calf edema.  Lymphadenopathy:    He has no cervical adenopathy.  Neurological: He is alert. Coordination normal.  Skin: Skin is warm and dry. No rash noted. He is not diaphoretic. No erythema. No pallor.  Psychiatric: He has a normal mood and affect. His behavior is normal.  Nursing note and vitals reviewed.   ED Course  Procedures (including critical care time) Labs Review Labs Reviewed  CBC - Abnormal; Notable for the following:    Platelets 136 (*)    All other components within normal limits  BASIC METABOLIC PANEL - Abnormal; Notable for the following:    Glucose, Bld 229 (*)    Creatinine, Ser 1.38 (*)    GFR calc non Af Amer 47 (*)    GFR calc Af Amer 54 (*)    All other components within normal limits  BRAIN NATRIURETIC PEPTIDE  I-STAT TROPOININ, ED    Imaging Review Dg Chest 2 View  04/03/2015   CLINICAL DATA:  Productive cough for 2 weeks  EXAM: CHEST  2 VIEW  COMPARISON:   04/23/2013  FINDINGS: The heart size and mediastinal contours are within normal limits. Both lungs are clear. The visualized skeletal structures are unremarkable.  IMPRESSION: No active cardiopulmonary disease.   Electronically Signed   By: Inez Catalina M.D.   On: 04/03/2015 14:20     EKG Interpretation   Date/Time:  Friday Apr 03 2015 13:45:08 EDT Ventricular Rate:  59 PR Interval:  170 QRS Duration: 140 QT Interval:  450 QTC Calculation: 445 R Axis:   79 Text Interpretation:  Sinus bradycardia Right bundle branch block Abnormal  ECG No significant change since last tracing Confirmed by JACUBOWITZ  MD,  SAM [phone]) on 04/03/2015 5:55:06 PM      Filed Vitals:   04/03/15 1532 04/03/15 1534 04/03/15 1536 04/03/15 1741  BP: 147/71 147/71  152/71  Pulse:  53 54 52  Temp:  97.8 F (36.6 C)    TempSrc:  Oral    Resp:  22 20 21   Height:      Weight:      SpO2:  97% 99% 99%     MDM   Meds given in ED:  Medications  aspirin chewable tablet 243 mg (243 mg Oral Given 04/03/15 1748)    New Prescriptions   No medications on file    Final diagnoses:  Chest pain, unspecified chest pain type  Cough   Patient with intermittent chest tightness/pain for the past week and half. HEART score is 5. EKG shows no significant change from his last tracing. Chest x-ray is negative for pneumonia. His initial troponin is negative. Patient given ASA in ED. Patient is asymptomatic during interview. Will consult [facility] for admission for ACS rule out with HEART score of 5. Patient accepted for admission by Dr. Ernestina Patches. The patient is in agreement with admission.   This patient was discussed with and evaluated by Dr. Winfred Leeds who agrees with assessment and plan.     Waynetta Pean, PA-C 04/03/15 [place], MD 04/04/15 [phone]

## 2015-04-03 NOTE — ED Notes (Signed)
Appointment time @ 19:05

## 2015-04-03 NOTE — H&P (Addendum)
Hospitalist Admission History and Physical  Patient name: Ryan Jimenez Medical record number: [id number] Date of birth: September 21, 1932 Age: 81 y.o. Gender: male  Primary Care Provider: Philis Fendt, MD  Chief Complaint: Chest Pain  History of Present Illness:This is a 81 y.o. year old male with significant past medical history of HTN, DM, stage 3-4 CKD, CVA  presenting with chest pain. Pt reports 1-2 days of intermittent CP. Central in nature without radiation. No alleviating/aggravating factors. Last approx 30 secs and self resolves. Denies any recent strenuous activity. Denies any prior hx/o heart disease. Reports that he had a ? Stress test done within the last 2 years locally that was negative. Pt states that he gets his care both in [place]. States that he was seen in [place] approx 2 months ago for PNA. Was seen in ER and not admitted per pt. States that he has seen doctors for similar sxs recently. Was told that pain was from PNA. CP mild in nature. Mild cough.  Presented to ER afebrile, hemodynamically stable. CBC and BMET WNL. Trop neg x1. EKG sinus bradycardia, RBBB ? TWI in anterior leads. Relatively unchanged from previous. S/p full dose ASA in ER. CXR WNL   HEART Score: 5-6  Assessment and Plan: Ryan Jimenez is a 81 y.o. year old male presenting with chest pain   Active Problems:   Chest pain   1- Chest Pain  -atypical in nature -no active CP currently  -HEART Score 5-6  -trop and EKG stable -CXR WNL- no infiltrate concerning of PNA recurrence (afebrile, no hypoxia, leukocytosis) -cycle CEs -full dose ASA -prn NTG -risk stratification labs -self reports cards eval w/in last 2 years (? [facility]-reports was done at [facility]) -cards c/s as clinically indicated   2-hx/o CVA -at baseline -cont plavix   3-IDDM -SSI -cont levemir  -A1C  4-HTN -BP stable  -cont home regimen  5-CKD -stage 3-4 chronically  -followed by renal outpt per pt  -at  baseline  -follow   FEN/GI: heart healthy-carb modified diet  Prophylaxis: sub aq heparin  Disposition: pending further evaluation Code Status:Full Code    Patient Active Problem List   Diagnosis Date Noted  . Chest pain 04/03/2015  . Thrombotic cerebral infarction 04/26/2013  . CVA (cerebral infarction) 04/23/2013  . Hidradenitis suppurativa of right axilla 04/05/2012  . Abscess of axillary region 04/04/2012  . Neutropenia 04/04/2012  . Pancytopenia due to chemotherapy 04/04/2012  . Anemia 04/04/2012  . Generalized weakness 04/04/2012  . Physical deconditioning 04/04/2012  . Benign positional vertigo 11/28/2011  . Hypertension 11/23/2011  . Renal failure 11/23/2011  . Type 2 diabetes mellitus 11/23/2011  . Hypercholesterolemia    Past Medical History: Past Medical History  Diagnosis Date  . Hypertension   . Diabetes mellitus   . Hypercholesterolemia   . Gout   . Shortness of breath on exertion   . Bronchitis   . Arthritis   . Near syncope 10/2011; 11/2011  . Renal disorder   . CKD (chronic kidney disease)   . Glomerulonephritis   . Pancytopenia   . Anemia     Past Surgical History: Past Surgical History  Procedure Laterality Date  . Hydrocele excision    . Cataract extraction w/ intraocular lens implant  ~ 2009    right    Social History: History   Social History  . Marital Status: Married    Spouse Name: N/A  . Number of Children: N/A  . Years of Education:  N/A   Social History Main Topics  . Smoking status: Former Smoker -- 1.00 packs/day for 40 years    Types: Cigarettes  . Smokeless tobacco: Never Used  . Alcohol Use: No     Comment: 11/23/11 "couple ounces q month maybe"  . Drug Use: No  . Sexual Activity: Not Currently   Other Topics Concern  . None   Social History Narrative    Family History: Family History  Problem Relation Age of Onset  . Hypertension Mother   . Heart disease Mother   . Diabetes Sister     Allergies: No  Known Allergies  Current Facility-Administered Medications  Medication Dose Route Frequency Provider Last Rate Last Dose  . 0.9 %  sodium chloride infusion   Intravenous Continuous Deneise Lever, MD      . aspirin EC tablet 325 mg  325 mg Oral Daily Deneise Lever, MD      . heparin injection 5,000 Units  5,000 Units Subcutaneous 3 times per day Deneise Lever, MD      . insulin aspart (novoLOG) injection 0-9 Units  0-9 Units Subcutaneous 6 times per day Deneise Lever, MD      . nitroGLYCERIN (NITROSTAT) SL tablet 0.4 mg  0.4 mg Sublingual Q5 min PRN Deneise Lever, MD       Current Outpatient Prescriptions  Medication Sig Dispense Refill  . azaTHIOprine (IMURAN) 50 MG tablet Take 100 mg by mouth daily.    . carvedilol (COREG) 12.5 MG tablet Take 1 tablet (12.5 mg total) by mouth 2 (two) times daily with a meal. 60 tablet 1  . clopidogrel (PLAVIX) 75 MG tablet Take 1 tablet (75 mg total) by mouth daily with breakfast. (Patient not taking: Reported on 04/03/2015) 30 tablet 2  . colchicine 0.6 MG tablet Take 1 tablet (0.6 mg total) by mouth 2 (two) times daily as needed (for gout flareups). 30 tablet 0  . cyclophosphamide (CYTOXAN) 50 MG tablet Take 50 mg by mouth daily. Give on an empty stomach 1 hour before or 2 hours after meals.    [name] doxycycline (VIBRAMYCIN) 100 MG capsule Take 1 capsule (100 mg total) by mouth 2 (two) times daily. 28 capsule 0  . dutasteride (AVODART) 0.5 MG capsule Take 1 capsule (0.5 mg total) by mouth daily. 30 capsule 1  . furosemide (LASIX) 40 MG tablet Take 1 tablet (40 mg total) by mouth daily. (Patient not taking: Reported on 04/03/2015) 30 tablet 2  . hydrALAZINE (APRESOLINE) 100 MG tablet Take 0.5 tablets (50 mg total) by mouth 2 (two) times daily. 60 tablet 1  . potassium chloride SA (K-DUR,KLOR-CON) 20 MEQ tablet Take 2 tablets (40 mEq total) by mouth daily. 30 tablet 1  . rosuvastatin (CRESTOR) 10 MG tablet Take 1 tablet (10 mg total) by mouth at bedtime.  30 tablet 1  . tamsulosin (FLOMAX) 0.4 MG CAPS Take 1 capsule (0.4 mg total) by mouth at bedtime. 30 capsule 1  . Tetrahydroz-Dextran-PEG-Povid (EYE DROPS ADVANCED RELIEF OP) Place 1 drop into both eyes daily.    [name] trimethoprim-polymyxin b (POLYTRIM) ophthalmic solution Place 2 drops into the left eye every 4 (four) hours. 10 mL 0  . Vitamin D, Ergocalciferol, (DRISDOL) 50000 UNITS CAPS Take 50,000 Units by mouth every 7 (seven) days. Take on sundays     Review Of Systems: Complete ROS negative except as noted above in HPI.  Physical Exam: Filed Vitals:   04/03/15 1741  BP: 152/71  Pulse:  72  Temp:   Resp: 21    General: alert, cooperative and moderately obese HEENT: PERRLA and extra ocular movement intact Heart: S1, S2 normal, no murmur, rub or gallop, regular rate and rhythm Lungs: clear to auscultation, no wheezes or rales and unlabored breathing Abdomen: abdomen is soft without significant tenderness, masses, organomegaly or guarding Extremities: extremities normal, atraumatic, no cyanosis or edema Skin:no rashes Neurology: normal without focal findings Psych: mood stable   Labs and Imaging: Lab Results  Component Value Date/Time   NA 138 04/03/2015 02:03 PM   K 4.0 04/03/2015 02:03 PM   CL 101 04/03/2015 02:03 PM   CO2 28 04/03/2015 02:03 PM   BUN 18 04/03/2015 02:03 PM   CREATININE 1.38* 04/03/2015 02:03 PM   GLUCOSE 229* 04/03/2015 02:03 PM   Lab Results  Component Value Date   WBC 4.3 04/03/2015   HGB 13.4 04/03/2015   HCT 41.0 04/03/2015   MCV 80.6 04/03/2015   PLT 136* 04/03/2015    Dg Chest 2 View  04/03/2015   CLINICAL DATA:  Productive cough for 2 weeks  EXAM: CHEST  2 VIEW  COMPARISON:  04/23/2013  FINDINGS: The heart size and mediastinal contours are within normal limits. Both lungs are clear. The visualized skeletal structures are unremarkable.  IMPRESSION: No active cardiopulmonary disease.   Electronically Signed   By: Inez Catalina M.D.   On:  04/03/2015 14:20        Greater than 50% of greater than 70 minutes spent with patient in terms of direct patient care and/or care coordination.   Imaging studies and EKG readings personally reviewed    Shanda Howells MD  Pager: [phone]

## 2015-04-03 NOTE — ED Provider Notes (Signed)
Complains of intermittent chest pain anterior, nonradiating onset 4 days ago. Pain lasts prostate 30 seconds at a time. He is presently asymptomatic. Other associated symptoms include mild cough for several days, productive of white sputum. No shortness of breath. No fever  Orlie Dakin, MD 04/03/15 1754

## 2015-04-03 NOTE — ED Notes (Signed)
Pt given turkey sandwich and sprite.  

## 2015-04-04 DIAGNOSIS — E780 Pure hypercholesterolemia: Secondary | ICD-10-CM | POA: Diagnosis not present

## 2015-04-04 DIAGNOSIS — I10 Essential (primary) hypertension: Secondary | ICD-10-CM | POA: Diagnosis not present

## 2015-04-04 DIAGNOSIS — R079 Chest pain, unspecified: Secondary | ICD-10-CM | POA: Diagnosis not present

## 2015-04-04 DIAGNOSIS — N183 Chronic kidney disease, stage 3 unspecified: Secondary | ICD-10-CM

## 2015-04-04 LAB — COMPREHENSIVE METABOLIC PANEL
ALBUMIN: 3.2 g/dL — AB (ref 3.5–5.0)
ALT: 17 U/L (ref 17–63)
AST: 17 U/L (ref 15–41)
Alkaline Phosphatase: 64 U/L (ref 38–126)
Anion gap: 7 (ref 5–15)
BUN: 15 mg/dL (ref 6–20)
CO2: 27 mmol/L (ref 22–32)
CREATININE: 1.17 mg/dL (ref 0.61–1.24)
Calcium: 8.8 mg/dL — ABNORMAL LOW (ref 8.9–10.3)
Chloride: 105 mmol/L (ref 101–111)
GFR calc Af Amer: >60 mL/min (ref >60)
GFR, EST NON AFRICAN AMERICAN: 57 mL/min — AB (ref >60)
GLUCOSE: 161 mg/dL — AB (ref 65–99)
POTASSIUM: 3.4 mmol/L — AB (ref 3.5–5.1)
Sodium: 139 mmol/L (ref 135–145)
Total Bilirubin: 0.3 mg/dL (ref 0.3–1.2)
Total Protein: 6.6 g/dL (ref 6.5–8.1)

## 2015-04-04 LAB — CBC WITH DIFFERENTIAL/PLATELET
BASOS PCT: 0 % (ref 0–1)
Basophils Absolute: 0 10*3/uL (ref 0.0–0.1)
EOS PCT: 5 % (ref 0–5)
Eosinophils Absolute: 0.2 10*3/uL (ref 0.0–0.7)
HEMATOCRIT: 39.1 % (ref 39.0–52.0)
Hemoglobin: 13 g/dL (ref 13.0–17.0)
Lymphocytes Relative: 45 % (ref 12–46)
Lymphs Abs: 1.6 10*3/uL (ref 0.7–4.0)
MCH: 26.5 pg (ref 26.0–34.0)
MCHC: 33.2 g/dL (ref 30.0–36.0)
MCV: 79.8 fL (ref 78.0–100.0)
Monocytes Absolute: 0.3 10*3/uL (ref 0.1–1.0)
Monocytes Relative: 7 % (ref 3–12)
Neutro Abs: 1.5 10*3/uL — ABNORMAL LOW (ref 1.7–7.7)
Neutrophils Relative %: 43 % (ref 43–77)
PLATELETS: 127 10*3/uL — AB (ref 150–400)
RBC: 4.9 MIL/uL (ref 4.22–5.81)
RDW: 15 % (ref 11.5–15.5)
WBC: 3.6 10*3/uL — AB (ref 4.0–10.5)

## 2015-04-04 LAB — HEMOGLOBIN A1C
Hgb A1c MFr Bld: 8.8 % — ABNORMAL HIGH (ref 4.8–5.6)
Mean Plasma Glucose: 206 mg/dL

## 2015-04-04 LAB — GLUCOSE, CAPILLARY
GLUCOSE-CAPILLARY: 146 mg/dL — AB (ref 65–99)
Glucose-Capillary: 133 mg/dL — ABNORMAL HIGH (ref 65–99)

## 2015-04-04 LAB — TROPONIN I
Troponin I: <0.03 ng/mL (ref <0.031)
Troponin I: <0.03 ng/mL (ref <0.031)

## 2015-04-04 MED ORDER — POTASSIUM CHLORIDE CRYS ER 20 MEQ PO TBCR
40.0000 meq | EXTENDED_RELEASE_TABLET | Freq: Once | ORAL | Status: AC
  Administered 2015-04-04: 40 meq via ORAL
  Filled 2015-04-04: qty 2

## 2015-04-04 MED ORDER — INSULIN ASPART 100 UNIT/ML ~~LOC~~ SOLN: 0.0000 [IU] | Freq: Three times a day (TID) | SUBCUTANEOUS | Status: DC

## 2015-04-04 NOTE — Discharge Summary (Signed)
Physician Discharge Summary  Ryan Jimenez [id number] DOB: Mar 16, 1932 DOA: 04/03/2015  PCP: Philis Fendt, MD  Admit date: 04/03/2015 Discharge date: 04/04/2015  Time spent: Greater than 30 minutes  1.   Discharge Condition: stable Diet recommendation: heart healthy, low sodium, diabetic  Discharge Diagnoses:  Principal Problem:   Chest pain Active Problems:   Hypercholesterolemia   Hypertension   Type 2 diabetes mellitus   CVA (cerebral infarction)   CKD (chronic kidney disease) stage 3, GFR 30-59 ml/min   History of present illness:  This is a 81 y.o. year old male with significant past medical history of HTN, DM, stage 3-4 CKD, CVA presenting with chest pain and a cough. The patient states that he was treated for pneumonia back in February and ever since then the cough has lingered. He essentially wanted to come to the hospital to be sure that he did not have continued pneumonia. Chest x-ray was clear. He also complained of discomfort in his chest which he describes as present in the center of the chest, feeling like an ache, nonradiating, 4-5/10 in intensity. It lasts for less than a minute and then resolves and is not associated with any shortness of breath palpitations or nausea. He has some chronic dyspnea on exertion which is not any worse. Workup in the ER revealed clear chest x-ray as mentioned above,  EKG was unchanged from his baseline and a negative POC troponin.  Hospital Course:  Chest pain -Very atypical for coronary syndrome. 3 sets of cardiac enzymes have remained negative. He has not had any recurrence of chest pain overnight. At this time he is stable to be discharged without any further workup -Can continue taking a baby aspirin as he was before -He is advised if the pain changes, increases in intensity duration or is associated with other symptoms, he should come back to the hospital  History of CVA -Continue aspirin  Insulin-dependent diabetes  mellitus -Continue home doses of insulin  -Hypertension -Continue amlodipine and carvedilol  CKD 3 -Stable    Discharge Exam: Filed Weights   04/03/15 1345  Weight: 107.412 kg (236 lb 12.8 oz)   Filed Vitals:   04/04/15 0828  BP: 146/68  Pulse: 60  Temp:   Resp:     General: AAO x 3, no distress Cardiovascular: RRR, no murmurs  Respiratory: clear to auscultation bilaterally GI: soft, non-tender, non-distended, bowel sound positive  Discharge Instructions You were cared for by a hospitalist during your hospital stay. If you have any questions about your discharge medications or the care you received while you were in the hospital after you are discharged, you can call the unit and asked to speak with the hospitalist on call if the hospitalist that took care of you is not available. Once you are discharged, your primary care physician will handle any further medical issues. Please note that NO REFILLS for any discharge medications will be authorized once you are discharged, as it is imperative that you return to your primary care physician (or establish a relationship with a primary care physician if you do not have one) for your aftercare needs so that they can reassess your need for medications and monitor your lab values.      Discharge Instructions    Discharge instructions    Complete by:  As directed   Diabetic diet, low sodium, heart healthy and renal diet     Increase activity slowly    Complete by:  As directed  Medication List    TAKE these medications        amLODipine 10 MG tablet  Commonly known as:  NORVASC  Take 10 mg by mouth daily.     aspirin EC 81 MG tablet  Take 81 mg by mouth daily.     carvedilol 12.5 MG tablet  Commonly known as:  COREG  Take 1 tablet (12.5 mg total) by mouth 2 (two) times daily with a meal.     colchicine 0.6 MG tablet  Take 1 tablet (0.6 mg total) by mouth 2 (two) times daily as needed (for gout flareups).      dutasteride 0.5 MG capsule  Commonly known as:  AVODART  Take 1 capsule (0.5 mg total) by mouth daily.     EYE DROPS ADVANCED RELIEF OP  Place 1 drop into both eyes daily as needed (dry eyes).     insulin aspart 100 UNIT/ML injection  Commonly known as:  novoLOG  Inject 4-10 Units into the skin 3 (three) times daily as needed for high blood sugar. About 180 = 4 units, around 400 = 10 units     LEVEMIR FLEXTOUCH 100 UNIT/ML Pen  Generic drug:  Insulin Detemir  Inject 13-23 Units into the skin at bedtime as needed (high blood sugar). Under 300 = 13 units, over 300 = 23 units     rosuvastatin 10 MG tablet  Commonly known as:  CRESTOR  Take 1 tablet (10 mg total) by mouth at bedtime.     tamsulosin 0.4 MG Caps capsule  Commonly known as:  FLOMAX  Take 1 capsule (0.4 mg total) by mouth at bedtime.     Vitamin D (Ergocalciferol) 50000 UNITS Caps capsule  Commonly known as:  DRISDOL  Take 50,000 Units by mouth every 7 (seven) days. Take on sundays       No Known Allergies    The results of significant diagnostics from this hospitalization (including imaging, microbiology, ancillary and laboratory) are listed below for reference.    Significant Diagnostic Studies: Dg Chest 2 View  04/03/2015   CLINICAL DATA:  Productive cough for 2 weeks  EXAM: CHEST  2 VIEW  COMPARISON:  04/23/2013  FINDINGS: The heart size and mediastinal contours are within normal limits. Both lungs are clear. The visualized skeletal structures are unremarkable.  IMPRESSION: No active cardiopulmonary disease.   Electronically Signed   By: Inez Catalina M.D.   On: 04/03/2015 14:20    Microbiology: No results found for this or any previous visit (from the past 240 hour(s)).   Labs: Basic Metabolic Panel:  Recent Labs Lab 04/03/15 1403 04/03/15 1850 04/04/15 0509  NA 138  --  139  K 4.0  --  3.4*  CL 101  --  105  CO2 28  --  27  GLUCOSE 229*  --  161*  BUN 18  --  15  CREATININE 1.38* 1.38* 1.17   CALCIUM 9.3  --  8.8*   Liver Function Tests:  Recent Labs Lab 04/04/15 0509  AST 17  ALT 17  ALKPHOS 64  BILITOT 0.3  PROT 6.6  ALBUMIN 3.2*   No results for input(s): LIPASE, AMYLASE in the last 168 hours. No results for input(s): AMMONIA in the last 168 hours. CBC:  Recent Labs Lab 04/03/15 1403 04/03/15 1850 04/04/15 0509  WBC 4.3 4.5 3.6*  NEUTROABS  --   --  1.5*  HGB 13.4 13.6 13.0  HCT 41.0 40.4 39.1  MCV 80.6 80.5 79.8  PLT 136* 132* 127*   Cardiac Enzymes:  Recent Labs Lab 04/03/15 1850 04/04/15 0015 04/04/15 0643  TROPONINI <0.03 <0.03 <0.03   BNP: BNP (last 3 results)  Recent Labs  04/03/15 1403  BNP 30.8    ProBNP (last 3 results) No results for input(s): PROBNP in the last 8760 hours.  CBG:  Recent Labs Lab 04/03/15 2104 04/03/15 2343 04/04/15 0520 04/04/15 0732  GLUCAP 260* 284* 146* 133*       SignedDebbe Odea, MD [facility] 04/04/2015, 8:48 AM

## 2015-04-04 NOTE — Progress Notes (Signed)
NO complaints of Chest Pain or Shortness of breath this shift.     Patient Q4hr CBG. Patients BG has been rising this shift as family brought the patient a LARGE meal from [facility] which he completed as this RN was rounding in his room. Pt and family educated on Cardiac/Consistent Carb diet orders. Patient made NPO at midnight, per protocol for Cardiology Rounds in am. Will continue to monitor.

## 2015-04-14 IMAGING — US US RENAL
1 series · 14 of 25 positions shown · non-contrast
Comparison: Multiple exams, including 11/24/2011 and 10/14/2008

CLINICAL DATA: Stage III chronic renal disease. Hematuria.
Diabetes. Glomerulonephritis.

EXAM:
RENAL/URINARY TRACT ULTRASOUND COMPLETE

[Series 1: us renal · 0.26mm/px · 14 of 34 slices shown]
[im 1/34]
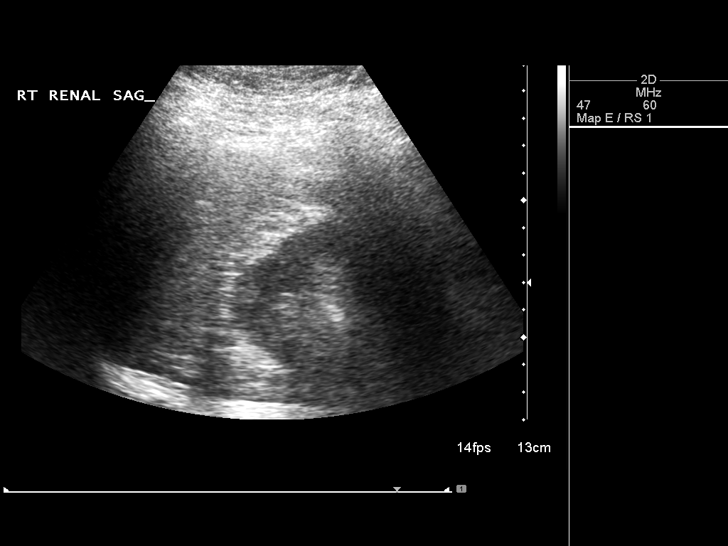
[im 3/34]
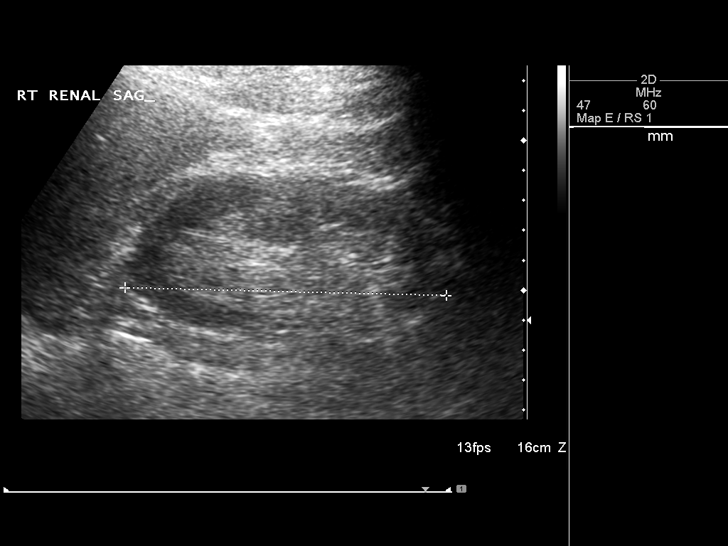
[im 6/34]
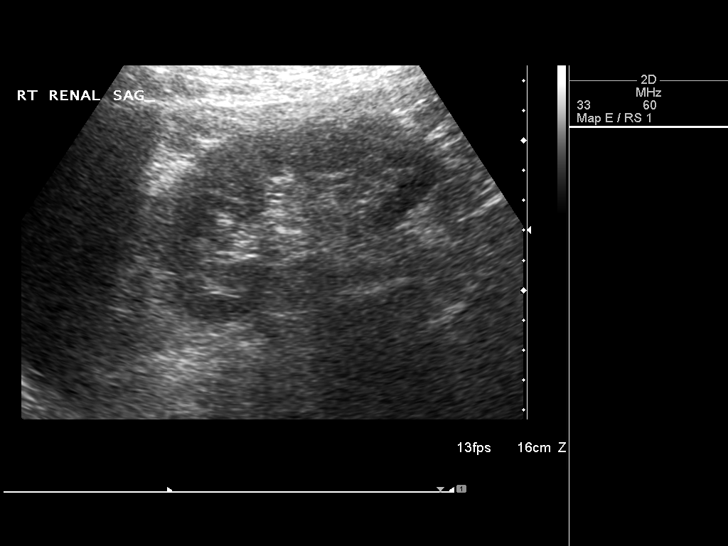
[im 9/34]
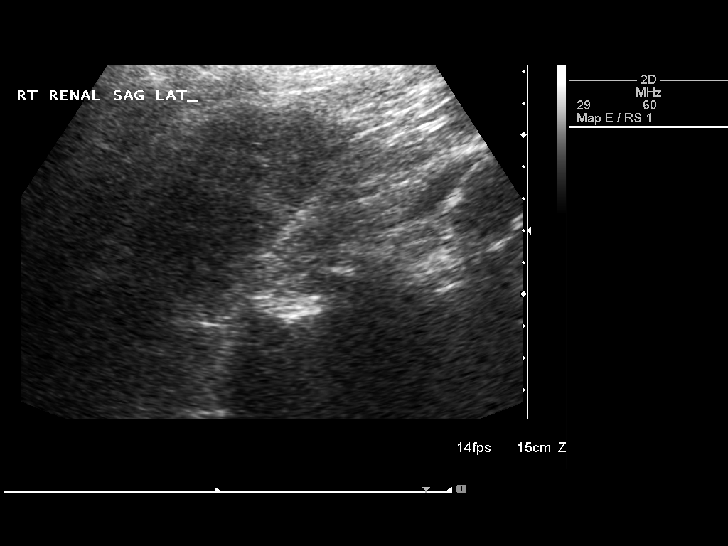
[im 12/34]
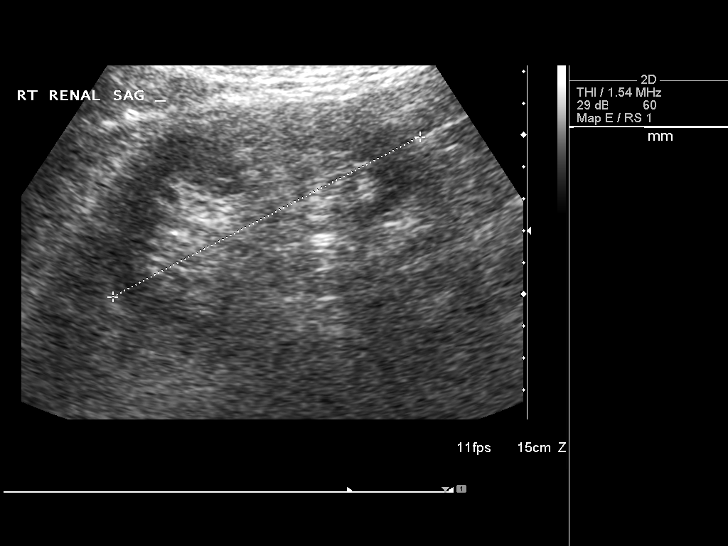
[im 13/34]
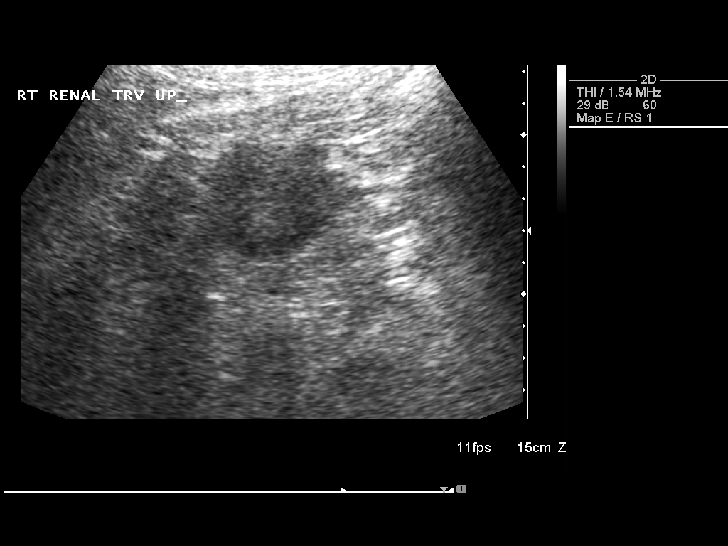
[im 16/34]
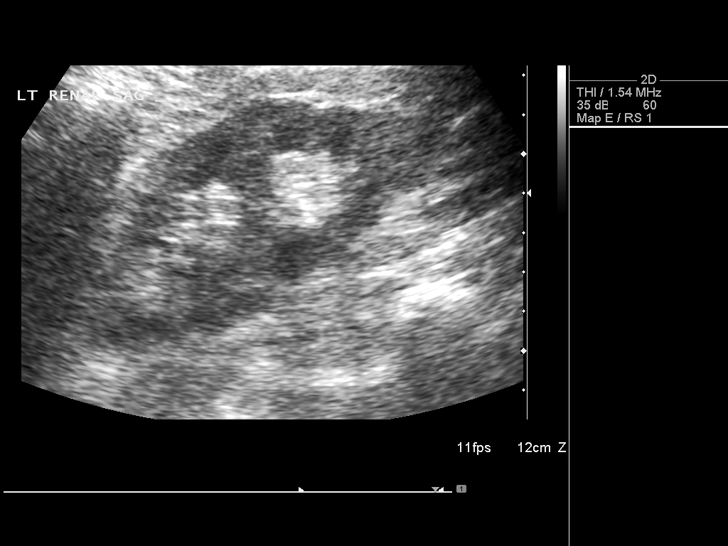
[im 18/34]
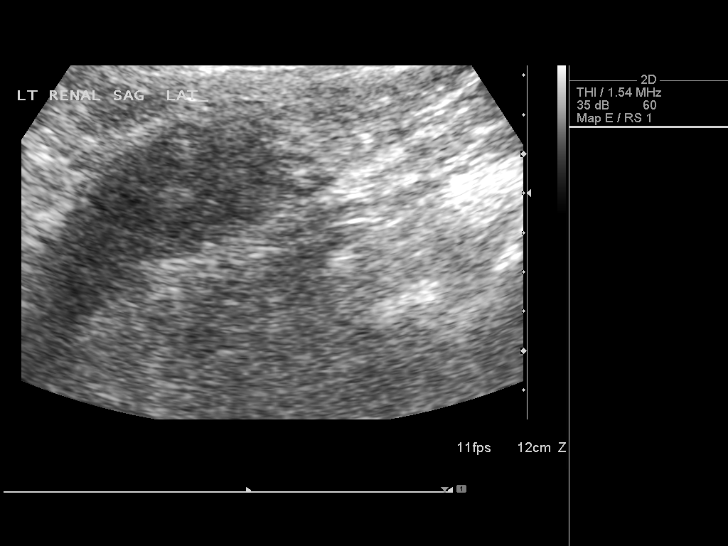
[im 21/34]
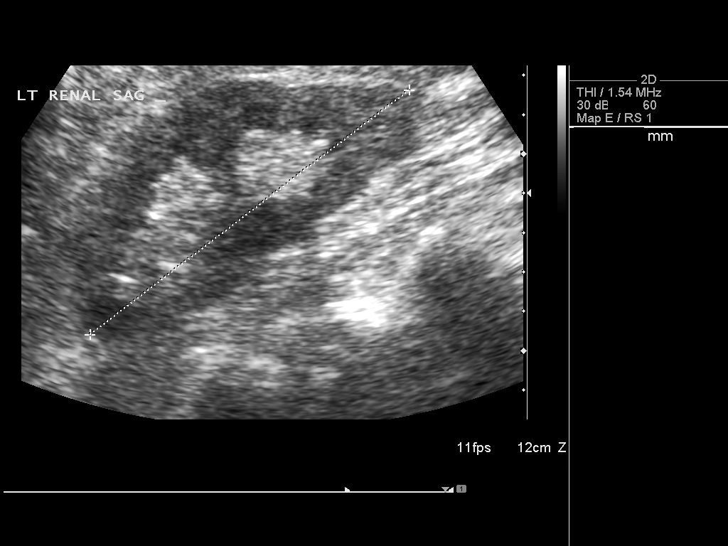
[im 23/34]
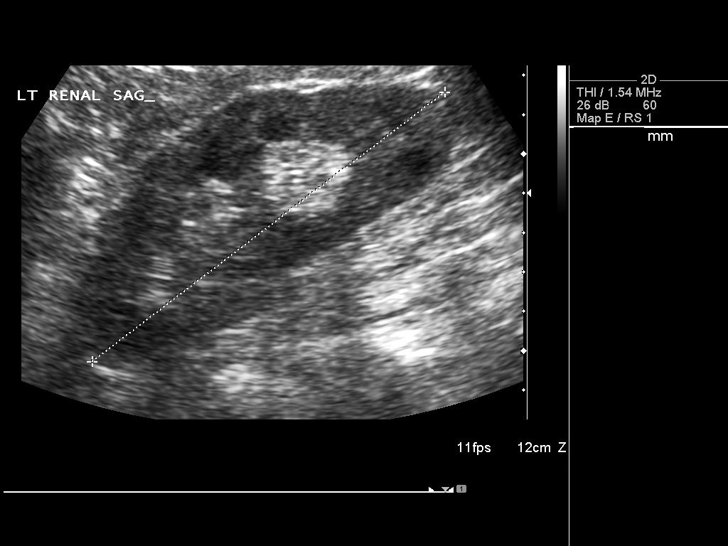
[im 25/34]
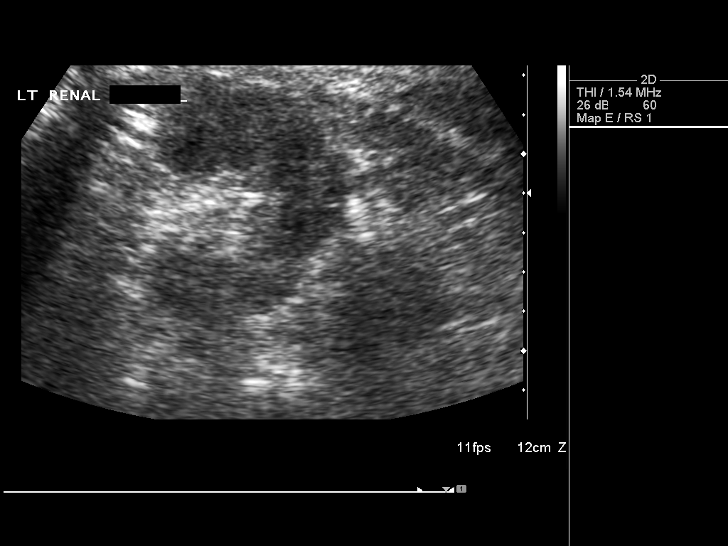
[im 28/34]
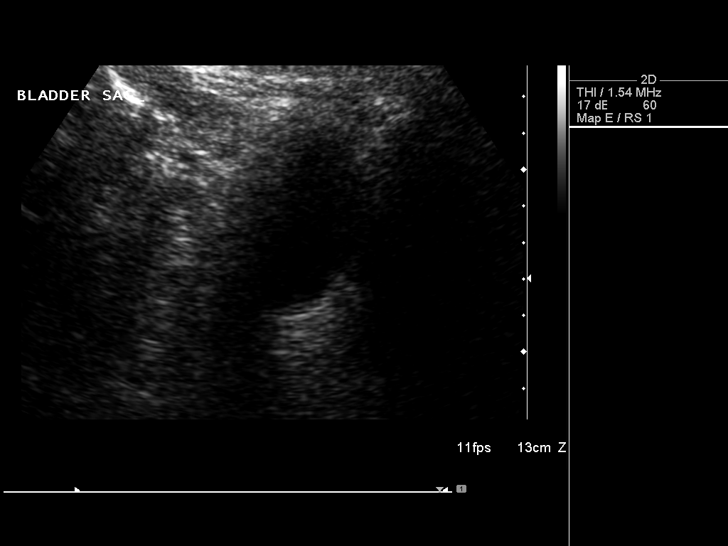
[im 31/34]
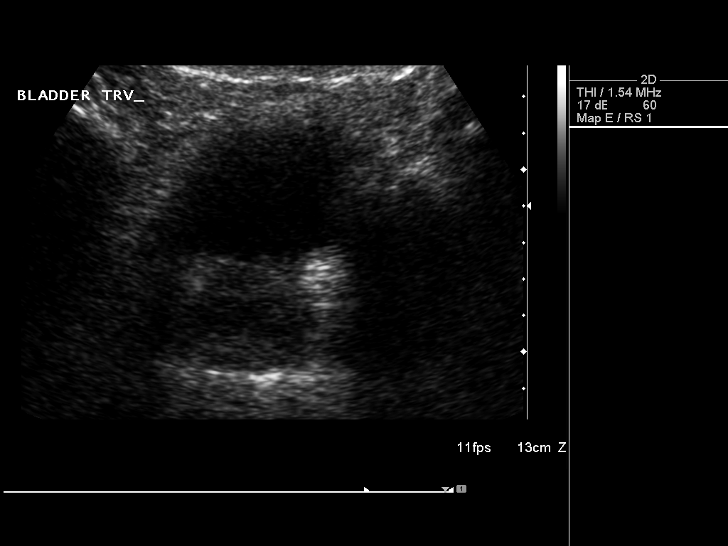
[im 34/34]
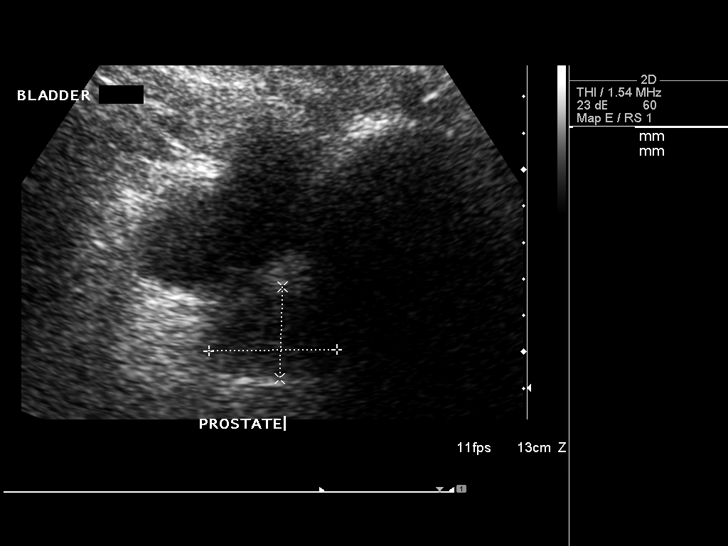

[14 of 25 positions shown; findings below may reference images not displayed]

FINDINGS: Right Kidney:

Length: 10.9 cm. Echogenicity within normal limits. No mass or
hydronephrosis visualized.

Left Kidney:

Length: 11.3 cm. Echogenicity within normal limits. No mass or
hydronephrosis visualized.

Bladder:

Appears normal for degree of bladder distention. Prostate gland
measured at 3.5 by 2.5 by 3.7 cm.
IMPRESSION: 1. No specific sonographic abnormality of the kidneys is identified
to correlate with the patient's renal dysfunction. No renal calculi
are identified.

## 2015-05-27 ENCOUNTER — Encounter: Payer: Self-pay | Admitting: Podiatry

## 2015-05-27 ENCOUNTER — Ambulatory Visit (INDEPENDENT_AMBULATORY_CARE_PROVIDER_SITE_OTHER): Payer: Medicare Other | Admitting: Podiatry

## 2015-05-27 DIAGNOSIS — B351 Tinea unguium: Secondary | ICD-10-CM

## 2015-05-27 DIAGNOSIS — E1140 Type 2 diabetes mellitus with diabetic neuropathy, unspecified: Secondary | ICD-10-CM

## 2015-05-27 DIAGNOSIS — E1149 Type 2 diabetes mellitus with other diabetic neurological complication: Secondary | ICD-10-CM

## 2015-05-27 DIAGNOSIS — M79676 Pain in unspecified toe(s): Secondary | ICD-10-CM | POA: Diagnosis not present

## 2015-05-27 DIAGNOSIS — L600 Ingrowing nail: Secondary | ICD-10-CM

## 2015-05-27 NOTE — Progress Notes (Signed)
   Subjective:    Patient ID: Ryan Jimenez, male    DOB: 1932-08-18, 81 y.o.   MRN: [id number]  HPI 81 year old male presents the office today with complaints of thick, painful, elongated toenails which she is unable to trim himself. He states in particular his left big toenails painful particularly outside corner towards the end of the toenail. He denies any redness or drainage from the nail sites. States his nails are painful particularly with pressure. He is diabetic and states his last blood sugar was 220. His last HbA1c was 8.8. He also does that he gets some numbness and tingling to his feet however he has not been on medication for neuropathy. He states that is now on the start on medication. Denies any history of ulceration. No other complaints at this time.   Review of Systems  Musculoskeletal: Positive for myalgias.  Skin: Positive for color change.  All other systems reviewed and are negative.      Objective:   Physical Exam AAO x3, NAD DP/PT pulses palpable bilaterally, CRT less than 3 seconds Protective sensation decreased with Simms Weinstein monofilament, Achilles tendon reflex intact Nails are hypertrophic, dystrophic, discolored, brittle, elongated 10. In particular left lateral hallux nail border centered to palpation along the distal aspect. There is no swelling erythema or drainage along the nail sites. Nails are painful 1-5 bilaterally. No other areas of tenderness to bilateral lower extremities. MMT 5/5, ROM WNL.  No open lesions or pre-ulcerative lesions.  No overlying edema, erythema, increase in warmth to bilateral lower extremities.  No pain with calf compression, swelling, warmth, erythema bilaterally.      Assessment & Plan:  81 year old male with symptomatic onychomycosis, ingrown toenail left lateral hallux -Treatment options discussed including all alternatives, risks, and complications -Nail shop and debrided 10 without consultation/bleeding. Left  lateral hallux nail sharply debrided to remove the offending ingrown toenail border without any complications/bleeding. Upon debridement there is resolution of symptoms. -Discussed the importance of daily foot inspection. -Patient was inquiring about soaking his foot. Discussed with him due to his neuropathy should hold off on this. -Follow-up 3 months or sooner if any problems arise. In the meantime, encouraged to call the office with any questions, concerns, change in symptoms.   Celesta Gentile, DPM

## 2015-07-30 ENCOUNTER — Encounter: Payer: Self-pay | Admitting: Cardiology

## 2015-08-28 ENCOUNTER — Ambulatory Visit: Payer: Medicare Other | Admitting: Podiatry

## 2016-04-29 ENCOUNTER — Encounter (HOSPITAL_COMMUNITY): Payer: Self-pay | Admitting: *Deleted

## 2016-04-29 ENCOUNTER — Emergency Department (HOSPITAL_COMMUNITY)
Admission: EM | Admit: 2016-04-29 | Discharge: 2016-04-29 | Disposition: A | Discharge status: Home / Self Care | Payer: Medicare Other | Attending: Emergency Medicine | Admitting: Emergency Medicine

## 2016-04-29 ENCOUNTER — Emergency Department (HOSPITAL_COMMUNITY): Payer: Medicare Other

## 2016-04-29 DIAGNOSIS — Y999 Unspecified external cause status: Secondary | ICD-10-CM | POA: Diagnosis not present

## 2016-04-29 DIAGNOSIS — Z79899 Other long term (current) drug therapy: Secondary | ICD-10-CM | POA: Insufficient documentation

## 2016-04-29 DIAGNOSIS — N189 Chronic kidney disease, unspecified: Secondary | ICD-10-CM | POA: Insufficient documentation

## 2016-04-29 DIAGNOSIS — I129 Hypertensive chronic kidney disease with stage 1 through stage 4 chronic kidney disease, or unspecified chronic kidney disease: Secondary | ICD-10-CM | POA: Insufficient documentation

## 2016-04-29 DIAGNOSIS — E7800 Pure hypercholesterolemia, unspecified: Secondary | ICD-10-CM | POA: Insufficient documentation

## 2016-04-29 DIAGNOSIS — Y939 Activity, unspecified: Secondary | ICD-10-CM | POA: Insufficient documentation

## 2016-04-29 DIAGNOSIS — Z87891 Personal history of nicotine dependence: Secondary | ICD-10-CM | POA: Insufficient documentation

## 2016-04-29 DIAGNOSIS — E1122 Type 2 diabetes mellitus with diabetic chronic kidney disease: Secondary | ICD-10-CM | POA: Insufficient documentation

## 2016-04-29 DIAGNOSIS — Z794 Long term (current) use of insulin: Secondary | ICD-10-CM | POA: Diagnosis not present

## 2016-04-29 DIAGNOSIS — Y929 Unspecified place or not applicable: Secondary | ICD-10-CM | POA: Diagnosis not present

## 2016-04-29 DIAGNOSIS — S161XXA Strain of muscle, fascia and tendon at neck level, initial encounter: Secondary | ICD-10-CM | POA: Diagnosis not present

## 2016-04-29 DIAGNOSIS — X58XXXA Exposure to other specified factors, initial encounter: Secondary | ICD-10-CM | POA: Insufficient documentation

## 2016-04-29 DIAGNOSIS — Z7982 Long term (current) use of aspirin: Secondary | ICD-10-CM | POA: Diagnosis not present

## 2016-04-29 DIAGNOSIS — R51 Headache: Secondary | ICD-10-CM | POA: Diagnosis present

## 2016-04-29 LAB — DIFFERENTIAL
BASOS PCT: 0 %
Basophils Absolute: 0 10*3/uL (ref 0.0–0.1)
Eosinophils Absolute: 0.3 10*3/uL (ref 0.0–0.7)
Eosinophils Relative: 6 %
LYMPHS PCT: 38 %
Lymphs Abs: 1.6 10*3/uL (ref 0.7–4.0)
MONO ABS: 0.3 10*3/uL (ref 0.1–1.0)
Monocytes Relative: 7 %
Neutro Abs: 2.1 10*3/uL (ref 1.7–7.7)
Neutrophils Relative %: 49 %

## 2016-04-29 LAB — CBC
HCT: 42.2 % (ref 39.0–52.0)
Hemoglobin: 13.8 g/dL (ref 13.0–17.0)
MCH: 25.8 pg — AB (ref 26.0–34.0)
MCHC: 32.7 g/dL (ref 30.0–36.0)
MCV: 79 fL (ref 78.0–100.0)
PLATELETS: 148 10*3/uL — AB (ref 150–400)
RBC: 5.34 MIL/uL (ref 4.22–5.81)
RDW: 14.7 % (ref 11.5–15.5)
WBC: 4.4 10*3/uL (ref 4.0–10.5)

## 2016-04-29 LAB — COMPREHENSIVE METABOLIC PANEL
ALK PHOS: 62 U/L (ref 38–126)
ALT: 17 U/L (ref 17–63)
AST: 16 U/L (ref 15–41)
Albumin: 3.6 g/dL (ref 3.5–5.0)
Anion gap: 9 (ref 5–15)
BUN: 17 mg/dL (ref 6–20)
CALCIUM: 9.1 mg/dL (ref 8.9–10.3)
CHLORIDE: 104 mmol/L (ref 101–111)
CO2: 25 mmol/L (ref 22–32)
CREATININE: 1.3 mg/dL — AB (ref 0.61–1.24)
GFR, EST AFRICAN AMERICAN: 58 mL/min — AB (ref >60)
GFR, EST NON AFRICAN AMERICAN: 50 mL/min — AB (ref >60)
Glucose, Bld: 234 mg/dL — ABNORMAL HIGH (ref 65–99)
Potassium: 3.5 mmol/L (ref 3.5–5.1)
Sodium: 138 mmol/L (ref 135–145)
Total Bilirubin: 0.6 mg/dL (ref 0.3–1.2)
Total Protein: 7.2 g/dL (ref 6.5–8.1)

## 2016-04-29 LAB — CBG MONITORING, ED: Glucose-Capillary: 165 mg/dL — ABNORMAL HIGH (ref 65–99)

## 2016-04-29 MED ORDER — METHOCARBAMOL 500 MG PO TABS: 500.0000 mg | ORAL_TABLET | Freq: Every evening | ORAL | Status: DC | PRN

## 2016-04-29 NOTE — ED Notes (Signed)
CBG is 165 

## 2016-04-29 NOTE — ED Notes (Signed)
Patient able to ambulate independently  

## 2016-04-29 NOTE — ED Notes (Signed)
Pt reports pressure to back of head for several days. Had episode this morning and went to firestation, had sbp >200. Reports pain has decreased, bp is 140/79 at triage. No neuro deficits noted.

## 2016-04-29 NOTE — Discharge Instructions (Signed)
Cervical Sprain  A cervical sprain is an injury in the neck in which the strong, fibrous tissues (ligaments) that connect your neck bones stretch or tear. Cervical sprains can range from mild to severe. Severe cervical sprains can cause the neck vertebrae to be unstable. This can lead to damage of the spinal cord and can result in serious nervous system problems. The amount of time it takes for a cervical sprain to get better depends on the cause and extent of the injury. Most cervical sprains heal in 1 to 3 weeks.  CAUSES   Severe cervical sprains may be caused by:    Contact sport injuries (such as from football, rugby, wrestling, hockey, auto racing, gymnastics, diving, martial arts, or boxing).    Motor vehicle collisions.    Whiplash injuries. This is an injury from a sudden forward and backward whipping movement of the head and neck.   Falls.   Mild cervical sprains may be caused by:    Being in an awkward position, such as while cradling a telephone between your ear and shoulder.    Sitting in a chair that does not offer proper support.    Working at a poorly designed computer station.    Looking up or down for long periods of time.   SYMPTOMS    Pain, soreness, stiffness, or a burning sensation in the front, back, or sides of the neck. This discomfort may develop immediately after the injury or slowly, 24 hours or more after the injury.    Pain or tenderness directly in the middle of the back of the neck.    Shoulder or upper back pain.    Limited ability to move the neck.    Headache.    Dizziness.    Weakness, numbness, or tingling in the hands or arms.    Muscle spasms.    Difficulty swallowing or chewing.    Tenderness and swelling of the neck.   DIAGNOSIS   Most of the time your health care provider can diagnose a cervical sprain by taking your history and doing a physical exam. Your health care provider will ask about previous neck injuries and any known neck  problems, such as arthritis in the neck. X-rays may be taken to find out if there are any other problems, such as with the bones of the neck. Other tests, such as a CT scan or MRI, may also be needed.   TREATMENT   Treatment depends on the severity of the cervical sprain. Mild sprains can be treated with rest, keeping the neck in place (immobilization), and pain medicines. Severe cervical sprains are immediately immobilized. Further treatment is done to help with pain, muscle spasms, and other symptoms and may include:   Medicines, such as pain relievers, numbing medicines, or muscle relaxants.    Physical therapy. This may involve stretching exercises, strengthening exercises, and posture training. Exercises and improved posture can help stabilize the neck, strengthen muscles, and help stop symptoms from returning.   HOME CARE INSTRUCTIONS    Put ice on the injured area.     Put ice in a plastic bag.     Place a towel between your skin and the bag.     Leave the ice on for 15-20 minutes, 3-4 times a day.    If your injury was severe, you may have been given a cervical collar to wear. A cervical collar is a two-piece collar designed to keep your neck from moving while it heals.      Do not remove the collar unless instructed by your health care provider.    If you have long hair, keep it outside of the collar.    Ask your health care provider before making any adjustments to your collar. Minor adjustments may be required over time to improve comfort and reduce pressure on your chin or on the back of your head.    Ifyou are allowed to remove the collar for cleaning or bathing, follow your health care provider's instructions on how to do so safely.    Keep your collar clean by wiping it with mild soap and water and drying it completely. If the collar you have been given includes removable pads, remove them every 1-2 days and hand wash them with soap and water. Allow them to air dry. They should be completely  dry before you wear them in the collar.    If you are allowed to remove the collar for cleaning and bathing, wash and dry the skin of your neck. Check your skin for irritation or sores. If you see any, tell your health care provider.    Do not drive while wearing the collar.    Only take over-the-counter or prescription medicines for pain, discomfort, or fever as directed by your health care provider.    Keep all follow-up appointments as directed by your health care provider.    Keep all physical therapy appointments as directed by your health care provider.    Make any needed adjustments to your workstation to promote good posture.    Avoid positions and activities that make your symptoms worse.    Warm up and stretch before being active to help prevent problems.   SEEK MEDICAL CARE IF:    Your pain is not controlled with medicine.    You are unable to decrease your pain medicine over time as planned.    Your activity level is not improving as expected.   SEEK IMMEDIATE MEDICAL CARE IF:    You develop any bleeding.   You develop stomach upset.   You have signs of an allergic reaction to your medicine.    Your symptoms get worse.    You develop new, unexplained symptoms.    You have numbness, tingling, weakness, or paralysis in any part of your body.   MAKE SURE YOU:    Understand these instructions.   Will watch your condition.   Will get help right away if you are not doing well or get worse.     This information is not intended to replace advice given to you by your health care provider. Make sure you discuss any questions you have with your health care provider.     Document Released: 08/28/2007 Document Revised: 11/05/2013 Document Reviewed: 05/08/2013  Elsevier Interactive Patient Education 2016 [employer]

## 2016-04-29 NOTE — ED Notes (Signed)
PA at bedside.

## 2016-04-29 NOTE — ED Notes (Signed)
PA at bedside updating pt 

## 2016-04-29 NOTE — ED Notes (Signed)
Gave pt meal bag & soda.

## 2016-04-30 NOTE — ED Provider Notes (Signed)
CSN: [id number]     Arrival date & time 04/29/16  1353 History   First MD Initiated Contact with Patient 04/29/16 1657     Chief Complaint  Patient presents with  . Headache  . Hypertension   HPI Comments: 82 year old male who presents with "pressure" in the back of his head for the last 4 days. PMH significant for HTN, HLD, and DM. He states he wakes up with the pressure every morning and throughout the day it becomes better. Today was the first time the pain has not gotten better throughout the day. He feels in the in the posterior aspect of his head on the left side. He has not taken anything for pain - he states he was instructed to not take Ibuprofen or Tylenol. He was concerned that his blood pressure was high so he went to the [facility] [employer] to have it checked and his reported SBP was in the 200s. He states his blood pressure meds were recently changed by his kidney doctor. Deneis fever, LOC, dizziness, vision changes, ataxia, unilateral weakness, sudden loss of vision, aphasia, chest pain, SOB, abdominal pain, N/V.   Patient is a 82 y.o. male presenting with headaches and hypertension.  Headache Associated symptoms: no dizziness, no neck pain, no neck stiffness, no numbness and no weakness   Hypertension Associated symptoms include headaches. Pertinent negatives include no neck pain, numbness or weakness.    Past Medical History  Diagnosis Date  . Hypertension   . Diabetes mellitus   . Hypercholesterolemia   . Gout   . Shortness of breath on exertion   . Bronchitis   . Arthritis   . Near syncope 10/2011; 11/2011  . Renal disorder   . CKD (chronic kidney disease)   . Glomerulonephritis   . Pancytopenia   . Anemia    Past Surgical History  Procedure Laterality Date  . Hydrocele excision    . Cataract extraction w/ intraocular lens implant  ~ 2009    right   Family History  Problem Relation Age of Onset  . Hypertension Mother   . Heart disease Mother   . Diabetes Sister     Social History  Substance Use Topics  . Smoking status: Former Smoker -- 1.00 packs/day for 40 years    Types: Cigarettes  . Smokeless tobacco: Never Used  . Alcohol Use: No     Comment: 11/23/11 "couple ounces q month maybe"    Review of Systems  Musculoskeletal: Negative for neck pain and neck stiffness.  Neurological: Positive for headaches. Negative for dizziness, syncope, speech difficulty, weakness and numbness.      Allergies  Amlodipine and Clonidine derivatives  Home Medications   Prior to Admission medications   Medication Sig Start Date End Date Taking? Authorizing Provider  aspirin EC 81 MG tablet Take 81 mg by mouth daily.   Yes Historical Provider, MD  carvedilol (COREG) 12.5 MG tablet Take 1 tablet (12.5 mg total) by mouth 2 (two) times daily with a meal. 05/02/13  Yes Daniel J Angiulli, PA-C  colchicine 0.6 MG tablet Take 0.6 mg by mouth daily.   Yes Historical Provider, MD  insulin aspart (NOVOLOG) 100 UNIT/ML injection Inject 4-10 Units into the skin 3 (three) times daily as needed for high blood sugar. About 180 = 4 units, around 400 = 10 units   Yes Historical Provider, MD  LEVEMIR FLEXTOUCH 100 UNIT/ML Pen Inject 25-30 Units into the skin at bedtime as needed (high blood sugar). Per  sliding scale 01/30/15  Yes Historical Provider, MD  losartan (COZAAR) 50 MG tablet Take 50 mg by mouth 2 (two) times daily.   Yes Historical Provider, MD  rosuvastatin (CRESTOR) 10 MG tablet Take 1 tablet (10 mg total) by mouth at bedtime. 05/02/13  Yes Daniel J Angiulli, PA-C  tamsulosin (FLOMAX) 0.4 MG CAPS Take 1 capsule (0.4 mg total) by mouth at bedtime. 05/02/13  Yes Daniel J Angiulli, PA-C  Tetrahydroz-Dextran-PEG-Povid (EYE DROPS ADVANCED RELIEF OP) Place 1 drop into both eyes daily as needed (dry eyes).    Yes Historical Provider, MD  Vitamin D, Ergocalciferol, (DRISDOL) 50000 UNITS CAPS Take 50,000 Units by mouth every 7 (seven) days. Take on sundays   Yes Historical Provider,  MD  methocarbamol (ROBAXIN) 500 MG tablet Take 1 tablet (500 mg total) by mouth at bedtime as needed for muscle spasms. 04/29/16   Recardo Evangelist, PA-C   BP 152/91 mmHg  Pulse 61  Temp(Src) 97.7 F (36.5 C) (Oral)  Resp 20  SpO2 97%   Physical Exam  Constitutional: He is oriented to person, place, and time. He appears well-developed and well-nourished. No distress.  HENT:  Head: Normocephalic and atraumatic.  Eyes: Conjunctivae are normal. Pupils are equal, round, and reactive to light. Right eye exhibits no discharge. Left eye exhibits no discharge. No scleral icterus.  Neck: Normal range of motion. Neck supple.  No mid-line C-spine tenderness. Tenderness to palpation of left cervical paraspinal muscle  Cardiovascular: Normal rate.   Pulmonary/Chest: Effort normal. No respiratory distress.  Abdominal: Soft. He exhibits no distension.  Neurological: He is alert and oriented to person, place, and time.  Mental Status:  Alert, oriented, thought content appropriate, able to give a coherent history. Speech fluent without evidence of aphasia. Able to follow 2 step commands without difficulty.  Cranial Nerves:  II:  Peripheral visual fields grossly normal, pupils equal, round, reactive to light III,IV, VI: ptosis not present, extra-ocular motions intact bilaterally  V,VII: smile symmetric, facial light touch sensation equal VIII: hearing grossly normal to voice  X: uvula elevates symmetrically  XI: bilateral shoulder shrug symmetric and strong XII: midline tongue extension without fassiculations Motor:  Normal tone. 5/5 in upper and lower extremities bilaterally including strong and equal grip strength and dorsiflexion/plantar flexion Sensory: Pinprick and light touch normal in all extremities.  Deep Tendon Reflexes: 2+ and symmetric in the biceps and patella Cerebellar: normal finger-to-nose with bilateral upper extremities Gait: normal gait and balance CV: distal pulses palpable  throughout     Skin: Skin is warm and dry.  Psychiatric: He has a normal mood and affect.    ED Course  Procedures (including critical care time) Labs Review Labs Reviewed  CBC - Abnormal; Notable for the following:    MCH 25.8 (*)    Platelets 148 (*)    All other components within normal limits  COMPREHENSIVE METABOLIC PANEL - Abnormal; Notable for the following:    Glucose, Bld 234 (*)    Creatinine, Ser 1.30 (*)    GFR calc non Af Amer 50 (*)    GFR calc Af Amer 58 (*)    All other components within normal limits  CBG MONITORING, ED - Abnormal; Notable for the following:    Glucose-Capillary 165 (*)    All other components within normal limits  DIFFERENTIAL    Imaging Review Ct Head Wo Contrast  04/29/2016  CLINICAL DATA:  Headache. EXAM: CT HEAD WITHOUT CONTRAST TECHNIQUE: Contiguous axial images were obtained from  the base of the skull through the vertex without intravenous contrast. COMPARISON:  CT scan of April 22, 2013. FINDINGS: Bony calvarium appears intact. Mild diffuse cortical atrophy is noted. Mild chronic ischemic white matter disease is noted. No mass effect or midline shift is noted. Ventricular size is within normal limits. There is no evidence of mass lesion, hemorrhage or acute infarction. IMPRESSION: Mild diffuse cortical atrophy. Mild chronic ischemic white matter disease. No acute intracranial abnormality seen. Electronically Signed   By: Marijo Conception, M.D.   On: 04/29/2016 18:07   I have personally reviewed and evaluated these images and lab results as part of my medical decision-making.   EKG Interpretation None      MDM   Final diagnoses:  Cervical strain, acute, initial encounter   82 year old male who presents with atraumatic left sided neck pain. Symptoms consistent with cervical strain. CT negative for acute pathology. Since patient is unable to take NSAIDs or Tylenol, will provide muscle relaxer. Patient is NAD, non-toxic, with stable VS.  Patient is informed of clinical course, understands medical decision making process, and agrees with plan. Opportunity for questions provided and all questions answered. Return precautions given.    Recardo Evangelist, PA-C 04/30/16 [place], MD 05/01/16 [phone]

## 2016-05-07 ENCOUNTER — Encounter (HOSPITAL_COMMUNITY): Payer: Self-pay | Admitting: Emergency Medicine

## 2016-05-07 ENCOUNTER — Emergency Department (HOSPITAL_COMMUNITY)
Admission: EM | Admit: 2016-05-07 | Discharge: 2016-05-07 | Disposition: A | Discharge status: Home / Self Care | Payer: Medicare Other | Attending: Emergency Medicine | Admitting: Emergency Medicine

## 2016-05-07 DIAGNOSIS — Z79899 Other long term (current) drug therapy: Secondary | ICD-10-CM | POA: Insufficient documentation

## 2016-05-07 DIAGNOSIS — T50901D Poisoning by unspecified drugs, medicaments and biological substances, accidental (unintentional), subsequent encounter: Secondary | ICD-10-CM | POA: Diagnosis not present

## 2016-05-07 DIAGNOSIS — E11649 Type 2 diabetes mellitus with hypoglycemia without coma: Secondary | ICD-10-CM | POA: Diagnosis present

## 2016-05-07 DIAGNOSIS — N189 Chronic kidney disease, unspecified: Secondary | ICD-10-CM | POA: Diagnosis not present

## 2016-05-07 DIAGNOSIS — M1990 Unspecified osteoarthritis, unspecified site: Secondary | ICD-10-CM | POA: Insufficient documentation

## 2016-05-07 DIAGNOSIS — Z794 Long term (current) use of insulin: Secondary | ICD-10-CM | POA: Insufficient documentation

## 2016-05-07 DIAGNOSIS — R109 Unspecified abdominal pain: Secondary | ICD-10-CM | POA: Diagnosis not present

## 2016-05-07 DIAGNOSIS — Z7982 Long term (current) use of aspirin: Secondary | ICD-10-CM | POA: Diagnosis not present

## 2016-05-07 DIAGNOSIS — I129 Hypertensive chronic kidney disease with stage 1 through stage 4 chronic kidney disease, or unspecified chronic kidney disease: Secondary | ICD-10-CM | POA: Insufficient documentation

## 2016-05-07 DIAGNOSIS — Z87891 Personal history of nicotine dependence: Secondary | ICD-10-CM | POA: Insufficient documentation

## 2016-05-07 LAB — CBG MONITORING, ED
GLUCOSE-CAPILLARY: 143 mg/dL — AB (ref 65–99)
Glucose-Capillary: 141 mg/dL — ABNORMAL HIGH (ref 65–99)
Glucose-Capillary: 203 mg/dL — ABNORMAL HIGH (ref 65–99)
Glucose-Capillary: 188 mg/dL — ABNORMAL HIGH (ref 65–99)

## 2016-05-07 LAB — I-STAT CHEM 8, ED
BUN: 26 mg/dL — AB (ref 6–20)
CALCIUM ION: 1.16 mmol/L (ref 1.13–1.30)
CREATININE: 1.5 mg/dL — AB (ref 0.61–1.24)
Chloride: 98 mmol/L — ABNORMAL LOW (ref 101–111)
Glucose, Bld: 223 mg/dL — ABNORMAL HIGH (ref 65–99)
HCT: 38 % — ABNORMAL LOW (ref 39.0–52.0)
Hemoglobin: 12.9 g/dL — ABNORMAL LOW (ref 13.0–17.0)
Potassium: 3.5 mmol/L (ref 3.5–5.1)
Sodium: 139 mmol/L (ref 135–145)
TCO2: 29 mmol/L (ref 0–100)

## 2016-05-07 NOTE — ED Notes (Signed)
Bed: [id number] Expected date:  Expected time:  Means of arrival:  Comments: EMS 82yo M took Novolog instead of Levimir

## 2016-05-07 NOTE — ED Notes (Signed)
Pt BIB EMS from home after accidentally injecting 26 units of novolog instead of 26 units of levimir at 2330; pt's CBG on EMS arrival was 120; pt has consumed ice cream, cantaloupe, juice, and cookies in the past 2 hours.

## 2016-05-07 NOTE — ED Provider Notes (Signed)
CSN: [id number]     Arrival date & time 05/07/16  0128 History  By signing my name below, I, [facility], attest that this documentation has been prepared under the direction and in the presence of Darcel Frane, MD. Electronically Signed: Virgel Bouquet, ED Scribe. 05/07/2016. 2:54 AM.   Chief Complaint  Patient presents with  . Blood Sugar Concern      Patient is a 82 y.o. male presenting with hypoglycemia. The history is provided by the patient. No language interpreter was used.  Hypoglycemia Initial blood sugar:  143 Severity:  Mild Onset quality:  Sudden Timing:  Constant Progression:  Resolved Chronicity:  New Diabetic status:  Controlled with insulin (took short acting insulin instead of long acting insulin and took long acting dose) Context: not exercise   Relieved by:  Eating Associated symptoms: no altered mental status, no seizures and no vomiting   HPI Comments: Ryan Jimenez is a 82 y.o. male with an hx of HTN, DM, and CKD who presents to the Emergency Department for a blood sugar concern. Pt states that he accidentally took his novolog instead of levimir earlier tonight after which he ate 2 cookies, ice cream, cantaloupe, watermelon, and banana and drank juice and milk with sugar added. He reports intermittent, mild abdominal cramping. Denies vomiting, diarrhea, or any other symptoms currently.   Past Medical History  Diagnosis Date  . Hypertension   . Diabetes mellitus   . Hypercholesterolemia   . Gout   . Shortness of breath on exertion   . Bronchitis   . Arthritis   . Near syncope 10/2011; 11/2011  . Renal disorder   . CKD (chronic kidney disease)   . Glomerulonephritis   . Pancytopenia   . Anemia    Past Surgical History  Procedure Laterality Date  . Hydrocele excision    . Cataract extraction w/ intraocular lens implant  ~ 2009    right   Family History  Problem Relation Age of Onset  . Hypertension Mother   . Heart disease Mother    . Diabetes Sister    Social History  Substance Use Topics  . Smoking status: Former Smoker -- 1.00 packs/day for 40 years    Types: Cigarettes  . Smokeless tobacco: Never Used  . Alcohol Use: No     Comment: 11/23/11 "couple ounces q month maybe"    Review of Systems  Gastrointestinal: Positive for abdominal pain. Negative for vomiting and diarrhea.  Neurological: Negative for seizures.  All other systems reviewed and are negative.     Allergies  Amlodipine and Clonidine derivatives  Home Medications   Prior to Admission medications   Medication Sig Start Date End Date Taking? Authorizing Provider  aspirin EC 81 MG tablet Take 81 mg by mouth daily.    Historical Provider, MD  carvedilol (COREG) 12.5 MG tablet Take 1 tablet (12.5 mg total) by mouth 2 (two) times daily with a meal. 05/02/13   Lavon Paganini Angiulli, PA-C  colchicine 0.6 MG tablet Take 0.6 mg by mouth daily.    Historical Provider, MD  insulin aspart (NOVOLOG) 100 UNIT/ML injection Inject 4-10 Units into the skin 3 (three) times daily as needed for high blood sugar. About 180 = 4 units, around 400 = 10 units    Historical Provider, MD  LEVEMIR FLEXTOUCH 100 UNIT/ML Pen Inject 25-30 Units into the skin at bedtime as needed (high blood sugar). Per sliding scale 01/30/15   Historical Provider, MD  losartan (COZAAR) 50 MG  tablet Take 50 mg by mouth 2 (two) times daily.    Historical Provider, MD  methocarbamol (ROBAXIN) 500 MG tablet Take 1 tablet (500 mg total) by mouth at bedtime as needed for muscle spasms. 04/29/16   Recardo Evangelist, PA-C  rosuvastatin (CRESTOR) 10 MG tablet Take 1 tablet (10 mg total) by mouth at bedtime. 05/02/13   Lavon Paganini Angiulli, PA-C  tamsulosin (FLOMAX) 0.4 MG CAPS Take 1 capsule (0.4 mg total) by mouth at bedtime. 05/02/13   Daniel J Angiulli, PA-C  Tetrahydroz-Dextran-PEG-Povid (EYE DROPS ADVANCED RELIEF OP) Place 1 drop into both eyes daily as needed (dry eyes).     Historical Provider, MD   Vitamin D, Ergocalciferol, (DRISDOL) 50000 UNITS CAPS Take 50,000 Units by mouth every 7 (seven) days. Take on sundays    Historical Provider, MD   BP 142/73 mmHg  Pulse 61  Temp(Src) 98.5 F (36.9 C) (Oral)  Resp 14  SpO2 96% Physical Exam  Constitutional: He is oriented to person, place, and time. He appears well-developed and well-nourished. No distress.  HENT:  Head: Normocephalic and atraumatic.  Mouth/Throat: Oropharynx is clear and moist and mucous membranes are normal. No oropharyngeal exudate.  Moist mucus membranes. No exudate.  Eyes: EOM are normal. Pupils are equal, round, and reactive to light.  PERRL.  Neck: Normal range of motion. Neck supple.  Cardiovascular: Normal rate, regular rhythm and intact distal pulses.   Regular rate or rhythm.  Pulmonary/Chest: Effort normal and breath sounds normal. No respiratory distress. He has no wheezes. He has no rales.  Lungs CTA bilaterally.  Abdominal: Soft. Bowel sounds are normal. He exhibits no distension. There is no rebound and no guarding.  Abdomen soft with no rebound or guarding. Little constipatied  Musculoskeletal: Normal range of motion.  Calves are soft.  Neurological: He is alert and oriented to person, place, and time. He has normal reflexes.  Skin: Skin is warm and dry. He is not diaphoretic.  Psychiatric: He has a normal mood and affect. His behavior is normal.  Nursing note and vitals reviewed.   ED Course  Procedures   DIAGNOSTIC STUDIES: Oxygen Saturation is 95% on RA, adequate by my interpretation.    COORDINATION OF CARE: 2:48 AM Will continue to check CBGs and monitor in ED. Discussed treatment plan with pt at bedside and pt agreed to plan.   Labs Review Labs Reviewed  CBG MONITORING, ED - Abnormal; Notable for the following:    Glucose-Capillary 141 (*)    All other components within normal limits  CBG MONITORING, ED - Abnormal; Notable for the following:    Glucose-Capillary 143 (*)    All  other components within normal limits    Imaging Review No results found. I have personally reviewed and evaluated these images and lab results as part of my medical decision-making.   EKG Interpretation None      MDM   Final diagnoses:  None   Filed Vitals:   05/07/16 0400 05/07/16 0430  BP: 146/76 154/89  Pulse: 55 58  Temp:    Resp:     Results for orders placed or performed during the hospital encounter of 05/07/16  CBG monitoring, ED  Result Value Ref Range   Glucose-Capillary 141 (H) 65 - 99 mg/dL   Comment 1 Notify RN   CBG monitoring, ED  Result Value Ref Range   Glucose-Capillary 143 (H) 65 - 99 mg/dL  POC CBG, ED  Result Value Ref Range   Glucose-Capillary 203 (  H) 65 - 99 mg/dL   Comment 1 Notify RN   I-Stat Chem 8, ED  Result Value Ref Range   Sodium 139 135 - 145 mmol/L   Potassium 3.5 3.5 - 5.1 mmol/L   Chloride 98 (L) 101 - 111 mmol/L   BUN 26 (H) 6 - 20 mg/dL   Creatinine, Ser 1.50 (H) 0.61 - 1.24 mg/dL   Glucose, Bld 223 (H) 65 - 99 mg/dL   Calcium, Ion 1.16 1.13 - 1.30 mmol/L   TCO2 29 0 - 100 mmol/L   Hemoglobin 12.9 (L) 13.0 - 17.0 g/dL   HCT 38.0 (L) 39.0 - 52.0 %  CBG monitoring, ED  Result Value Ref Range   Glucose-Capillary 188 (H) 65 - 99 mg/dL   Ct Head Wo Contrast  04/29/2016  CLINICAL DATA:  Headache. EXAM: CT HEAD WITHOUT CONTRAST TECHNIQUE: Contiguous axial images were obtained from the base of the skull through the vertex without intravenous contrast. COMPARISON:  CT scan of April 22, 2013. FINDINGS: Bony calvarium appears intact. Mild diffuse cortical atrophy is noted. Mild chronic ischemic white matter disease is noted. No mass effect or midline shift is noted. Ventricular size is within normal limits. There is no evidence of mass lesion, hemorrhage or acute infarction. IMPRESSION: Mild diffuse cortical atrophy. Mild chronic ischemic white matter disease. No acute intracranial abnormality seen. Electronically Signed   By: Marijo Conception, M.D.   On: 04/29/2016 18:07    Monitored for several hours and patient is asymptomatic and has maintained a sugar without any intervention and is stable for discharge at this time.  Strict return precautions given  I personally performed the services described in this documentation, which was scribed in my presence. The recorded information has been reviewed and is accurate.      Veatrice Kells, MD 05/07/16 [phone]

## 2016-05-18 ENCOUNTER — Encounter (HOSPITAL_COMMUNITY): Payer: Self-pay | Admitting: Emergency Medicine

## 2016-05-18 ENCOUNTER — Emergency Department (HOSPITAL_COMMUNITY)
Admission: EM | Admit: 2016-05-18 | Discharge: 2016-05-18 | Disposition: A | Discharge status: Home / Self Care | Payer: Medicare Other | Attending: Emergency Medicine | Admitting: Emergency Medicine

## 2016-05-18 ENCOUNTER — Emergency Department (HOSPITAL_COMMUNITY): Payer: Medicare Other

## 2016-05-18 DIAGNOSIS — Z7982 Long term (current) use of aspirin: Secondary | ICD-10-CM | POA: Insufficient documentation

## 2016-05-18 DIAGNOSIS — Z794 Long term (current) use of insulin: Secondary | ICD-10-CM | POA: Insufficient documentation

## 2016-05-18 DIAGNOSIS — I129 Hypertensive chronic kidney disease with stage 1 through stage 4 chronic kidney disease, or unspecified chronic kidney disease: Secondary | ICD-10-CM | POA: Diagnosis not present

## 2016-05-18 DIAGNOSIS — Z87891 Personal history of nicotine dependence: Secondary | ICD-10-CM | POA: Diagnosis not present

## 2016-05-18 DIAGNOSIS — E1122 Type 2 diabetes mellitus with diabetic chronic kidney disease: Secondary | ICD-10-CM | POA: Insufficient documentation

## 2016-05-18 DIAGNOSIS — R109 Unspecified abdominal pain: Secondary | ICD-10-CM | POA: Insufficient documentation

## 2016-05-18 DIAGNOSIS — Z79899 Other long term (current) drug therapy: Secondary | ICD-10-CM | POA: Diagnosis not present

## 2016-05-18 DIAGNOSIS — N189 Chronic kidney disease, unspecified: Secondary | ICD-10-CM | POA: Insufficient documentation

## 2016-05-18 LAB — CBC WITH DIFFERENTIAL/PLATELET
Basophils Absolute: 0 10*3/uL (ref 0.0–0.1)
Basophils Relative: 0 %
Eosinophils Absolute: 0.2 10*3/uL (ref 0.0–0.7)
Eosinophils Relative: 5 %
HCT: 40.9 % (ref 39.0–52.0)
Hemoglobin: 13.3 g/dL (ref 13.0–17.0)
Lymphocytes Relative: 33 %
Lymphs Abs: 1.5 10*3/uL (ref 0.7–4.0)
MCH: 26.3 pg (ref 26.0–34.0)
MCHC: 32.5 g/dL (ref 30.0–36.0)
MCV: 80.8 fL (ref 78.0–100.0)
Monocytes Absolute: 0.3 10*3/uL (ref 0.1–1.0)
Monocytes Relative: 6 %
Neutro Abs: 2.5 10*3/uL (ref 1.7–7.7)
Neutrophils Relative %: 56 %
Platelets: 137 10*3/uL — ABNORMAL LOW (ref 150–400)
RBC: 5.06 MIL/uL (ref 4.22–5.81)
RDW: 14.8 % (ref 11.5–15.5)
WBC: 4.4 10*3/uL (ref 4.0–10.5)

## 2016-05-18 LAB — COMPREHENSIVE METABOLIC PANEL
ALT: 22 U/L (ref 17–63)
AST: 23 U/L (ref 15–41)
Albumin: 3.6 g/dL (ref 3.5–5.0)
Alkaline Phosphatase: 64 U/L (ref 38–126)
Anion gap: 9 (ref 5–15)
BUN: 16 mg/dL (ref 6–20)
CO2: 25 mmol/L (ref 22–32)
Calcium: 8.9 mg/dL (ref 8.9–10.3)
Chloride: 101 mmol/L (ref 101–111)
Creatinine, Ser: 1.37 mg/dL — ABNORMAL HIGH (ref 0.61–1.24)
GFR calc Af Amer: 54 mL/min — ABNORMAL LOW (ref >60)
GFR calc non Af Amer: 47 mL/min — ABNORMAL LOW (ref >60)
Glucose, Bld: 337 mg/dL — ABNORMAL HIGH (ref 65–99)
Potassium: 3.4 mmol/L — ABNORMAL LOW (ref 3.5–5.1)
Sodium: 135 mmol/L (ref 135–145)
Total Bilirubin: 0.7 mg/dL (ref 0.3–1.2)
Total Protein: 7.4 g/dL (ref 6.5–8.1)

## 2016-05-18 LAB — URINALYSIS, ROUTINE W REFLEX MICROSCOPIC
BILIRUBIN URINE: NEGATIVE
Glucose, UA: >1000 mg/dL — AB
Ketones, ur: NEGATIVE mg/dL
NITRITE: NEGATIVE
PH: 6 (ref 5.0–8.0)
Protein, ur: 100 mg/dL — AB
SPECIFIC GRAVITY, URINE: 1.02 (ref 1.005–1.030)

## 2016-05-18 LAB — URINE MICROSCOPIC-ADD ON

## 2016-05-18 LAB — CBG MONITORING, ED
GLUCOSE-CAPILLARY: 301 mg/dL — AB (ref 65–99)
Glucose-Capillary: 255 mg/dL — ABNORMAL HIGH (ref 65–99)

## 2016-05-18 MED ORDER — HYDROCODONE-ACETAMINOPHEN 5-325 MG PO TABS: 1.0000 | ORAL_TABLET | Freq: Four times a day (QID) | ORAL | Status: DC | PRN

## 2016-05-18 MED ORDER — OXYCODONE-ACETAMINOPHEN 5-325 MG PO TABS
1.0000 | ORAL_TABLET | Freq: Once | ORAL | Status: AC
  Administered 2016-05-18: 1 via ORAL
  Filled 2016-05-18: qty 1

## 2016-05-18 MED ORDER — KETOROLAC TROMETHAMINE 30 MG/ML IJ SOLN: 15.0000 mg | Freq: Once | INTRAMUSCULAR | Status: DC

## 2016-05-18 MED ORDER — INSULIN ASPART 100 UNIT/ML ~~LOC~~ SOLN
4.0000 [IU] | Freq: Once | SUBCUTANEOUS | Status: AC
  Administered 2016-05-18: 4 [IU] via SUBCUTANEOUS
  Filled 2016-05-18: qty 1

## 2016-05-18 NOTE — ED Notes (Signed)
MD at bedside. [place]

## 2016-05-18 NOTE — ED Provider Notes (Signed)
CSN: [id number]     Arrival date & time 05/18/16  1123 History   First MD Initiated Contact with Patient 05/18/16 1140     Chief Complaint  Patient presents with  . Flank Pain     (Consider location/radiation/quality/duration/timing/severity/associated sxs/prior Treatment) HPI Patient presents to the emergency department with flank pain that started 2 days ago Patient states that he had put on a back brace very tight and then the next day the pain started in that area.  Patient states nothing seems make the condition better. He states that palpation of area makes the pain worse. The patient denies chest pain, shortness of breath, headache,blurred vision, neck pain, fever, cough, weakness, numbness, dizziness, anorexia, edema, nausea, vomiting, diarrhea, rash, back pain, dysuria, hematemesis, bloody stool, near syncope, or syncope. Past Medical History  Diagnosis Date  . Hypertension   . Diabetes mellitus   . Hypercholesterolemia   . Gout   . Shortness of breath on exertion   . Bronchitis   . Arthritis   . Near syncope 10/2011; 11/2011  . Renal disorder   . CKD (chronic kidney disease)   . Glomerulonephritis   . Pancytopenia   . Anemia    Past Surgical History  Procedure Laterality Date  . Hydrocele excision    . Cataract extraction w/ intraocular lens implant  ~ 2009    right   Family History  Problem Relation Age of Onset  . Hypertension Mother   . Heart disease Mother   . Diabetes Sister    Social History  Substance Use Topics  . Smoking status: Former Smoker -- 1.00 packs/day for 40 years    Types: Cigarettes  . Smokeless tobacco: Never Used  . Alcohol Use: No     Comment: 11/23/11 "couple ounces q month maybe"    Review of Systems All other systems negative except as documented in the HPI. All pertinent positives and negatives as reviewed in the HPI.   Allergies  Amlodipine and Clonidine derivatives  Home Medications   Prior to Admission medications    Medication Sig Start Date End Date Taking? Authorizing Provider  aspirin EC 81 MG tablet Take 81 mg by mouth daily.   Yes Historical Provider, MD  carvedilol (COREG) 12.5 MG tablet Take 1 tablet (12.5 mg total) by mouth 2 (two) times daily with a meal. 05/02/13  Yes Daniel J Angiulli, PA-C  colchicine 0.6 MG tablet Take 0.6 mg by mouth daily.   Yes Historical Provider, MD  insulin aspart (NOVOLOG) 100 UNIT/ML injection Inject 4-10 Units into the skin 3 (three) times daily as needed for high blood sugar. About 180 = 4 units, around 400 = 10 units   Yes Historical Provider, MD  LEVEMIR FLEXTOUCH 100 UNIT/ML Pen Inject 25 Units into the skin at bedtime as needed (high blood sugar). Per sliding scale 01/30/15  Yes Historical Provider, MD  losartan (COZAAR) 50 MG tablet Take 50 mg by mouth 2 (two) times daily.   Yes Historical Provider, MD  rosuvastatin (CRESTOR) 10 MG tablet Take 1 tablet (10 mg total) by mouth at bedtime. 05/02/13  Yes Daniel J Angiulli, PA-C  tamsulosin (FLOMAX) 0.4 MG CAPS Take 1 capsule (0.4 mg total) by mouth at bedtime. 05/02/13  Yes Daniel J Angiulli, PA-C  Tetrahydroz-Dextran-PEG-Povid (EYE DROPS ADVANCED RELIEF OP) Place 1 drop into both eyes daily as needed (dry eyes).    Yes Historical Provider, MD  Vitamin D, Ergocalciferol, (DRISDOL) 50000 UNITS CAPS Take 50,000 Units by mouth every 7 (  seven) days. Take on sundays   Yes Historical Provider, MD  HYDROcodone-acetaminophen (NORCO/VICODIN) 5-325 MG tablet Take 1 tablet by mouth every 6 (six) hours as needed for moderate pain. 05/18/16   Dalia Heading, PA-C  methocarbamol (ROBAXIN) 500 MG tablet Take 1 tablet (500 mg total) by mouth at bedtime as needed for muscle spasms. Patient not taking: Reported on 05/18/2016 04/29/16   Recardo Evangelist, PA-C   BP 155/77 mmHg  Pulse 52  Temp(Src) 97.6 F (36.4 C) (Oral)  Resp 17  SpO2 92% Physical Exam  Constitutional: He is oriented to person, place, and time. He appears well-developed  and well-nourished. No distress.  HENT:  Head: Normocephalic and atraumatic.  Mouth/Throat: Oropharynx is clear and moist.  Eyes: Pupils are equal, round, and reactive to light.  Neck: Normal range of motion. Neck supple.  Cardiovascular: Normal rate, regular rhythm and normal heart sounds.  Exam reveals no gallop and no friction rub.   No murmur heard. Pulmonary/Chest: Effort normal and breath sounds normal. No respiratory distress. He has no wheezes.  Abdominal: Soft. Bowel sounds are normal. He exhibits no distension. There is tenderness. There is no rebound and no guarding.  Neurological: He is alert and oriented to person, place, and time. He exhibits normal muscle tone. Coordination normal.  Skin: Skin is warm and dry. No rash noted. No erythema.  Psychiatric: He has a normal mood and affect. His behavior is normal.  Nursing note and vitals reviewed.   ED Course  Procedures (including critical care time) Labs Review Labs Reviewed  URINALYSIS, ROUTINE W REFLEX MICROSCOPIC (NOT AT [facility]) - Abnormal; Notable for the following:    Glucose, UA >1000 (*)    Hgb urine dipstick SMALL (*)    Protein, ur 100 (*)    Leukocytes, UA MODERATE (*)    All other components within normal limits  COMPREHENSIVE METABOLIC PANEL - Abnormal; Notable for the following:    Potassium 3.4 (*)    Glucose, Bld 337 (*)    Creatinine, Ser 1.37 (*)    GFR calc non Af Amer 47 (*)    GFR calc Af Amer 54 (*)    All other components within normal limits  CBC WITH DIFFERENTIAL/PLATELET - Abnormal; Notable for the following:    Platelets 137 (*)    All other components within normal limits  URINE MICROSCOPIC-ADD ON - Abnormal; Notable for the following:    Squamous Epithelial / LPF 0-5 (*)    Bacteria, UA FEW (*)    Casts HYALINE CASTS (*)    All other components within normal limits  CBG MONITORING, ED - Abnormal; Notable for the following:    Glucose-Capillary 301 (*)    All other components within  normal limits  CBG MONITORING, ED - Abnormal; Notable for the following:    Glucose-Capillary 255 (*)    All other components within normal limits    Imaging Review Ct Renal Stone Study  05/18/2016  CLINICAL DATA:  Right flank pain for 4 days EXAM: CT ABDOMEN AND PELVIS WITHOUT CONTRAST TECHNIQUE: Multidetector CT imaging of the abdomen and pelvis was performed following the standard protocol without IV contrast. COMPARISON:  10/14/2008 FINDINGS: Lower chest: Lung bases are clear. No effusions. Heart is normal size. Coronary artery calcifications noted in the visualized left anterior descending and right coronary arteries. Hepatobiliary: No focal hepatic abnormality. Gallbladder unremarkable. Pancreas: No focal abnormality or ductal dilatation. Spleen: No focal abnormality.  Normal size. Adrenals/Urinary Tract: No adrenal abnormality. No focal  renal abnormality. No stones or hydronephrosis. Urinary bladder is unremarkable. Stomach/Bowel: Descending colonic and sigmoid diverticulosis. No active diverticulitis. Stomach and small bowel are decompressed. Vascular/Lymphatic: Aortic and iliac calcifications diffusely. No aneurysm. No adenopathy. Reproductive: No visible focal abnormality. Other: No free fluid or free air. Musculoskeletal: No acute bony abnormality. Degenerative changes throughout the lumbar spine. IMPRESSION: No renal or ureteral stones.  No hydronephrosis. No acute findings in the abdomen or pelvis. Left colonic diverticulosis. Coronary artery disease.  Aortic atherosclerosis. Electronically Signed   By: Rolm Baptise M.D.   On: 05/18/2016 13:03   I have personally reviewed and evaluated these images and lab results as part of my medical decision-making.  Patient will be treated for muscular irritation strain of the abdominal obliques.  The patient is advised return here as needed.  He agrees the plan and all questions were answered.  Told to follow-up with his primary care  doctor   Dalia Heading, PA-C 05/22/16 2121  Merrily Pew, MD 05/22/16 [phone]

## 2016-05-18 NOTE — ED Provider Notes (Signed)
Medical screening examination/treatment/procedure(s) were conducted as a shared visit with non-physician practitioner(s) and myself.  I personally evaluated the patient during the encounter.  82 yo M w/ flank pain on right side. Worse with movement. Better with rest. TTP over oblique musculature. Pain when sitting up in bed. Abdomen benign. No midline back ttp.  Suspect muscular cause for symptoms, however 2/2 age will eval for kidney stone, liver/gb pathology. No rash to suggest zoster.   Merrily Pew, MD 05/18/16 [phone]

## 2016-05-18 NOTE — Discharge Instructions (Signed)
Return here as needed. Follow up with your doctor. °

## 2016-05-18 NOTE — ED Notes (Signed)
Patient complains of right sided flank pain x 4 days. Pain worse with movement. Denies urinary symptoms

## 2016-05-18 NOTE — ED Notes (Signed)
Family at bedside. 

## 2016-07-13 ENCOUNTER — Other Ambulatory Visit: Payer: Self-pay

## 2016-07-13 ENCOUNTER — Encounter: Payer: Self-pay | Admitting: Podiatry

## 2016-07-13 ENCOUNTER — Ambulatory Visit (INDEPENDENT_AMBULATORY_CARE_PROVIDER_SITE_OTHER): Payer: Medicare Other | Admitting: Podiatry

## 2016-07-13 DIAGNOSIS — M79676 Pain in unspecified toe(s): Secondary | ICD-10-CM

## 2016-07-13 DIAGNOSIS — B351 Tinea unguium: Secondary | ICD-10-CM

## 2016-07-13 DIAGNOSIS — E1149 Type 2 diabetes mellitus with other diabetic neurological complication: Secondary | ICD-10-CM

## 2016-07-13 NOTE — Patient Instructions (Signed)
Diabetes and Foot Care Diabetes may cause you to have problems because of poor blood supply (circulation) to your feet and legs. This may cause the skin on your feet to become thinner, break easier, and heal more slowly. Your skin may become dry, and the skin may peel and crack. You may also have nerve damage in your legs and feet causing decreased feeling in them. You may not notice minor injuries to your feet that could lead to infections or more serious problems. Taking care of your feet is one of the most important things you can do for yourself.  HOME CARE INSTRUCTIONS  Wear shoes at all times, even in the house. Do not go barefoot. Bare feet are easily injured.  Check your feet daily for blisters, cuts, and redness. If you cannot see the bottom of your feet, use a mirror or ask someone for help.  Wash your feet with warm water (do not use hot water) and mild soap. Then pat your feet and the areas between your toes until they are completely dry. Do not soak your feet as this can dry your skin.  Apply a moisturizing lotion or petroleum jelly (that does not contain alcohol and is unscented) to the skin on your feet and to dry, brittle toenails. Do not apply lotion between your toes.  Trim your toenails straight across. Do not dig under them or around the cuticle. File the edges of your nails with an emery board or nail file.  Do not cut corns or calluses or try to remove them with medicine.  Wear clean socks or stockings every day. Make sure they are not too tight. Do not wear knee-high stockings since they may decrease blood flow to your legs.  Wear shoes that fit properly and have enough cushioning. To break in new shoes, wear them for just a few hours a day. This prevents you from injuring your feet. Always look in your shoes before you put them on to be sure there are no objects inside.  Do not cross your legs. This may decrease the blood flow to your feet.  If you find a minor scrape,  cut, or break in the skin on your feet, keep it and the skin around it clean and dry. These areas may be cleansed with mild soap and water. Do not cleanse the area with peroxide, alcohol, or iodine.  When you remove an adhesive bandage, be sure not to damage the skin around it.  If you have a wound, look at it several times a day to make sure it is healing.  Do not use heating pads or hot water bottles. They may burn your skin. If you have lost feeling in your feet or legs, you may not know it is happening until it is too late.  Make sure your health care provider performs a complete foot exam at least annually or more often if you have foot problems. Report any cuts, sores, or bruises to your health care provider immediately. SEEK MEDICAL CARE IF:   You have an injury that is not healing.  You have cuts or breaks in the skin.  You have an ingrown nail.  You notice redness on your legs or feet.  You feel burning or tingling in your legs or feet.  You have pain or cramps in your legs and feet.  Your legs or feet are numb.  Your feet always feel cold. SEEK IMMEDIATE MEDICAL CARE IF:   There is increasing redness,   swelling, or pain in or around a wound.  There is a red line that goes up your leg.  Pus is coming from a wound.  You develop a fever or as directed by your health care provider.  You notice a bad smell coming from an ulcer or wound.   This information is not intended to replace advice given to you by your health care provider. Make sure you discuss any questions you have with your health care provider.   Document Released: 10/28/2000 Document Revised: 07/03/2013 Document Reviewed: 04/09/2013 Elsevier Interactive Patient Education 2016 [employer]  

## 2016-07-14 NOTE — Progress Notes (Signed)
Patient ID: Ryan Jimenez, male   DOB: Oct 22, 1932, 82 y.o.   MRN: [id number]   Subjective: This patient presents today complaining of thickened and elongated toenails from cough walking wearing shoes and requests toenail debridement. Last visit for a similar service was on 05/27/2015. Patient has been living in [place] and describes some podiatric care while he was living in [place] and plans to return to [place] shortly  Objective: Orientated 3 DP and PT pulses 2/4 bilaterally Capillary reflex within normal limits Sensation to 10 g monofilament wire intact 3/5 bilaterally Vibratory sensation nonreactive bilaterally Ankle reflex equal and reactive bilaterally No open skin lesions bilaterally Oval-shaped itching area lateral right ankle without inflammatory base The toenails are hypertrophic, elongated, deformed 6-10  Assessment: Diabetic with peripheral neuropathy Satisfactory vascular status Symptomatic onychomycoses 6-10 Skin lesionslateral ankle  Plan: Advised patient to consult dermatologist for skin lesion lateral right ankle Toenails 6-10 were debrided mechanically and electric without any bleeding  Patient returning to [place] and does not request follow-up visit

## 2016-07-26 ENCOUNTER — Ambulatory Visit (HOSPITAL_COMMUNITY)
Admission: EM | Admit: 2016-07-26 | Discharge: 2016-07-26 | Disposition: A | Discharge status: Home / Self Care | Payer: Medicare Other | Attending: Family Medicine | Admitting: Family Medicine

## 2016-07-26 DIAGNOSIS — H6092 Unspecified otitis externa, left ear: Secondary | ICD-10-CM

## 2016-07-26 MED ORDER — AMOXICILLIN 500 MG PO CAPS: 1000.0000 mg | ORAL_CAPSULE | Freq: Two times a day (BID) | ORAL | 0 refills | Status: DC

## 2016-07-26 MED ORDER — NEOMYCIN-POLYMYXIN-HC 3.5-10000-1 OT SUSP: 4.0000 [drp] | Freq: Three times a day (TID) | OTIC | 0 refills | Status: DC

## 2016-07-26 NOTE — ED Triage Notes (Signed)
C/o left ear pain for two days Denies any swimming States he used peroxide inside ear

## 2016-07-26 NOTE — ED Provider Notes (Signed)
CSN: [id number]     Arrival date & time 07/26/16  1147 History   First MD Initiated Contact with Patient 07/26/16 1306     Chief Complaint  Patient presents with  . Otalgia   (Consider location/radiation/quality/duration/timing/severity/associated sxs/prior Treatment) 82 year old male complaining of left earache for 2 days. He states that he is placed hydrogen peroxide in his ear. He is also been cleaning his ear with an ear swab, Q-tip. Denies bleeding or other drainage coming from the ear. Denies problems with hearing.      Past Medical History:  Diagnosis Date  . Anemia   . Arthritis   . Bronchitis   . CKD (chronic kidney disease)   . Diabetes mellitus   . Glomerulonephritis   . Gout   . Hypercholesterolemia   . Hypertension   . Near syncope 10/2011; 11/2011  . Pancytopenia   . Renal disorder   . Shortness of breath on exertion    Past Surgical History:  Procedure Laterality Date  . CATARACT EXTRACTION W/ INTRAOCULAR LENS IMPLANT  ~ 2009   right  . HYDROCELE EXCISION     Family History  Problem Relation Age of Onset  . Hypertension Mother   . Heart disease Mother   . Diabetes Sister    Social History  Substance Use Topics  . Smoking status: Former Smoker    Packs/day: 1.00    Years: 40.00    Types: Cigarettes  . Smokeless tobacco: Never Used  . Alcohol use No     Comment: 11/23/11 "couple ounces q month maybe"    Review of Systems  Constitutional: Negative.   HENT: Positive for ear pain. Negative for congestion and rhinorrhea.   Respiratory: Negative.   Gastrointestinal: Negative.   Skin: Negative.   Neurological: Negative.   All other systems reviewed and are negative.   Allergies  Amlodipine and Clonidine derivatives  Home Medications   Prior to Admission medications   Medication Sig Start Date End Date Taking? Authorizing Provider  amoxicillin (AMOXIL) 500 MG capsule Take 2 capsules (1,000 mg total) by mouth 2 (two) times daily. 07/26/16   Janne Napoleon, NP  aspirin EC 81 MG tablet Take 81 mg by mouth daily.    Historical Provider, MD  carvedilol (COREG) 12.5 MG tablet Take 1 tablet (12.5 mg total) by mouth 2 (two) times daily with a meal. 05/02/13   Lavon Paganini Angiulli, PA-C  colchicine 0.6 MG tablet Take 0.6 mg by mouth daily.    Historical Provider, MD  HYDROcodone-acetaminophen (NORCO/VICODIN) 5-325 MG tablet Take 1 tablet by mouth every 6 (six) hours as needed for moderate pain. 05/18/16   Dalia Heading, PA-C  insulin aspart (NOVOLOG) 100 UNIT/ML injection Inject 4-10 Units into the skin 3 (three) times daily as needed for high blood sugar. About 180 = 4 units, around 400 = 10 units    Historical Provider, MD  LEVEMIR FLEXTOUCH 100 UNIT/ML Pen Inject 25 Units into the skin at bedtime as needed (high blood sugar). Per sliding scale 01/30/15   Historical Provider, MD  losartan (COZAAR) 50 MG tablet Take 50 mg by mouth 2 (two) times daily.    Historical Provider, MD  methocarbamol (ROBAXIN) 500 MG tablet Take 1 tablet (500 mg total) by mouth at bedtime as needed for muscle spasms. Patient not taking: Reported on 05/18/2016 04/29/16   Recardo Evangelist, PA-C  neomycin-polymyxin-hydrocortisone (CORTISPORIN) 3.5-10000-1 otic suspension Place 4 drops into the left ear 3 (three) times daily. 07/26/16   Janne Napoleon,  NP  rosuvastatin (CRESTOR) 10 MG tablet Take 1 tablet (10 mg total) by mouth at bedtime. 05/02/13   Lavon Paganini Angiulli, PA-C  tamsulosin (FLOMAX) 0.4 MG CAPS Take 1 capsule (0.4 mg total) by mouth at bedtime. 05/02/13   Daniel J Angiulli, PA-C  Tetrahydroz-Dextran-PEG-Povid (EYE DROPS ADVANCED RELIEF OP) Place 1 drop into both eyes daily as needed (dry eyes).     Historical Provider, MD  Vitamin D, Ergocalciferol, (DRISDOL) 50000 UNITS CAPS Take 50,000 Units by mouth every 7 (seven) days. Take on sundays    Historical Provider, MD   Meds Ordered and Administered this Visit  Medications - No data to display  BP 156/75 (BP Location: Left Arm)    Pulse 61   Temp 98.3 F (36.8 C) (Oral)   Resp 16   SpO2 99%  No data found.   Physical Exam  Constitutional: He is oriented to person, place, and time. He appears well-developed and well-nourished. No distress.  HENT:  Head: Normocephalic and atraumatic.  Right Ear: External ear normal.  Left Ear: External ear normal.  Right TM and EAC is normal. Left TM with bulging and injection to the muscle. Her aspect. There is also a patch of bleeding to wall of the EAC at the tendon 11:00 position. No other trauma is seen. The TM appears to be intact. No fluid or bleeding appears to be exuding from the TM. No foreign bodies.  Eyes: EOM are normal.  Neck: Normal range of motion. Neck supple.  Cardiovascular: Normal rate.   Pulmonary/Chest: Effort normal.  Lymphadenopathy:    He has no cervical adenopathy.  Neurological: He is alert and oriented to person, place, and time.  Skin: Skin is warm and dry.  Psychiatric: He has a normal mood and affect.  Nursing note and vitals reviewed.   Urgent Care Course   Clinical Course    Procedures (including critical care time)  Labs Review Labs Reviewed - No data to display  Imaging Review No results found.   Visual Acuity Review  Right Eye Distance:   Left Eye Distance:   Bilateral Distance:    Right Eye Near:   Left Eye Near:    Bilateral Near:         MDM   1. Otitis externa, left    Follow-up your primary care doctor if needed. Use the eardrops as directed and take the medication as directed. If she continued to have pain and discomfort follow-up with your primary care. May return as needed. Meds ordered this encounter  Medications  . neomycin-polymyxin-hydrocortisone (CORTISPORIN) 3.5-10000-1 otic suspension    Sig: Place 4 drops into the left ear 3 (three) times daily.    Dispense:  10 mL    Refill:  0    Order Specific Question:   Supervising Provider    Answer:   Billy Fischer [phone]  . amoxicillin (AMOXIL) 500  MG capsule    Sig: Take 2 capsules (1,000 mg total) by mouth 2 (two) times daily.    Dispense:  32 capsule    Refill:  0    Order Specific Question:   Supervising Provider    Answer:   Billy Fischer [[id number]]   Prior to coming to the urgent care the patient called his PCP. PCP called in some medication for his ear. He did not pick it up. It question as to whether he should take the medicine prescribed here or by his PCP. Since we do not know what those  medications prescribed by his PCP R he is instructed to take once prescribed here.    Janne Napoleon, NP 07/26/16 1320

## 2016-07-26 NOTE — Discharge Instructions (Signed)
Follow-up your primary care doctor if needed. Use the eardrops as directed and take the medication as directed. If she continued to have pain and discomfort follow-up with your primary care. May return as needed.

## 2017-07-03 ENCOUNTER — Other Ambulatory Visit: Payer: Self-pay | Admitting: Nephrology

## 2017-07-03 DIAGNOSIS — N183 Chronic kidney disease, stage 3 unspecified: Secondary | ICD-10-CM

## 2017-07-07 ENCOUNTER — Ambulatory Visit
Admission: RE | Admit: 2017-07-07 | Discharge: 2017-07-07 | Disposition: A | Discharge status: Home / Self Care | Payer: Medicare Other | Source: Ambulatory Visit | Attending: Nephrology | Admitting: Nephrology

## 2017-07-07 DIAGNOSIS — N183 Chronic kidney disease, stage 3 unspecified: Secondary | ICD-10-CM

## 2018-05-29 ENCOUNTER — Ambulatory Visit (INDEPENDENT_AMBULATORY_CARE_PROVIDER_SITE_OTHER): Payer: Medicare Other

## 2018-05-29 ENCOUNTER — Encounter (HOSPITAL_COMMUNITY): Payer: Self-pay | Admitting: Emergency Medicine

## 2018-05-29 ENCOUNTER — Ambulatory Visit (HOSPITAL_COMMUNITY)
Admission: EM | Admit: 2018-05-29 | Discharge: 2018-05-29 | Disposition: A | Discharge status: Home / Self Care | Payer: Medicare Other | Attending: Family Medicine | Admitting: Family Medicine

## 2018-05-29 ENCOUNTER — Other Ambulatory Visit: Payer: Self-pay

## 2018-05-29 DIAGNOSIS — J40 Bronchitis, not specified as acute or chronic: Secondary | ICD-10-CM

## 2018-05-29 MED ORDER — PREDNISONE 20 MG PO TABS: 40.0000 mg | ORAL_TABLET | Freq: Every day | ORAL | 0 refills | Status: AC

## 2018-05-29 MED ORDER — FLUTICASONE PROPIONATE 50 MCG/ACT NA SUSP: 2.0000 | Freq: Every day | NASAL | 0 refills | Status: DC

## 2018-05-29 MED ORDER — CETIRIZINE HCL 5 MG PO TABS: 5.0000 mg | ORAL_TABLET | Freq: Every day | ORAL | 0 refills | Status: DC

## 2018-05-29 MED ORDER — IPRATROPIUM BROMIDE 0.06 % NA SOLN: 2.0000 | Freq: Four times a day (QID) | NASAL | 0 refills | Status: DC

## 2018-05-29 NOTE — ED Triage Notes (Signed)
The patient presented to the [facility] with a complaint of a cough and sneezing x 3 days.

## 2018-05-29 NOTE — Discharge Instructions (Addendum)
Your xray was negative for pneumonia today. Start flonase, atrovent nasal spray, zyrtec for nasal congestion/drainage. You can use over the counter nasal saline rinse such as neti pot for nasal congestion. Keep hydrated, your urine should be clear to pale yellow in color. Tylenol/motrin for fever and pain. Monitor for any worsening of symptoms, chest pain, shortness of breath, wheezing, swelling of the throat, follow up for reevaluation.   If you continue to have worsening cough, shortness of breath, wheezing, you can fill prednisone to help with bronchitis symptoms. As discussed, this will raise your blood sugar while you are on it.   For sore throat/cough try using a honey-based tea. Use 3 teaspoons of honey with juice squeezed from half lemon. Place shaved pieces of ginger into 1/2-1 cup of water and warm over stove top. Then mix the ingredients and repeat every 4 hours as needed.

## 2018-05-29 NOTE — ED Provider Notes (Signed)
[place]    CSN: [id number] Arrival date & time: 05/29/18  1022     History   Chief Complaint Chief Complaint  Patient presents with  . Cough    HPI Ryan Jimenez is a 84 y.o. male.   84 year old male with history of CKD, DM, HLD, HTN, CVA comes in for 3-day history of coughing and sneezing.  States has also had some rhinorrhea, nasal congestion.  Cough is nonproductive.  Mild sore throat.  He denies fever, chills, night sweats.  Has had some shortness of breath and wheezing during cough that is much improved with vic.  Has not taken anything else for the symptoms.  Former smoker.  No sick contact.  Patient states traveling to [place] tomorrow, and would like to make sure it is not pneumonia.     Past Medical History:  Diagnosis Date  . Anemia   . Arthritis   . Bronchitis   . CKD (chronic kidney disease)   . Diabetes mellitus   . Glomerulonephritis   . Gout   . Hypercholesterolemia   . Hypertension   . Near syncope 10/2011; 11/2011  . Pancytopenia   . Renal disorder   . Shortness of breath on exertion     Patient Active Problem List   Diagnosis Date Noted  . CKD (chronic kidney disease) stage 3, GFR 30-59 ml/min (HCC) 04/04/2015  . Chest pain 04/03/2015  . 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 CVA (cerebral infarction) 04/23/2013  . Hidradenitis suppurativa of right axilla 04/05/2012  . Abscess of axillary region 04/04/2012  . Neutropenia 04/04/2012  . Pancytopenia due to chemotherapy ([place]) 04/04/2012  . Anemia 04/04/2012  . Generalized weakness 04/04/2012  . Physical deconditioning 04/04/2012  . Benign positional vertigo 11/28/2011  . Hypertension 11/23/2011  . Renal failure 11/23/2011  . Type 2 diabetes mellitus ([place]) 11/23/2011  . Hypercholesterolemia     Past Surgical History:  Procedure Laterality Date  . CATARACT EXTRACTION W/ INTRAOCULAR LENS IMPLANT  ~ 2009   right  . HYDROCELE EXCISION         Home  Medications    Prior to Admission medications   Medication Sig Start Date End Date Taking? Authorizing Provider  aspirin EC 81 MG tablet Take 81 mg by mouth daily.    [provider]  carvedilol (COREG) 12.5 MG tablet Take 1 tablet (12.5 mg total) by mouth 2 (two) times daily with a meal. 05/02/13   Angiulli, Lavon Paganini, PA-C  cetirizine (ZYRTEC) 5 MG tablet Take 1 tablet (5 mg total) by mouth daily. 05/29/18   Tasia Catchings, Madhavi Hamblen V, PA-C  colchicine 0.6 MG tablet Take 0.6 mg by mouth daily.    [provider]  fluticasone (FLONASE) 50 MCG/ACT nasal spray Place 2 sprays into both nostrils daily. 05/29/18   Tasia Catchings, Delenn Ahn V, PA-C  HYDROcodone-acetaminophen (NORCO/VICODIN) 5-325 MG tablet Take 1 tablet by mouth every 6 (six) hours as needed for moderate pain. 05/18/16   Lawyer, Harrell Gave, PA-C  insulin aspart (NOVOLOG) 100 UNIT/ML injection Inject 4-10 Units into the skin 3 (three) times daily as needed for high blood sugar. About 180 = 4 units, around 400 = 10 units    [provider]  ipratropium (ATROVENT) 0.06 % nasal spray Place 2 sprays into both nostrils 4 (four) times daily. 05/29/18   Tasia Catchings, Nivaan Dicenzo V, PA-C  LEVEMIR FLEXTOUCH 100 UNIT/ML Pen Inject 25 Units into the skin at bedtime as needed (high blood sugar). Per sliding scale 01/30/15  [provider]  losartan (COZAAR) 50 MG tablet Take 50 mg by mouth 2 (two) times daily.    [provider]  neomycin-polymyxin-hydrocortisone (CORTISPORIN) 3.5-10000-1 otic suspension Place 4 drops into the left ear 3 (three) times daily. 07/26/16   Janne Napoleon, NP  predniSONE (DELTASONE) 20 MG tablet Take 2 tablets (40 mg total) by mouth daily for 5 days. 05/29/18 06/03/18  Tasia Catchings, Sianne Tejada V, PA-C  rosuvastatin (CRESTOR) 10 MG tablet Take 1 tablet (10 mg total) by mouth at bedtime. 05/02/13   Angiulli, Lavon Paganini, PA-C  tamsulosin (FLOMAX) 0.4 MG CAPS Take 1 capsule (0.4 mg total) by mouth at bedtime. 05/02/13   Angiulli, Lavon Paganini, PA-C    Tetrahydroz-Dextran-PEG-Povid (EYE DROPS ADVANCED RELIEF OP) Place 1 drop into both eyes daily as needed (dry eyes).     [provider]  Vitamin D, Ergocalciferol, (DRISDOL) 50000 UNITS CAPS Take 50,000 Units by mouth every 7 (seven) days. Take on sundays    [provider]    Family History Family History  Problem Relation Age of Onset  . Hypertension Mother   . Heart disease Mother   . Diabetes Sister     Social History Social History   Tobacco Use  . Smoking status: Former Smoker    Packs/day: 1.00    Years: 40.00    Pack years: 40.00    Types: Cigarettes  . Smokeless tobacco: Never Used  Substance Use Topics  . Alcohol use: No    Comment: 11/23/11 "couple ounces q month maybe"  . Drug use: No     Allergies   Amlodipine and Clonidine derivatives   Review of Systems Review of Systems  Reason unable to perform ROS: See HPI as above.     Physical Exam Triage Vital Signs ED Triage Vitals  Enc Vitals Group     BP 05/29/18 1038 (!) 148/84     Pulse Rate 05/29/18 1038 84     Resp 05/29/18 1038 18     Temp 05/29/18 1038 99.2 F (37.3 C)     Temp Source 05/29/18 1038 Oral     SpO2 05/29/18 1038 95 %     Weight --      Height --      Head Circumference --      Peak Flow --      Pain Score 05/29/18 1040 0     Pain Loc --      Pain Edu? --      Excl. in [place]? --    No data found.  Updated Vital Signs BP (!) 148/84 (BP Location: Right Arm)   Pulse 84   Temp 99.2 F (37.3 C) (Oral)   Resp 18   SpO2 95%   Physical Exam  Constitutional: He is oriented to person, place, and time. He appears well-developed and well-nourished. No distress.  HENT:  Head: Normocephalic and atraumatic.  Right Ear: Tympanic membrane, external ear and ear canal normal. Tympanic membrane is not erythematous and not bulging.  Left Ear: Tympanic membrane, external ear and ear canal normal. Tympanic membrane is not erythematous and not bulging.  Nose: Rhinorrhea  present. Right sinus exhibits no maxillary sinus tenderness and no frontal sinus tenderness. Left sinus exhibits no maxillary sinus tenderness and no frontal sinus tenderness.  Mouth/Throat: Uvula is midline, oropharynx is clear and moist and mucous membranes are normal.  Eyes: Pupils are equal, round, and reactive to light. Conjunctivae are normal.  Neck: Normal range of motion. Neck supple.  Cardiovascular: Normal rate, regular rhythm and normal heart sounds. Exam reveals no gallop and no friction rub.  No murmur heard. Pulmonary/Chest: Effort normal and breath sounds normal. He has no decreased breath sounds. He has no wheezes. He has no rhonchi. He has no rales.  Lymphadenopathy:    He has no cervical adenopathy.  Neurological: He is alert and oriented to person, place, and time.  Skin: Skin is warm and dry.  Psychiatric: He has a normal mood and affect. His behavior is normal. Judgment normal.     UC Treatments / Results  Labs (all labs ordered are listed, but only abnormal results are displayed) Labs Reviewed - No data to display  EKG None  Radiology Dg Chest 2 View  Result Date: 05/29/2018 CLINICAL DATA:  Cough, shortness of breath, low-grade fever EXAM: CHEST - 2 VIEW COMPARISON:  04/03/2015 FINDINGS: Heart and mediastinal contours are within normal limits. No focal opacities or effusions. No acute bony abnormality. IMPRESSION: No active cardiopulmonary disease. Electronically Signed   By: Rolm Baptise M.D.   On: 05/29/2018 11:05    Procedures Procedures (including critical care time)  Medications Ordered in UC Medications - No data to display  Initial Impression / Assessment and Plan / UC Course  I have reviewed the triage vital signs and the nursing notes.  Pertinent labs & imaging results that were available during my care of the patient were reviewed by me and considered in my medical decision making (see chart for details).    Discussed with patient given  afebrile, lungs clear to auscultation bilaterally without adventitious lung sounds, no suspicion for pneumonia.  With patient's history of 40-pack-year history, more concerning for bronchitis.  Patient would like to proceed with chest x-ray given traveling out of state tomorrow.  Risks and benefits discussed.  Chest x-ray negative for pneumonia.  Will provide symptomatic treatment, Rx of prednisone provided.  Patient to fill for bronchitis if symptoms not improving.  Discussed with patient prednisone can cause elevation of blood glucose while on medication.  Patient expresses understanding, and will monitor blood sugar.  Return precautions given.  Patient expresses understanding and agrees to plan.  Final Clinical Impressions(s) / UC Diagnoses   Final diagnoses:  Bronchitis    ED Prescriptions    Medication Sig Dispense Auth. Provider   cetirizine (ZYRTEC) 5 MG tablet Take 1 tablet (5 mg total) by mouth daily. 10 tablet Rynell Ciotti V, PA-C   fluticasone (FLONASE) 50 MCG/ACT nasal spray Place 2 sprays into both nostrils daily. 1 g Yoselin Amerman V, PA-C   ipratropium (ATROVENT) 0.06 % nasal spray Place 2 sprays into both nostrils 4 (four) times daily. 15 mL Angalina Ante V, PA-C   predniSONE (DELTASONE) 20 MG tablet Take 2 tablets (40 mg total) by mouth daily for 5 days. 10 tablet Tobin Chad, PA-C 05/29/18 1126

## 2019-04-12 ENCOUNTER — Other Ambulatory Visit: Payer: Self-pay | Admitting: Nephrology

## 2019-04-12 DIAGNOSIS — N183 Chronic kidney disease, stage 3 unspecified: Secondary | ICD-10-CM

## 2019-04-15 ENCOUNTER — Ambulatory Visit
Admission: RE | Admit: 2019-04-15 | Discharge: 2019-04-15 | Disposition: A | Discharge status: Home / Self Care | Payer: Medicare Other | Source: Ambulatory Visit | Attending: Nephrology | Admitting: Nephrology

## 2019-04-15 DIAGNOSIS — N183 Chronic kidney disease, stage 3 unspecified: Secondary | ICD-10-CM

## 2019-06-07 ENCOUNTER — Other Ambulatory Visit (HOSPITAL_COMMUNITY): Payer: Self-pay | Admitting: Nephrology

## 2019-06-07 ENCOUNTER — Other Ambulatory Visit: Payer: Self-pay | Admitting: Nephrology

## 2019-06-07 DIAGNOSIS — N182 Chronic kidney disease, stage 2 (mild): Secondary | ICD-10-CM

## 2019-06-14 ENCOUNTER — Other Ambulatory Visit: Payer: Self-pay | Admitting: Student

## 2019-06-17 ENCOUNTER — Other Ambulatory Visit: Payer: Self-pay

## 2019-06-17 ENCOUNTER — Ambulatory Visit (HOSPITAL_COMMUNITY)
Admission: RE | Admit: 2019-06-17 | Discharge: 2019-06-17 | Disposition: A | Discharge status: Home / Self Care | Payer: Medicare Other | Source: Ambulatory Visit | Attending: Nephrology | Admitting: Nephrology

## 2019-06-17 ENCOUNTER — Emergency Department (HOSPITAL_COMMUNITY)
Admission: EM | Admit: 2019-06-17 | Discharge: 2019-06-17 | Disposition: A | Discharge status: Home / Self Care | Payer: Medicare Other | Attending: Emergency Medicine | Admitting: Emergency Medicine

## 2019-06-17 ENCOUNTER — Encounter (HOSPITAL_COMMUNITY): Payer: Self-pay

## 2019-06-17 DIAGNOSIS — E1122 Type 2 diabetes mellitus with diabetic chronic kidney disease: Secondary | ICD-10-CM | POA: Insufficient documentation

## 2019-06-17 DIAGNOSIS — Z87891 Personal history of nicotine dependence: Secondary | ICD-10-CM | POA: Diagnosis not present

## 2019-06-17 DIAGNOSIS — N183 Chronic kidney disease, stage 3 (moderate): Secondary | ICD-10-CM | POA: Insufficient documentation

## 2019-06-17 DIAGNOSIS — N182 Chronic kidney disease, stage 2 (mild): Secondary | ICD-10-CM

## 2019-06-17 DIAGNOSIS — Z794 Long term (current) use of insulin: Secondary | ICD-10-CM | POA: Insufficient documentation

## 2019-06-17 DIAGNOSIS — I129 Hypertensive chronic kidney disease with stage 1 through stage 4 chronic kidney disease, or unspecified chronic kidney disease: Secondary | ICD-10-CM | POA: Insufficient documentation

## 2019-06-17 DIAGNOSIS — Z7982 Long term (current) use of aspirin: Secondary | ICD-10-CM | POA: Diagnosis not present

## 2019-06-17 DIAGNOSIS — R339 Retention of urine, unspecified: Secondary | ICD-10-CM | POA: Insufficient documentation

## 2019-06-17 DIAGNOSIS — Z8673 Personal history of transient ischemic attack (TIA), and cerebral infarction without residual deficits: Secondary | ICD-10-CM | POA: Diagnosis not present

## 2019-06-17 DIAGNOSIS — Z79899 Other long term (current) drug therapy: Secondary | ICD-10-CM | POA: Insufficient documentation

## 2019-06-17 LAB — BASIC METABOLIC PANEL
Anion gap: 10 (ref 5–15)
Anion gap: 11 (ref 5–15)
BUN: 26 mg/dL — ABNORMAL HIGH (ref 8–23)
BUN: 29 mg/dL — ABNORMAL HIGH (ref 8–23)
CO2: 29 mmol/L (ref 22–32)
CO2: 26 mmol/L (ref 22–32)
Calcium: 9.1 mg/dL (ref 8.9–10.3)
Calcium: 9 mg/dL (ref 8.9–10.3)
Chloride: 100 mmol/L (ref 98–111)
Chloride: 94 mmol/L — ABNORMAL LOW (ref 98–111)
Creatinine, Ser: 2.59 mg/dL — ABNORMAL HIGH (ref 0.61–1.24)
Creatinine, Ser: 2.76 mg/dL — ABNORMAL HIGH (ref 0.61–1.24)
GFR calc Af Amer: 25 mL/min — ABNORMAL LOW (ref >60)
GFR calc Af Amer: 23 mL/min — ABNORMAL LOW (ref >60)
GFR calc non Af Amer: 22 mL/min — ABNORMAL LOW (ref >60)
GFR calc non Af Amer: 20 mL/min — ABNORMAL LOW (ref >60)
Glucose, Bld: 163 mg/dL — ABNORMAL HIGH (ref 70–99)
Glucose, Bld: 184 mg/dL — ABNORMAL HIGH (ref 70–99)
Potassium: 3.5 mmol/L (ref 3.5–5.1)
Potassium: 3.8 mmol/L (ref 3.5–5.1)
Sodium: 139 mmol/L (ref 135–145)
Sodium: 131 mmol/L — ABNORMAL LOW (ref 135–145)

## 2019-06-17 LAB — CBC WITH DIFFERENTIAL/PLATELET
Abs Immature Granulocytes: 0.04 10*3/uL (ref 0.00–0.07)
Basophils Absolute: 0.1 10*3/uL (ref 0.0–0.1)
Basophils Relative: 1 %
Eosinophils Absolute: 0.3 10*3/uL (ref 0.0–0.5)
Eosinophils Relative: 3 %
HCT: 41.2 % (ref 39.0–52.0)
Hemoglobin: 13.4 g/dL (ref 13.0–17.0)
Immature Granulocytes: 0 %
Lymphocytes Relative: 14 %
Lymphs Abs: 1.4 10*3/uL (ref 0.7–4.0)
MCH: 25.9 pg — ABNORMAL LOW (ref 26.0–34.0)
MCHC: 32.5 g/dL (ref 30.0–36.0)
MCV: 79.5 fL — ABNORMAL LOW (ref 80.0–100.0)
Monocytes Absolute: 0.7 10*3/uL (ref 0.1–1.0)
Monocytes Relative: 7 %
Neutro Abs: 7.7 10*3/uL (ref 1.7–7.7)
Neutrophils Relative %: 75 %
Platelets: 184 10*3/uL (ref 150–400)
RBC: 5.18 MIL/uL (ref 4.22–5.81)
RDW: 14.2 % (ref 11.5–15.5)
WBC: 10.2 10*3/uL (ref 4.0–10.5)
nRBC: 0 % (ref 0.0–0.2)

## 2019-06-17 LAB — PROTIME-INR
INR: 1 (ref 0.8–1.2)
Prothrombin Time: 13 seconds (ref 11.4–15.2)

## 2019-06-17 LAB — CBC
HCT: 40.9 % (ref 39.0–52.0)
Hemoglobin: 13 g/dL (ref 13.0–17.0)
MCH: 25.9 pg — ABNORMAL LOW (ref 26.0–34.0)
MCHC: 31.8 g/dL (ref 30.0–36.0)
MCV: 81.5 fL (ref 80.0–100.0)
Platelets: 204 10*3/uL (ref 150–400)
RBC: 5.02 MIL/uL (ref 4.22–5.81)
RDW: 14.6 % (ref 11.5–15.5)
WBC: 6.4 10*3/uL (ref 4.0–10.5)
nRBC: 0 % (ref 0.0–0.2)

## 2019-06-17 LAB — CBG MONITORING, ED: Glucose-Capillary: 189 mg/dL — ABNORMAL HIGH (ref 70–99)

## 2019-06-17 MED ORDER — ZOLPIDEM TARTRATE 5 MG PO TABS
2.5000 mg | ORAL_TABLET | Freq: Once | ORAL | Status: AC
  Administered 2019-06-17: 2.5 mg via ORAL
  Filled 2019-06-17: qty 1

## 2019-06-17 MED ORDER — GELATIN ABSORBABLE 12-7 MM EX MISC
CUTANEOUS | Status: AC
  Filled 2019-06-17: qty 1

## 2019-06-17 MED ORDER — ZOLPIDEM TARTRATE 5 MG PO TABS: 5.0000 mg | ORAL_TABLET | Freq: Once | ORAL | Status: DC

## 2019-06-17 MED ORDER — SODIUM CHLORIDE 0.9 % IV SOLN: INTRAVENOUS | Status: DC

## 2019-06-17 MED ORDER — MIDAZOLAM HCL 2 MG/2ML IJ SOLN
INTRAMUSCULAR | Status: AC | PRN
  Administered 2019-06-17: 1 mg via INTRAVENOUS
  Administered 2019-06-17: 0.5 mg via INTRAVENOUS

## 2019-06-17 MED ORDER — TRAMADOL HCL 50 MG PO TABS
50.0000 mg | ORAL_TABLET | Freq: Once | ORAL | Status: AC
  Administered 2019-06-17: 50 mg via ORAL
  Filled 2019-06-17: qty 1

## 2019-06-17 MED ORDER — FENTANYL CITRATE (PF) 100 MCG/2ML IJ SOLN
INTRAMUSCULAR | Status: AC | PRN
  Administered 2019-06-17: 25 ug via INTRAVENOUS
  Administered 2019-06-17: 50 ug via INTRAVENOUS

## 2019-06-17 MED ORDER — MIDAZOLAM HCL 2 MG/2ML IJ SOLN
INTRAMUSCULAR | Status: AC
  Filled 2019-06-17: qty 2

## 2019-06-17 MED ORDER — SODIUM CHLORIDE 0.9 % IV SOLN
INTRAVENOUS | Status: AC | PRN
  Administered 2019-06-17: 10 mL/h via INTRAVENOUS

## 2019-06-17 MED ORDER — FENTANYL CITRATE (PF) 100 MCG/2ML IJ SOLN
INTRAMUSCULAR | Status: AC
  Filled 2019-06-17: qty 2

## 2019-06-17 MED ORDER — LIDOCAINE HCL (PF) 1 % IJ SOLN
INTRAMUSCULAR | Status: AC
  Filled 2019-06-17: qty 30

## 2019-06-17 NOTE — Discharge Instructions (Addendum)
It was my pleasure taking care of you today!   Please call the urology clinic in the morning to schedule a follow up appointment.   Return to ER for new or worsening symptoms, any additional concerns.

## 2019-06-17 NOTE — Discharge Instructions (Signed)
Percutaneous Kidney Biopsy, Care After °This sheet gives you information about how to care for yourself after your procedure. Your health care provider may also give you more specific instructions. If you have problems or questions, contact your health care provider. °What can I expect after the procedure? °After the procedure, it is common to have: °· Pain or soreness near the area where the needle went through your skin (biopsy site). °· Bright pink or cloudy urine for 24 hours after the procedure. °Follow these instructions at home: °Activity °· Return to your normal activities as told by your health care provider. Ask your health care provider what activities are safe for you. °· Do not drive for 24 hours if you were given a medicine to help you relax (sedative). °· Do not lift anything that is heavier than 10 lb (4.5 kg) until your health care provider tells you that it is safe. °· Avoid activities that take a lot of effort (are strenuous) until your health care provider approves. Most people will have to wait 2 weeks before returning to activities such as exercise or sexual intercourse. °General instructions ° °· Take over-the-counter and prescription medicines only as told by your health care provider. °· You may eat and drink after your procedure. Follow instructions from your health care provider about eating or drinking restrictions. °· Check your biopsy site every day for signs of infection. Check for: °? More redness, swelling, or pain. °? More fluid or blood. °? Warmth. °? Pus or a bad smell. °· Keep all follow-up visits as told by your health care provider. This is important. °Contact a health care provider if: °· You have more redness, swelling, or pain around your biopsy site. °· You have more fluid or blood coming from your biopsy site. °· Your biopsy site feels warm to the touch. °· You have pus or a bad smell coming from your biopsy site. °· You have blood in your urine more than 24 hours after  your procedure. °Get help right away if: °· You have dark red or brown urine. °· You have a fever. °· You are unable to urinate. °· You feel burning when you urinate. °· You feel faint. °· You have severe pain in your abdomen or side. °This information is not intended to replace advice given to you by your health care provider. Make sure you discuss any questions you have with your health care provider. °Document Released: 07/03/2013 Document Revised: 10/13/2017 Document Reviewed: 08/12/2016 °Elsevier Patient Education © 2020 [employer] °Moderate Conscious Sedation, Adult, Care After °These instructions provide you with information about caring for yourself after your procedure. Your health care provider may also give you more specific instructions. Your treatment has been planned according to current medical practices, but problems sometimes occur. Call your health care provider if you have any problems or questions after your procedure. °What can I expect after the procedure? °After your procedure, it is common: °· To feel sleepy for several hours. °· To feel clumsy and have poor balance for several hours. °· To have poor judgment for several hours. °· To vomit if you eat too soon. °Follow these instructions at home: °For at least 24 hours after the procedure: ° °· Do not: °? Participate in activities where you could fall or become injured. °? Drive. °? Use heavy machinery. °? Drink alcohol. °? Take sleeping pills or medicines that cause drowsiness. °? Make important decisions or sign legal documents. °? Take care of children on your   own. °· Rest. °Eating and drinking °· Follow the diet recommended by your health care provider. °· If you vomit: °? Drink water, juice, or soup when you can drink without vomiting. °? Make sure you have little or no nausea before eating solid foods. °General instructions °· Have a responsible adult stay with you until you are awake and alert. °· Take over-the-counter and  prescription medicines only as told by your health care provider. °· If you smoke, do not smoke without supervision. °· Keep all follow-up visits as told by your health care provider. This is important. °Contact a health care provider if: °· You keep feeling nauseous or you keep vomiting. °· You feel light-headed. °· You develop a rash. °· You have a fever. °Get help right away if: °· You have trouble breathing. °This information is not intended to replace advice given to you by your health care provider. Make sure you discuss any questions you have with your health care provider. °Document Released: 08/21/2013 Document Revised: 10/13/2017 Document Reviewed: 02/20/2016 °Elsevier Patient Education © 2020 Elsevier Inc. ° °

## 2019-06-17 NOTE — ED Triage Notes (Signed)
Pt endorses urinary retention since 1400 this afternoon after having foley removed after having a kidney biopsy. Axox4, VSS, NAD.

## 2019-06-17 NOTE — Progress Notes (Signed)
Patient ID: Ryan Jimenez, male   DOB: 29-Dec-1931, 85 y.o.   MRN: [id number]   RN calls to say pt not able to urinate much at all  "Not like this at home" Pt feeling distended  Discussed with Dr Earleen Newport Ordered bladder scan--- showing 340 cc per scan-- per RN  Ordered I/O cath per Dr Earleen Newport Reported 650 cc output Blood tinged to clear yellow and without clots per RN  Pt can be discharged home per Dr Earleen Newport Reassurance  Call MD if further issues

## 2019-06-17 NOTE — Procedures (Signed)
Interventional Radiology Procedure Note  Procedure: US guided biopsy of left kidney. Mx core biopsy acquired.   Complications: None  Recommendations:  - Ok to shower tomorrow - Do not submerge for 7 days - Routine care   Signed,  Dulcy Fanny. Earleen Newport, DO

## 2019-06-17 NOTE — Progress Notes (Signed)
Still having trouble voiding, Dr. Jacqualyn Posey notified.

## 2019-06-17 NOTE — Progress Notes (Signed)
Pt complaining of difficulty urinating.  Bloody urine note.  Ferne Coe PA in to see pt.  To push fluids and void again.

## 2019-06-17 NOTE — H&P (Signed)
Chief Complaint: Patient was seen in consultation today for random renal bx at the request of Patel,Jay  Referring Physician(s): Martha Lake  Supervising Physician: Corrie Mckusick  Patient Status: [facility] - Out-pt  History of Present Illness: Ryan Jimenez is a 85 y.o. male   CKD Previous glomerulonephritis/ renal vasculitis-- treated 2013 Noted new proteinuria- nephrotic range Worsening renal function For random renal biopsy   Past Medical History:  Diagnosis Date  . Anemia   . Arthritis   . Bronchitis   . CKD (chronic kidney disease)   . Diabetes mellitus   . Glomerulonephritis   . Gout   . Hypercholesterolemia   . Hypertension   . Near syncope 10/2011; 11/2011  . Pancytopenia   . Renal disorder   . Shortness of breath on exertion     Past Surgical History:  Procedure Laterality Date  . CATARACT EXTRACTION W/ INTRAOCULAR LENS IMPLANT  ~ 2009   right  . HYDROCELE EXCISION      Allergies: Amlodipine and Clonidine derivatives  Medications: Prior to Admission medications   Medication Sig Start Date End Date Taking? Authorizing Provider  allopurinol (ZYLOPRIM) 100 MG tablet Take 100 mg by mouth daily.   Yes [provider]  amLODipine (NORVASC) 10 MG tablet Take 10 mg by mouth daily.   Yes [provider]  aspirin EC 81 MG tablet Take 81 mg by mouth daily.   Yes [provider]  carvedilol (COREG) 12.5 MG tablet Take 1 tablet (12.5 mg total) by mouth 2 (two) times daily with a meal. 05/02/13  Yes Angiulli, Lavon Paganini, PA-C  Cholecalciferol (VITAMIN D) 125 MCG (5000 UT) CAPS Take 5,000 Units by mouth daily.   Yes [provider]  colchicine 0.6 MG tablet Take 0.6 mg by mouth daily as needed (gout).    Yes [provider]  dutasteride (AVODART) 0.5 MG capsule Take 0.5 mg by mouth daily.   Yes [provider]  fluticasone (FLONASE) 50 MCG/ACT nasal spray Place 2 sprays into both nostrils daily. Patient taking  differently: Place 2 sprays into both nostrils daily as needed for allergies.  05/29/18  Yes Yu, Amy V, PA-C  furosemide (LASIX) 40 MG tablet Take 40 mg by mouth daily.   Yes [provider]  LEVEMIR FLEXTOUCH 100 UNIT/ML Pen Inject 45-60 Units into the skin at bedtime. Per sliding scale 01/30/15  Yes [provider]  losartan (COZAAR) 50 MG tablet Take 50 mg by mouth 2 (two) times daily.   Yes [provider]  Multiple Vitamins-Minerals (MULTIVITAMIN WITH MINERALS) tablet Take 1 tablet by mouth daily.   Yes [provider]  rosuvastatin (CRESTOR) 10 MG tablet Take 1 tablet (10 mg total) by mouth at bedtime. 05/02/13  Yes Angiulli, Lavon Paganini, PA-C  tamsulosin (FLOMAX) 0.4 MG CAPS Take 1 capsule (0.4 mg total) by mouth at bedtime. 05/02/13  Yes Angiulli, Lavon Paganini, PA-C  Tetrahydroz-Dextran-PEG-Povid (EYE DROPS ADVANCED RELIEF OP) Place 1 drop into both eyes daily as needed (dry eyes).    Yes [provider]     Family History  Problem Relation Age of Onset  . Hypertension Mother   . Heart disease Mother   . Diabetes Sister     Social History   Socioeconomic History  . Marital status: Widowed    Spouse name: Not on file  . Number of children: Not on file  . Years of education: Not on file  . Highest education level: Not on file  Occupational  History  . Not on file  Social Needs  . Financial resource strain: Not on file  . Food insecurity    Worry: Not on file    Inability: Not on file  . Transportation needs    Medical: Not on file    Non-medical: Not on file  Tobacco Use  . Smoking status: Former Smoker    Packs/day: 1.00    Years: 40.00    Pack years: 40.00    Types: Cigarettes  . Smokeless tobacco: Never Used  Substance and Sexual Activity  . Alcohol use: No    Comment: 11/23/11 "couple ounces q month maybe"  . Drug use: No  . Sexual activity: Not Currently  Lifestyle  . Physical activity    Days per week: Not on file    Minutes  per session: Not on file  . Stress: Not on file  Relationships  . Social Herbalist on phone: Not on file    Gets together: Not on file    Attends religious service: Not on file    Active member of club or organization: Not on file    Attends meetings of clubs or organizations: Not on file    Relationship status: Not on file  Other Topics Concern  . Not on file  Social History Narrative  . Not on file    Review of Systems: A 12 point ROS discussed and pertinent positives are indicated in the HPI above.  All other systems are negative.  Review of Systems  Constitutional: Negative for activity change, fatigue and fever.  Respiratory: Negative for cough and shortness of breath.   Cardiovascular: Positive for leg swelling.  Musculoskeletal: Positive for gait problem.  Neurological: Positive for weakness.  Psychiatric/Behavioral: Negative for behavioral problems and confusion.    Vital Signs: BP (!) 162/73   Pulse (!) 59   Temp 97.8 F (36.6 C) (Oral)   Resp 18   Ht 5\' 9"  (1.753 m)   Wt 253 lb (114.8 kg)   SpO2 96%   BMI 37.36 kg/m   Physical Exam Vitals signs reviewed.  Cardiovascular:     Rate and Rhythm: Normal rate and regular rhythm.     Heart sounds: Normal heart sounds.  Pulmonary:     Breath sounds: Normal breath sounds.  Abdominal:     Tenderness: There is no abdominal tenderness.  Musculoskeletal: Normal range of motion.  Skin:    General: Skin is warm and dry.  Neurological:     Mental Status: He is alert and oriented to person, place, and time.  Psychiatric:        Mood and Affect: Mood normal.        Behavior: Behavior normal.        Thought Content: Thought content normal.        Judgment: Judgment normal.     Imaging: No results found.  Labs:  CBC: Recent Labs    06/17/19 0621  WBC 6.4  HGB 13.0  HCT 40.9  PLT 204    COAGS: Recent Labs    06/17/19 0621  INR 1.0    BMP: No results for input(s): NA, K, CL, CO2,  GLUCOSE, BUN, CALCIUM, CREATININE, GFRNONAA, GFRAA in the last 8760 hours.  Invalid input(s): CMP  LIVER FUNCTION TESTS: No results for input(s): BILITOT, AST, ALT, ALKPHOS, PROT, ALBUMIN in the last 8760 hours.  TUMOR MARKERS: No results for input(s): AFPTM, CEA, CA199, CHROMGRNA in the last 8760 hours.  Assessment and  Plan:  CKD4 Previous GN/vasulitis 2013-- treated -- remission Recent worsening renal function Proteinuria- nephrotic range Scheduled for random renal biopsy Risks and benefits of random renal bx was discussed with the patient and/or patient's family including, but not limited to bleeding, infection, damage to adjacent structures or low yield requiring additional tests.  All of the questions were answered and there is agreement to proceed. Consent signed and in chart.  Thank you for this interesting consult.  I greatly enjoyed [facility] and look forward to participating in their care.  A copy of this report was sent to the requesting provider on this date.  Electronically Signed: Lavonia Drafts, PA-C 06/17/2019, 7:07 AM   I spent a total of  30 Minutes   in face to face in clinical consultation, greater than 50% of which was counseling/coordinating care for random renal bx

## 2019-06-17 NOTE — Progress Notes (Signed)
Referring Physician(s): [place]  Supervising Physician: Corrie Mckusick  Patient Status:  [facility] - In-pt  Chief Complaint:  Hematuria post renal biopsy  Subjective:  Expected hematuria post renal bx Pt denies pain; N/V Denies dizziness Ate food given in [place] - no issue  Allergies: Amlodipine and Clonidine derivatives  Medications: Prior to Admission medications   Medication Sig Start Date End Date Taking? Authorizing Provider  allopurinol (ZYLOPRIM) 100 MG tablet Take 100 mg by mouth daily.   Yes [provider]  amLODipine (NORVASC) 10 MG tablet Take 10 mg by mouth daily.   Yes [provider]  aspirin EC 81 MG tablet Take 81 mg by mouth daily.   Yes [provider]  carvedilol (COREG) 12.5 MG tablet Take 1 tablet (12.5 mg total) by mouth 2 (two) times daily with a meal. 05/02/13  Yes Angiulli, Lavon Paganini, PA-C  Cholecalciferol (VITAMIN D) 125 MCG (5000 UT) CAPS Take 5,000 Units by mouth daily.   Yes [provider]  colchicine 0.6 MG tablet Take 0.6 mg by mouth daily as needed (gout).    Yes [provider]  dutasteride (AVODART) 0.5 MG capsule Take 0.5 mg by mouth daily.   Yes [provider]  fluticasone (FLONASE) 50 MCG/ACT nasal spray Place 2 sprays into both nostrils daily. Patient taking differently: Place 2 sprays into both nostrils daily as needed for allergies.  05/29/18  Yes Yu, Amy V, PA-C  furosemide (LASIX) 40 MG tablet Take 40 mg by mouth daily.   Yes [provider]  LEVEMIR FLEXTOUCH 100 UNIT/ML Pen Inject 45-60 Units into the skin at bedtime. Per sliding scale 01/30/15  Yes [provider]  losartan (COZAAR) 50 MG tablet Take 50 mg by mouth 2 (two) times daily.   Yes [provider]  Multiple Vitamins-Minerals (MULTIVITAMIN WITH MINERALS) tablet Take 1 tablet by mouth daily.   Yes [provider]  rosuvastatin (CRESTOR) 10 MG tablet Take 1 tablet (10 mg total) by mouth at  bedtime. 05/02/13  Yes Angiulli, Lavon Paganini, PA-C  tamsulosin (FLOMAX) 0.4 MG CAPS Take 1 capsule (0.4 mg total) by mouth at bedtime. 05/02/13  Yes Angiulli, Lavon Paganini, PA-C  Tetrahydroz-Dextran-PEG-Povid (EYE DROPS ADVANCED RELIEF OP) Place 1 drop into both eyes daily as needed (dry eyes).    Yes [provider]     Vital Signs: BP (!) 149/72   Pulse (!) 53   Temp 98 F (36.7 C) (Oral)   Resp 16   Ht 5\' 9"  (1.753 m)   Wt 253 lb (114.8 kg)   SpO2 94%   BMI 37.36 kg/m   Physical Exam Vitals signs reviewed.  Cardiovascular:     Rate and Rhythm: Normal rate and regular rhythm.  Pulmonary:     Effort: Pulmonary effort is normal.     Breath sounds: Normal breath sounds.  Genitourinary:    Comments: Urine is noted to be blood tinged Not frank blood No clots seen  Musculoskeletal: Normal range of motion.  Skin:    General: Skin is warm and dry.     Comments: Site of renal bx - left flank Is NT no bleeding No hematoma  Neurological:     Mental Status: He is alert and oriented to person, place, and time.  Psychiatric:        Behavior: Behavior normal.     Imaging: No results found.  Labs:  CBC: Recent Labs    06/17/19 0621  WBC 6.4  HGB 13.0  HCT 40.9  PLT 204    COAGS: Recent Labs    06/17/19 0621  INR 1.0    BMP: Recent Labs    06/17/19 0621  NA 139  K 3.5  CL 100  CO2 29  GLUCOSE 163*  BUN 26*  CALCIUM 9.1  CREATININE 2.59*  GFRNONAA 22*  GFRAA 25*    LIVER FUNCTION TESTS: No results for input(s): BILITOT, AST, ALT, ALKPHOS, PROT, ALBUMIN in the last 8760 hours.  Assessment and Plan:  Hematuria post renal bx Discussed with Dr Earleen Newport Will keep pt and hydrate Urinate once more before DC Will likely resolve   Electronically Signed: Lavonia Drafts, PA-C 06/17/2019, 11:23 AM   I spent a total of 15 Minutes at the the patient's bedside AND on the patient's hospital floor or unit, greater than 50% of which was  counseling/coordinating care for hematuria post renal bx

## 2019-06-17 NOTE — ED Provider Notes (Signed)
[facility] EMERGENCY DEPARTMENT Provider Note   CSN: [id number] Arrival date & time: 06/17/19  1659    History   Chief Complaint Chief Complaint  Patient presents with  . Urinary Retention    HPI Ryan Jimenez is a 85 y.o. male.     The history is provided by the patient and medical records. No language interpreter was used.   Ryan Jimenez is a 85 y.o. male  with a PMH as listed below who presents to the Emergency Department complaining of urinary retention.  Patient with new proteinuria in the nephrotic range with worsening renal function, therefore underwent renal biopsy by IR this morning.  He had a Foley catheter placed for procedure.  He reports that since the catheter was removed, he has been unable to urinate.  He states that he had a few dribbles, but no true urination since the procedure.  He was told that if this happened he needed to be evaluated.  He notes some suprapubic pressure, but denies any pain.  Just feels as if he really needs to urinate, but cannot.    Past Medical History:  Diagnosis Date  . Anemia   . Arthritis   . Bronchitis   . CKD (chronic kidney disease)   . Diabetes mellitus   . Glomerulonephritis   . Gout   . Hypercholesterolemia   . Hypertension   . Near syncope 10/2011; 11/2011  . Pancytopenia   . Renal disorder   . Shortness of breath on exertion     Patient Active Problem List   Diagnosis Date Noted  . CKD (chronic kidney disease) stage 3, GFR 30-59 ml/min (HCC) 04/04/2015  . Chest pain 04/03/2015  . 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 CVA (cerebral infarction) 04/23/2013  . Hidradenitis suppurativa of right axilla 04/05/2012  . Abscess of axillary region 04/04/2012  . Neutropenia 04/04/2012  . Pancytopenia due to chemotherapy ([place]) 04/04/2012  . Anemia 04/04/2012  . Generalized weakness 04/04/2012  . Physical deconditioning 04/04/2012  . Benign positional vertigo 11/28/2011  .  Hypertension 11/23/2011  . Renal failure 11/23/2011  . Type 2 diabetes mellitus ([place]) 11/23/2011  . Hypercholesterolemia     Past Surgical History:  Procedure Laterality Date  . CATARACT EXTRACTION W/ INTRAOCULAR LENS IMPLANT  ~ 2009   right  . HYDROCELE EXCISION          Home Medications    Prior to Admission medications   Medication Sig Start Date End Date Taking? Authorizing Provider  allopurinol (ZYLOPRIM) 100 MG tablet Take 100 mg by mouth daily.    [provider]  amLODipine (NORVASC) 10 MG tablet Take 10 mg by mouth daily.    [provider]  aspirin EC 81 MG tablet Take 81 mg by mouth daily.    [provider]  carvedilol (COREG) 12.5 MG tablet Take 1 tablet (12.5 mg total) by mouth 2 (two) times daily with a meal. 05/02/13   Angiulli, Lavon Paganini, PA-C  Cholecalciferol (VITAMIN D) 125 MCG (5000 UT) CAPS Take 5,000 Units by mouth daily.    [provider]  colchicine 0.6 MG tablet Take 0.6 mg by mouth daily as needed (gout).     [provider]  dutasteride (AVODART) 0.5 MG capsule Take 0.5 mg by mouth daily.    [provider]  fluticasone (FLONASE) 50 MCG/ACT nasal spray Place 2 sprays into both nostrils daily. Patient taking differently: Place 2 sprays into both nostrils daily as needed  for allergies.  05/29/18   Tasia Catchings, Amy V, PA-C  furosemide (LASIX) 40 MG tablet Take 40 mg by mouth daily.    [provider]  LEVEMIR FLEXTOUCH 100 UNIT/ML Pen Inject 45-60 Units into the skin at bedtime. Per sliding scale 01/30/15   [provider]  losartan (COZAAR) 50 MG tablet Take 50 mg by mouth 2 (two) times daily.    [provider]  Multiple Vitamins-Minerals (MULTIVITAMIN WITH MINERALS) tablet Take 1 tablet by mouth daily.    [provider]  rosuvastatin (CRESTOR) 10 MG tablet Take 1 tablet (10 mg total) by mouth at bedtime. 05/02/13   Angiulli, Lavon Paganini, PA-C  tamsulosin (FLOMAX) 0.4 MG CAPS Take 1  capsule (0.4 mg total) by mouth at bedtime. 05/02/13   Angiulli, Lavon Paganini, PA-C  Tetrahydroz-Dextran-PEG-Povid (EYE DROPS ADVANCED RELIEF OP) Place 1 drop into both eyes daily as needed (dry eyes).     [provider]    Family History Family History  Problem Relation Age of Onset  . Hypertension Mother   . Heart disease Mother   . Diabetes Sister     Social History Social History   Tobacco Use  . Smoking status: Former Smoker    Packs/day: 1.00    Years: 40.00    Pack years: 40.00    Types: Cigarettes  . Smokeless tobacco: Never Used  Substance Use Topics  . Alcohol use: No    Comment: 11/23/11 "couple ounces q month maybe"  . Drug use: No     Allergies   Amlodipine and Clonidine derivatives   Review of Systems Review of Systems  Gastrointestinal: Positive for abdominal distention. Negative for abdominal pain, nausea and vomiting.  Genitourinary: Positive for difficulty urinating.  All other systems reviewed and are negative.    Physical Exam Updated Vital Signs BP (!) 163/75   Pulse 63   Temp 97.8 F (36.6 C) (Oral)   Resp 16   Ht 5\' 9"  (1.753 m)   Wt 114.8 kg   SpO2 95%   BMI 37.36 kg/m   Physical Exam Vitals signs and nursing note reviewed.  Constitutional:      General: He is not in acute distress.    Appearance: He is well-developed.  HENT:     Head: Normocephalic and atraumatic.  Neck:     Musculoskeletal: Neck supple.  Cardiovascular:     Rate and Rhythm: Normal rate and regular rhythm.     Heart sounds: Normal heart sounds. No murmur.  Pulmonary:     Effort: Pulmonary effort is normal. No respiratory distress.     Breath sounds: Normal breath sounds.  Abdominal:     Palpations: Abdomen is soft.     Comments: Mild suprapubic tenderness without rebound or guarding.  Skin:    General: Skin is warm and dry.  Neurological:     Mental Status: He is alert and oriented to person, place, and time.      ED Treatments / Results   Labs (all labs ordered are listed, but only abnormal results are displayed) Labs Reviewed  CBC WITH DIFFERENTIAL/PLATELET - Abnormal; Notable for the following components:      Result Value   MCV 79.5 (*)    MCH 25.9 (*)    All other components within normal limits  BASIC METABOLIC PANEL - Abnormal; Notable for the following components:   Sodium 131 (*)    Chloride 94 (*)    Glucose, Bld 184 (*)    BUN 29 (*)  Creatinine, Ser 2.76 (*)    GFR calc non Af Amer 20 (*)    GFR calc Af Amer 23 (*)    All other components within normal limits  CBG MONITORING, ED - Abnormal; Notable for the following components:   Glucose-Capillary 189 (*)    All other components within normal limits    EKG None  Radiology US Biopsy (kidney)  Result Date: 06/17/2019 INDICATION: 85 year old male with a history of chronic renal disease EXAM: ULTRASOUND-GUIDED MEDICAL KIDNEY BIOPSY MEDICATIONS: None. ANESTHESIA/SEDATION: Moderate (conscious) sedation was employed during this procedure. A total of Versed 1.5 mg and Fentanyl 75 mcg was administered intravenously. Moderate Sedation Time: 18 minutes. The patient's level of consciousness and vital signs were monitored continuously by radiology nursing throughout the procedure under my direct supervision. FLUOROSCOPY TIME:  Ultrasound COMPLICATIONS: None PROCEDURE: Informed written consent was obtained from the patient after a thorough discussion of the procedural risks, benefits and alternatives. All questions were addressed. Maximal Sterile Barrier Technique was utilized including caps, mask, sterile gowns, sterile gloves, sterile drape, hand hygiene and skin antiseptic. A timeout was performed prior to the initiation of the procedure. Patient was positioned prone position on the gantry table. Images were stored sent to PACs. Once the patient is prepped and draped in the usual sterile fashion, the skin and subcutaneous tissues overlying the left kidney were generously  infiltrated 1% lidocaine for local anesthesia. Advanced renal cortical thinning was observed. Using ultrasound guidance, a 15 gauge guide needle was advanced into the lateral cortex of the left kidney via transverse orientation. Once we confirmed location of the needle tip, multiple separate 16 gauge core biopsy were achieved. Gel-Foam slurry was infused with a small amount of saline. The needle was removed. Final images were stored. The patient tolerated the procedure well and remained hemodynamically stable throughout. No complications were encountered and no significant blood loss encountered. FINDINGS: Advanced renal cortical thinning. IMPRESSION: Status post left-sided medical renal biopsy. Signed, Dulcy Fanny. Dellia Nims, RPVI [facility] Radiology Electronically Signed   By: Corrie Mckusick D.O.   On: 06/17/2019 11:22    Procedures Procedures (including critical care time)  Medications Ordered in ED Medications - No data to display   Initial Impression / Assessment and Plan / ED Course  I have reviewed the triage vital signs and the nursing notes.  Pertinent labs & imaging results that were available during my care of the patient were reviewed by me and considered in my medical decision making (see chart for details).       Ryan Jimenez is a 85 y.o. male who presents to ED for urinary retention after undergoing IR guided renal biopsy this morning. He had > 500cc on bladder scan. Foley cath was placed with approx. 475 cc urine output. He feels improved. Hgb normal. Cr about baseline. Will have him call his urologist in the morning to schedule a follow up appointment and keep foley in place until that time. Reasons to return to ER were discussed and all questions answered.  Patient seen by and discussed with Dr. Kathrynn Humble who agrees with treatment plan.    Final Clinical Impressions(s) / ED Diagnoses   Final diagnoses:  Urinary retention     ED Discharge Orders    None       Ward, Ozella Almond, PA-C 06/17/19 2037    Varney Biles, MD 06/18/19 2153

## 2019-06-18 NOTE — ED Notes (Signed)
This nurse wasted 0.5 tablet/2.5 mg of Ambien witnessed by [employer].

## 2019-06-27 ENCOUNTER — Encounter (HOSPITAL_COMMUNITY): Payer: Self-pay

## 2019-07-03 ENCOUNTER — Emergency Department (HOSPITAL_COMMUNITY)
Admission: EM | Admit: 2019-07-03 | Discharge: 2019-07-03 | Disposition: A | Discharge status: Home / Self Care | Payer: Medicare Other | Attending: Emergency Medicine | Admitting: Emergency Medicine

## 2019-07-03 ENCOUNTER — Other Ambulatory Visit: Payer: Self-pay

## 2019-07-03 ENCOUNTER — Encounter (HOSPITAL_COMMUNITY): Payer: Self-pay | Admitting: Emergency Medicine

## 2019-07-03 DIAGNOSIS — R310 Gross hematuria: Secondary | ICD-10-CM | POA: Insufficient documentation

## 2019-07-03 DIAGNOSIS — Z79899 Other long term (current) drug therapy: Secondary | ICD-10-CM | POA: Insufficient documentation

## 2019-07-03 DIAGNOSIS — R319 Hematuria, unspecified: Secondary | ICD-10-CM | POA: Diagnosis present

## 2019-07-03 DIAGNOSIS — E1122 Type 2 diabetes mellitus with diabetic chronic kidney disease: Secondary | ICD-10-CM | POA: Insufficient documentation

## 2019-07-03 DIAGNOSIS — Z87891 Personal history of nicotine dependence: Secondary | ICD-10-CM | POA: Diagnosis not present

## 2019-07-03 DIAGNOSIS — I129 Hypertensive chronic kidney disease with stage 1 through stage 4 chronic kidney disease, or unspecified chronic kidney disease: Secondary | ICD-10-CM | POA: Insufficient documentation

## 2019-07-03 DIAGNOSIS — Z7982 Long term (current) use of aspirin: Secondary | ICD-10-CM | POA: Diagnosis not present

## 2019-07-03 DIAGNOSIS — N183 Chronic kidney disease, stage 3 (moderate): Secondary | ICD-10-CM | POA: Insufficient documentation

## 2019-07-03 LAB — URINALYSIS, ROUTINE W REFLEX MICROSCOPIC

## 2019-07-03 LAB — URINALYSIS, MICROSCOPIC (REFLEX): WBC, UA: >50 WBC/hpf (ref 0–5)

## 2019-07-03 MED ORDER — OXYBUTYNIN CHLORIDE 5 MG PO TABS
5.0000 mg | ORAL_TABLET | Freq: Once | ORAL | Status: AC
  Administered 2019-07-03: 5 mg via ORAL
  Filled 2019-07-03: qty 1

## 2019-07-03 MED ORDER — OXYBUTYNIN CHLORIDE ER 5 MG PO TB24: 5.0000 mg | ORAL_TABLET | Freq: Every day | ORAL | 0 refills | Status: DC

## 2019-07-03 MED ORDER — OXYCODONE HCL 5 MG PO TABS
5.0000 mg | ORAL_TABLET | Freq: Once | ORAL | Status: AC
  Administered 2019-07-03: 5 mg via ORAL
  Filled 2019-07-03: qty 1

## 2019-07-03 NOTE — Discharge Instructions (Signed)
Follow up with your urologist.  Return if you feel like you are unable to pee or get a fever.

## 2019-07-03 NOTE — ED Triage Notes (Signed)
Patient here with foley that was put in earlier this month.  He states he feels like he can't pee, he has some hematuria and a few clots in the drainage bag.

## 2019-07-03 NOTE — ED Provider Notes (Addendum)
[facility] EMERGENCY DEPARTMENT Provider Note   CSN: [id number] Arrival date & time: 07/03/19  0303    History   Chief Complaint Chief Complaint  Patient presents with  . Hematuria    HPI Ryan Jimenez is a 85 y.o. male.     85 yo M with a chief complaints of difficulty urinating and hematuria.  This is somewhat resolved upon arrival here.  States that he has some pain in the suprapubic region off and on.  Also have pain in the penis.  Felt that sometimes he felt like urine was not able to drain.  He has passed some clots.  Denies flank pain denies fevers.  The history is provided by the patient.  Hematuria This is a recurrent problem. The current episode started more than 2 days ago. The problem occurs constantly. The problem has been resolved. Pertinent negatives include no chest pain, no abdominal pain, no headaches and no shortness of breath. Nothing aggravates the symptoms. Nothing relieves the symptoms. He has tried nothing for the symptoms.    Past Medical History:  Diagnosis Date  . Anemia   . Arthritis   . Bronchitis   . CKD (chronic kidney disease)   . Diabetes mellitus   . Glomerulonephritis   . Gout   . Hypercholesterolemia   . Hypertension   . Near syncope 10/2011; 11/2011  . Pancytopenia   . Renal disorder   . Shortness of breath on exertion     Patient Active Problem List   Diagnosis Date Noted  . CKD (chronic kidney disease) stage 3, GFR 30-59 ml/min (HCC) 04/04/2015  . Chest pain 04/03/2015  . 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 CVA (cerebral infarction) 04/23/2013  . Hidradenitis suppurativa of right axilla 04/05/2012  . Abscess of axillary region 04/04/2012  . Neutropenia 04/04/2012  . Pancytopenia due to chemotherapy ([place]) 04/04/2012  . Anemia 04/04/2012  . Generalized weakness 04/04/2012  . Physical deconditioning 04/04/2012  . Benign positional vertigo 11/28/2011  . Hypertension 11/23/2011  . Renal  failure 11/23/2011  . Type 2 diabetes mellitus ([place]) 11/23/2011  . Hypercholesterolemia     Past Surgical History:  Procedure Laterality Date  . CATARACT EXTRACTION W/ INTRAOCULAR LENS IMPLANT  ~ 2009   right  . HYDROCELE EXCISION          Home Medications    Prior to Admission medications   Medication Sig Start Date End Date Taking? Authorizing Provider  allopurinol (ZYLOPRIM) 100 MG tablet Take 100 mg by mouth daily.    [provider]  amLODipine (NORVASC) 10 MG tablet Take 10 mg by mouth daily.    [provider]  aspirin EC 81 MG tablet Take 81 mg by mouth daily.    [provider]  carvedilol (COREG) 12.5 MG tablet Take 1 tablet (12.5 mg total) by mouth 2 (two) times daily with a meal. 05/02/13   Angiulli, Lavon Paganini, PA-C  Cholecalciferol (VITAMIN D) 125 MCG (5000 UT) CAPS Take 5,000 Units by mouth daily.    [provider]  colchicine 0.6 MG tablet Take 0.6 mg by mouth daily as needed (gout).     [provider]  dutasteride (AVODART) 0.5 MG capsule Take 0.5 mg by mouth daily.    [provider]  fluticasone (FLONASE) 50 MCG/ACT nasal spray Place 2 sprays into both nostrils daily. Patient taking differently: Place 2 sprays into both nostrils daily as needed for allergies.  05/29/18   Ok Edwards,  PA-C  furosemide (LASIX) 40 MG tablet Take 40 mg by mouth daily.    [provider]  LEVEMIR FLEXTOUCH 100 UNIT/ML Pen Inject 45-60 Units into the skin at bedtime. Per sliding scale 01/30/15   [provider]  losartan (COZAAR) 50 MG tablet Take 50 mg by mouth 2 (two) times daily.    [provider]  Multiple Vitamins-Minerals (MULTIVITAMIN WITH MINERALS) tablet Take 1 tablet by mouth daily.    [provider]  oxybutynin (DITROPAN-XL) 5 MG 24 hr tablet Take 1 tablet (5 mg total) by mouth at bedtime. 07/03/19   Deno Etienne, DO  rosuvastatin (CRESTOR) 10 MG tablet Take 1 tablet (10 mg total) by mouth at  bedtime. 05/02/13   Angiulli, Lavon Paganini, PA-C  tamsulosin (FLOMAX) 0.4 MG CAPS Take 1 capsule (0.4 mg total) by mouth at bedtime. 05/02/13   Angiulli, Lavon Paganini, PA-C  Tetrahydroz-Dextran-PEG-Povid (EYE DROPS ADVANCED RELIEF OP) Place 1 drop into both eyes daily as needed (dry eyes).     [provider]    Family History Family History  Problem Relation Age of Onset  . Hypertension Mother   . Heart disease Mother   . Diabetes Sister     Social History Social History   Tobacco Use  . Smoking status: Former Smoker    Packs/day: 1.00    Years: 40.00    Pack years: 40.00    Types: Cigarettes  . Smokeless tobacco: Never Used  Substance Use Topics  . Alcohol use: No    Comment: 11/23/11 "couple ounces q month maybe"  . Drug use: No     Allergies   Amlodipine and Clonidine derivatives   Review of Systems Review of Systems  Constitutional: Negative for chills and fever.  HENT: Negative for congestion and facial swelling.   Eyes: Negative for discharge and visual disturbance.  Respiratory: Negative for shortness of breath.   Cardiovascular: Negative for chest pain and palpitations.  Gastrointestinal: Negative for abdominal pain, diarrhea and vomiting.  Genitourinary: Positive for difficulty urinating, hematuria and penile pain.  Musculoskeletal: Negative for arthralgias and myalgias.  Skin: Negative for color change and rash.  Neurological: Negative for tremors, syncope and headaches.  Psychiatric/Behavioral: Negative for confusion and dysphoric mood.     Physical Exam Updated Vital Signs BP (!) 150/80   Pulse 67   Temp 98.1 F (36.7 C) (Oral)   Resp 16   SpO2 97%   Physical Exam Vitals signs and nursing note reviewed.  Constitutional:      Appearance: He is well-developed.  HENT:     Head: Normocephalic and atraumatic.  Eyes:     Pupils: Pupils are equal, round, and reactive to light.  Neck:     Musculoskeletal: Normal range of motion and neck supple.      Vascular: No JVD.  Cardiovascular:     Rate and Rhythm: Normal rate and regular rhythm.     Heart sounds: No murmur. No friction rub. No gallop.   Pulmonary:     Effort: No respiratory distress.     Breath sounds: No wheezing.  Abdominal:     General: There is no distension.     Tenderness: There is no guarding or rebound.  Genitourinary:    Comments: Foley catheter in place.  Gross hematuria.  Actively draining into the bag. Musculoskeletal: Normal range of motion.  Skin:    Coloration: Skin is not pale.     Findings: No rash.  Neurological:     Mental  Status: He is alert and oriented to person, place, and time.  Psychiatric:        Behavior: Behavior normal.      ED Treatments / Results  Labs (all labs ordered are listed, but only abnormal results are displayed) Labs Reviewed  URINALYSIS, ROUTINE W REFLEX MICROSCOPIC - Abnormal; Notable for the following components:      Result Value   Color, Urine RED (*)    APPearance TURBID (*)    Glucose, UA   (*)    Value: TEST NOT REPORTED DUE TO COLOR INTERFERENCE OF URINE PIGMENT   Hgb urine dipstick   (*)    Value: TEST NOT REPORTED DUE TO COLOR INTERFERENCE OF URINE PIGMENT   Bilirubin Urine   (*)    Value: TEST NOT REPORTED DUE TO COLOR INTERFERENCE OF URINE PIGMENT   Ketones, ur   (*)    Value: TEST NOT REPORTED DUE TO COLOR INTERFERENCE OF URINE PIGMENT   Protein, ur   (*)    Value: TEST NOT REPORTED DUE TO COLOR INTERFERENCE OF URINE PIGMENT   Nitrite   (*)    Value: TEST NOT REPORTED DUE TO COLOR INTERFERENCE OF URINE PIGMENT   Leukocytes,Ua   (*)    Value: TEST NOT REPORTED DUE TO COLOR INTERFERENCE OF URINE PIGMENT   All other components within normal limits  URINALYSIS, MICROSCOPIC (REFLEX) - Abnormal; Notable for the following components:   Bacteria, UA MANY (*)    Non Squamous Epithelial PRESENT (*)    All other components within normal limits  URINE CULTURE    EKG None  Radiology No results found.   Procedures Procedures (including critical care time) Emergency Focused Ultrasound Exam Limited ultrasound of the Bladder.   Performed and interpreted by Dr. Tyrone Nine Indication: evaluation for urinary retention Transverse and Sagittal views of the bladder are obtained and calculations are performed to determine an estimated bladder volume for signs of post-renal obstruction.  Findings: Bladder is 4 ccfull Interpretation: no evidence of outlet obstruction Images archived electronically.  CPT Codes: [phone] and [phone] Medications Ordered in ED Medications  oxybutynin (DITROPAN) tablet 5 mg (has no administration in time range)     Initial Impression / Assessment and Plan / ED Course  I have reviewed the triage vital signs and the nursing notes.  Pertinent labs & imaging results that were available during my care of the patient were reviewed by me and considered in my medical decision making (see chart for details).        85 yo M with a chief complaints of difficulty urinating.  Patient had a Foley catheter placed about a month ago and has had pain off and on with this.  Noticed some hematuria over the past week or so.  Felt like his urine was not draining into the bag and he was having some increased discomfort.  This had improved somewhat by the time he arrived here.  Bedside ultrasound with no signs of urinary retention.  We will send a UA for possible infection.  Give treatment for possible bladder spasms.  Urology follow-up.  After the patient had been put up for discharge the patient's family member arrived and requested that I discussed the results with her.  She also was wondering why the urologist had not seen him while he was in the emergency department.  I discussed the current work-up and recommended outpatient follow-up.  She is a bit hesitant to have him leave without having something else tried  for him.  Will irrigate the bladder at bedside.  5:39 AM:  I have discussed the  diagnosis/risks/treatment options with the patient and believe the pt to be eligible for discharge home to follow-up with Urology. We also discussed returning to the ED immediately if new or worsening sx occur. We discussed the sx which are most concerning (e.g., inability to urinate, fever) that necessitate immediate return. Medications administered to the patient during their visit and any new prescriptions provided to the patient are listed below.  Medications given during this visit Medications  oxybutynin (DITROPAN) tablet 5 mg (has no administration in time range)     The patient appears reasonably screen and/or stabilized for discharge and I doubt any other medical condition or other [facility] requiring further screening, evaluation, or treatment in the ED at this time prior to discharge.    Final Clinical Impressions(s) / ED Diagnoses   Final diagnoses:  Gross hematuria    ED Discharge Orders         Ordered    oxybutynin (DITROPAN-XL) 5 MG 24 hr tablet  Daily at bedtime     07/03/19 [place], [place], DO 07/03/19 [id number]

## 2019-07-05 LAB — URINE CULTURE: Culture: >=100000 — AB

## 2019-07-06 NOTE — Progress Notes (Signed)
ED Antimicrobial Stewardship Positive Culture Follow Up   Ryan Jimenez is an 85 y.o. male who presented to [facility] on 07/03/2019 with a chief complaint of  Chief Complaint  Patient presents with  . Hematuria    Recent Results (from the past 720 hour(s))  Urine culture     Status: Abnormal   Collection Time: 07/03/19  3:17 AM   Specimen: Urine, Catheterized  Result Value Ref Range Status   Specimen Description URINE, CATHETERIZED  Final   Special Requests   Final    NONE Performed at [facility], [address] [place], [place] [address]    Culture (A)  Final    >=100,000 COLONIES/mL METHICILLIN RESISTANT STAPHYLOCOCCUS AUREUS 40,000 COLONIES/mL ACINETOBACTER BAUMANNII    Report Status 07/05/2019 FINAL  Final   Organism ID, Bacteria METHICILLIN RESISTANT STAPHYLOCOCCUS AUREUS (A)  Final   Organism ID, Bacteria ACINETOBACTER BAUMANNII (A)  Final      Susceptibility   Acinetobacter baumannii - MIC*    CEFTAZIDIME 4 SENSITIVE Sensitive     CEFTRIAXONE 16 INTERMEDIATE Intermediate     CIPROFLOXACIN <=0.25 SENSITIVE Sensitive     GENTAMICIN <=1 SENSITIVE Sensitive     IMIPENEM <=0.25 SENSITIVE Sensitive     PIP/TAZO <=4 SENSITIVE Sensitive     TRIMETH/SULFA <=20 SENSITIVE Sensitive     CEFEPIME 2 SENSITIVE Sensitive     AMPICILLIN/SULBACTAM <=2 SENSITIVE Sensitive     * 40,000 COLONIES/mL ACINETOBACTER BAUMANNII   Methicillin resistant staphylococcus aureus - MIC*    CIPROFLOXACIN >=8 RESISTANT Resistant     GENTAMICIN <=0.5 SENSITIVE Sensitive     NITROFURANTOIN <=16 SENSITIVE Sensitive     OXACILLIN >=4 RESISTANT Resistant     TETRACYCLINE <=1 SENSITIVE Sensitive     VANCOMYCIN <=0.5 SENSITIVE Sensitive     TRIMETH/SULFA <=10 SENSITIVE Sensitive     CLINDAMYCIN >=8 RESISTANT Resistant     RIFAMPIN <=0.5 SENSITIVE Sensitive     Inducible Clindamycin NEGATIVE Sensitive     * >=100,000 COLONIES/mL METHICILLIN RESISTANT STAPHYLOCOCCUS AUREUS    []  Treated  with N/A, organism resistant to prescribed antimicrobial [x]  Patient discharged originally without antimicrobial agent and treatment is now indicated  New antibiotic prescription: bactrim single strength (400mg -80mg ) 1 tablet PO BID x 14 days  ED Provider: Carmon Sails, PA  [place], Rande Lawman 07/06/2019, 9:43 AM Clinical Pharmacist Monday - Friday phone -  [phone] Saturday - Sunday phone - [phone]

## 2019-07-07 ENCOUNTER — Telehealth: Payer: Self-pay | Admitting: Emergency Medicine

## 2019-07-07 NOTE — Telephone Encounter (Signed)
Post ED Visit - Positive Culture Follow-up: Unsuccessful Patient Follow-up  Culture assessed and recommendations reviewed by:  []  Elenor Quinones, Pharm.D. []  Heide Guile, Pharm.D., BCPS AQ-ID []  Parks Neptune, Pharm.D., BCPS []  Alycia Rossetti, Pharm.D., BCPS []  [place], [place].D., BCPS, AAHIVP []  Legrand Como, Pharm.D., BCPS, AAHIVP []  Wynell Balloon, PharmD [x]  Salome Arnt, PharmD, BCPS  Positive urine culture  [x]  Patient discharged without antimicrobial prescription and treatment is now indicated []  Organism is resistant to prescribed ED discharge antimicrobial []  Patient with positive blood cultures   Unable to contact patient by phone, letter will be sent to address on file  Plan: Bactrim SS (400mg -80 mg) PO BID x 14 days, Carmon Sails PA Followup with urology.  Sandi Raveling Alyas Creary 07/07/2019, 3:04 PM

## 2019-07-24 ENCOUNTER — Emergency Department (HOSPITAL_COMMUNITY)
Admission: EM | Admit: 2019-07-24 | Discharge: 2019-07-24 | Disposition: A | Discharge status: Home / Self Care | Payer: Medicare Other | Attending: Emergency Medicine | Admitting: Emergency Medicine

## 2019-07-24 ENCOUNTER — Encounter (HOSPITAL_COMMUNITY): Payer: Self-pay | Admitting: Emergency Medicine

## 2019-07-24 ENCOUNTER — Other Ambulatory Visit: Payer: Self-pay

## 2019-07-24 DIAGNOSIS — Z79899 Other long term (current) drug therapy: Secondary | ICD-10-CM | POA: Diagnosis not present

## 2019-07-24 DIAGNOSIS — Z7982 Long term (current) use of aspirin: Secondary | ICD-10-CM | POA: Diagnosis not present

## 2019-07-24 DIAGNOSIS — E1122 Type 2 diabetes mellitus with diabetic chronic kidney disease: Secondary | ICD-10-CM | POA: Diagnosis not present

## 2019-07-24 DIAGNOSIS — R5381 Other malaise: Secondary | ICD-10-CM

## 2019-07-24 DIAGNOSIS — Z87891 Personal history of nicotine dependence: Secondary | ICD-10-CM | POA: Diagnosis not present

## 2019-07-24 DIAGNOSIS — I129 Hypertensive chronic kidney disease with stage 1 through stage 4 chronic kidney disease, or unspecified chronic kidney disease: Secondary | ICD-10-CM | POA: Diagnosis not present

## 2019-07-24 DIAGNOSIS — R42 Dizziness and giddiness: Secondary | ICD-10-CM | POA: Diagnosis present

## 2019-07-24 DIAGNOSIS — R110 Nausea: Secondary | ICD-10-CM | POA: Diagnosis not present

## 2019-07-24 DIAGNOSIS — Z658 Other specified problems related to psychosocial circumstances: Secondary | ICD-10-CM | POA: Diagnosis not present

## 2019-07-24 DIAGNOSIS — N183 Chronic kidney disease, stage 3 (moderate): Secondary | ICD-10-CM | POA: Diagnosis not present

## 2019-07-24 LAB — BASIC METABOLIC PANEL
Anion gap: 10 (ref 5–15)
BUN: 35 mg/dL — ABNORMAL HIGH (ref 8–23)
CO2: 24 mmol/L (ref 22–32)
Calcium: 9.2 mg/dL (ref 8.9–10.3)
Chloride: 102 mmol/L (ref 98–111)
Creatinine, Ser: 2.9 mg/dL — ABNORMAL HIGH (ref 0.61–1.24)
GFR calc Af Amer: 22 mL/min — ABNORMAL LOW (ref >60)
GFR calc non Af Amer: 19 mL/min — ABNORMAL LOW (ref >60)
Glucose, Bld: 201 mg/dL — ABNORMAL HIGH (ref 70–99)
Potassium: 4.5 mmol/L (ref 3.5–5.1)
Sodium: 136 mmol/L (ref 135–145)

## 2019-07-24 LAB — URINALYSIS, ROUTINE W REFLEX MICROSCOPIC
Bilirubin Urine: NEGATIVE
Glucose, UA: NEGATIVE mg/dL
Hgb urine dipstick: NEGATIVE
Ketones, ur: NEGATIVE mg/dL
Nitrite: POSITIVE — AB
Protein, ur: >=300 mg/dL — AB
Specific Gravity, Urine: 1.011 (ref 1.005–1.030)
pH: 6 (ref 5.0–8.0)

## 2019-07-24 LAB — CBC WITH DIFFERENTIAL/PLATELET
Abs Immature Granulocytes: 0.02 10*3/uL (ref 0.00–0.07)
Basophils Absolute: 0 10*3/uL (ref 0.0–0.1)
Basophils Relative: 1 %
Eosinophils Absolute: 0.2 10*3/uL (ref 0.0–0.5)
Eosinophils Relative: 4 %
HCT: 39.3 % (ref 39.0–52.0)
Hemoglobin: 12.6 g/dL — ABNORMAL LOW (ref 13.0–17.0)
Immature Granulocytes: 0 %
Lymphocytes Relative: 19 %
Lymphs Abs: 1.2 10*3/uL (ref 0.7–4.0)
MCH: 26.8 pg (ref 26.0–34.0)
MCHC: 32.1 g/dL (ref 30.0–36.0)
MCV: 83.4 fL (ref 80.0–100.0)
Monocytes Absolute: 0.4 10*3/uL (ref 0.1–1.0)
Monocytes Relative: 6 %
Neutro Abs: 4.2 10*3/uL (ref 1.7–7.7)
Neutrophils Relative %: 70 %
Platelets: 182 10*3/uL (ref 150–400)
RBC: 4.71 MIL/uL (ref 4.22–5.81)
RDW: 15.1 % (ref 11.5–15.5)
WBC: 6 10*3/uL (ref 4.0–10.5)
nRBC: 0 % (ref 0.0–0.2)

## 2019-07-24 NOTE — ED Triage Notes (Signed)
Per [facility] pt from home for dizzy and nausea that started around 9am today when got up to use bathroom. Will subside when sits still, but returns with movement.  Vitals: 160/77, 94HR, 18R, 100% on RA, CBG 165. Temp 97.3

## 2019-07-24 NOTE — ED Notes (Signed)
Pt stuck for IV and blood work, but unsuccessful. Royce RN made aware that ultrasound is needed.

## 2019-07-24 NOTE — Discharge Instructions (Signed)
The testing today does not show any serious problems.  You continue to have renal insufficiency.  Take all the antibiotics for UTI until gone.  We sent a urine culture today that your urologist will need to follow-up on when it is done in 1 or 2 days.  Call them tomorrow to arrange a time for discussion or evaluation of that.

## 2019-07-24 NOTE — ED Provider Notes (Signed)
[facility] DEPT Provider Note   CSN: [id number] Arrival date & time: 07/24/19  1030     History   Chief Complaint Chief Complaint  Patient presents with  . Dizziness  . Nausea    HPI Ryan Jimenez is a 85 y.o. male.     HPI   He presents for evaluation of nausea and dizziness which started around 9 AM today, only to help to use the bathroom.  He feels better.  He is under distress, because last night his deceased wife's sister, died.  He states that he has been reminiscing about her and missing her, now.  He denies chest pain, blurred vision, shortness of breath, vomiting, focal weakness or paresthesia.  He came here for evaluation, by EMS.  No recent illnesses.  No known sick contacts.  He is currently on last day of treatment for a UTI, following removal of the Foley catheter, several weeks ago.  He sometimes feels like his urine "cuts off," when he urinates.  He denies dysuria or urinary frequency.  There are no other known modifying factors.   Past Medical History:  Diagnosis Date  . Anemia   . Arthritis   . Bronchitis   . CKD (chronic kidney disease)   . Diabetes mellitus   . Glomerulonephritis   . Gout   . Hypercholesterolemia   . Hypertension   . Near syncope 10/2011; 11/2011  . Pancytopenia   . Renal disorder   . Shortness of breath on exertion     Patient Active Problem List   Diagnosis Date Noted  . CKD (chronic kidney disease) stage 3, GFR 30-59 ml/min (HCC) 04/04/2015  . Chest pain 04/03/2015  . 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 CVA (cerebral infarction) 04/23/2013  . Hidradenitis suppurativa of right axilla 04/05/2012  . Abscess of axillary region 04/04/2012  . Neutropenia 04/04/2012  . Pancytopenia due to chemotherapy ([place]) 04/04/2012  . Anemia 04/04/2012  . Generalized weakness 04/04/2012  . Physical deconditioning 04/04/2012  . Benign positional vertigo 11/28/2011  . Hypertension 11/23/2011  .  Renal failure 11/23/2011  . Type 2 diabetes mellitus ([place]) 11/23/2011  . Hypercholesterolemia     Past Surgical History:  Procedure Laterality Date  . CATARACT EXTRACTION W/ INTRAOCULAR LENS IMPLANT  ~ 2009   right  . HYDROCELE EXCISION          Home Medications    Prior to Admission medications   Medication Sig Start Date End Date Taking? Authorizing Provider  allopurinol (ZYLOPRIM) 100 MG tablet Take 100 mg by mouth daily.   Yes [provider]  amLODipine (NORVASC) 10 MG tablet Take 10 mg by mouth daily.   Yes [provider]  aspirin EC 81 MG tablet Take 81 mg by mouth daily.   Yes [provider]  carvedilol (COREG) 12.5 MG tablet Take 1 tablet (12.5 mg total) by mouth 2 (two) times daily with a meal. Patient taking differently: Take 12.5 mg by mouth 2 (two) times daily with a meal. 10am and 10pm 05/02/13  Yes Angiulli, Lavon Paganini, PA-C  Cholecalciferol (VITAMIN D) 125 MCG (5000 UT) CAPS Take 5,000 Units by mouth daily.   Yes [provider]  colchicine 0.6 MG tablet Take 0.6 mg by mouth daily as needed (gout).    Yes [provider]  dutasteride (AVODART) 0.5 MG capsule Take 0.5 mg by mouth daily.   Yes [provider]  fluticasone (FLONASE) 50 MCG/ACT nasal spray Place 2 sprays  into both nostrils daily. Patient taking differently: Place 2 sprays into both nostrils daily as needed for allergies.  05/29/18  Yes Yu, Amy V, PA-C  furosemide (LASIX) 40 MG tablet Take 40 mg by mouth daily.   Yes [provider]  LEVEMIR FLEXTOUCH 100 UNIT/ML Pen Inject 45-60 Units into the skin at bedtime. Per sliding scale 01/30/15  Yes [provider]  losartan (COZAAR) 50 MG tablet Take 50 mg by mouth 2 (two) times daily.   Yes [provider]  Meth-Hyo-M Bl-Na Phos-Ph Sal (URIBEL) 118 MG CAPS Take 118 mg by mouth as needed (bladder spasms).   Yes [provider]  Multiple Vitamin (MULTIVITAMIN) tablet Take 1  tablet by mouth daily.   Yes [provider]  Multiple Vitamins-Minerals (MULTIVITAMIN WITH MINERALS) tablet Take 1 tablet by mouth daily.   Yes [provider]  rosuvastatin (CRESTOR) 10 MG tablet Take 1 tablet (10 mg total) by mouth at bedtime. 05/02/13  Yes Angiulli, Lavon Paganini, PA-C  tamsulosin (FLOMAX) 0.4 MG CAPS Take 1 capsule (0.4 mg total) by mouth at bedtime. Patient taking differently: Take 0.8 mg by mouth at bedtime.  05/02/13  Yes Angiulli, Lavon Paganini, PA-C  Tetrahydroz-Dextran-PEG-Povid (EYE DROPS ADVANCED RELIEF OP) Place 1 drop into both eyes daily as needed (dry eyes).    Yes [provider]  oxybutynin (DITROPAN-XL) 5 MG 24 hr tablet Take 1 tablet (5 mg total) by mouth at bedtime. Patient not taking: Reported on 07/24/2019 07/03/19   Deno Etienne, DO    Family History Family History  Problem Relation Age of Onset  . Hypertension Mother   . Heart disease Mother   . Diabetes Sister     Social History Social History   Tobacco Use  . Smoking status: Former Smoker    Packs/day: 1.00    Years: 40.00    Pack years: 40.00    Types: Cigarettes  . Smokeless tobacco: Never Used  Substance Use Topics  . Alcohol use: No    Comment: 11/23/11 "couple ounces q month maybe"  . Drug use: No     Allergies   Amlodipine and Clonidine derivatives   Review of Systems Review of Systems  All other systems reviewed and are negative.    Physical Exam Updated Vital Signs BP (!) 166/75   Pulse 61   Temp 97.9 F (36.6 C)   Resp 17   SpO2 95%   Physical Exam Vitals signs and nursing note reviewed.  Constitutional:      Appearance: He is well-developed.  HENT:     Head: Normocephalic and atraumatic.     Right Ear: External ear normal.     Left Ear: External ear normal.  Eyes:     Conjunctiva/sclera: Conjunctivae normal.     Pupils: Pupils are equal, round, and reactive to light.  Neck:     Musculoskeletal: Normal range of motion and neck supple.      Trachea: Phonation normal.  Cardiovascular:     Rate and Rhythm: Normal rate and regular rhythm.     Heart sounds: Normal heart sounds.  Pulmonary:     Effort: Pulmonary effort is normal.     Breath sounds: Normal breath sounds.  Abdominal:     Palpations: Abdomen is soft.     Tenderness: There is no abdominal tenderness.  Musculoskeletal: Normal range of motion.  Skin:    General: Skin is warm and dry.  Neurological:     Mental Status: He is alert and  oriented to person, place, and time.     Cranial Nerves: No cranial nerve deficit.     Sensory: No sensory deficit.     Motor: No abnormal muscle tone.     Coordination: Coordination normal.     Comments: No dysarthria or aphasia.  No pronator drift.  No ataxia.  Psychiatric:        Mood and Affect: Mood normal.        Behavior: Behavior normal.        Thought Content: Thought content normal.        Judgment: Judgment normal.      ED Treatments / Results  Labs (all labs ordered are listed, but only abnormal results are displayed) Labs Reviewed  BASIC METABOLIC PANEL - Abnormal; Notable for the following components:      Result Value   Glucose, Bld 201 (*)    BUN 35 (*)    Creatinine, Ser 2.90 (*)    GFR calc non Af Amer 19 (*)    GFR calc Af Amer 22 (*)    All other components within normal limits  CBC WITH DIFFERENTIAL/PLATELET - Abnormal; Notable for the following components:   Hemoglobin 12.6 (*)    All other components within normal limits  URINALYSIS, ROUTINE W REFLEX MICROSCOPIC - Abnormal; Notable for the following components:   Color, Urine GREEN (*)    Protein, ur >=300 (*)    Nitrite POSITIVE (*)    Leukocytes,Ua SMALL (*)    Bacteria, UA MANY (*)    All other components within normal limits  URINE CULTURE    EKG EKG Interpretation  Date/Time:  Wednesday July 24 2019 10:53:41 EDT Ventricular Rate:  61 PR Interval:    QRS Duration: 150 QT Interval:  466 QTC Calculation: 470 R Axis:   76 Text  Interpretation:  Sinus rhythm Right bundle branch block since last tracing no significant change Confirmed by Daleen Bo [phone]) on 07/24/2019 11:13:50 AM   Radiology No results found.  Procedures Procedures (including critical care time)  Medications Ordered in ED Medications - No data to display   Initial Impression / Assessment and Plan / ED Course  I have reviewed the triage vital signs and the nursing notes.  Pertinent labs & imaging results that were available during my care of the patient were reviewed by me and considered in my medical decision making (see chart for details).  Clinical Course as of Jul 24 1419  Wed Jul 24, 2019  1408 Normal except presence of protein, nitrate, leukocytes, white cells, bacteria  Urinalysis, Routine w reflex microscopic(!) [EW]  1408 Normal except elevated glucose, BUN, creatinine  Basic metabolic panel(!) [EW]  123456 Normal  CBC with Differential(!) [EW]    Clinical Course User Index [EW] Daleen Bo, MD        Patient Vitals for the past 24 hrs:  BP Temp Pulse Resp SpO2  07/24/19 1130 (!) 166/75 - 61 17 95 %  07/24/19 1100 (!) 149/69 - 72 (!) 29 95 %  07/24/19 1052 (!) 166/69 97.9 F (36.6 C) 65 17 99 %    2:09 PM Reevaluation with update and discussion. After initial assessment and treatment, an updated evaluation reveals patient is comfortable, he is eaten and feels well.  His daughter is here now.  I discussed findings with daughter and patient, all questions were answered. Daleen Bo   Medical Decision Making: Nonspecific symptoms with psychosocial stressor, death of a close friend.  Recently treated for urinary  retention, now being maintained without Foley catheter and has stable chronic renal insufficiency.  Abnormal urine but recent treatment, ongoing with last dose today.  Will send urine culture, and encourage patient to follow-up with his urologist for discussion of need for additional treatment.  CRITICAL  CARE-no Performed by: Daleen Bo  Nursing Notes Reviewed/ Care Coordinated Applicable Imaging Reviewed Interpretation of Laboratory Data incorporated into ED treatment  The patient appears reasonably screened and/or stabilized for discharge and I doubt any other medical condition or other [facility] requiring further screening, evaluation, or treatment in the ED at this time prior to discharge.  Plan: Home Medications-continue usual medications; Home Treatments-regular diet and oral intake; return here if the recommended treatment, does not improve the symptoms; Recommended follow up-urology follow-up regarding urine culture, within 2 to 3 days.   Final Clinical Impressions(s) / ED Diagnoses   Final diagnoses:  Psychosocial stressors  [facility]    ED Discharge Orders    None       Daleen Bo, MD 07/24/19 1420

## 2019-07-24 NOTE — ED Notes (Signed)
ED Provider at bedside. 

## 2019-07-27 LAB — URINE CULTURE: Culture: >=100000 — AB

## 2019-07-28 ENCOUNTER — Telehealth: Payer: Self-pay | Admitting: Emergency Medicine

## 2019-07-28 NOTE — Telephone Encounter (Signed)
Post ED Visit - Positive Culture Follow-up: Successful Patient Follow-Up  Culture assessed and recommendations reviewed by:  []  Elenor Quinones, Pharm.D. []  Heide Guile, Pharm.D., BCPS AQ-ID []  Parks Neptune, Pharm.D., BCPS []  Alycia Rossetti, Pharm.D., BCPS []  Rapelje, Pharm.D., BCPS, AAHIVP []  Legrand Como, Pharm.D., BCPS, AAHIVP []  Salome Arnt, PharmD, BCPS []  Johnnette Gourd, PharmD, BCPS []  Hughes Better, PharmD, BCPS []  Leeroy Cha, PharmD Samul Dada PharmD  Positive urine culture  []  Patient discharged without antimicrobial prescription and treatment is now indicated [x]  Organism is resistant to prescribed ED discharge antimicrobial []  Patient with positive blood cultures  Changes discussed with ED provider: Domenic Moras PA New antibiotic prescription Symptom check if symptomatic, start keflex 250mg  po bid x 7 days   patient states symptoms have resolved , patient states followed up with the urologist and states was given new rx for cephalexin 250mg  .   Hazle Nordmann 07/28/2019, 3:36 PM

## 2019-07-28 NOTE — Progress Notes (Signed)
ED Antimicrobial Stewardship Positive Culture Follow Up   KAMANI RUTENBERG is an 85 y.o. male who presented to [facility] on 07/24/2019 with a chief complaint of  Chief Complaint  Patient presents with  . Dizziness  . Nausea    Recent Results (from the past 720 hour(s))  Urine culture     Status: Abnormal   Collection Time: 07/03/19  3:17 AM   Specimen: Urine, Catheterized  Result Value Ref Range Status   Specimen Description URINE, CATHETERIZED  Final   Special Requests   Final    NONE Performed at [facility], [address], [place], [place] [address]    Culture (A)  Final    >=100,000 COLONIES/mL METHICILLIN RESISTANT STAPHYLOCOCCUS AUREUS 40,000 COLONIES/mL ACINETOBACTER BAUMANNII    Report Status 07/05/2019 FINAL  Final   Organism ID, Bacteria METHICILLIN RESISTANT STAPHYLOCOCCUS AUREUS (A)  Final   Organism ID, Bacteria ACINETOBACTER BAUMANNII (A)  Final      Susceptibility   Acinetobacter baumannii - MIC*    CEFTAZIDIME 4 SENSITIVE Sensitive     CEFTRIAXONE 16 INTERMEDIATE Intermediate     CIPROFLOXACIN <=0.25 SENSITIVE Sensitive     GENTAMICIN <=1 SENSITIVE Sensitive     IMIPENEM <=0.25 SENSITIVE Sensitive     PIP/TAZO <=4 SENSITIVE Sensitive     TRIMETH/SULFA <=20 SENSITIVE Sensitive     CEFEPIME 2 SENSITIVE Sensitive     AMPICILLIN/SULBACTAM <=2 SENSITIVE Sensitive     * 40,000 COLONIES/mL ACINETOBACTER BAUMANNII   Methicillin resistant staphylococcus aureus - MIC*    CIPROFLOXACIN >=8 RESISTANT Resistant     GENTAMICIN <=0.5 SENSITIVE Sensitive     NITROFURANTOIN <=16 SENSITIVE Sensitive     OXACILLIN >=4 RESISTANT Resistant     TETRACYCLINE <=1 SENSITIVE Sensitive     VANCOMYCIN <=0.5 SENSITIVE Sensitive     TRIMETH/SULFA <=10 SENSITIVE Sensitive     CLINDAMYCIN >=8 RESISTANT Resistant     RIFAMPIN <=0.5 SENSITIVE Sensitive     Inducible Clindamycin NEGATIVE Sensitive     * >=100,000 COLONIES/mL METHICILLIN RESISTANT STAPHYLOCOCCUS AUREUS  Urine  culture     Status: Abnormal   Collection Time: 07/24/19  2:17 PM   Specimen: Urine, Random  Result Value Ref Range Status   Specimen Description   Final    URINE, RANDOM Performed at [facility], [place] [address] [place], [place] [address]    Special Requests   Final    NONE Performed at [facility], [place] [address], [place], [place] [address]    Culture >=100,000 COLONIES/mL ESCHERICHIA COLI (A)  Final   Report Status 07/27/2019 FINAL  Final   Organism ID, Bacteria ESCHERICHIA COLI (A)  Final      Susceptibility   Escherichia coli - MIC*    AMPICILLIN >=32 RESISTANT Resistant     CEFAZOLIN <=4 SENSITIVE Sensitive     CEFTRIAXONE <=1 SENSITIVE Sensitive     CIPROFLOXACIN >=4 RESISTANT Resistant     GENTAMICIN <=1 SENSITIVE Sensitive     IMIPENEM <=0.25 SENSITIVE Sensitive     NITROFURANTOIN <=16 SENSITIVE Sensitive     TRIMETH/SULFA >=320 RESISTANT Resistant     AMPICILLIN/SULBACTAM 16 INTERMEDIATE Intermediate     PIP/TAZO <=4 SENSITIVE Sensitive     Extended ESBL NEGATIVE Sensitive     * >=100,000 COLONIES/mL ESCHERICHIA COLI    [x]  Patient discharged originally without antimicrobial agent and treatment may now be indicated  Plan: Please call patient and assess for any urinary symptoms (fever, chills, abdominal pain,  dysuria, increased urinary frequency or urgency, flank pain).   If patient is not experiencing any symptoms, do not need to call in antibiotic prescription. Have patient follow up with urology.   If patient is experiencing symptoms of UTI, prescribe the following and have patient follow up with urology:  Cephalexin 250 mg PO BID x 7 days. No refill.  Dose based on calculated CrCl of 23 mL/min using SCr 2.9 from ED visit.  ED provider: Domenic Moras, PA-C  Lenis Noon, PharmD 07/28/2019, 2:12 PM Clinical Pharmacist [phone]

## 2019-09-05 ENCOUNTER — Telehealth: Payer: Self-pay
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
## 2020-02-10 ENCOUNTER — Ambulatory Visit (INDEPENDENT_AMBULATORY_CARE_PROVIDER_SITE_OTHER): Payer: Medicare Other | Admitting: Podiatry

## 2020-02-10 ENCOUNTER — Other Ambulatory Visit: Payer: Self-pay

## 2020-02-10 ENCOUNTER — Encounter: Payer: Self-pay | Admitting: Podiatry

## 2020-02-10 VITALS — BP 151/86 | HR 69 | Temp 97.1°F

## 2020-02-10 DIAGNOSIS — M79675 Pain in left toe(s): Secondary | ICD-10-CM

## 2020-02-10 DIAGNOSIS — B351 Tinea unguium: Secondary | ICD-10-CM

## 2020-02-10 DIAGNOSIS — N184 Chronic kidney disease, stage 4 (severe): Secondary | ICD-10-CM

## 2020-02-10 DIAGNOSIS — Z794 Long term (current) use of insulin: Secondary | ICD-10-CM

## 2020-02-10 DIAGNOSIS — M79674 Pain in right toe(s): Secondary | ICD-10-CM | POA: Diagnosis not present

## 2020-02-10 DIAGNOSIS — R600 Localized edema: Secondary | ICD-10-CM | POA: Diagnosis not present

## 2020-02-10 DIAGNOSIS — E0822 Diabetes mellitus due to underlying condition with diabetic chronic kidney disease: Secondary | ICD-10-CM | POA: Diagnosis not present

## 2020-02-10 NOTE — Patient Instructions (Signed)
Diabetes Mellitus and Foot Care Foot care is an important part of your health, especially when you have diabetes. Diabetes may cause you to have problems because of poor blood flow (circulation) to your feet and legs, which can cause your skin to:  Become thinner and drier.  Break more easily.  Heal more slowly.  Peel and crack. You may also have nerve damage (neuropathy) in your legs and feet, causing decreased feeling in them. This means that you may not notice minor injuries to your feet that could lead to more serious problems. Noticing and addressing any potential problems early is the best way to prevent future foot problems. How to care for your feet Foot hygiene  Wash your feet daily with warm water and mild soap. Do not use hot water. Then, pat your feet and the areas between your toes until they are completely dry. Do not soak your feet as this can dry your skin.  Trim your toenails straight across. Do not dig under them or around the cuticle. File the edges of your nails with an emery board or nail file.  Apply a moisturizing lotion or petroleum jelly to the skin on your feet and to dry, brittle toenails. Use lotion that does not contain alcohol and is unscented. Do not apply lotion between your toes. Shoes and socks  Wear clean socks or stockings every day. Make sure they are not too tight. Do not wear knee-high stockings since they may decrease blood flow to your legs.  Wear shoes that fit properly and have enough cushioning. Always look in your shoes before you put them on to be sure there are no objects inside.  To break in new shoes, wear them for just a few hours a day. This prevents injuries on your feet. Wounds, scrapes, corns, and calluses  Check your feet daily for blisters, cuts, bruises, sores, and redness. If you cannot see the bottom of your feet, use a mirror or ask someone for help.  Do not cut corns or calluses or try to remove them with medicine.  If you  find a minor scrape, cut, or break in the skin on your feet, keep it and the skin around it clean and dry. You may clean these areas with mild soap and water. Do not clean the area with peroxide, alcohol, or iodine.  If you have a wound, scrape, corn, or callus on your foot, look at it several times a day to make sure it is healing and not infected. Check for: ? Redness, swelling, or pain. ? Fluid or blood. ? Warmth. ? Pus or a bad smell. General instructions  Do not cross your legs. This may decrease blood flow to your feet.  Do not use heating pads or hot water bottles on your feet. They may burn your skin. If you have lost feeling in your feet or legs, you may not know this is happening until it is too late.  Protect your feet from hot and cold by wearing shoes, such as at the beach or on hot pavement.  Schedule a complete foot exam at least once a year (annually) or more often if you have foot problems. If you have foot problems, report any cuts, sores, or bruises to your health care provider immediately. Contact a health care provider if:  You have a medical condition that increases your risk of infection and you have any cuts, sores, or bruises on your feet.  You have an injury that is not   healing.  You have redness on your legs or feet.  You feel burning or tingling in your legs or feet.  You have pain or cramps in your legs and feet.  Your legs or feet are numb.  Your feet always feel cold.  You have pain around a toenail. Get help right away if:  You have a wound, scrape, corn, or callus on your foot and: ? You have pain, swelling, or redness that gets worse. ? You have fluid or blood coming from the wound, scrape, corn, or callus. ? Your wound, scrape, corn, or callus feels warm to the touch. ? You have pus or a bad smell coming from the wound, scrape, corn, or callus. ? You have a fever. ? You have a red line going up your leg. Summary  Check your feet every day  for cuts, sores, red spots, swelling, and blisters.  Moisturize feet and legs daily.  Wear shoes that fit properly and have enough cushioning.  If you have foot problems, report any cuts, sores, or bruises to your health care provider immediately.  Schedule a complete foot exam at least once a year (annually) or more often if you have foot problems. This information is not intended to replace advice given to you by your health care provider. Make sure you discuss any questions you have with your health care provider. Document Revised: 07/24/2019 Document Reviewed: 12/02/2016 Elsevier Patient Education  2020 [employer]  Peripheral Neuropathy Peripheral neuropathy is a type of nerve damage. It affects nerves that carry signals between the spinal cord and the arms, legs, and the rest of the body (peripheral nerves). It does not affect nerves in the spinal cord or brain. In peripheral neuropathy, one nerve or a group of nerves may be damaged. Peripheral neuropathy is a broad category that includes many specific nerve disorders, like diabetic neuropathy, hereditary neuropathy, and carpal tunnel syndrome. What are the causes? This condition may be caused by:  Diabetes. This is the most common cause of peripheral neuropathy.  Nerve injury.  Pressure or stress on a nerve that lasts a long time.  Lack (deficiency) of B vitamins. This can result from alcoholism, poor diet, or a restricted diet.  Infections.  Autoimmune diseases, such as rheumatoid arthritis and systemic lupus erythematosus.  Nerve diseases that are passed from parent to child (inherited).  Some medicines, such as cancer medicines (chemotherapy).  Poisonous (toxic) substances, such as lead and mercury.  Too little blood flowing to the legs.  Kidney disease.  Thyroid disease. In some cases, the cause of this condition is not known. What are the signs or symptoms? Symptoms of this condition depend on which of your  nerves is damaged. Common symptoms include:  Loss of feeling (numbness) in the feet, hands, or both.  Tingling in the feet, hands, or both.  Burning pain.  Very sensitive skin.  Weakness.  Not being able to move a part of the body (paralysis).  Muscle twitching.  Clumsiness or poor coordination.  Loss of balance.  Not being able to control your bladder.  Feeling dizzy.  Sexual problems. How is this diagnosed? Diagnosing and finding the cause of peripheral neuropathy can be difficult. Your health care provider will take your medical history and do a physical exam. A neurological exam will also be done. This involves checking things that are affected by your brain, spinal cord, and nerves (nervous system). For example, your health care provider will check your reflexes, how you move, and   what you can feel. You may have other tests, such as:  Blood tests.  Electromyogram (EMG) and nerve conduction tests. These tests check nerve function and how well the nerves are controlling the muscles.  Imaging tests, such as CT scans or MRI to rule out other causes of your symptoms.  Removing a small piece of nerve to be examined in a lab (nerve biopsy). This is rare.  Removing and examining a small amount of the fluid that surrounds the brain and spinal cord (lumbar puncture). This is rare. How is this treated? Treatment for this condition may involve:  Treating the underlying cause of the neuropathy, such as diabetes, kidney disease, or vitamin deficiencies.  Stopping medicines that can cause neuropathy, such as chemotherapy.  Medicine to relieve pain. Medicines may include: ? Prescription or over-the-counter pain medicine. ? Antiseizure medicine. ? Antidepressants. ? Pain-relieving patches that are applied to painful areas of skin.  Surgery to relieve pressure on a nerve or to destroy a nerve that is causing pain.  Physical therapy to help improve movement and  balance.  Devices to help you move around (assistive devices). Follow these instructions at home: Medicines  Take over-the-counter and prescription medicines only as told by your health care provider. Do not take any other medicines without first asking your health care provider.  Do not drive or use heavy machinery while taking prescription pain medicine. Lifestyle   Do not use any products that contain nicotine or tobacco, such as cigarettes and e-cigarettes. Smoking keeps blood from reaching damaged nerves. If you need help quitting, ask your health care provider.  Avoid or limit alcohol. Too much alcohol can cause a vitamin B deficiency, and vitamin B is needed for healthy nerves.  Eat a healthy diet. This includes: ? Eating foods that are high in fiber, such as fresh fruits and vegetables, whole grains, and beans. ? Limiting foods that are high in fat and processed sugars, such as fried or sweet foods. General instructions   If you have diabetes, work closely with your health care provider to keep your blood sugar under control.  If you have numbness in your feet: ? Check every day for signs of injury or infection. Watch for redness, warmth, and swelling. ? Wear padded socks and comfortable shoes. These help protect your feet.  Develop a good support system. Living with peripheral neuropathy can be stressful. Consider talking with a mental health specialist or joining a support group.  Use assistive devices and attend physical therapy as told by your health care provider. This may include using a walker or a cane.  Keep all follow-up visits as told by your health care provider. This is important. Contact a health care provider if:  You have new signs or symptoms of peripheral neuropathy.  You are struggling emotionally from dealing with peripheral neuropathy.  Your pain is not well-controlled. Get help right away if:  You have an injury or infection that is not healing  normally.  You develop new weakness in an arm or leg.  You fall frequently. Summary  Peripheral neuropathy is when the nerves in the arms, or legs are damaged, resulting in numbness, weakness, or pain.  There are many causes of peripheral neuropathy, including diabetes, pinched nerves, vitamin deficiencies, autoimmune disease, and hereditary conditions.  Diagnosing and finding the cause of peripheral neuropathy can be difficult. Your health care provider will take your medical history, do a physical exam, and do tests, including blood tests and nerve function tests.    Treatment involves treating the underlying cause of the neuropathy and taking medicines to help control pain. Physical therapy and assistive devices may also help. This information is not intended to replace advice given to you by your health care provider. Make sure you discuss any questions you have with your health care provider. Document Revised: 10/13/2017 Document Reviewed: 01/09/2017 Elsevier Patient Education  2020 [employer]  

## 2020-02-12 ENCOUNTER — Other Ambulatory Visit: Payer: Self-pay | Admitting: *Deleted

## 2020-02-12 ENCOUNTER — Telehealth (HOSPITAL_COMMUNITY): Payer: Self-pay

## 2020-02-12 DIAGNOSIS — N183 Chronic kidney disease, stage 3 unspecified: Secondary | ICD-10-CM

## 2020-02-12 NOTE — Progress Notes (Signed)
Subjective: Ryan Jimenez presents today referred by Nolene Ebbs, MD for diabetic foot evaluation.  Patient relates 20 year history of diabetes.  Patient denies any history of foot wounds.  Patient denies any history of numbness, tingling, burning, pins/needles sensations.  Patient states he may be starting dialysis soon. He still has not gotten access placed in his arm yet.  He has follow up appointment with Nephrologist.  Today, patient c/o of painful, discolored, thick toenails which interfere with daily activities.  Pain is aggravated when wearing enclosed shoe gear.   Past Medical History:  Diagnosis Date  . Anemia   . Arthritis   . Bronchitis   . CKD (chronic kidney disease)   . Diabetes mellitus   . Glomerulonephritis   . Gout   . Hypercholesterolemia   . Hypertension   . Near syncope 10/2011; 11/2011  . Pancytopenia   . Renal disorder   . Shortness of breath on exertion     Patient Active Problem List   Diagnosis Date Noted  . CKD (chronic kidney disease) stage 3, GFR 30-59 ml/min 04/04/2015  . Chest pain 04/03/2015  . 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 CVA (cerebral infarction) 04/23/2013  . Hidradenitis suppurativa of right axilla 04/05/2012  . Abscess of axillary region 04/04/2012  . Neutropenia 04/04/2012  . Pancytopenia due to chemotherapy ([place]) 04/04/2012  . Anemia 04/04/2012  . Generalized weakness 04/04/2012  . Physical deconditioning 04/04/2012  . Benign positional vertigo 11/28/2011  . Hypertension 11/23/2011  . Renal failure 11/23/2011  . Type 2 diabetes mellitus ([place]) 11/23/2011  . Hypercholesterolemia     Past Surgical History:  Procedure Laterality Date  . CATARACT EXTRACTION W/ INTRAOCULAR LENS IMPLANT  ~ 2009   right  . HYDROCELE EXCISION      Current Outpatient Medications on File Prior to Visit  Medication Sig Dispense Refill  . allopurinol (ZYLOPRIM) 100 MG tablet Take 100 mg by mouth daily.    [name] amLODipine  (NORVASC) 10 MG tablet Take 10 mg by mouth daily.    [name] aspirin EC 81 MG tablet Take 81 mg by mouth daily.    . carvedilol (COREG) 12.5 MG tablet Take 1 tablet (12.5 mg total) by mouth 2 (two) times daily with a meal. (Patient taking differently: Take 12.5 mg by mouth 2 (two) times daily with a meal. 10am and 10pm) 60 tablet 1  . Cholecalciferol (VITAMIN D) 125 MCG (5000 UT) CAPS Take 5,000 Units by mouth daily.    . colchicine 0.6 MG tablet Take 0.6 mg by mouth daily as needed (gout).     [name] dutasteride (AVODART) 0.5 MG capsule Take 0.5 mg by mouth daily.    . fluticasone (FLONASE) 50 MCG/ACT nasal spray Place 2 sprays into both nostrils daily. (Patient taking differently: Place 2 sprays into both nostrils daily as needed for allergies. ) 1 g 0  . furosemide (LASIX) 40 MG tablet Take 40 mg by mouth daily.    [name] LEVEMIR FLEXTOUCH 100 UNIT/ML Pen Inject 45-60 Units into the skin at bedtime. Per sliding scale  2  . losartan (COZAAR) 50 MG tablet Take 50 mg by mouth 2 (two) times daily.    . Meth-Hyo-M Bl-Na Phos-Ph Sal (URIBEL) 118 MG CAPS Take 118 mg by mouth as needed (bladder spasms).    . Multiple Vitamin (MULTIVITAMIN) tablet Take 1 tablet by mouth daily.    . Multiple Vitamins-Minerals (MULTIVITAMIN WITH MINERALS) tablet Take 1 tablet by mouth daily.    [name] oxybutynin (  DITROPAN-XL) 5 MG 24 hr tablet Take 1 tablet (5 mg total) by mouth at bedtime. (Patient not taking: Reported on 07/24/2019) 5 tablet 0  . rosuvastatin (CRESTOR) 10 MG tablet Take 1 tablet (10 mg total) by mouth at bedtime. 30 tablet 1  . tamsulosin (FLOMAX) 0.4 MG CAPS Take 1 capsule (0.4 mg total) by mouth at bedtime. (Patient taking differently: Take 0.8 mg by mouth at bedtime. ) 30 capsule 1  . Tetrahydroz-Dextran-PEG-Povid (EYE DROPS ADVANCED RELIEF OP) Place 1 drop into both eyes daily as needed (dry eyes).      No current facility-administered medications on file prior to visit.     Allergies  Allergen Reactions  . Amlodipine  Other (See Comments)    Not known  . Clonidine Derivatives Other (See Comments)    Not known    Social History   Occupational History  . Not on file  Tobacco Use  . Smoking status: Former Smoker    Packs/day: 1.00    Years: 40.00    Pack years: 40.00    Types: Cigarettes  . Smokeless tobacco: Never Used  Substance and Sexual Activity  . Alcohol use: No    Comment: 11/23/11 "couple ounces q month maybe"  . Drug use: No  . Sexual activity: Not Currently    Family History  Problem Relation Age of Onset  . Hypertension Mother   . Heart disease Mother   . Diabetes Sister     Immunization History  Administered Date(s) Administered  . Influenza Split 11/24/2011  . Pneumococcal Polysaccharide-23 11/24/2011    Review of systems: Positive Findings in bold print.  Constitutional:  chills, fatigue, fever, sweats, weight change Communication: Optometrist, sign Ecologist, hand writing, iPad/Android device Head: headaches, head injury Eyes: changes in vision, eye pain, glaucoma, cataracts, macular degeneration, diplopia, glare,  light sensitivity, eyeglasses or contacts, blindness Ears nose mouth throat: hearing impaired, hearing aids,  ringing in ears, deaf, sign language,  vertigo, nosebleeds,  rhinitis,  cold sores, snoring, swollen glands Cardiovascular: HTN, edema, arrhythmia, pacemaker in place, defibrillator in place, chest pain/tightness, chronic anticoagulation, blood clot, heart failure, MI Peripheral Vascular: leg cramps, varicose veins, blood clots, lymphedema, varicosities Respiratory:  difficulty breathing, denies congestion, SOB, wheezing, cough, emphysema Gastrointestinal: change in appetite or weight, abdominal pain, constipation, diarrhea, nausea, vomiting, vomiting blood, change in bowel habits, abdominal pain, jaundice, rectal bleeding, hemorrhoids, GERD Genitourinary:  nocturia,  pain on urination, polyuria,  blood in urine, Foley catheter, urinary  urgency, ESRD on hemodialysis Musculoskeletal: amputation, cramping, stiff joints, painful joints, decreased joint motion, fractures, OA, gout, hemiplegia, paraplegia, uses cane, wheelchair bound, uses walker, uses rollator Skin: +changes in toenails, color change, dryness, itching, mole changes,  rash, wound(s) Neurological: headaches, numbness in feet, paresthesias in feet, burning in feet, fainting,  seizures, change in speech,  headaches, memory problems/poor historian, cerebral palsy, weakness, paralysis, CVA, TIA Endocrine: diabetes, hypothyroidism, hyperthyroidism,  goiter, dry mouth, flushing, heat intolerance,  cold intolerance,  excessive thirst, denies polyuria,  nocturia Hematological:  easy bleeding, excessive bleeding, easy bruising, enlarged lymph nodes, on long term blood thinner, history of past transusions Allergy/immunological:  hives, eczema, frequent infections, multiple drug allergies, seasonal allergies, transplant recipient, multiple food allergies Psychiatric:  anxiety, depression, mood disorder, suicidal ideations, hallucinations, insomnia  Objective: Vitals:   02/10/20 1603  BP: (!) 151/86  Pulse: 69  Temp: (!) 97.1 F (36.2 C)    86 y.o. AA male WD, WN IN NAD. AAO X 3.  Vascular Examination: Capillary  refill time to digits immediate b/l. Palpable DP pulses b/l. Palpable PT pulses b/l. Pedal hair sparse b/l. Skin temperature gradient within normal limits b/l. Unilateral edema left LE. No pain with calf compression b/l.  Dermatological Examination: Pedal skin with normal turgor, texture and tone bilaterally. No open wounds bilaterally. No interdigital macerations bilaterally. Toenails 1-5 b/l elongated, dystrophic, thickened, crumbly with subungual debris and tenderness to dorsal palpation.  Musculoskeletal Examination: Normal muscle strength 5/5 to all lower extremity muscle groups bilaterally, no gross bony deformities bilaterally and no pain crepitus or joint  limitation noted with ROM b/l  Neurological Examination: Protective sensation intact 5/5 intact bilaterally with 10g monofilament b/l Vibratory sensation intact b/l  Assessment: 1. Pain due to onychomycosis of toenails of both feet   2. Localized edema   3. Diabetes mellitus due to underlying condition with stage 4 chronic kidney disease, with long-term current use of insulin (HCC)     Plan: -Diabetic foot examination performed on today's visit. -Discussed diabetic foot care principles. Literature dispensed on today. -Toenails 1-5 b/l were debrided in length and girth with sterile nail nippers and dremel without iatrogenic bleeding.  -Patient to continue soft, supportive shoe gear daily. -Patient to report any pedal injuries to medical professional immediately. -Patient/POA to call should there be question/concern in the interim.  Return in about 3 months (around 05/12/2020) for diabetic nail trim.

## 2020-02-12 NOTE — Telephone Encounter (Signed)

## 2020-02-13 ENCOUNTER — Other Ambulatory Visit: Payer: Self-pay

## 2020-02-13 ENCOUNTER — Ambulatory Visit (INDEPENDENT_AMBULATORY_CARE_PROVIDER_SITE_OTHER)
Admission: RE | Admit: 2020-02-13 | Discharge: 2020-02-13 | Disposition: A | Discharge status: Home / Self Care | Payer: Medicare Other | Source: Ambulatory Visit | Attending: Vascular Surgery | Admitting: Vascular Surgery

## 2020-02-13 ENCOUNTER — Encounter: Payer: Self-pay | Admitting: Vascular Surgery

## 2020-02-13 ENCOUNTER — Ambulatory Visit (INDEPENDENT_AMBULATORY_CARE_PROVIDER_SITE_OTHER): Payer: Medicare Other | Admitting: Vascular Surgery

## 2020-02-13 ENCOUNTER — Ambulatory Visit (HOSPITAL_COMMUNITY)
Admission: RE | Admit: 2020-02-13 | Discharge: 2020-02-13 | Disposition: A | Discharge status: Home / Self Care | Payer: Medicare Other | Source: Ambulatory Visit | Attending: Vascular Surgery | Admitting: Vascular Surgery

## 2020-02-13 VITALS — BP 162/92 | HR 76 | Temp 97.2°F | Resp 20 | Ht 69.0 in | Wt 247.0 lb

## 2020-02-13 DIAGNOSIS — N183 Chronic kidney disease, stage 3 unspecified: Secondary | ICD-10-CM

## 2020-02-13 DIAGNOSIS — N185 Chronic kidney disease, stage 5: Secondary | ICD-10-CM

## 2020-02-13 NOTE — Progress Notes (Signed)
Referring Physician: Dr. Graylon Gunning  Patient name: Ryan Jimenez MRN: [id number] DOB: February 27, 1932 Sex: male  REASON FOR CONSULT: Hemodialysis access  HPI: Ryan Jimenez is a 86 y.o. male, referred for evaluation and placement of a hemodialysis access.  The patient is not currently on hemodialysis.  He is CKD 5.  He is right-handed.  He has never had any prior access procedures.  Other medical problems include diabetes elevated cholesterol hypertension.  These are all currently stable.  Past Medical History:  Diagnosis Date  . Anemia   . Arthritis   . Bronchitis   . CKD (chronic kidney disease)   . Diabetes mellitus   . Glomerulonephritis   . Gout   . Hypercholesterolemia   . Hypertension   . Near syncope 10/2011; 11/2011  . Pancytopenia   . Renal disorder   . Shortness of breath on exertion    Past Surgical History:  Procedure Laterality Date  . CATARACT EXTRACTION W/ INTRAOCULAR LENS IMPLANT  ~ 2009   right  . HYDROCELE EXCISION      Family History  Problem Relation Age of Onset  . Hypertension Mother   . Heart disease Mother   . Diabetes Sister     SOCIAL HISTORY: Social History   Socioeconomic History  . Marital status: Widowed    Spouse name: Not on file  . Number of children: Not on file  . Years of education: Not on file  . Highest education level: Not on file  Occupational History  . Not on file  Tobacco Use  . Smoking status: Former Smoker    Packs/day: 1.00    Years: 40.00    Pack years: 40.00    Types: Cigarettes  . Smokeless tobacco: Never Used  Substance and Sexual Activity  . Alcohol use: No    Comment: 11/23/11 "couple ounces q month maybe"  . Drug use: No  . Sexual activity: Not Currently  Other Topics Concern  . Not on file  Social History Narrative  . Not on file   Social Determinants of Health   Financial Resource Strain:   . Difficulty of Paying Living Expenses:   Food Insecurity:   . Worried About Charity fundraiser in  the Last Year:   . Arboriculturist in the Last Year:   Transportation Needs:   . Film/video editor (Medical):   [name] Lack of Transportation (Non-Medical):   Physical Activity:   . Days of Exercise per Week:   . Minutes of Exercise per Session:   Stress:   . Feeling of Stress :   Social Connections:   . Frequency of Communication with Friends and Family:   . Frequency of Social Gatherings with Friends and Family:   . Attends Religious Services:   . Active Member of Clubs or Organizations:   . Attends Archivist Meetings:   [name] Marital Status:   Intimate Partner Violence:   . Fear of Current or Ex-Partner:   . Emotionally Abused:   [name] Physically Abused:   . Sexually Abused:     Allergies  Allergen Reactions  . Amlodipine Other (See Comments)    Not known  . Clonidine Derivatives Other (See Comments)    Not known    Current Outpatient Medications  Medication Sig Dispense Refill  . allopurinol (ZYLOPRIM) 100 MG tablet Take 100 mg by mouth daily.    [name] amLODipine (NORVASC) 10 MG tablet Take 10 mg by mouth daily.    [name]  aspirin EC 81 MG tablet Take 81 mg by mouth daily.    . carvedilol (COREG) 12.5 MG tablet Take 1 tablet (12.5 mg total) by mouth 2 (two) times daily with a meal. (Patient taking differently: Take 12.5 mg by mouth 2 (two) times daily with a meal. 10am and 10pm) 60 tablet 1  . Cholecalciferol (VITAMIN D) 125 MCG (5000 UT) CAPS Take 5,000 Units by mouth daily.    . colchicine 0.6 MG tablet Take 0.6 mg by mouth daily as needed (gout).     [name] dutasteride (AVODART) 0.5 MG capsule Take 0.5 mg by mouth daily.    . fluticasone (FLONASE) 50 MCG/ACT nasal spray Place 2 sprays into both nostrils daily. (Patient taking differently: Place 2 sprays into both nostrils daily as needed for allergies. ) 1 g 0  . furosemide (LASIX) 40 MG tablet Take 40 mg by mouth daily.    [name] LEVEMIR FLEXTOUCH 100 UNIT/ML Pen Inject 45-60 Units into the skin at bedtime. Per sliding scale  2  .  losartan (COZAAR) 50 MG tablet Take 50 mg by mouth 2 (two) times daily.    . meclizine (ANTIVERT) 12.5 MG tablet Take 12.5 mg by mouth 3 (three) times daily as needed.    . Meth-Hyo-M Bl-Na Phos-Ph Sal (URIBEL) 118 MG CAPS Take 118 mg by mouth as needed (bladder spasms).    . Multiple Vitamin (MULTIVITAMIN) tablet Take 1 tablet by mouth daily.    . Multiple Vitamins-Minerals (MULTIVITAMIN WITH MINERALS) tablet Take 1 tablet by mouth daily.    . nitrofurantoin, macrocrystal-monohydrate, (MACROBID) 100 MG capsule Take 100 mg by mouth 2 (two) times daily.    [name] oxybutynin (DITROPAN-XL) 5 MG 24 hr tablet Take 1 tablet (5 mg total) by mouth at bedtime. 5 tablet 0  . rosuvastatin (CRESTOR) 10 MG tablet Take 1 tablet (10 mg total) by mouth at bedtime. 30 tablet 1  . tacrolimus (PROGRAF) 1 MG capsule Take 4 mg by mouth 2 (two) times daily.    . tamsulosin (FLOMAX) 0.4 MG CAPS Take 1 capsule (0.4 mg total) by mouth at bedtime. (Patient taking differently: Take 0.8 mg by mouth at bedtime. ) 30 capsule 1  . Tetrahydroz-Dextran-PEG-Povid (EYE DROPS ADVANCED RELIEF OP) Place 1 drop into both eyes daily as needed (dry eyes).      No current facility-administered medications for this visit.    ROS:   General:  No weight loss, Fever, chills  HEENT: No recent headaches, no nasal bleeding, no visual changes, no sore throat  Neurologic: No dizziness, blackouts, seizures. No recent symptoms of stroke or mini- stroke. No recent episodes of slurred speech, or temporary blindness.  Cardiac: No recent episodes of chest pain/pressure, no shortness of breath at rest.  +shortness of breath with exertion.  Denies history of atrial fibrillation or irregular heartbeat  Vascular: No history of rest pain in feet.  No history of claudication.  No history of non-healing ulcer, No history of DVT   Pulmonary: No home oxygen, no productive cough, no hemoptysis,  No asthma or wheezing  Musculoskeletal:  [ ]  Arthritis, [ ]  Low  back pain,  [ ]  Joint pain  Hematologic:No history of hypercoagulable state.  No history of easy bleeding.  No history of anemia  Gastrointestinal: No hematochezia or melena,  No gastroesophageal reflux, no trouble swallowing  Urinary: [X]  chronic Kidney disease, [ ]  on HD - [ ]  MWF or [ ]  TTHS, [ ]  Burning with urination, [ ]  Frequent urination, [ ]   Difficulty urinating;   Skin: No rashes  Psychological: No history of anxiety,  No history of depression   Physical Examination  Vitals:   02/13/20 0952  BP: (!) 162/92  Pulse: 76  Resp: 20  Temp: (!) 97.2 F (36.2 C)  SpO2: 97%  Weight: 247 lb (112 kg)  Height: 5\' 9"  (1.753 m)    Body mass index is 36.48 kg/m.  General:  Alert and oriented, no acute distress HEENT: Normal Neck: No JVD Cardiac: Regular Rate and Rhythm Abdomen: Soft, non-tender, non-distended, no mass, no scars Skin: No rash Extremity Pulses:  2+ radial, brachial pulses bilaterally Musculoskeletal: No deformity or edema  Neurologic: Upper and lower extremity motor 5/5 and symmetric  DATA:  Patient had upper extremity arterial duplex exam today which showed no significant stenosis and normal arterial anatomy.  He also had a vein mapping ultrasound which showed a cephalic vein on the left side of 2 mm or less.  Right upper arm cephalic vein was about 3 mm diameter.  Basilic vein had an area of narrowing mid segment on the right side.  The basilic vein was small less than 2 mm on the left side.  ASSESSMENT: Patient needs long-term hemodialysis access.  He is CKD 5.   PLAN: Right brachiocephalic AV fistula scheduled for March 10, 2020.  Risk benefits possible complications and procedure details including but not limited to nonmaturation of fistula infection bleeding ischemic steal all discussed with the patient and his daughter today.  They understand and wish to proceed.   Ruta Hinds, MD [facility] Office:  [phone] Pager: [phone]

## 2020-02-13 NOTE — H&P (View-Only) (Signed)
Referring Physician: Dr. Graylon Gunning  Patient name: Ryan Jimenez MRN: [id number] DOB: 1932-07-09 Sex: male  REASON FOR CONSULT: Hemodialysis access  HPI: Ryan Jimenez is a 86 y.o. male, referred for evaluation and placement of a hemodialysis access.  The patient is not currently on hemodialysis.  He is CKD 5.  He is right-handed.  He has never had any prior access procedures.  Other medical problems include diabetes elevated cholesterol hypertension.  These are all currently stable.  Past Medical History:  Diagnosis Date  . Anemia   . Arthritis   . Bronchitis   . CKD (chronic kidney disease)   . Diabetes mellitus   . Glomerulonephritis   . Gout   . Hypercholesterolemia   . Hypertension   . Near syncope 10/2011; 11/2011  . Pancytopenia   . Renal disorder   . Shortness of breath on exertion    Past Surgical History:  Procedure Laterality Date  . CATARACT EXTRACTION W/ INTRAOCULAR LENS IMPLANT  ~ 2009   right  . HYDROCELE EXCISION      Family History  Problem Relation Age of Onset  . Hypertension Mother   . Heart disease Mother   . Diabetes Sister     SOCIAL HISTORY: Social History   Socioeconomic History  . Marital status: Widowed    Spouse name: Not on file  . Number of children: Not on file  . Years of education: Not on file  . Highest education level: Not on file  Occupational History  . Not on file  Tobacco Use  . Smoking status: Former Smoker    Packs/day: 1.00    Years: 40.00    Pack years: 40.00    Types: Cigarettes  . Smokeless tobacco: Never Used  Substance and Sexual Activity  . Alcohol use: No    Comment: 11/23/11 "couple ounces q month maybe"  . Drug use: No  . Sexual activity: Not Currently  Other Topics Concern  . Not on file  Social History Narrative  . Not on file   Social Determinants of Health   Financial Resource Strain:   . Difficulty of Paying Living Expenses:   Food Insecurity:   . Worried About Charity fundraiser in  the Last Year:   . Arboriculturist in the Last Year:   Transportation Needs:   . Film/video editor (Medical):   [name] Lack of Transportation (Non-Medical):   Physical Activity:   . Days of Exercise per Week:   . Minutes of Exercise per Session:   Stress:   . Feeling of Stress :   Social Connections:   . Frequency of Communication with Friends and Family:   . Frequency of Social Gatherings with Friends and Family:   . Attends Religious Services:   . Active Member of Clubs or Organizations:   . Attends Archivist Meetings:   [name] Marital Status:   Intimate Partner Violence:   . Fear of Current or Ex-Partner:   . Emotionally Abused:   [name] Physically Abused:   . Sexually Abused:     Allergies  Allergen Reactions  . Amlodipine Other (See Comments)    Not known  . Clonidine Derivatives Other (See Comments)    Not known    Current Outpatient Medications  Medication Sig Dispense Refill  . allopurinol (ZYLOPRIM) 100 MG tablet Take 100 mg by mouth daily.    [name] amLODipine (NORVASC) 10 MG tablet Take 10 mg by mouth daily.    [name]  aspirin EC 81 MG tablet Take 81 mg by mouth daily.    . carvedilol (COREG) 12.5 MG tablet Take 1 tablet (12.5 mg total) by mouth 2 (two) times daily with a meal. (Patient taking differently: Take 12.5 mg by mouth 2 (two) times daily with a meal. 10am and 10pm) 60 tablet 1  . Cholecalciferol (VITAMIN D) 125 MCG (5000 UT) CAPS Take 5,000 Units by mouth daily.    . colchicine 0.6 MG tablet Take 0.6 mg by mouth daily as needed (gout).     [name] dutasteride (AVODART) 0.5 MG capsule Take 0.5 mg by mouth daily.    . fluticasone (FLONASE) 50 MCG/ACT nasal spray Place 2 sprays into both nostrils daily. (Patient taking differently: Place 2 sprays into both nostrils daily as needed for allergies. ) 1 g 0  . furosemide (LASIX) 40 MG tablet Take 40 mg by mouth daily.    [name] LEVEMIR FLEXTOUCH 100 UNIT/ML Pen Inject 45-60 Units into the skin at bedtime. Per sliding scale  2  .  losartan (COZAAR) 50 MG tablet Take 50 mg by mouth 2 (two) times daily.    . meclizine (ANTIVERT) 12.5 MG tablet Take 12.5 mg by mouth 3 (three) times daily as needed.    . Meth-Hyo-M Bl-Na Phos-Ph Sal (URIBEL) 118 MG CAPS Take 118 mg by mouth as needed (bladder spasms).    . Multiple Vitamin (MULTIVITAMIN) tablet Take 1 tablet by mouth daily.    . Multiple Vitamins-Minerals (MULTIVITAMIN WITH MINERALS) tablet Take 1 tablet by mouth daily.    . nitrofurantoin, macrocrystal-monohydrate, (MACROBID) 100 MG capsule Take 100 mg by mouth 2 (two) times daily.    [name] oxybutynin (DITROPAN-XL) 5 MG 24 hr tablet Take 1 tablet (5 mg total) by mouth at bedtime. 5 tablet 0  . rosuvastatin (CRESTOR) 10 MG tablet Take 1 tablet (10 mg total) by mouth at bedtime. 30 tablet 1  . tacrolimus (PROGRAF) 1 MG capsule Take 4 mg by mouth 2 (two) times daily.    . tamsulosin (FLOMAX) 0.4 MG CAPS Take 1 capsule (0.4 mg total) by mouth at bedtime. (Patient taking differently: Take 0.8 mg by mouth at bedtime. ) 30 capsule 1  . Tetrahydroz-Dextran-PEG-Povid (EYE DROPS ADVANCED RELIEF OP) Place 1 drop into both eyes daily as needed (dry eyes).      No current facility-administered medications for this visit.    ROS:   General:  No weight loss, Fever, chills  HEENT: No recent headaches, no nasal bleeding, no visual changes, no sore throat  Neurologic: No dizziness, blackouts, seizures. No recent symptoms of stroke or mini- stroke. No recent episodes of slurred speech, or temporary blindness.  Cardiac: No recent episodes of chest pain/pressure, no shortness of breath at rest.  +shortness of breath with exertion.  Denies history of atrial fibrillation or irregular heartbeat  Vascular: No history of rest pain in feet.  No history of claudication.  No history of non-healing ulcer, No history of DVT   Pulmonary: No home oxygen, no productive cough, no hemoptysis,  No asthma or wheezing  Musculoskeletal:  [ ]  Arthritis, [ ]  Low  back pain,  [ ]  Joint pain  Hematologic:No history of hypercoagulable state.  No history of easy bleeding.  No history of anemia  Gastrointestinal: No hematochezia or melena,  No gastroesophageal reflux, no trouble swallowing  Urinary: [X]  chronic Kidney disease, [ ]  on HD - [ ]  MWF or [ ]  TTHS, [ ]  Burning with urination, [ ]  Frequent urination, [ ]   Difficulty urinating;   Skin: No rashes  Psychological: No history of anxiety,  No history of depression   Physical Examination  Vitals:   02/13/20 0952  BP: (!) 162/92  Pulse: 76  Resp: 20  Temp: (!) 97.2 F (36.2 C)  SpO2: 97%  Weight: 247 lb (112 kg)  Height: 5\' 9"  (1.753 m)    Body mass index is 36.48 kg/m.  General:  Alert and oriented, no acute distress HEENT: Normal Neck: No JVD Cardiac: Regular Rate and Rhythm Abdomen: Soft, non-tender, non-distended, no mass, no scars Skin: No rash Extremity Pulses:  2+ radial, brachial pulses bilaterally Musculoskeletal: No deformity or edema  Neurologic: Upper and lower extremity motor 5/5 and symmetric  DATA:  Patient had upper extremity arterial duplex exam today which showed no significant stenosis and normal arterial anatomy.  He also had a vein mapping ultrasound which showed a cephalic vein on the left side of 2 mm or less.  Right upper arm cephalic vein was about 3 mm diameter.  Basilic vein had an area of narrowing mid segment on the right side.  The basilic vein was small less than 2 mm on the left side.  ASSESSMENT: Patient needs long-term hemodialysis access.  He is CKD 5.   PLAN: Right brachiocephalic AV fistula scheduled for March 10, 2020.  Risk benefits possible complications and procedure details including but not limited to nonmaturation of fistula infection bleeding ischemic steal all discussed with the patient and his daughter today.  They understand and wish to proceed.   Ruta Hinds, MD [facility] Office:  [phone] Pager: [phone]

## 2020-03-07 ENCOUNTER — Encounter (HOSPITAL_COMMUNITY): Payer: Self-pay | Admitting: Vascular Surgery

## 2020-03-07 NOTE — Progress Notes (Signed)
Patient denies chest pain, shortness of breath, or cardiology visit. Patient reports being diagnosed with bronchitis earlier this week, being placed on antibiotics, and he finishes them tomorrow. Requested patient call Dr. Oneida Alar regarding same. Patient speaking in complete sentences during pre-op call. Patient reports being aware of COVID test on Monday.   PCP Dr. Jeanie Cooks  Below information provided regarding DM management.   How do I manage my blood sugar before surgery? . Check your blood sugar at least 4 times a day, starting 2 days before surgery, to make sure that the level is not too high or low. o Check your blood sugar the morning of your surgery when you wake up and every 2 hours until you get to the Short Stay unit. . If your blood sugar is less than 70 mg/dL, you will need to treat for low blood sugar: o Do not take insulin. o Treat a low blood sugar (less than 70 mg/dL) with  cup of clear juice (cranberry or apple), 4 glucose tablets, OR glucose gel. Recheck blood sugar in 15 minutes after treatment (to make sure it is greater than 70 mg/dL). If your blood sugar is not greater than 70 mg/dL on recheck, call [phone] o  for further instructions. . Report your blood sugar to the short stay nurse when you get to Short Stay.  . If you are admitted to the hospital after surgery: o Your blood sugar will be checked by the staff and you will probably be given insulin after surgery (instead of oral diabetes medicines) to make sure you have good blood sugar levels. o The goal for blood sugar control after surgery is 80-180 mg/dL.  WHAT DO I DO ABOUT MY DIABETES MEDICATION?   [name] Do not take oral diabetes medicines (pills) the morning of surgery.  . THE NIGHT BEFORE SURGERY, take _____50%______  of _normal levemir __________insulin dose.       . If your CBG is greater than 220 mg/dL, you may take  of your sliding scale (correction) dose of insulin.

## 2020-03-09 ENCOUNTER — Other Ambulatory Visit (HOSPITAL_COMMUNITY)
Admission: RE | Admit: 2020-03-09 | Discharge: 2020-03-09 | Disposition: A | Discharge status: Home / Self Care | Payer: Medicare Other | Source: Ambulatory Visit | Attending: Vascular Surgery | Admitting: Vascular Surgery

## 2020-03-09 DIAGNOSIS — Z20822 Contact with and (suspected) exposure to covid-19: Secondary | ICD-10-CM | POA: Diagnosis not present

## 2020-03-09 DIAGNOSIS — Z01812 Encounter for preprocedural laboratory examination: Secondary | ICD-10-CM | POA: Diagnosis present

## 2020-03-09 LAB — SARS CORONAVIRUS 2 (TAT 6-24 HRS): SARS Coronavirus 2: NEGATIVE

## 2020-03-09 NOTE — Anesthesia Preprocedure Evaluation (Addendum)
Anesthesia Evaluation  Patient identified by MRN, date of birth, ID band Patient awake    Reviewed: Allergy & Precautions, NPO status , Patient's Chart, lab work & pertinent test results  History of Anesthesia Complications Negative for: history of anesthetic complications  Airway Mallampati: III  TM Distance: >3 FB Neck ROM: Full    Dental  (+) Dental Advisory Given, Edentulous Upper, Edentulous Lower   Pulmonary neg shortness of breath, Recent URI , Resolved, former smoker,    breath sounds clear to auscultation       Cardiovascular hypertension, Pt. on medications and Pt. on home beta blockers (-) angina(-) Past MI  Rhythm:Regular  Left ventricle: The cavity size was normal. Systolic  function was normal. The estimated ejection fraction was in  the range of 50% to 55%. Although no diagnostic regional  wall motion abnormality was identified, this possibility  cannot be completely excluded on the basis of this study.  Doppler parameters are consistent with abnormal left  ventricular relaxation     Neuro/Psych Patient denies CVA negative psych ROS   GI/Hepatic negative GI ROS, Neg liver ROS,   Endo/Other  diabetes, Insulin DependentMorbid obesity  Renal/GU CRFRenal disease     Musculoskeletal  (+) Arthritis , Walks with a cane   Abdominal   Peds  Hematology  (+) Blood dyscrasia, anemia ,   Anesthesia Other Findings   Reproductive/Obstetrics                            Anesthesia Physical Anesthesia Plan  ASA: III  Anesthesia Plan: MAC   Post-op Pain Management:    Induction: Intravenous  PONV Risk Score and Plan: 1 and Propofol infusion and Treatment may vary due to age or medical condition  Airway Management Planned: Nasal Cannula  Additional Equipment: None  Intra-op Plan:   Post-operative Plan:   Informed Consent: I have reviewed the patients History and Physical,  chart, labs and discussed the procedure including the risks, benefits and alternatives for the proposed anesthesia with the patient or authorized representative who has indicated his/her understanding and acceptance.     Dental advisory given  Plan Discussed with: CRNA and Surgeon  Anesthesia Plan Comments: (PAT note by Karoline Caldwell, PA-C Pt recently treated by PCP for mild case of bronchitis. I called pt on 4/26/ to followup.  States he finished antibiotics on 4/25 and will take is last dose of prednisone on 4/27. He says he is feeling well, s/sx of illness, no cough or SOB. He says his symptoms resolved quickly with treatment. Pt is SDW and will need DOS labs and eval.  CKD V not yet on HD.  EKG 07/24/19: Sinus rhythm. Rate 61. RBBB.  TTE 04/23/13: Left ventricle: The cavity size was normal. Systolic  function was normal. The estimated ejection fraction was in  the range of 50% to 55%. Although no diagnostic regional  wall motion abnormality was identified, this possibility  cannot be completely excluded on the basis of this study.  Doppler parameters are consistent with abnormal left  ventricular relaxation (grade 1 diastolic dysfunction).  Transthoracic echocardiography. M-mode, complete  )       Anesthesia Quick Evaluation

## 2020-03-09 NOTE — Progress Notes (Addendum)
Anesthesia Chart Review: Same day workup  Pt recently treated by PCP for mild case of bronchitis. I called pt on 4/26/ to followup.  States he finished antibiotics on 4/25 and will take is last dose of prednisone on 4/27. He says he is feeling well, denies s/sx of illness, no cough or SOB. He says his symptoms resolved quickly with treatment. Pt is SDW and will need DOS labs and eval.  CKD V not yet on HD.  EKG 07/24/19: Sinus rhythm. Rate 61. RBBB.  TTE 04/23/13: Left ventricle: The cavity size was normal. Systolic  function was normal. The estimated ejection fraction was in  the range of 50% to 55%. Although no diagnostic regional  wall motion abnormality was identified, this possibility  cannot be completely excluded on the basis of this study.  Doppler parameters are consistent with abnormal left  ventricular relaxation (grade 1 diastolic dysfunction).  Transthoracic echocardiography. M-mode, complete   Wynonia Musty [facility] Short Stay Center/Anesthesiology Phone [phone] 03/09/2020 3:12 PM

## 2020-03-10 ENCOUNTER — Ambulatory Visit (HOSPITAL_COMMUNITY): Payer: Medicare Other | Admitting: Physician Assistant

## 2020-03-10 ENCOUNTER — Encounter (HOSPITAL_COMMUNITY)
Admission: RE | Disposition: A | Discharge status: Home / Self Care | Payer: Self-pay | Source: Home / Self Care | Attending: Vascular Surgery

## 2020-03-10 ENCOUNTER — Encounter (HOSPITAL_COMMUNITY): Payer: Self-pay | Admitting: Vascular Surgery

## 2020-03-10 ENCOUNTER — Ambulatory Visit (HOSPITAL_COMMUNITY)
Admission: RE | Admit: 2020-03-10 | Discharge: 2020-03-10 | Disposition: A | Discharge status: Home / Self Care | Payer: Medicare Other | Attending: Vascular Surgery | Admitting: Vascular Surgery

## 2020-03-10 DIAGNOSIS — M109 Gout, unspecified: Secondary | ICD-10-CM | POA: Diagnosis not present

## 2020-03-10 DIAGNOSIS — Z7982 Long term (current) use of aspirin: Secondary | ICD-10-CM | POA: Insufficient documentation

## 2020-03-10 DIAGNOSIS — Z87891 Personal history of nicotine dependence: Secondary | ICD-10-CM | POA: Insufficient documentation

## 2020-03-10 DIAGNOSIS — I120 Hypertensive chronic kidney disease with stage 5 chronic kidney disease or end stage renal disease: Secondary | ICD-10-CM | POA: Insufficient documentation

## 2020-03-10 DIAGNOSIS — E1122 Type 2 diabetes mellitus with diabetic chronic kidney disease: Secondary | ICD-10-CM | POA: Insufficient documentation

## 2020-03-10 DIAGNOSIS — Z79899 Other long term (current) drug therapy: Secondary | ICD-10-CM | POA: Insufficient documentation

## 2020-03-10 DIAGNOSIS — Z8249 Family history of ischemic heart disease and other diseases of the circulatory system: Secondary | ICD-10-CM | POA: Diagnosis not present

## 2020-03-10 DIAGNOSIS — E7800 Pure hypercholesterolemia, unspecified: Secondary | ICD-10-CM | POA: Diagnosis not present

## 2020-03-10 DIAGNOSIS — N185 Chronic kidney disease, stage 5: Secondary | ICD-10-CM

## 2020-03-10 DIAGNOSIS — M1990 Unspecified osteoarthritis, unspecified site: Secondary | ICD-10-CM | POA: Diagnosis not present

## 2020-03-10 DIAGNOSIS — Z888 Allergy status to other drugs, medicaments and biological substances status: Secondary | ICD-10-CM | POA: Insufficient documentation

## 2020-03-10 DIAGNOSIS — Z833 Family history of diabetes mellitus: Secondary | ICD-10-CM | POA: Insufficient documentation

## 2020-03-10 DIAGNOSIS — Z794 Long term (current) use of insulin: Secondary | ICD-10-CM | POA: Insufficient documentation

## 2020-03-10 DIAGNOSIS — N183 Chronic kidney disease, stage 3 unspecified: Secondary | ICD-10-CM

## 2020-03-10 HISTORY — DX: Personal history of pneumonia (recurrent): Z87.01

## 2020-03-10 HISTORY — PX: AV FISTULA PLACEMENT: SHX1204

## 2020-03-10 HISTORY — DX: Type 2 diabetes mellitus without complications: E11.9

## 2020-03-10 HISTORY — DX: Cardiac arrhythmia, unspecified: I49.9

## 2020-03-10 HISTORY — DX: Personal history of other diseases of the circulatory system: Z86.79

## 2020-03-10 LAB — POCT I-STAT, CHEM 8
BUN: 51 mg/dL — ABNORMAL HIGH (ref 8–23)
Calcium, Ion: 1.19 mmol/L (ref 1.15–1.40)
Chloride: 103 mmol/L (ref 98–111)
Creatinine, Ser: 4.5 mg/dL — ABNORMAL HIGH (ref 0.61–1.24)
Glucose, Bld: 133 mg/dL — ABNORMAL HIGH (ref 70–99)
HCT: 35 % — ABNORMAL LOW (ref 39.0–52.0)
Hemoglobin: 11.9 g/dL — ABNORMAL LOW (ref 13.0–17.0)
Potassium: 4.2 mmol/L (ref 3.5–5.1)
Sodium: 139 mmol/L (ref 135–145)
TCO2: 25 mmol/L (ref 22–32)

## 2020-03-10 LAB — GLUCOSE, CAPILLARY
Glucose-Capillary: 136 mg/dL — ABNORMAL HIGH (ref 70–99)
Glucose-Capillary: 170 mg/dL — ABNORMAL HIGH (ref 70–99)

## 2020-03-10 SURGERY — ARTERIOVENOUS (AV) FISTULA CREATION
Anesthesia: Monitor Anesthesia Care | Laterality: Right

## 2020-03-10 MED ORDER — LIDOCAINE 2% (20 MG/ML) 5 ML SYRINGE
INTRAMUSCULAR | Status: DC | PRN
  Administered 2020-03-10: 40 mg via INTRAVENOUS

## 2020-03-10 MED ORDER — OXYCODONE HCL 5 MG/5ML PO SOLN: 5.0000 mg | Freq: Once | ORAL | Status: DC | PRN

## 2020-03-10 MED ORDER — LIDOCAINE HCL (PF) 1 % IJ SOLN
INTRAMUSCULAR | Status: AC
  Filled 2020-03-10: qty 30

## 2020-03-10 MED ORDER — ACETAMINOPHEN 10 MG/ML IV SOLN: 1000.0000 mg | Freq: Once | INTRAVENOUS | Status: DC | PRN

## 2020-03-10 MED ORDER — FENTANYL CITRATE (PF) 250 MCG/5ML IJ SOLN
INTRAMUSCULAR | Status: AC
  Filled 2020-03-10: qty 5

## 2020-03-10 MED ORDER — OXYCODONE HCL 5 MG PO TABS: 5.0000 mg | ORAL_TABLET | Freq: Once | ORAL | Status: DC | PRN

## 2020-03-10 MED ORDER — CEFAZOLIN SODIUM-DEXTROSE 2-4 GM/100ML-% IV SOLN
2.0000 g | INTRAVENOUS | Status: AC
  Administered 2020-03-10: 08:00:00 2 g via INTRAVENOUS
  Filled 2020-03-10: qty 100

## 2020-03-10 MED ORDER — SODIUM CHLORIDE 0.9 % IV SOLN
INTRAVENOUS | Status: DC | PRN
  Administered 2020-03-10: 07:00:00 500 mL

## 2020-03-10 MED ORDER — SODIUM CHLORIDE 0.9 % IV SOLN: INTRAVENOUS | Status: DC

## 2020-03-10 MED ORDER — PROPOFOL 10 MG/ML IV BOLUS
INTRAVENOUS | Status: DC | PRN
  Administered 2020-03-10: 20 mg via INTRAVENOUS
  Administered 2020-03-10: 5 mg via INTRAVENOUS
  Administered 2020-03-10: 15 mg via INTRAVENOUS

## 2020-03-10 MED ORDER — CHLORHEXIDINE GLUCONATE 4 % EX LIQD: 60.0000 mL | Freq: Once | CUTANEOUS | Status: DC

## 2020-03-10 MED ORDER — GLYCOPYRROLATE PF 0.2 MG/ML IJ SOSY
PREFILLED_SYRINGE | INTRAMUSCULAR | Status: AC
  Filled 2020-03-10: qty 2

## 2020-03-10 MED ORDER — PHENYLEPHRINE 40 MCG/ML (10ML) SYRINGE FOR IV PUSH (FOR BLOOD PRESSURE SUPPORT)
PREFILLED_SYRINGE | INTRAVENOUS | Status: AC
  Filled 2020-03-10: qty 10

## 2020-03-10 MED ORDER — LIDOCAINE 2% (20 MG/ML) 5 ML SYRINGE
INTRAMUSCULAR | Status: AC
  Filled 2020-03-10: qty 10

## 2020-03-10 MED ORDER — PHENYLEPHRINE HCL-NACL 10-0.9 MG/250ML-% IV SOLN
INTRAVENOUS | Status: DC | PRN
  Administered 2020-03-10: 80 ug/min via INTRAVENOUS

## 2020-03-10 MED ORDER — PROPOFOL 10 MG/ML IV BOLUS
INTRAVENOUS | Status: AC
  Filled 2020-03-10: qty 20

## 2020-03-10 MED ORDER — ONDANSETRON HCL 4 MG/2ML IJ SOLN
INTRAMUSCULAR | Status: AC
  Filled 2020-03-10: qty 2

## 2020-03-10 MED ORDER — HEPARIN SODIUM (PORCINE) 1000 UNIT/ML IJ SOLN
INTRAMUSCULAR | Status: AC
  Filled 2020-03-10: qty 1

## 2020-03-10 MED ORDER — ACETAMINOPHEN 500 MG PO TABS: 1000.0000 mg | ORAL_TABLET | Freq: Once | ORAL | Status: DC | PRN

## 2020-03-10 MED ORDER — FENTANYL CITRATE (PF) 250 MCG/5ML IJ SOLN
INTRAMUSCULAR | Status: DC | PRN
  Administered 2020-03-10: 25 ug via INTRAVENOUS

## 2020-03-10 MED ORDER — GLYCOPYRROLATE PF 0.2 MG/ML IJ SOSY
PREFILLED_SYRINGE | INTRAMUSCULAR | Status: DC | PRN
  Administered 2020-03-10 (×2): .2 mg via INTRAVENOUS

## 2020-03-10 MED ORDER — OXYCODONE-ACETAMINOPHEN 7.5-325 MG PO TABS: 1.0000 | ORAL_TABLET | ORAL | 0 refills | Status: DC | PRN

## 2020-03-10 MED ORDER — PROPOFOL 500 MG/50ML IV EMUL
INTRAVENOUS | Status: DC | PRN
  Administered 2020-03-10: 50 ug/kg/min via INTRAVENOUS

## 2020-03-10 MED ORDER — 0.9 % SODIUM CHLORIDE (POUR BTL) OPTIME
TOPICAL | Status: DC | PRN
  Administered 2020-03-10: 07:00:00 1000 mL

## 2020-03-10 MED ORDER — PHENYLEPHRINE 40 MCG/ML (10ML) SYRINGE FOR IV PUSH (FOR BLOOD PRESSURE SUPPORT)
PREFILLED_SYRINGE | INTRAVENOUS | Status: DC | PRN
  Administered 2020-03-10 (×5): 80 ug via INTRAVENOUS

## 2020-03-10 MED ORDER — SODIUM CHLORIDE 0.9 % IV SOLN
INTRAVENOUS | Status: AC
  Filled 2020-03-10: qty 1.2

## 2020-03-10 MED ORDER — LIDOCAINE HCL (PF) 1 % IJ SOLN
INTRAMUSCULAR | Status: DC | PRN
  Administered 2020-03-10: 16 mL

## 2020-03-10 MED ORDER — FENTANYL CITRATE (PF) 100 MCG/2ML IJ SOLN: 25.0000 ug | INTRAMUSCULAR | Status: DC | PRN

## 2020-03-10 MED ORDER — ONDANSETRON HCL 4 MG/2ML IJ SOLN
INTRAMUSCULAR | Status: DC | PRN
  Administered 2020-03-10: 4 mg via INTRAVENOUS

## 2020-03-10 MED ORDER — HEPARIN SODIUM (PORCINE) 1000 UNIT/ML IJ SOLN
INTRAMUSCULAR | Status: DC | PRN
  Administered 2020-03-10: 5000 [IU] via INTRAVENOUS

## 2020-03-10 MED ORDER — ACETAMINOPHEN 160 MG/5ML PO SOLN: 1000.0000 mg | Freq: Once | ORAL | Status: DC | PRN

## 2020-03-10 SURGICAL SUPPLY — 42 items
ADH SKN CLS APL DERMABOND .7 (GAUZE/BANDAGES/DRESSINGS) ×1
ARMBAND PINK RESTRICT EXTREMIT (MISCELLANEOUS) ×3 IMPLANT
CANISTER SUCT 3000ML PPV (MISCELLANEOUS) ×2 IMPLANT
CANNULA VESSEL 3MM 2 BLNT TIP (CANNULA) ×2 IMPLANT
CLIP VESOCCLUDE MED 6/CT (CLIP) ×2 IMPLANT
CLIP VESOCCLUDE SM WIDE 6/CT (CLIP) ×2 IMPLANT
COVER PROBE W GEL 5X96 (DRAPES) IMPLANT
COVER WAND RF STERILE (DRAPES) ×1 IMPLANT
DECANTER SPIKE VIAL GLASS SM (MISCELLANEOUS) ×1 IMPLANT
DERMABOND ADVANCED (GAUZE/BANDAGES/DRESSINGS) ×1
DERMABOND ADVANCED .7 DNX12 (GAUZE/BANDAGES/DRESSINGS) ×1 IMPLANT
DRAIN PENROSE 1/4X12 LTX STRL (WOUND CARE) ×2 IMPLANT
DRAPE HALF SHEET 40X57 (DRAPES) ×1 IMPLANT
ELECT REM PT RETURN 9FT ADLT (ELECTROSURGICAL) ×2
ELECTRODE REM PT RTRN 9FT ADLT (ELECTROSURGICAL) ×1 IMPLANT
GLOVE BIO SURGEON STRL SZ 6.5 (GLOVE) ×2 IMPLANT
GLOVE BIO SURGEON STRL SZ7.5 (GLOVE) ×2 IMPLANT
GLOVE BIOGEL PI IND STRL 6.5 (GLOVE) IMPLANT
GLOVE BIOGEL PI IND STRL 7.0 (GLOVE) IMPLANT
GLOVE BIOGEL PI IND STRL 8 (GLOVE) IMPLANT
GLOVE BIOGEL PI INDICATOR 6.5 (GLOVE) ×2
GLOVE BIOGEL PI INDICATOR 7.0 (GLOVE) ×1
GLOVE BIOGEL PI INDICATOR 8 (GLOVE) ×1
GOWN STRL REUS W/ TWL LRG LVL3 (GOWN DISPOSABLE) ×3 IMPLANT
GOWN STRL REUS W/TWL LRG LVL3 (GOWN DISPOSABLE) ×8
KIT BASIN OR (CUSTOM PROCEDURE TRAY) ×2 IMPLANT
KIT TURNOVER KIT B (KITS) ×2 IMPLANT
LOOP VESSEL MINI RED (MISCELLANEOUS) IMPLANT
NS IRRIG 1000ML POUR BTL (IV SOLUTION) ×2 IMPLANT
PACK CV ACCESS (CUSTOM PROCEDURE TRAY) ×2 IMPLANT
PAD ARMBOARD 7.5X6 YLW CONV (MISCELLANEOUS) ×4 IMPLANT
SPONGE SURGIFOAM ABS GEL 100 (HEMOSTASIS) IMPLANT
SUT PROLENE 6 0 CC (SUTURE) ×2 IMPLANT
SUT PROLENE 7 0 BV 1 (SUTURE) IMPLANT
SUT SILK 3 0 (SUTURE) ×2
SUT SILK 3-0 18XBRD TIE 12 (SUTURE) IMPLANT
SUT VIC AB 3-0 SH 27 (SUTURE) ×2
SUT VIC AB 3-0 SH 27X BRD (SUTURE) ×1 IMPLANT
SUT VICRYL 4-0 PS2 18IN ABS (SUTURE) ×2 IMPLANT
TOWEL GREEN STERILE (TOWEL DISPOSABLE) ×2 IMPLANT
UNDERPAD 30X30 (UNDERPADS AND DIAPERS) ×2 IMPLANT
WATER STERILE IRR 1000ML POUR (IV SOLUTION) ×2 IMPLANT

## 2020-03-10 NOTE — Interval H&P Note (Signed)
History and Physical Interval Note:  03/10/2020 7:25 AM  Ryan Jimenez  has presented today for surgery, with the diagnosis of CHRONIC KIDNEY DISEASE STAGE 3.  The various methods of treatment have been discussed with the patient and family. After consideration of risks, benefits and other options for treatment, the patient has consented to  Procedure(s): RIGHT RADIOCEPHALIC ARTERIOVENOUS (AV) FISTULA CREATION (Right) as a surgical intervention.  The patient's history has been reviewed, patient examined, no change in status, stable for surgery.  I have reviewed the patient's chart and labs.  Questions were answered to the patient's satisfaction.     Ruta Hinds

## 2020-03-10 NOTE — Op Note (Signed)
Procedure: Right Brachial Cephalic AV fistula  Preop: CKD 4   Postop: CKD 4  Anesthesia: Local with IV sedation  Assistant: Paulo Fruit PA-c  Findings: 3.5 mm cephalic vein  Procedure: After obtaining informed consent, the patient was taken to the operating room.  After adequate sedation the right upper extremity was prepped and draped in usual sterile fashion.  A transverse incision was then made near the antecubital crease the right arm. The incision was carried into the subcutaneous tissues down to level of the cephalic vein. The cephalic vein was approximately 3.5 mm in diameter. It was of good quality. This was dissected free circumferentially and small side branches ligated and divided between silk ties or clips. Next the brachial artery was dissected free in the medial portion of the incision. The artery was  3-4 mm in diameter. The vessel loops were placed proximal and distal to the planned site of arteriotomy. The patient was given 5000 units of intravenous heparin. After appropriate circulation time, the vessel loops were used to control the artery. A longitudinal opening was made in the brachial artery.  The vein was ligated distally with a 2-0 silk tie. The vein was controlled proximally with a fine bulldog clamp. The vein was then swung over to the artery and sewn end of vein to side of artery using a running 7-0 Prolene suture. Just prior to completion of the anastomosis, everything was fore bled back bled and thoroughly flushed. The anastomosis was secured, vessel loops released, and there was a palpable thrill in the fistula immediately. After hemostasis was obtained, the subcutaneous tissues were reapproximated using a running 3-0 Vicryl suture. The skin was then closed with a 4 0 Vicryl subcuticular stitch. Dermabond was applied to the skin incision.  The patient had a palpable radial pulse at the end of the case.  Ruta Hinds, MD [facility]  [place] Office: [phone] Pager: [phone]

## 2020-03-10 NOTE — Anesthesia Procedure Notes (Signed)
Procedure Name: MAC Date/Time: 03/10/2020 7:40 AM Performed by: Janace Litten, CRNA Pre-anesthesia Checklist: Patient identified, Emergency Drugs available, Suction available and Patient being monitored Patient Re-evaluated:Patient Re-evaluated prior to induction Oxygen Delivery Method: Simple face mask

## 2020-03-10 NOTE — Transfer of Care (Signed)
Immediate Anesthesia Transfer of Care Note  Patient: Ryan Jimenez  Procedure(s) Performed: RIGHT BRACHIOCEPHALIC ARTERIOVENOUS (AV) FISTULA CREATION (Right )  Patient Location: PACU  Anesthesia Type:MAC  Level of Consciousness: awake, alert  and patient cooperative  Airway & Oxygen Therapy: Patient Spontanous Breathing  Post-op Assessment: Report given to RN and Post -op Vital signs reviewed and stable  Post vital signs: Reviewed and stable  Last Vitals:  Vitals Value Taken Time  BP    Temp    Pulse 73 03/10/20 0904  Resp 16 03/10/20 0904  SpO2 96 % 03/10/20 0904  Vitals shown include unvalidated device data.  Last Pain:  Vitals:   03/10/20 0558  TempSrc: Oral      Patients Stated Pain Goal: 3 (52/48/18 5909)  Complications: No apparent anesthesia complications

## 2020-03-10 NOTE — Discharge Instructions (Signed)
Monitored Anesthesia Care Anesthesia is a term that refers to techniques, procedures, and medicines that help a person stay safe and comfortable during a medical procedure. Monitored anesthesia care, or sedation, is one type of anesthesia. Your anesthesia specialist may recommend sedation if you will be having a procedure that does not require you to be unconscious, such as:  Cataract surgery.  A dental procedure.  A biopsy.  A colonoscopy. During the procedure, you may receive a medicine to help you relax (sedative). There are three levels of sedation:  Mild sedation. At this level, you may feel awake and relaxed. You will be able to follow directions.  Moderate sedation. At this level, you will be sleepy. You may not remember the procedure.  Deep sedation. At this level, you will be asleep. You will not remember the procedure. The more medicine you are given, the deeper your level of sedation will be. Depending on how you respond to the procedure, the anesthesia specialist may change your level of sedation or the type of anesthesia to fit your needs. An anesthesia specialist will monitor you closely during the procedure. Let your health care provider know about:  Any allergies you have.  All medicines you are taking, including vitamins, herbs, eye drops, creams, and over-the-counter medicines.  Any use of steroids (by mouth or as a cream).  Any problems you or family members have had with sedatives and anesthetic medicines.  Any blood disorders you have.  Any surgeries you have had.  Any medical conditions you have, such as sleep apnea.  Whether you are pregnant or may be pregnant.  Any use of cigarettes, alcohol, or street drugs. What are the risks? Generally, this is a safe procedure. However, problems may occur, including:  Getting too much medicine (oversedation).  Nausea.  Allergic reaction to medicines.  Trouble breathing. If this happens, a breathing tube may be  used to help with breathing. It will be removed when you are awake and breathing on your own.  Heart trouble.  Lung trouble. Before the procedure Staying hydrated Follow instructions from your health care provider about hydration, which may include:  Up to 2 hours before the procedure - you may continue to drink clear liquids, such as water, clear fruit juice, black coffee, and plain tea. Eating and drinking restrictions Follow instructions from your health care provider about eating and drinking, which may include:  8 hours before the procedure - stop eating heavy meals or foods such as meat, fried foods, or fatty foods.  6 hours before the procedure - stop eating light meals or foods, such as toast or cereal.  6 hours before the procedure - stop drinking milk or drinks that contain milk.  2 hours before the procedure - stop drinking clear liquids. Medicines Ask your health care provider about:  Changing or stopping your regular medicines. This is especially important if you are taking diabetes medicines or blood thinners.  Taking medicines such as aspirin and ibuprofen. These medicines can thin your blood. Do not take these medicines before your procedure if your health care provider instructs you not to. Tests and exams  You will have a physical exam.  You may have blood tests done to show: ? How well your kidneys and liver are working. ? How well your blood can clot. General instructions  Plan to have someone take you home from the hospital or clinic.  If you will be going home right after the procedure, plan to have someone with you  for 24 hours.  What happens during the procedure?  Your blood pressure, heart rate, breathing, level of pain and overall condition will be monitored.  An IV tube will be inserted into one of your veins.  Your anesthesia specialist will give you medicines as needed to keep you comfortable during the procedure. This may mean changing the  level of sedation.  The procedure will be performed. After the procedure  Your blood pressure, heart rate, breathing rate, and blood oxygen level will be monitored until the medicines you were given have worn off.  Do not drive for 24 hours if you received a sedative.  You may: ? Feel sleepy, clumsy, or nauseous. ? Feel forgetful about what happened after the procedure. ? Have a sore throat if you had a breathing tube during the procedure. ? Vomit. This information is not intended to replace advice given to you by your health care provider. Make sure you discuss any questions you have with your health care provider. Document Revised: 10/13/2017 Document Reviewed: 02/21/2016 Elsevier Patient Education  [place].   [facility]  Discharge Instructions  AV Fistula or Graft Surgery for Dialysis Access  Please refer to the following instructions for your post-procedure care. Your surgeon or physician assistant will discuss any changes with you.  Activity  You may drive the day following your surgery, if you are comfortable and no longer taking prescription pain medication. Resume full activity as the soreness in your incision resolves.  Bathing/Showering  You may shower after you go home. Keep your incision dry for 48 hours. Do not soak in a bathtub, hot tub, or swim until the incision heals completely. You may not shower if you have a hemodialysis catheter.  Incision Care  Clean your incision with mild soap and water after 48 hours. Pat the area dry with a clean towel. You do not need a bandage unless otherwise instructed. Do not apply any ointments or creams to your incision. You may have skin glue on your incision. Do not peel it off. It will come off on its own in about one week. Your arm may swell a bit after surgery. To reduce swelling use pillows to elevate your arm so it is above your heart. Your doctor will tell you if you need to  lightly wrap your arm with an ACE bandage.  Diet  Resume your normal diet. There are not special food restrictions following this procedure. In order to heal from your surgery, it is CRITICAL to get adequate nutrition. Your body requires vitamins, minerals, and protein. Vegetables are the best source of vitamins and minerals. Vegetables also provide the perfect balance of protein. Processed food has little nutritional value, so try to avoid this.  Medications  Resume taking all of your medications. If your incision is causing pain, you may take over-the counter pain relievers such as acetaminophen (Tylenol). If you were prescribed a stronger pain medication, please be aware these medications can cause nausea and constipation. Prevent nausea by taking the medication with a snack or meal. Avoid constipation by drinking plenty of fluids and eating foods with high amount of fiber, such as fruits, vegetables, and grains.  Do not take Tylenol if you are taking prescription pain medications.  Follow up Your surgeon may want to see you in the office following your access surgery. If so, this will be arranged at the time of your surgery.  Please call us immediately for any of the following conditions:  .  Increased pain, redness, drainage (pus) from your incision site . Fever of 101 degrees or higher . Severe or worsening pain at your incision site . Hand pain or numbness. .  Reduce your risk of vascular disease:  . Stop smoking. If you would like help, call [facility] at 1-800-QUIT-NOW 442-165-8703) or [place] at 443 213 7900  . Manage your cholesterol . Maintain a desired weight . Control your diabetes . Keep your blood pressure down  Dialysis  It will take several weeks to several months for your new dialysis access to be ready for use. Your surgeon will determine when it is okay to use it. Your nephrologist will continue to direct your dialysis. You can continue to use your Permcath  until your new access is ready for use.   03/10/2020 DAAIEL STARLIN [id number] [id number]  Surgeon(s): Elam Dutch, MD  Procedure(s): RIGHT BRACHIOCEPHALIC ARTERIOVENOUS (AV) FISTULA CREATION   May stick graft immediately   May stick graft on designated area only:   X Do not stick for 12 weeks    If you have any questions, please call the office at [phone].

## 2020-03-11 ENCOUNTER — Emergency Department (HOSPITAL_COMMUNITY)
Admission: EM | Admit: 2020-03-11 | Discharge: 2020-03-11 | Disposition: A | Discharge status: Home / Self Care | Payer: Medicare Other | Attending: Emergency Medicine | Admitting: Emergency Medicine

## 2020-03-11 ENCOUNTER — Emergency Department (HOSPITAL_COMMUNITY): Payer: Medicare Other

## 2020-03-11 ENCOUNTER — Other Ambulatory Visit: Payer: Self-pay

## 2020-03-11 ENCOUNTER — Encounter (HOSPITAL_COMMUNITY): Payer: Self-pay | Admitting: Emergency Medicine

## 2020-03-11 DIAGNOSIS — Z5321 Procedure and treatment not carried out due to patient leaving prior to being seen by health care provider: Secondary | ICD-10-CM | POA: Insufficient documentation

## 2020-03-11 DIAGNOSIS — R0602 Shortness of breath: Secondary | ICD-10-CM | POA: Diagnosis not present

## 2020-03-11 NOTE — ED Notes (Signed)
Pt stated that "I can not wait anymore im leaving." Pt has left.

## 2020-03-11 NOTE — ED Triage Notes (Signed)
Pt st's he had surg this am to place dialysis shunt.  Pt st's tonight when he lays down he feels like he has something in his throat  Pt speaking in full sentences

## 2020-03-14 NOTE — Anesthesia Postprocedure Evaluation (Signed)
Anesthesia Post Note  Patient: Ryan Jimenez  Procedure(s) Performed: RIGHT BRACHIOCEPHALIC ARTERIOVENOUS (AV) FISTULA CREATION (Right )     Patient location during evaluation: PACU Anesthesia Type: MAC Level of consciousness: patient cooperative and awake Pain management: pain level controlled Vital Signs Assessment: post-procedure vital signs reviewed and stable Respiratory status: spontaneous breathing, nonlabored ventilation, respiratory function stable and patient connected to nasal cannula oxygen Cardiovascular status: stable and blood pressure returned to baseline Postop Assessment: no apparent nausea or vomiting Anesthetic complications: no    Last Vitals:  Vitals:   03/10/20 0920 03/10/20 0930  BP: 135/77 140/65  Pulse: 83 83  Resp: (!) 21 (!) 21  Temp:  (!) 36.3 C  SpO2: 95% 94%    Last Pain:  Vitals:   03/10/20 0930  TempSrc:   PainSc: 0-No pain                 Ryan Jimenez

## 2020-04-20 ENCOUNTER — Encounter (HOSPITAL_COMMUNITY): Payer: Self-pay | Admitting: Emergency Medicine

## 2020-04-20 ENCOUNTER — Other Ambulatory Visit: Payer: Self-pay | Admitting: *Deleted

## 2020-04-20 ENCOUNTER — Ambulatory Visit (HOSPITAL_COMMUNITY)
Admission: EM | Admit: 2020-04-20 | Discharge: 2020-04-20 | Disposition: A | Discharge status: Home / Self Care | Payer: Medicare Other | Attending: Family Medicine | Admitting: Family Medicine

## 2020-04-20 ENCOUNTER — Other Ambulatory Visit: Payer: Self-pay

## 2020-04-20 ENCOUNTER — Ambulatory Visit (INDEPENDENT_AMBULATORY_CARE_PROVIDER_SITE_OTHER): Payer: Medicare Other

## 2020-04-20 DIAGNOSIS — R062 Wheezing: Secondary | ICD-10-CM

## 2020-04-20 DIAGNOSIS — R05 Cough: Secondary | ICD-10-CM

## 2020-04-20 DIAGNOSIS — R0602 Shortness of breath: Secondary | ICD-10-CM | POA: Diagnosis not present

## 2020-04-20 DIAGNOSIS — N185 Chronic kidney disease, stage 5: Secondary | ICD-10-CM

## 2020-04-20 MED ORDER — PREDNISONE 20 MG PO TABS: 20.0000 mg | ORAL_TABLET | Freq: Two times a day (BID) | ORAL | 0 refills | Status: DC

## 2020-04-20 MED ORDER — ALBUTEROL SULFATE (2.5 MG/3ML) 0.083% IN NEBU
2.5000 mg | INHALATION_SOLUTION | Freq: Four times a day (QID) | RESPIRATORY_TRACT | 0 refills | Status: DC | PRN
Start: 2020-04-20 — End: 2020-05-20

## 2020-04-20 NOTE — Discharge Instructions (Signed)
Take prednisone 2 times a day Watch the salt and fluids in the diet Use the nebulizer instead of the inhaler for shortness of breath and wheezing Call your doctor if not better in a couple of days

## 2020-04-20 NOTE — ED Triage Notes (Signed)
Pt here for cough and SOB x months; pt sts hx of bronchitis; pt sts has improved but worse at night and has to sleep elevated at times; denies

## 2020-04-20 NOTE — ED Provider Notes (Signed)
[place]    CSN: [id number] Arrival date & time: 04/20/20  1732      History   Chief Complaint Chief Complaint  Patient presents with  . Cough  . Shortness of Breath    HPI Ryan Jimenez is a 86 y.o. male.   HPI  Very pleasant 86 year old gentleman.  He is brought in by his daughter for progressive cough and shortness of breath.  Its been going on for over a month.  They state the cough and shortness of breath is worse at night.  He is in sleep sitting up.  The cough is interrupting his sleep.  The daughter states that he had bronchitis about a month ago.  That his family doctor has called in 2 courses of antibiotics.  He gets slightly improved, and then starts coughing again.  He denies any underlying asthma or COPD.  He was given albuterol inhaler but they state "it does not work".  He states he does feel like he is wheezing.  He does not have any chest pain.  He states he always has swelling in his ankles and has problems with fluid balance.  He does have end-stage renal disease and is being prepared for dialysis with a recent shunt placement.  His last creatinine, performed a little over a month ago, was 4.5.  Past Medical History:  Diagnosis Date  . Anemia   . Arthritis   . Bronchitis   . CKD (chronic kidney disease)   . Diabetes mellitus   . Dysrhythmia   . Glomerulonephritis   . Gout   . History of pneumonia   . History of right bundle branch block (RBBB)   . Hypercholesterolemia   . Hypertension   . Near syncope 10/2011; 11/2011  . Pancytopenia   . Renal disorder   . Shortness of breath on exertion   . Type 2 diabetes mellitus [facility])     Patient Active Problem List   Diagnosis Date Noted  . CKD (chronic kidney disease) stage 3, GFR 30-59 ml/min 04/04/2015  . Chest pain 04/03/2015  . 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 CVA (cerebral infarction) 04/23/2013  . Hidradenitis suppurativa of right axilla 04/05/2012  . Abscess of  axillary region 04/04/2012  . Neutropenia 04/04/2012  . Pancytopenia due to chemotherapy ([place]) 04/04/2012  . Anemia 04/04/2012  . Generalized weakness 04/04/2012  . Physical deconditioning 04/04/2012  . Benign positional vertigo 11/28/2011  . Hypertension 11/23/2011  . Renal failure 11/23/2011  . Type 2 diabetes mellitus ([place]) 11/23/2011  . Hypercholesterolemia     Past Surgical History:  Procedure Laterality Date  . AV FISTULA PLACEMENT Right 03/10/2020   Procedure: RIGHT BRACHIOCEPHALIC ARTERIOVENOUS (AV) FISTULA CREATION;  Surgeon: Elam Dutch, MD;  Location: [place];  Service: Vascular;  Laterality: Right;  . CATARACT EXTRACTION W/ INTRAOCULAR LENS IMPLANT  ~ 2009   right  . HYDROCELE EXCISION         Home Medications    Prior to Admission medications   Medication Sig Start Date End Date Taking? Authorizing Provider  albuterol (PROVENTIL) (2.5 MG/3ML) 0.083% nebulizer solution Take 3 mLs (2.5 mg total) by nebulization every 6 (six) hours as needed for wheezing or shortness of breath. 04/20/20   Raylene Everts, MD  allopurinol (ZYLOPRIM) 100 MG tablet Take 100 mg by mouth daily.    [provider]  amLODipine (NORVASC) 10 MG tablet Take 10 mg by mouth daily.    [provider]  aspirin  EC 81 MG tablet Take 81 mg by mouth daily.    [provider]  carvedilol (COREG) 12.5 MG tablet Take 1 tablet (12.5 mg total) by mouth 2 (two) times daily with a meal. Patient taking differently: Take 12.5 mg by mouth 2 (two) times daily with a meal. 10am and 10pm 05/02/13   Angiulli, Lavon Paganini, PA-C  Cholecalciferol (VITAMIN D) 125 MCG (5000 UT) CAPS Take 5,000 Units by mouth daily.    [provider]  colchicine 0.6 MG tablet Take 0.6 mg by mouth daily as needed (gout).     [provider]  dutasteride (AVODART) 0.5 MG capsule Take 0.5 mg by mouth daily.    [provider]  fluticasone (FLONASE) 50 MCG/ACT nasal spray Place 2 sprays  into both nostrils daily. Patient taking differently: Place 2 sprays into both nostrils daily as needed for allergies.  05/29/18   Tasia Catchings, Amy V, PA-C  insulin aspart (NOVOLOG) 100 UNIT/ML injection Inject 6-8 Units into the skin 3 (three) times daily as needed for high blood sugar.    [provider]  LEVEMIR FLEXTOUCH 100 UNIT/ML Pen Inject 45-60 Units into the skin at bedtime as needed (high blood sugar). Per sliding scale 01/30/15   [provider]  losartan (COZAAR) 50 MG tablet Take 50 mg by mouth 2 (two) times daily.    [provider]  Multiple Vitamins-Minerals (MULTIVITAMIN WITH MINERALS) tablet Take 1 tablet by mouth daily.    [provider]  oxyCODONE-acetaminophen (PERCOCET) 7.5-325 MG tablet Take 1 tablet by mouth every 4 (four) hours as needed for severe pain. 03/10/20 03/10/21  Baglia, Corrina, PA-C  predniSONE (DELTASONE) 20 MG tablet Take 1 tablet (20 mg total) by mouth 2 (two) times daily with a meal. 04/20/20   Raylene Everts, MD  rosuvastatin (CRESTOR) 10 MG tablet Take 1 tablet (10 mg total) by mouth at bedtime. 05/02/13   Angiulli, Lavon Paganini, PA-C  tamsulosin (FLOMAX) 0.4 MG CAPS Take 1 capsule (0.4 mg total) by mouth at bedtime. Patient taking differently: Take 0.8 mg by mouth at bedtime.  05/02/13   Angiulli, Lavon Paganini, PA-C  Tetrahydroz-Dextran-PEG-Povid (EYE DROPS ADVANCED RELIEF OP) Place 1 drop into both eyes daily as needed (dry eyes).     [provider]    Family History Family History  Problem Relation Age of Onset  . Hypertension Mother   . Heart disease Mother   . Diabetes Sister     Social History Social History   Tobacco Use  . Smoking status: Former Smoker    Packs/day: 1.00    Years: 40.00    Pack years: 40.00    Types: Cigarettes  . Smokeless tobacco: Never Used  Substance Use Topics  . Alcohol use: No    Comment: 11/23/11 "couple ounces q month maybe"  . Drug use: No     Allergies   Clonidine  derivatives   Review of Systems Review of Systems  Respiratory: Positive for shortness of breath.        Patient reports wheezing and shortness of breath mostly at night.  Query PND     Physical Exam Triage Vital Signs ED Triage Vitals  Enc Vitals Group     BP 04/20/20 1832 (!) 141/50     Pulse Rate 04/20/20 1832 77     Resp 04/20/20 1832 18     Temp 04/20/20 1832 98.5 F (36.9 C)     Temp Source 04/20/20 1832 Oral  SpO2 04/20/20 1832 98 %     Weight --      Height --      Head Circumference --      Peak Flow --      Pain Score 04/20/20 1833 0     Pain Loc --      Pain Edu? --      Excl. in [place]? --    No data found.  Updated Vital Signs BP (!) 141/50 (BP Location: Right Arm)   Pulse 77   Temp 98.5 F (36.9 C) (Oral)   Resp 18   SpO2 98%       Physical Exam Constitutional:      General: He is not in acute distress.    Appearance: He is well-developed.     Comments: Overweight.  In wheelchair  HENT:     Head: Normocephalic and atraumatic.  Eyes:     Conjunctiva/sclera: Conjunctivae normal.     Pupils: Pupils are equal, round, and reactive to light.  Cardiovascular:     Rate and Rhythm: Normal rate and regular rhythm.     Heart sounds: Normal heart sounds.  Pulmonary:     Effort: Pulmonary effort is normal. No respiratory distress.     Comments: Decreased breath sounds throughout Musculoskeletal:        General: Normal range of motion.     Cervical back: Normal range of motion.     Right lower leg: Edema present.     Left lower leg: Edema present.  Skin:    General: Skin is warm and dry.  Neurological:     Mental Status: He is alert.  Psychiatric:        Mood and Affect: Mood normal.        Behavior: Behavior normal.      UC Treatments / Results  Labs (all labs ordered are listed, but only abnormal results are displayed) Labs Reviewed - No data to display  EKG-sinus rhythm.  Right bundle branch block.  Since prior tracing no significant  change.   Radiology DG Chest 2 View  Result Date: 04/20/2020 CLINICAL DATA:  Shortness of breath with cough and wheezing EXAM: CHEST - 2 VIEW COMPARISON:  March 11, 2020 FINDINGS: A small portion of the lateral left base is not evident. No edema or consolidation evident in the visualized lungs. Heart size and pulmonary vascularity are normal. No adenopathy. There is aortic atherosclerosis. There is degenerative change in the thoracic spine. IMPRESSION: A small portion of the lateral left base is not imaged. Visualized lungs clear. Cardiac silhouette within normal limits. Aortic Atherosclerosis (ICD10-I70.0). Electronically Signed   By: Lowella Grip III M.D.   On: 04/20/2020 19:47    Procedures Procedures (including critical care time)  Medications Ordered in UC Medications - No data to display  Initial Impression / Assessment and Plan / UC Course  I have reviewed the triage vital signs and the nursing notes.  Pertinent labs & imaging results that were available during my care of the patient were reviewed by me and considered in my medical decision making (see chart for details).     Patient is to follow-up with his primary care I believe he has pulmonary etiology for his shortness of breath and cough His EKG is unchanged, does not appear to have fluid overload on chest x-ray Final Clinical Impressions(s) / UC Diagnoses   Final diagnoses:  Shortness of breath     Discharge Instructions     Take prednisone  2 times a day Watch the salt and fluids in the diet Use the nebulizer instead of the inhaler for shortness of breath and wheezing Call your doctor if not better in a couple of days    ED Prescriptions    Medication Sig Dispense Auth. Provider   albuterol (PROVENTIL) (2.5 MG/3ML) 0.083% nebulizer solution Take 3 mLs (2.5 mg total) by nebulization every 6 (six) hours as needed for wheezing or shortness of breath. 75 mL Raylene Everts, MD   predniSONE (DELTASONE) 20  MG tablet Take 1 tablet (20 mg total) by mouth 2 (two) times daily with a meal. 10 tablet Raylene Everts, MD     PDMP not reviewed this encounter.   Raylene Everts, MD 04/20/20 2100

## 2020-04-22 ENCOUNTER — Telehealth (HOSPITAL_COMMUNITY): Payer: Self-pay

## 2020-04-22 NOTE — Telephone Encounter (Signed)

## 2020-04-23 ENCOUNTER — Ambulatory Visit (INDEPENDENT_AMBULATORY_CARE_PROVIDER_SITE_OTHER): Payer: Self-pay | Admitting: Physician Assistant

## 2020-04-23 ENCOUNTER — Ambulatory Visit (HOSPITAL_COMMUNITY)
Admission: RE | Admit: 2020-04-23 | Discharge: 2020-04-23 | Disposition: A | Discharge status: Home / Self Care | Payer: Medicare Other | Source: Ambulatory Visit | Attending: Vascular Surgery | Admitting: Vascular Surgery

## 2020-04-23 ENCOUNTER — Other Ambulatory Visit: Payer: Self-pay

## 2020-04-23 VITALS — BP 153/69 | HR 71 | Resp 20 | Ht 69.0 in | Wt 259.4 lb

## 2020-04-23 DIAGNOSIS — N185 Chronic kidney disease, stage 5: Secondary | ICD-10-CM | POA: Insufficient documentation

## 2020-04-23 NOTE — Progress Notes (Signed)
POST OPERATIVE OFFICE NOTE    CC:  F/u for surgery  HPI:  This is a 86 y.o. male who is s/p right BC AV fistula creation  On 03/10/2020 by Dr. Oneida Alar.  Pt returns today for follow up.   He denise pain, loss of sensation or loss or motor in the left UE.  Allergies  Allergen Reactions  . Clonidine Derivatives Other (See Comments)    Not known    Current Outpatient Medications  Medication Sig Dispense Refill  . albuterol (PROVENTIL) (2.5 MG/3ML) 0.083% nebulizer solution Take 3 mLs (2.5 mg total) by nebulization every 6 (six) hours as needed for wheezing or shortness of breath. 75 mL 0  . allopurinol (ZYLOPRIM) 100 MG tablet Take 100 mg by mouth daily.    [name] amLODipine (NORVASC) 10 MG tablet Take 10 mg by mouth daily.    [name] aspirin EC 81 MG tablet Take 81 mg by mouth daily.    . benzonatate (TESSALON) 100 MG capsule Take 200 mg by mouth every 8 (eight) hours as needed.    . carvedilol (COREG) 12.5 MG tablet Take 1 tablet (12.5 mg total) by mouth 2 (two) times daily with a meal. (Patient taking differently: Take 12.5 mg by mouth 2 (two) times daily with a meal. 10am and 10pm) 60 tablet 1  . Cholecalciferol (VITAMIN D) 125 MCG (5000 UT) CAPS Take 5,000 Units by mouth daily.    . colchicine 0.6 MG tablet Take 0.6 mg by mouth daily as needed (gout).     [name] dutasteride (AVODART) 0.5 MG capsule Take 0.5 mg by mouth daily.    . fluticasone (FLONASE) 50 MCG/ACT nasal spray Place 2 sprays into both nostrils daily. (Patient taking differently: Place 2 sprays into both nostrils daily as needed for allergies. ) 1 g 0  . hydrALAZINE (APRESOLINE) 25 MG tablet Take 25 mg by mouth 2 (two) times daily.    . insulin aspart (NOVOLOG) 100 UNIT/ML injection Inject 6-8 Units into the skin 3 (three) times daily as needed for high blood sugar.    [name] LEVEMIR FLEXTOUCH 100 UNIT/ML Pen Inject 45-60 Units into the skin at bedtime as needed (high blood sugar). Per sliding scale  2  . losartan (COZAAR) 50 MG tablet Take  50 mg by mouth 2 (two) times daily.    . Multiple Vitamins-Minerals (MULTIVITAMIN WITH MINERALS) tablet Take 1 tablet by mouth daily.    [name] oxyCODONE-acetaminophen (PERCOCET) 7.5-325 MG tablet Take 1 tablet by mouth every 4 (four) hours as needed for severe pain. 6 tablet 0  . predniSONE (DELTASONE) 20 MG tablet Take 1 tablet (20 mg total) by mouth 2 (two) times daily with a meal. 10 tablet 0  . rosuvastatin (CRESTOR) 10 MG tablet Take 1 tablet (10 mg total) by mouth at bedtime. 30 tablet 1  . tamsulosin (FLOMAX) 0.4 MG CAPS Take 1 capsule (0.4 mg total) by mouth at bedtime. (Patient taking differently: Take 0.8 mg by mouth at bedtime. ) 30 capsule 1  . Tetrahydroz-Dextran-PEG-Povid (EYE DROPS ADVANCED RELIEF OP) Place 1 drop into both eyes daily as needed (dry eyes).      No current facility-administered medications for this visit.     ROS:  See HPI  Physical Exam:    Findings:  +--------------------+----------+-----------------+--------+  AVF         PSV (cm/s)Flow Vol (mL/min)Comments  +--------------------+----------+-----------------+--------+  Native artery inflow  357     1742          +--------------------+----------+-----------------+--------+  AVF Anastomosis     777                 +--------------------+----------+-----------------+--------+     +------------+----------+-------------+----------+----------------+  OUTFLOW VEINPSV (cm/s)Diameter (cm)Depth (cm)  Describe    +------------+----------+-------------+----------+----------------+  Shoulder    248    0.51     0.78  competing branch  +------------+----------+-------------+----------+----------------+  Prox UA     191    0.49     0.58  competing branch  +------------+----------+-------------+----------+----------------+  Mid UA     606    0.57     0.46  competing branch    +------------+----------+-------------+----------+----------------+  Dist UA     208    0.48     0.40  competing branch  +------------+----------+-------------+----------+----------------+  AC Fossa    393    0.75     0.35  competing branch  +------------+----------+-------------+----------+----------------+     Summary:  Patent arteriovenous fistula.   Incision:  Well healed  Extremities:  Palpable thrill at anastomosis, the fistula is not visible up the biceps area of the arm.  Distal palpable radial pulse, sensation and motor intact.  Lungs: non labored breathing   Assessment/Plan:  This is a 86 y.o. male who is s/p:Patent BC AV fistula.  The fistula has not matured enough and has multiple competing branches.  I have recommended ligation of competing branches and superficialization of the fistula.   He and his daughter are in agreement of this plan of care.  He is not currently on HD.   Roxy Horseman PA-C [facility] [phone]  Clinic MD:  Oneida Alar

## 2020-04-29 ENCOUNTER — Other Ambulatory Visit: Payer: Self-pay

## 2020-05-04 ENCOUNTER — Other Ambulatory Visit (HOSPITAL_COMMUNITY)
Admission: RE | Admit: 2020-05-04 | Discharge: 2020-05-04 | Disposition: A | Discharge status: Home / Self Care | Payer: Medicare Other | Source: Ambulatory Visit | Attending: Vascular Surgery | Admitting: Vascular Surgery

## 2020-05-04 ENCOUNTER — Other Ambulatory Visit (HOSPITAL_COMMUNITY): Payer: Medicare Other

## 2020-05-04 DIAGNOSIS — Z01812 Encounter for preprocedural laboratory examination: Secondary | ICD-10-CM | POA: Diagnosis present

## 2020-05-04 DIAGNOSIS — Z20822 Contact with and (suspected) exposure to covid-19: Secondary | ICD-10-CM | POA: Diagnosis not present

## 2020-05-04 LAB — SARS CORONAVIRUS 2 (TAT 6-24 HRS): SARS Coronavirus 2: NEGATIVE

## 2020-05-05 ENCOUNTER — Other Ambulatory Visit: Payer: Self-pay

## 2020-05-05 ENCOUNTER — Encounter (HOSPITAL_COMMUNITY): Payer: Self-pay | Admitting: Vascular Surgery

## 2020-05-05 NOTE — Progress Notes (Signed)
Spoke with pt's daughter, Ryan Jimenez for pre-op call. She states pt does not have a cardiac history. Pt is treated for HTN, Diabetes and ESRD (pt is not on Dialysis yet). Ryan Jimenez states pt had his A1C done today and it was 7.5. She states his fasting blood sugar can be in the mid 100's to 200. Instructed Ryan Jimenez to have pt take 1/2 of his regular dose of Levemir Wednesday evening. Instructed her to have pt check his blood sugar when he gets up Thursday AM and every 2 hours until he leaves for the hospital. If blood sugar is >220 take 1/2 of usual correction dose of Novolog insulin. If blood sugar is 70 or below, treat with 1/2 cup of clear juice (apple or cranberry) and recheck blood sugar 15 minutes after drinking juice. If blood sugar continues to be 70 or below, call the Short Stay department and ask to speak to a nurse. Ryan Jimenez voiced understanding.   Covid test was done 05/04/20 and it is negative. Ryan Jimenez states pt has been in quarantine since the test was done except for going to doctor's appt today. She states he wore his mask, washed hands and social distanced. She states pt will continue to be in quarantine until he comes to the hospital on Thursday.

## 2020-05-06 NOTE — Progress Notes (Signed)
Pt daughter, Gabriel Cirri, requested that nurse give pre-op insulin instructions again. Both pt and daughter were listening in on the phone call together. Pt made aware to take 50% of Levemir insulin tonight. Pt made aware to check CBG every 2 hours prior to arrival to hospital on DOS. Pt made aware to treat a CBG < 70 with 4 ounces of apple or cranberry juice, wait 15 minutes after intervention to recheck CBG, if CBG remains < 70, call Short Stay unit to speak with a nurse. Pt made aware to take 1/2 of correction dose ( SS) for CBG > 220. Both pt and daughter verbalized understanding of all pre-op instructions.

## 2020-05-07 ENCOUNTER — Ambulatory Visit (HOSPITAL_COMMUNITY)
Admission: RE | Admit: 2020-05-07 | Discharge: 2020-05-07 | Disposition: A | Discharge status: Home / Self Care | Payer: Medicare Other | Attending: Vascular Surgery | Admitting: Vascular Surgery

## 2020-05-07 HISTORY — DX: Pneumonia, unspecified organism: J18.9

## 2020-05-07 HISTORY — DX: Dyspnea, unspecified: R06.00

## 2020-05-07 SURGERY — LIGATION OF COMPETING BRANCHES OF ARTERIOVENOUS FISTULA
Anesthesia: Choice | Site: Arm Upper | Laterality: Right

## 2020-05-08 ENCOUNTER — Other Ambulatory Visit (HOSPITAL_COMMUNITY)
Admission: RE | Admit: 2020-05-08 | Discharge: 2020-05-08 | Disposition: A | Discharge status: Home / Self Care | Payer: Medicare Other | Source: Ambulatory Visit | Attending: Vascular Surgery | Admitting: Vascular Surgery

## 2020-05-08 DIAGNOSIS — Z20822 Contact with and (suspected) exposure to covid-19: Secondary | ICD-10-CM | POA: Insufficient documentation

## 2020-05-08 DIAGNOSIS — Z01812 Encounter for preprocedural laboratory examination: Secondary | ICD-10-CM | POA: Insufficient documentation

## 2020-05-08 LAB — SARS CORONAVIRUS 2 (TAT 6-24 HRS): SARS Coronavirus 2: NEGATIVE

## 2020-05-08 NOTE — Progress Notes (Addendum)
Patient's surgery on Thursday, 05/07/20 was cancelled due to an emergency.  Spoke with patient's daughter Justus Memory [phone] with new instructions for DOS on Monday,05/11/20.    EKG 04/20/20 CxR 04/20/20 (2V) Echo 04/23/13 Stress - approx 2014 was negative

## 2020-05-08 NOTE — Progress Notes (Signed)
Anesthesia Chart Review: Kathleene Hazel   Case: [id number] Date/Time: 05/11/20 0715   Procedure: BRANCH LIGATION AND SUPERFICIALIZATION OF RIGHT UPPER ARM FISTULA (Right )   Anesthesia type: Choice   Pre-op diagnosis: CHRONIC KIDNEY DISEASE   Location: [facility] OR ROOM 12 / [facility]   Surgeons: Marty Heck, MD      DISCUSSION: Patient is a 86 year old male scheduled for the above procedure. Surgery was initially scheduled for 05/07/20, but was cancelled -- I believe due to surgeon being called to an emergency surgery.   Patient is s/p creation of right brachial-cephalic AVF on 0/45/40. He was seen in follow-up on 04/23/20 and the AVF had not matured enough and had multiple competing branches, so the above procedure recommended. He is not currently on hemodialysis.  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   Reportedly A1c on 05/05/20 was 7.5%. He will get ISTAT labs on the day of surgery.   05/08/20 presurgical COVID-19 test is in process. Anesthesia team to evaluate on the day of surgery.    VS: There were no vitals taken for this visit. Wt Readings from Last 3 Encounters:  04/23/20 117.7 kg  03/11/20 112.5 kg  03/10/20 112 kg   BP Readings from Last 3 Encounters:  04/23/20 (!) 153/69  04/20/20 (!) 141/50  03/11/20 (!) 146/60   Pulse Readings from Last 3 Encounters:  04/23/20 71  04/20/20 77  03/11/20 62    PROVIDERS: Nolene Ebbs, MD is PCP  Elmarie Shiley, MD is nephrologist   LABS: For day of surgery. As of 03/10/20, H/H 11.9/35.0, Cr 4.50, glucose 133.    IMAGES: CXR 04/20/20: FINDINGS: A small portion of the lateral left base is not evident. No edema or consolidation evident in the visualized lungs. Heart size and pulmonary vascularity are normal. No adenopathy.  There is aortic atherosclerosis. There is degenerative change in the thoracic spine. IMPRESSION: A small portion of the lateral left base is not imaged. Visualized lungs clear. Cardiac silhouette within normal limits. Aortic Atherosclerosis (ICD10-I70.0).   EKG: 04/20/20: Normal sinus rhythm Right bundle branch block Abnormal ECG No significant change since last tracing Confirmed by Fransico Him ([id number]) on 04/21/2020 7:02:06 PM     CV: TTE 04/23/13: Left ventricle: The cavity size was normal. Systolic  function was normal. The estimated ejection fraction was in  the range of 50% to 55%. Although no diagnostic regional  wall motion abnormality was identified, this possibility  cannot be completely excluded on the basis of this study.  Doppler parameters are consistent with abnormal left  ventricular relaxation (grade 1 diastolic dysfunction).  Transthoracic echocardiography. M-mode, complete    Past Medical History:  Diagnosis Date  . Anemia   . Arthritis   . Bronchitis   . CKD (chronic kidney disease)    Stage 3/4  . Diabetes mellitus   . Dyspnea   . Dysrhythmia   . Glomerulonephritis   . Gout   . History of pneumonia   . History of right bundle branch block (RBBB)   . Hypercholesterolemia   . Hypertension   . Near syncope 10/2011; 11/2011  . Pancytopenia   . Pneumonia   . Renal disorder   . Shortness of breath on exertion   . Type 2 diabetes mellitus ([place])     Past Surgical History:  Procedure Laterality Date  .  AV FISTULA PLACEMENT Right 03/10/2020   Procedure: RIGHT BRACHIOCEPHALIC ARTERIOVENOUS (AV) FISTULA CREATION;  Surgeon: Elam Dutch, MD;  Location: [facility];  Service: Vascular;  Laterality: Right;  . CATARACT EXTRACTION W/ INTRAOCULAR LENS IMPLANT  ~ 2009   right  . HYDROCELE EXCISION      MEDICATIONS: No current facility-administered medications for this encounter.   [name] albuterol (PROVENTIL) (2.5 MG/3ML) 0.083% nebulizer solution  . albuterol  (VENTOLIN HFA) 108 (90 Base) MCG/ACT inhaler  . allopurinol (ZYLOPRIM) 100 MG tablet  . amLODipine (NORVASC) 10 MG tablet  . aspirin EC 81 MG tablet  . benzonatate (TESSALON) 100 MG capsule  . carvedilol (COREG) 12.5 MG tablet  . cetirizine (ZYRTEC) 10 MG tablet  . colchicine 0.6 MG tablet  . dutasteride (AVODART) 0.5 MG capsule  . fluticasone (FLONASE) 50 MCG/ACT nasal spray  . furosemide (LASIX) 40 MG tablet  . hydrALAZINE (APRESOLINE) 25 MG tablet  . insulin aspart (NOVOLOG) 100 UNIT/ML injection  . LEVEMIR FLEXTOUCH 100 UNIT/ML Pen  . losartan (COZAAR) 50 MG tablet  . mirtazapine (REMERON) 7.5 MG tablet  . Multiple Vitamin (MULTIVITAMIN WITH MINERALS) TABS tablet  . NOREL AD 4-10-325 MG TABS  . rosuvastatin (CRESTOR) 10 MG tablet  . tamsulosin (FLOMAX) 0.4 MG CAPS  . Tetrahydroz-Dextran-PEG-Povid (EYE DROPS ADVANCED RELIEF OP)  . oxyCODONE-acetaminophen (PERCOCET) 7.5-325 MG tablet  . predniSONE (DELTASONE) 20 MG tablet  These medication are currently listed as not taking: Percocet, prednisone 20 mg.    Myra Gianotti, PA-C Surgical Short Stay/Anesthesiology [facility] Phone [phone] [facility] Phone [phone] 05/08/2020 4:15 PM

## 2020-05-08 NOTE — Anesthesia Preprocedure Evaluation (Addendum)
Anesthesia Evaluation  Patient identified by MRN, date of birth, ID band Patient awake    Reviewed: Allergy & Precautions, NPO status , Patient's Chart, lab work & pertinent test results  Airway Mallampati: II  TM Distance: >3 FB Neck ROM: Full    Dental no notable dental hx. (+) Edentulous Upper, Edentulous Lower, Dental Advisory Given   Pulmonary shortness of breath and with exertion, former smoker,  40 pack year history    Pulmonary exam normal breath sounds clear to auscultation       Cardiovascular Exercise Tolerance: Poor hypertension, Pt. on medications and Pt. on home beta blockers +CHF  Normal cardiovascular exam Rhythm:Regular Rate:Normal  Last echo 2014: Left ventricle: The cavity size was normal. Systolic  function was normal. The estimated ejection fraction was in  the range of 50% to 55%. Although no diagnostic regional  wall motion abnormality was identified, this possibility  cannot be completely excluded on the basis of this study.  Doppler parameters are consistent with abnormal left  ventricular relaxation (grade 1 diastolic dysfunction).    EKG 04/2020: pre-existing RBBB   Neuro/Psych negative neurological ROS  negative psych ROS   GI/Hepatic negative GI ROS, Neg liver ROS,   Endo/Other  diabetes, Well Controlled, Type 2, Insulin DependentObesity BMI 38 No recent A1c on file, per patient last one was 7.5 this year  Renal/GU CRFRenal diseases/p creation of right brachial-cephalic AVF on 07/30/37. He was seen in follow-up on 04/23/20 and the AVF had not matured enough and had multiple competing branches, so the above procedure recommended. He is not currently on hemodialysis.   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, s/p short-term HD and plasmapheresis  K 4.2  negative genitourinary   Musculoskeletal  (+) Arthritis , Osteoarthritis,    Abdominal   Peds  Hematology  (+) Blood  dyscrasia, anemia , hct 31   Anesthesia Other Findings   Reproductive/Obstetrics negative OB ROS                         Anesthesia Physical Anesthesia Plan  ASA: IV  Anesthesia Plan: MAC and Regional   Post-op Pain Management:    Induction:   PONV Risk Score and Plan: 2 and Propofol infusion and TIVA  Airway Management Planned: Natural Airway and Simple Face Mask  Additional Equipment: None  Intra-op Plan:   Post-operative Plan:   Informed Consent: I have reviewed the patients History and Physical, chart, labs and discussed the procedure including the risks, benefits and alternatives for the proposed anesthesia with the patient or authorized representative who has indicated his/her understanding and acceptance.       Plan Discussed with: CRNA  Anesthesia Plan Comments: ( )      Anesthesia Quick Evaluation

## 2020-05-11 ENCOUNTER — Ambulatory Visit (HOSPITAL_COMMUNITY): Payer: Medicare Other | Admitting: Vascular Surgery

## 2020-05-11 ENCOUNTER — Encounter (HOSPITAL_COMMUNITY): Payer: Self-pay | Admitting: Vascular Surgery

## 2020-05-11 ENCOUNTER — Encounter (HOSPITAL_COMMUNITY)
Admission: RE | Disposition: A | Discharge status: Home / Self Care | Payer: Self-pay | Source: Home / Self Care | Attending: Vascular Surgery

## 2020-05-11 ENCOUNTER — Ambulatory Visit (HOSPITAL_COMMUNITY)
Admission: RE | Admit: 2020-05-11 | Discharge: 2020-05-11 | Disposition: A | Discharge status: Home / Self Care | Payer: Medicare Other | Attending: Vascular Surgery | Admitting: Vascular Surgery

## 2020-05-11 ENCOUNTER — Other Ambulatory Visit: Payer: Self-pay

## 2020-05-11 DIAGNOSIS — I129 Hypertensive chronic kidney disease with stage 1 through stage 4 chronic kidney disease, or unspecified chronic kidney disease: Secondary | ICD-10-CM | POA: Insufficient documentation

## 2020-05-11 DIAGNOSIS — N189 Chronic kidney disease, unspecified: Secondary | ICD-10-CM | POA: Insufficient documentation

## 2020-05-11 DIAGNOSIS — Z794 Long term (current) use of insulin: Secondary | ICD-10-CM | POA: Insufficient documentation

## 2020-05-11 DIAGNOSIS — Z7952 Long term (current) use of systemic steroids: Secondary | ICD-10-CM | POA: Diagnosis not present

## 2020-05-11 DIAGNOSIS — Y832 Surgical operation with anastomosis, bypass or graft as the cause of abnormal reaction of the patient, or of later complication, without mention of misadventure at the time of the procedure: Secondary | ICD-10-CM | POA: Diagnosis not present

## 2020-05-11 DIAGNOSIS — Z888 Allergy status to other drugs, medicaments and biological substances status: Secondary | ICD-10-CM | POA: Diagnosis not present

## 2020-05-11 DIAGNOSIS — N183 Chronic kidney disease, stage 3 unspecified: Secondary | ICD-10-CM

## 2020-05-11 DIAGNOSIS — T829XXA Unspecified complication of cardiac and vascular prosthetic device, implant and graft, initial encounter: Secondary | ICD-10-CM | POA: Insufficient documentation

## 2020-05-11 DIAGNOSIS — N185 Chronic kidney disease, stage 5: Secondary | ICD-10-CM | POA: Diagnosis not present

## 2020-05-11 DIAGNOSIS — T82898A Other specified complication of vascular prosthetic devices, implants and grafts, initial encounter: Secondary | ICD-10-CM | POA: Diagnosis not present

## 2020-05-11 DIAGNOSIS — Z7982 Long term (current) use of aspirin: Secondary | ICD-10-CM | POA: Insufficient documentation

## 2020-05-11 DIAGNOSIS — E1122 Type 2 diabetes mellitus with diabetic chronic kidney disease: Secondary | ICD-10-CM | POA: Insufficient documentation

## 2020-05-11 DIAGNOSIS — Z79899 Other long term (current) drug therapy: Secondary | ICD-10-CM | POA: Insufficient documentation

## 2020-05-11 DIAGNOSIS — I770 Arteriovenous fistula, acquired: Secondary | ICD-10-CM | POA: Diagnosis not present

## 2020-05-11 HISTORY — PX: FISTULA SUPERFICIALIZATION: SHX6341

## 2020-05-11 LAB — POCT I-STAT, CHEM 8
BUN: 46 mg/dL — ABNORMAL HIGH (ref 8–23)
Calcium, Ion: 1.23 mmol/L (ref 1.15–1.40)
Chloride: 101 mmol/L (ref 98–111)
Creatinine, Ser: 5.2 mg/dL — ABNORMAL HIGH (ref 0.61–1.24)
Glucose, Bld: 199 mg/dL — ABNORMAL HIGH (ref 70–99)
HCT: 31 % — ABNORMAL LOW (ref 39.0–52.0)
Hemoglobin: 10.5 g/dL — ABNORMAL LOW (ref 13.0–17.0)
Potassium: 4.2 mmol/L (ref 3.5–5.1)
Sodium: 141 mmol/L (ref 135–145)
TCO2: 25 mmol/L (ref 22–32)

## 2020-05-11 LAB — GLUCOSE, CAPILLARY
Glucose-Capillary: 202 mg/dL — ABNORMAL HIGH (ref 70–99)
Glucose-Capillary: 214 mg/dL — ABNORMAL HIGH (ref 70–99)

## 2020-05-11 SURGERY — FISTULA SUPERFICIALIZATION
Anesthesia: Monitor Anesthesia Care | Site: Arm Upper | Laterality: Right

## 2020-05-11 MED ORDER — CHLORHEXIDINE GLUCONATE 4 % EX LIQD: 60.0000 mL | Freq: Once | CUTANEOUS | Status: DC

## 2020-05-11 MED ORDER — LIDOCAINE HCL (PF) 1 % IJ SOLN
INTRAMUSCULAR | Status: AC
  Filled 2020-05-11: qty 30

## 2020-05-11 MED ORDER — SODIUM CHLORIDE 0.9 % IV SOLN: INTRAVENOUS | Status: DC

## 2020-05-11 MED ORDER — PROPOFOL 10 MG/ML IV BOLUS
INTRAVENOUS | Status: AC
  Filled 2020-05-11: qty 20

## 2020-05-11 MED ORDER — SODIUM CHLORIDE 0.9 % IV SOLN
INTRAVENOUS | Status: AC
  Filled 2020-05-11: qty 1.2

## 2020-05-11 MED ORDER — FENTANYL CITRATE (PF) 100 MCG/2ML IJ SOLN: 25.0000 ug | INTRAMUSCULAR | Status: DC | PRN

## 2020-05-11 MED ORDER — SODIUM CHLORIDE 0.9 % IV SOLN
INTRAVENOUS | Status: DC | PRN
  Administered 2020-05-11: 500 mL

## 2020-05-11 MED ORDER — LIDOCAINE HCL 1 % IJ SOLN
INTRAMUSCULAR | Status: DC | PRN
  Administered 2020-05-11: 16 mL

## 2020-05-11 MED ORDER — ONDANSETRON HCL 4 MG/2ML IJ SOLN
INTRAMUSCULAR | Status: AC
  Filled 2020-05-11: qty 2

## 2020-05-11 MED ORDER — ALBUTEROL SULFATE HFA 108 (90 BASE) MCG/ACT IN AERS
INHALATION_SPRAY | RESPIRATORY_TRACT | Status: DC | PRN
Start: 2020-05-11 — End: 2020-05-11
  Administered 2020-05-11: 2 via RESPIRATORY_TRACT

## 2020-05-11 MED ORDER — FENTANYL CITRATE (PF) 250 MCG/5ML IJ SOLN
INTRAMUSCULAR | Status: AC
  Filled 2020-05-11: qty 5

## 2020-05-11 MED ORDER — CHLORHEXIDINE GLUCONATE 0.12 % MT SOLN
OROMUCOSAL | Status: AC
  Administered 2020-05-11: 15 mL via OROMUCOSAL
  Filled 2020-05-11: qty 15

## 2020-05-11 MED ORDER — MIDAZOLAM HCL 2 MG/2ML IJ SOLN
INTRAMUSCULAR | Status: AC
  Filled 2020-05-11: qty 2

## 2020-05-11 MED ORDER — LIDOCAINE 2% (20 MG/ML) 5 ML SYRINGE
INTRAMUSCULAR | Status: AC
  Filled 2020-05-11: qty 5

## 2020-05-11 MED ORDER — LACTATED RINGERS IV SOLN: INTRAVENOUS | Status: DC

## 2020-05-11 MED ORDER — ACETAMINOPHEN 500 MG PO TABS: 1000.0000 mg | ORAL_TABLET | Freq: Once | ORAL | Status: AC

## 2020-05-11 MED ORDER — HEPARIN SODIUM (PORCINE) 1000 UNIT/ML IJ SOLN
INTRAMUSCULAR | Status: AC
  Filled 2020-05-11: qty 1

## 2020-05-11 MED ORDER — ONDANSETRON HCL 4 MG/2ML IJ SOLN
INTRAMUSCULAR | Status: DC | PRN
  Administered 2020-05-11: 4 mg via INTRAVENOUS

## 2020-05-11 MED ORDER — ORAL CARE MOUTH RINSE: 15.0000 mL | Freq: Once | OROMUCOSAL | Status: AC

## 2020-05-11 MED ORDER — ACETAMINOPHEN 500 MG PO TABS
ORAL_TABLET | ORAL | Status: AC
  Administered 2020-05-11: 1000 mg via ORAL
  Filled 2020-05-11: qty 2

## 2020-05-11 MED ORDER — ALBUTEROL SULFATE HFA 108 (90 BASE) MCG/ACT IN AERS
INHALATION_SPRAY | RESPIRATORY_TRACT | Status: AC
  Filled 2020-05-11: qty 6.7

## 2020-05-11 MED ORDER — 0.9 % SODIUM CHLORIDE (POUR BTL) OPTIME
TOPICAL | Status: DC | PRN
  Administered 2020-05-11: 1000 mL

## 2020-05-11 MED ORDER — ONDANSETRON HCL 4 MG/2ML IJ SOLN: 4.0000 mg | Freq: Once | INTRAMUSCULAR | Status: DC | PRN

## 2020-05-11 MED ORDER — DEXMEDETOMIDINE HCL 200 MCG/2ML IV SOLN
INTRAVENOUS | Status: DC | PRN
  Administered 2020-05-11 (×5): 4 ug via INTRAVENOUS
  Administered 2020-05-11: 8 ug via INTRAVENOUS

## 2020-05-11 MED ORDER — CHLORHEXIDINE GLUCONATE 0.12 % MT SOLN: 15.0000 mL | Freq: Once | OROMUCOSAL | Status: AC

## 2020-05-11 MED ORDER — DEXMEDETOMIDINE HCL IN NACL 200 MCG/50ML IV SOLN
INTRAVENOUS | Status: AC
  Filled 2020-05-11: qty 50

## 2020-05-11 MED ORDER — LIDOCAINE-EPINEPHRINE (PF) 1.5 %-1:200000 IJ SOLN
INTRAMUSCULAR | Status: DC | PRN
Start: 2020-05-11 — End: 2020-05-11
  Administered 2020-05-11: 30 mL via PERINEURAL

## 2020-05-11 MED ORDER — CEFAZOLIN SODIUM-DEXTROSE 2-4 GM/100ML-% IV SOLN
INTRAVENOUS | Status: AC
  Filled 2020-05-11: qty 100

## 2020-05-11 MED ORDER — OXYCODONE-ACETAMINOPHEN 5-325 MG PO TABS: 1.0000 | ORAL_TABLET | Freq: Four times a day (QID) | ORAL | 0 refills | Status: DC | PRN

## 2020-05-11 MED ORDER — CEFAZOLIN SODIUM-DEXTROSE 2-4 GM/100ML-% IV SOLN
2.0000 g | INTRAVENOUS | Status: AC
  Administered 2020-05-11: 2 g via INTRAVENOUS

## 2020-05-11 SURGICAL SUPPLY — 38 items
ADH SKN CLS APL DERMABOND .7 (GAUZE/BANDAGES/DRESSINGS) ×1
AGENT HMST SPONGE THK3/8 (HEMOSTASIS)
ARMBAND PINK RESTRICT EXTREMIT (MISCELLANEOUS) ×3 IMPLANT
CANISTER SUCT 3000ML PPV (MISCELLANEOUS) ×3 IMPLANT
CLIP VESOCCLUDE MED 6/CT (CLIP) ×3 IMPLANT
CLIP VESOCCLUDE SM WIDE 6/CT (CLIP) ×3 IMPLANT
COVER PROBE W GEL 5X96 (DRAPES) ×2 IMPLANT
COVER WAND RF STERILE (DRAPES) ×3 IMPLANT
DECANTER SPIKE VIAL GLASS SM (MISCELLANEOUS) ×3 IMPLANT
DERMABOND ADVANCED (GAUZE/BANDAGES/DRESSINGS) ×2
DERMABOND ADVANCED .7 DNX12 (GAUZE/BANDAGES/DRESSINGS) ×1 IMPLANT
ELECT REM PT RETURN 9FT ADLT (ELECTROSURGICAL) ×3
ELECTRODE REM PT RTRN 9FT ADLT (ELECTROSURGICAL) ×1 IMPLANT
GLOVE BIO SURGEON STRL SZ 6.5 (GLOVE) ×3 IMPLANT
GLOVE BIO SURGEON STRL SZ7.5 (GLOVE) ×3 IMPLANT
GLOVE BIO SURGEONS STRL SZ 6.5 (GLOVE) ×3
GLOVE BIOGEL PI IND STRL 8 (GLOVE) ×1 IMPLANT
GLOVE BIOGEL PI INDICATOR 8 (GLOVE) ×2
GLOVE INDICATOR 6.5 STRL GRN (GLOVE) ×4 IMPLANT
GOWN STRL REUS W/ TWL LRG LVL3 (GOWN DISPOSABLE) ×2 IMPLANT
GOWN STRL REUS W/ TWL XL LVL3 (GOWN DISPOSABLE) ×2 IMPLANT
GOWN STRL REUS W/TWL LRG LVL3 (GOWN DISPOSABLE) ×9
GOWN STRL REUS W/TWL XL LVL3 (GOWN DISPOSABLE) ×6
HEMOSTAT SPONGE AVITENE ULTRA (HEMOSTASIS) IMPLANT
KIT BASIN OR (CUSTOM PROCEDURE TRAY) ×3 IMPLANT
KIT TURNOVER KIT B (KITS) ×3 IMPLANT
MARKER SKIN DUAL TIP RULER LAB (MISCELLANEOUS) ×2 IMPLANT
NS IRRIG 1000ML POUR BTL (IV SOLUTION) ×3 IMPLANT
PACK CV ACCESS (CUSTOM PROCEDURE TRAY) ×3 IMPLANT
PAD ARMBOARD 7.5X6 YLW CONV (MISCELLANEOUS) ×6 IMPLANT
SUT MNCRL AB 4-0 PS2 18 (SUTURE) ×5 IMPLANT
SUT PROLENE 6 0 BV (SUTURE) ×2 IMPLANT
SUT PROLENE 7 0 BV 1 (SUTURE) ×1 IMPLANT
SUT VIC AB 3-0 SH 27 (SUTURE) ×6
SUT VIC AB 3-0 SH 27X BRD (SUTURE) ×2 IMPLANT
TOWEL GREEN STERILE (TOWEL DISPOSABLE) ×3 IMPLANT
UNDERPAD 30X36 HEAVY ABSORB (UNDERPADS AND DIAPERS) ×3 IMPLANT
WATER STERILE IRR 1000ML POUR (IV SOLUTION) ×3 IMPLANT

## 2020-05-11 NOTE — Discharge Instructions (Signed)
[facility]  Discharge Instructions  AV Fistula or Graft Surgery for Dialysis Access  Please refer to the following instructions for your post-procedure care. Your surgeon or physician assistant will discuss any changes with you.  Activity  You may drive the day following your surgery, if you are comfortable and no longer taking prescription pain medication. Resume full activity as the soreness in your incision resolves.  Bathing/Showering  You may shower after you go home. Keep your incision dry for 48 hours. Do not soak in a bathtub, hot tub, or swim until the incision heals completely. You may not shower if you have a hemodialysis catheter.  Incision Care  Clean your incision with mild soap and water after 48 hours. Pat the area dry with a clean towel. You do not need a bandage unless otherwise instructed. Do not apply any ointments or creams to your incision. You may have skin glue on your incision. Do not peel it off. It will come off on its own in about one week. Your arm may swell a bit after surgery. To reduce swelling use pillows to elevate your arm so it is above your heart. Your doctor will tell you if you need to lightly wrap your arm with an ACE bandage.  Diet  Resume your normal diet. There are not special food restrictions following this procedure. In order to heal from your surgery, it is CRITICAL to get adequate nutrition. Your body requires vitamins, minerals, and protein. Vegetables are the best source of vitamins and minerals. Vegetables also provide the perfect balance of protein. Processed food has little nutritional value, so try to avoid this.  Medications  Resume taking all of your medications. If your incision is causing pain, you may take over-the counter pain relievers such as acetaminophen (Tylenol). If you were prescribed a stronger pain medication, please be aware these medications can cause nausea and constipation. Prevent  nausea by taking the medication with a snack or meal. Avoid constipation by drinking plenty of fluids and eating foods with high amount of fiber, such as fruits, vegetables, and grains.  Do not take Tylenol if you are taking prescription pain medications.  Follow up Your surgeon may want to see you in the office following your access surgery. If so, this will be arranged at the time of your surgery.  Please call us immediately for any of the following conditions:  . Increased pain, redness, drainage (pus) from your incision site . Fever of 101 degrees or higher . Severe or worsening pain at your incision site . Hand pain or numbness. .  Reduce your risk of vascular disease:  . Stop smoking. If you would like help, call [facility] at 1-800-QUIT-NOW (334) 174-7862) or [facility] at 602-567-6108  . Manage your cholesterol . Maintain a desired weight . Control your diabetes . Keep your blood pressure down  Dialysis  It will take several weeks to several months for your new dialysis access to be ready for use. Your surgeon will determine when it is okay to use it. Your nephrologist will continue to direct your dialysis. You can continue to use your Permcath until your new access is ready for use.   05/11/2020 Ryan Jimenez [id number] 03-13-1932  Surgeon(s): Marty Heck, MD  Procedure(s): FISTULA REVISION, BRANCH LIGATION AND SUPERFICIALIZATION OF RIGHT UPPER ARM FISTULA  x Do not stick fistula for 4 weeks    If you have any questions, please call the office at [phone].

## 2020-05-11 NOTE — Anesthesia Procedure Notes (Addendum)
Procedure Name: MAC Date/Time: 05/11/2020 7:40 AM Performed by: Jenne Campus, CRNA Pre-anesthesia Checklist: Patient identified, Emergency Drugs available, Suction available and Patient being monitored Oxygen Delivery Method: Simple face mask Placement Confirmation: positive ETCO2

## 2020-05-11 NOTE — H&P (Signed)
History and Physical Interval Note:  05/11/2020 7:17 AM  Ryan Jimenez  has presented today for surgery, with the diagnosis of CHRONIC KIDNEY DISEASE.  The various methods of treatment have been discussed with the patient and family. After consideration of risks, benefits and other options for treatment, the patient has consented to  Procedure(s): BRANCH LIGATION AND SUPERFICIALIZATION OF RIGHT UPPER ARM FISTULA (Right) as a surgical intervention.  The patient's history has been reviewed, patient examined, no change in status, stable for surgery.  I have reviewed the patient's chart and labs.  Questions were answered to the patient's satisfaction.    Revision of right brachiocephalic AVF and side-branch ligation.    Marty Heck  POST OPERATIVE OFFICE NOTE    CC:  F/u for surgery  HPI:  This is a 86 y.o. male who is s/p right BC AV fistula creation  On 03/10/2020 by Dr. Oneida Alar.  Pt returns today for follow up.              He denise pain, loss of sensation or loss or motor in the left UE.       Allergies  Allergen Reactions  . Clonidine Derivatives Other (See Comments)    Not known          Current Outpatient Medications  Medication Sig Dispense Refill  . albuterol (PROVENTIL) (2.5 MG/3ML) 0.083% nebulizer solution Take 3 mLs (2.5 mg total) by nebulization every 6 (six) hours as needed for wheezing or shortness of breath. 75 mL 0  . allopurinol (ZYLOPRIM) 100 MG tablet Take 100 mg by mouth daily.    [name] amLODipine (NORVASC) 10 MG tablet Take 10 mg by mouth daily.    [name] aspirin EC 81 MG tablet Take 81 mg by mouth daily.    . benzonatate (TESSALON) 100 MG capsule Take 200 mg by mouth every 8 (eight) hours as needed.    . carvedilol (COREG) 12.5 MG tablet Take 1 tablet (12.5 mg total) by mouth 2 (two) times daily with a meal. (Patient taking differently: Take 12.5 mg by mouth 2 (two) times daily with a meal. 10am and 10pm) 60 tablet 1  . Cholecalciferol  (VITAMIN D) 125 MCG (5000 UT) CAPS Take 5,000 Units by mouth daily.    . colchicine 0.6 MG tablet Take 0.6 mg by mouth daily as needed (gout).     [name] dutasteride (AVODART) 0.5 MG capsule Take 0.5 mg by mouth daily.    . fluticasone (FLONASE) 50 MCG/ACT nasal spray Place 2 sprays into both nostrils daily. (Patient taking differently: Place 2 sprays into both nostrils daily as needed for allergies. ) 1 g 0  . hydrALAZINE (APRESOLINE) 25 MG tablet Take 25 mg by mouth 2 (two) times daily.    . insulin aspart (NOVOLOG) 100 UNIT/ML injection Inject 6-8 Units into the skin 3 (three) times daily as needed for high blood sugar.    [name] LEVEMIR FLEXTOUCH 100 UNIT/ML Pen Inject 45-60 Units into the skin at bedtime as needed (high blood sugar). Per sliding scale  2  . losartan (COZAAR) 50 MG tablet Take 50 mg by mouth 2 (two) times daily.    . Multiple Vitamins-Minerals (MULTIVITAMIN WITH MINERALS) tablet Take 1 tablet by mouth daily.    [name] oxyCODONE-acetaminophen (PERCOCET) 7.5-325 MG tablet Take 1 tablet by mouth every 4 (four) hours as needed for severe pain. 6 tablet 0  . predniSONE (DELTASONE) 20 MG tablet Take 1 tablet (20 mg total) by mouth 2 (two) times  daily with a meal. 10 tablet 0  . rosuvastatin (CRESTOR) 10 MG tablet Take 1 tablet (10 mg total) by mouth at bedtime. 30 tablet 1  . tamsulosin (FLOMAX) 0.4 MG CAPS Take 1 capsule (0.4 mg total) by mouth at bedtime. (Patient taking differently: Take 0.8 mg by mouth at bedtime. ) 30 capsule 1  . Tetrahydroz-Dextran-PEG-Povid (EYE DROPS ADVANCED RELIEF OP) Place 1 drop into both eyes daily as needed (dry eyes).      No current facility-administered medications for this visit.     ROS:  See HPI  Physical Exam:    Findings:  +--------------------+----------+-----------------+--------+  AVF         PSV (cm/s)Flow Vol (mL/min)Comments  +--------------------+----------+-----------------+--------+  Native artery inflow   357     1742          +--------------------+----------+-----------------+--------+  AVF Anastomosis     777                 +--------------------+----------+-----------------+--------+     +------------+----------+-------------+----------+----------------+  OUTFLOW VEINPSV (cm/s)Diameter (cm)Depth (cm)  Describe    +------------+----------+-------------+----------+----------------+  Shoulder    248    0.51     0.78  competing branch  +------------+----------+-------------+----------+----------------+  Prox UA     191    0.49     0.58  competing branch  +------------+----------+-------------+----------+----------------+  Mid UA     606    0.57     0.46  competing branch  +------------+----------+-------------+----------+----------------+  Dist UA     208    0.48     0.40  competing branch  +------------+----------+-------------+----------+----------------+  AC Fossa    393    0.75     0.35  competing branch  +------------+----------+-------------+----------+----------------+     Summary:  Patent arteriovenous fistula.   Incision:  Well healed  Extremities:  Palpable thrill at anastomosis, the fistula is not visible up the biceps area of the arm.  Distal palpable radial pulse, sensation and motor intact.  Lungs: non labored breathing   Assessment/Plan:  This is a 86 y.o. male who is s/p:Patent BC AV fistula.  The fistula has not matured enough and has multiple competing branches.  I have recommended ligation of competing branches and superficialization of the fistula.              He and his daughter are in agreement of this plan of care.  He is not currently on HD.   Roxy Horseman PA-C [facility] [phone]  Clinic MD:  Oneida Alar

## 2020-05-11 NOTE — Transfer of Care (Signed)
Immediate Anesthesia Transfer of Care Note  Patient: Ryan Jimenez  Procedure(s) Performed: FISTULA REVISION, BRANCH LIGATION AND SUPERFICIALIZATION OF RIGHT UPPER ARM FISTULA (Right Arm Upper)  Patient Location: PACU  Anesthesia Type:MAC and Regional  Level of Consciousness: awake, oriented and patient cooperative  Airway & Oxygen Therapy: Patient Spontanous Breathing and Patient connected to face mask oxygen  Post-op Assessment: Report given to RN and Post -op Vital signs reviewed and stable  Post vital signs: Reviewed  Last Vitals:  Vitals Value Taken Time  BP 167/73 05/11/20 0915  Temp    Pulse 57 05/11/20 0917  Resp 17 05/11/20 0917  SpO2 98 % 05/11/20 0917  Vitals shown include unvalidated device data.  Last Pain:  Vitals:   05/11/20 0608  PainSc: 0-No pain      Patients Stated Pain Goal: 3 (18/40/37 5436)  Complications: No complications documented.

## 2020-05-11 NOTE — Anesthesia Procedure Notes (Signed)
Anesthesia Regional Block: Supraclavicular block   Pre-Anesthetic Checklist: ,, timeout performed, Correct Patient, Correct Site, Correct Laterality, Correct Procedure, Correct Position, site marked, Risks and benefits discussed,  Surgical consent,  Pre-op evaluation,  At surgeon's request and post-op pain management  Laterality: Right  Prep: Maximum Sterile Barrier Precautions used, chloraprep       Needles:  Injection technique: Single-shot  Needle Type: Echogenic Stimulator Needle     Needle Length: 9cm  Needle Gauge: 22     Additional Needles:   Procedures:,,,, ultrasound used (permanent image in chart),,,,  Narrative:  Start time: 05/11/2020 7:20 AM End time: 05/11/2020 7:30 AM Injection made incrementally with aspirations every 5 mL.  Performed by: Personally  Anesthesiologist: Pervis Hocking, DO  Additional Notes: Monitors applied. No increased pain on injection. No increased resistance to injection. Injection made in 5cc increments. Good needle visualization. Patient tolerated procedure well.

## 2020-05-11 NOTE — Anesthesia Postprocedure Evaluation (Signed)
Anesthesia Post Note  Patient: Ryan Jimenez  Procedure(s) Performed: FISTULA REVISION, BRANCH LIGATION AND SUPERFICIALIZATION OF RIGHT UPPER ARM FISTULA (Right Arm Upper)     Patient location during evaluation: PACU Anesthesia Type: Regional and MAC Level of consciousness: awake and alert Pain management: pain level controlled Vital Signs Assessment: post-procedure vital signs reviewed and stable Respiratory status: spontaneous breathing, nonlabored ventilation and respiratory function stable Cardiovascular status: blood pressure returned to baseline and stable Postop Assessment: no apparent nausea or vomiting Anesthetic complications: no   No complications documented.  Last Vitals:  Vitals:   05/11/20 0930 05/11/20 0941  BP: 101/71 (!) 149/74  Pulse: (!) 55 (!) 58  Resp: 15 16  Temp:  36.4 C  SpO2: 95% 95%    Last Pain:  Vitals:   05/11/20 0941  PainSc: 0-No pain                 Pervis Hocking

## 2020-05-11 NOTE — Op Note (Signed)
Date: May 11, 2020  Preoperative diagnosis: Slow to mature right upper extremity brachiocephalic AV fistula  Postoperative diagnosis: Same  Procedure: Right upper extremity AV fistula revision with sidebranch ligation x6 and superficialization  Surgeon: Dr. Marty Heck, MD  Assistant: Leontine Locket, PA  Indication: Patient is an 86 year old male who previously underwent creation of a right brachiocephalic AV fistula with Dr. Oneida Alar on 03/10/20.  He was seen in follow-up and the fistula remained small in the upper arm measuring only about 4 mm and also difficult to appreciate a thrill.  He presents today for sidebranch ligation as well as likely elevation of the fistula after risks benefits discussed.  Findings: There were at least six large side branches that were ligated through two skip incisions in the upper arm.  I also elevated the fistula by closing the subcutaneous tissue underneath it and running a 4-0 Monocryl approximating the skin just over top of the fistula.  There was an excellent thrill upon completion.  Anesthesia: Regional block  Details: Patient was taken to the operating room after informed consent was obtained.  Placed on operative table in supine position and right arm was prepped and draped in usual sterile fashion.  Preop timeout was performed identify patient, procedure and site.  Initially used sterile ultrasound and marked out the course of the cephalic vein in the upper arm fistula where the thrill was appreciable.  All side branches were then identified and markedoin the arm.  Then used 1% lidocaine and a total of 15 mL was used throughout the case to anesthetize the incisions given the block was only marginal.  Ultimately two skip incisions were made in the upper arm dissected down with Bovie cautery and then used blunt dissection with Metzenbaum scissors to circumferentially mobilized the fistula including under the skin bridges.  All side branches were  ligated between 3-0 silk ties and divided.  Ultimately there was an excellent thrill upon completion and after ligation of side branches.  I then elevated the fistula by closing subcutaneous tissue underneath it with 3-0 Vicryl in interrupted fashion.  The skin was then run closed with 4-0 Monocryl and Dermabond was applied.  Excellent thrill upon completion.  He was taken to PACU in stable condition.  Complication: None  Condition: Stable  Marty Heck, MD [facility] [place] Office: [place]

## 2020-05-12 ENCOUNTER — Ambulatory Visit: Payer: Medicare Other | Admitting: Podiatry

## 2020-05-12 ENCOUNTER — Encounter (HOSPITAL_COMMUNITY): Payer: Self-pay | Admitting: Vascular Surgery

## 2020-05-16 ENCOUNTER — Inpatient Hospital Stay (HOSPITAL_COMMUNITY)
Admission: EM | Admit: 2020-05-16 | Discharge: 2020-05-20 | DRG: 640 | Disposition: A | Discharge status: Home Health | Payer: Medicare Other | Attending: Internal Medicine | Admitting: Internal Medicine

## 2020-05-16 ENCOUNTER — Inpatient Hospital Stay (HOSPITAL_COMMUNITY): Payer: Medicare Other

## 2020-05-16 ENCOUNTER — Other Ambulatory Visit: Payer: Self-pay

## 2020-05-16 ENCOUNTER — Emergency Department (HOSPITAL_COMMUNITY): Payer: Medicare Other

## 2020-05-16 ENCOUNTER — Encounter (HOSPITAL_COMMUNITY): Payer: Self-pay | Admitting: Internal Medicine

## 2020-05-16 DIAGNOSIS — N400 Enlarged prostate without lower urinary tract symptoms: Secondary | ICD-10-CM | POA: Diagnosis present

## 2020-05-16 DIAGNOSIS — E1122 Type 2 diabetes mellitus with diabetic chronic kidney disease: Secondary | ICD-10-CM | POA: Diagnosis present

## 2020-05-16 DIAGNOSIS — Z7982 Long term (current) use of aspirin: Secondary | ICD-10-CM

## 2020-05-16 DIAGNOSIS — E875 Hyperkalemia: Secondary | ICD-10-CM | POA: Diagnosis not present

## 2020-05-16 DIAGNOSIS — R609 Edema, unspecified: Secondary | ICD-10-CM

## 2020-05-16 DIAGNOSIS — Z87891 Personal history of nicotine dependence: Secondary | ICD-10-CM | POA: Diagnosis not present

## 2020-05-16 DIAGNOSIS — Z8701 Personal history of pneumonia (recurrent): Secondary | ICD-10-CM | POA: Diagnosis not present

## 2020-05-16 DIAGNOSIS — M3131 Wegener's granulomatosis with renal involvement: Secondary | ICD-10-CM | POA: Diagnosis present

## 2020-05-16 DIAGNOSIS — E1165 Type 2 diabetes mellitus with hyperglycemia: Secondary | ICD-10-CM | POA: Diagnosis present

## 2020-05-16 DIAGNOSIS — J9811 Atelectasis: Secondary | ICD-10-CM | POA: Diagnosis present

## 2020-05-16 DIAGNOSIS — R0602 Shortness of breath: Secondary | ICD-10-CM

## 2020-05-16 DIAGNOSIS — I120 Hypertensive chronic kidney disease with stage 5 chronic kidney disease or end stage renal disease: Secondary | ICD-10-CM | POA: Diagnosis present

## 2020-05-16 DIAGNOSIS — E119 Type 2 diabetes mellitus without complications: Secondary | ICD-10-CM

## 2020-05-16 DIAGNOSIS — I7789 Other specified disorders of arteries and arterioles: Secondary | ICD-10-CM | POA: Diagnosis present

## 2020-05-16 DIAGNOSIS — I4510 Unspecified right bundle-branch block: Secondary | ICD-10-CM | POA: Diagnosis present

## 2020-05-16 DIAGNOSIS — D631 Anemia in chronic kidney disease: Secondary | ICD-10-CM | POA: Diagnosis present

## 2020-05-16 DIAGNOSIS — I639 Cerebral infarction, unspecified: Secondary | ICD-10-CM | POA: Diagnosis present

## 2020-05-16 DIAGNOSIS — N185 Chronic kidney disease, stage 5: Secondary | ICD-10-CM | POA: Diagnosis present

## 2020-05-16 DIAGNOSIS — Z20822 Contact with and (suspected) exposure to covid-19: Secondary | ICD-10-CM | POA: Diagnosis present

## 2020-05-16 DIAGNOSIS — R7989 Other specified abnormal findings of blood chemistry: Secondary | ICD-10-CM | POA: Diagnosis present

## 2020-05-16 DIAGNOSIS — E7800 Pure hypercholesterolemia, unspecified: Secondary | ICD-10-CM | POA: Diagnosis present

## 2020-05-16 DIAGNOSIS — E785 Hyperlipidemia, unspecified: Secondary | ICD-10-CM | POA: Diagnosis present

## 2020-05-16 DIAGNOSIS — Z794 Long term (current) use of insulin: Secondary | ICD-10-CM

## 2020-05-16 DIAGNOSIS — Z833 Family history of diabetes mellitus: Secondary | ICD-10-CM

## 2020-05-16 DIAGNOSIS — M109 Gout, unspecified: Secondary | ICD-10-CM | POA: Diagnosis present

## 2020-05-16 DIAGNOSIS — J9600 Acute respiratory failure, unspecified whether with hypoxia or hypercapnia: Secondary | ICD-10-CM | POA: Diagnosis present

## 2020-05-16 DIAGNOSIS — E8779 Other fluid overload: Secondary | ICD-10-CM | POA: Diagnosis present

## 2020-05-16 DIAGNOSIS — Z8249 Family history of ischemic heart disease and other diseases of the circulatory system: Secondary | ICD-10-CM

## 2020-05-16 DIAGNOSIS — Z889 Allergy status to unspecified drugs, medicaments and biological substances status: Secondary | ICD-10-CM

## 2020-05-16 DIAGNOSIS — N19 Unspecified kidney failure: Secondary | ICD-10-CM | POA: Diagnosis present

## 2020-05-16 DIAGNOSIS — I10 Essential (primary) hypertension: Secondary | ICD-10-CM | POA: Diagnosis not present

## 2020-05-16 DIAGNOSIS — R5381 Other malaise: Secondary | ICD-10-CM | POA: Diagnosis present

## 2020-05-16 DIAGNOSIS — J42 Unspecified chronic bronchitis: Secondary | ICD-10-CM | POA: Diagnosis present

## 2020-05-16 DIAGNOSIS — Z6839 Body mass index (BMI) 39.0-39.9, adult: Secondary | ICD-10-CM | POA: Diagnosis not present

## 2020-05-16 DIAGNOSIS — N179 Acute kidney failure, unspecified: Secondary | ICD-10-CM | POA: Diagnosis present

## 2020-05-16 DIAGNOSIS — J9601 Acute respiratory failure with hypoxia: Secondary | ICD-10-CM | POA: Diagnosis present

## 2020-05-16 DIAGNOSIS — Z79899 Other long term (current) drug therapy: Secondary | ICD-10-CM

## 2020-05-16 LAB — BRAIN NATRIURETIC PEPTIDE: B Natriuretic Peptide: 292.6 pg/mL — ABNORMAL HIGH (ref 0.0–100.0)

## 2020-05-16 LAB — BASIC METABOLIC PANEL
Anion gap: 10 (ref 5–15)
BUN: 50 mg/dL — ABNORMAL HIGH (ref 8–23)
CO2: 28 mmol/L (ref 22–32)
Calcium: 8.3 mg/dL — ABNORMAL LOW (ref 8.9–10.3)
Chloride: 99 mmol/L (ref 98–111)
Creatinine, Ser: 4.5 mg/dL — ABNORMAL HIGH (ref 0.61–1.24)
GFR calc Af Amer: 13 mL/min — ABNORMAL LOW (ref >60)
GFR calc non Af Amer: 11 mL/min — ABNORMAL LOW (ref >60)
Glucose, Bld: 170 mg/dL — ABNORMAL HIGH (ref 70–99)
Potassium: 4.7 mmol/L (ref 3.5–5.1)
Sodium: 137 mmol/L (ref 135–145)

## 2020-05-16 LAB — CBC WITH DIFFERENTIAL/PLATELET
Abs Immature Granulocytes: 0.01 10*3/uL (ref 0.00–0.07)
Basophils Absolute: 0 10*3/uL (ref 0.0–0.1)
Basophils Relative: 0 %
Eosinophils Absolute: 0.5 10*3/uL (ref 0.0–0.5)
Eosinophils Relative: 6 %
HCT: 29 % — ABNORMAL LOW (ref 39.0–52.0)
Hemoglobin: 9 g/dL — ABNORMAL LOW (ref 13.0–17.0)
Immature Granulocytes: 0 %
Lymphocytes Relative: 18 %
Lymphs Abs: 1.4 10*3/uL (ref 0.7–4.0)
MCH: 26.9 pg (ref 26.0–34.0)
MCHC: 31 g/dL (ref 30.0–36.0)
MCV: 86.8 fL (ref 80.0–100.0)
Monocytes Absolute: 0.5 10*3/uL (ref 0.1–1.0)
Monocytes Relative: 6 %
Neutro Abs: 5.4 10*3/uL (ref 1.7–7.7)
Neutrophils Relative %: 70 %
Platelets: 184 10*3/uL (ref 150–400)
RBC: 3.34 MIL/uL — ABNORMAL LOW (ref 4.22–5.81)
RDW: 14.7 % (ref 11.5–15.5)
WBC: 7.8 10*3/uL (ref 4.0–10.5)
nRBC: 0 % (ref 0.0–0.2)

## 2020-05-16 LAB — SARS CORONAVIRUS 2 BY RT PCR (HOSPITAL ORDER, PERFORMED IN ~~LOC~~ HOSPITAL LAB): SARS Coronavirus 2: NEGATIVE

## 2020-05-16 LAB — GLUCOSE, CAPILLARY: Glucose-Capillary: 274 mg/dL — ABNORMAL HIGH (ref 70–99)

## 2020-05-16 LAB — D-DIMER, QUANTITATIVE: D-Dimer, Quant: 1.73 ug/mL-FEU — ABNORMAL HIGH (ref 0.00–0.50)

## 2020-05-16 MED ORDER — ACETAMINOPHEN 650 MG RE SUPP: 650.0000 mg | Freq: Four times a day (QID) | RECTAL | Status: DC | PRN

## 2020-05-16 MED ORDER — GUAIFENESIN ER 600 MG PO TB12
600.0000 mg | ORAL_TABLET | Freq: Two times a day (BID) | ORAL | Status: DC
  Administered 2020-05-16 – 2020-05-20 (×8): 600 mg via ORAL
  Filled 2020-05-16 (×8): qty 1

## 2020-05-16 MED ORDER — OXYCODONE-ACETAMINOPHEN 5-325 MG PO TABS: 1.0000 | ORAL_TABLET | Freq: Four times a day (QID) | ORAL | Status: DC | PRN

## 2020-05-16 MED ORDER — AMLODIPINE BESYLATE 5 MG PO TABS
10.0000 mg | ORAL_TABLET | Freq: Every day | ORAL | Status: DC
  Administered 2020-05-16 – 2020-05-20 (×5): 10 mg via ORAL
  Filled 2020-05-16 (×6): qty 2

## 2020-05-16 MED ORDER — TETRAHYDROZ-DEXTRAN-PEG-POVID 0.05-0.1-1-1 % OP SOLN: Freq: Every day | OPHTHALMIC | Status: DC | PRN

## 2020-05-16 MED ORDER — ROSUVASTATIN CALCIUM 10 MG PO TABS
10.0000 mg | ORAL_TABLET | Freq: Every day | ORAL | Status: DC
  Administered 2020-05-16 – 2020-05-19 (×4): 10 mg via ORAL
  Filled 2020-05-16 (×4): qty 1

## 2020-05-16 MED ORDER — IPRATROPIUM BROMIDE 0.02 % IN SOLN
0.5000 mg | Freq: Once | RESPIRATORY_TRACT | Status: AC
  Administered 2020-05-16: 0.5 mg via RESPIRATORY_TRACT
  Filled 2020-05-16: qty 2.5

## 2020-05-16 MED ORDER — INSULIN DETEMIR 100 UNIT/ML ~~LOC~~ SOLN
25.0000 [IU] | Freq: Every day | SUBCUTANEOUS | Status: DC
  Administered 2020-05-17 (×2): 25 [IU] via SUBCUTANEOUS
  Filled 2020-05-16 (×2): qty 0.25

## 2020-05-16 MED ORDER — ACETAMINOPHEN 325 MG PO TABS
650.0000 mg | ORAL_TABLET | Freq: Four times a day (QID) | ORAL | Status: DC | PRN
  Administered 2020-05-16: 650 mg via ORAL
  Filled 2020-05-16: qty 2

## 2020-05-16 MED ORDER — ASPIRIN EC 81 MG PO TBEC
81.0000 mg | DELAYED_RELEASE_TABLET | Freq: Every day | ORAL | Status: DC
  Administered 2020-05-16 – 2020-05-20 (×5): 81 mg via ORAL
  Filled 2020-05-16 (×5): qty 1

## 2020-05-16 MED ORDER — BENZONATATE 100 MG PO CAPS
200.0000 mg | ORAL_CAPSULE | Freq: Three times a day (TID) | ORAL | Status: DC | PRN
  Administered 2020-05-16 – 2020-05-18 (×2): 200 mg via ORAL
  Filled 2020-05-16 (×2): qty 2

## 2020-05-16 MED ORDER — ALBUTEROL SULFATE (2.5 MG/3ML) 0.083% IN NEBU: 2.5000 mg | INHALATION_SOLUTION | Freq: Four times a day (QID) | RESPIRATORY_TRACT | Status: DC

## 2020-05-16 MED ORDER — SENNOSIDES-DOCUSATE SODIUM 8.6-50 MG PO TABS
1.0000 | ORAL_TABLET | Freq: Every evening | ORAL | Status: DC | PRN
  Administered 2020-05-17: 1 via ORAL
  Filled 2020-05-16: qty 1

## 2020-05-16 MED ORDER — COLCHICINE 0.6 MG PO TABS: 0.6000 mg | ORAL_TABLET | Freq: Every day | ORAL | Status: DC | PRN

## 2020-05-16 MED ORDER — LORATADINE 10 MG PO TABS
10.0000 mg | ORAL_TABLET | Freq: Every day | ORAL | Status: DC
  Administered 2020-05-16 – 2020-05-20 (×5): 10 mg via ORAL
  Filled 2020-05-16 (×5): qty 1

## 2020-05-16 MED ORDER — INSULIN ASPART 100 UNIT/ML ~~LOC~~ SOLN
0.0000 [IU] | Freq: Every day | SUBCUTANEOUS | Status: DC
  Administered 2020-05-17: 3 [IU] via SUBCUTANEOUS
  Filled 2020-05-16: qty 0.05

## 2020-05-16 MED ORDER — LOSARTAN POTASSIUM 50 MG PO TABS
50.0000 mg | ORAL_TABLET | Freq: Two times a day (BID) | ORAL | Status: DC
  Administered 2020-05-16: 50 mg via ORAL
  Filled 2020-05-16: qty 1

## 2020-05-16 MED ORDER — ONDANSETRON HCL 4 MG PO TABS: 4.0000 mg | ORAL_TABLET | Freq: Four times a day (QID) | ORAL | Status: DC | PRN

## 2020-05-16 MED ORDER — HEPARIN SODIUM (PORCINE) 5000 UNIT/ML IJ SOLN
5000.0000 [IU] | Freq: Three times a day (TID) | INTRAMUSCULAR | Status: DC
  Administered 2020-05-16 – 2020-05-20 (×11): 5000 [IU] via SUBCUTANEOUS
  Filled 2020-05-16 (×11): qty 1

## 2020-05-16 MED ORDER — DUTASTERIDE 0.5 MG PO CAPS
0.5000 mg | ORAL_CAPSULE | Freq: Every day | ORAL | Status: DC
  Administered 2020-05-17 – 2020-05-20 (×5): 0.5 mg via ORAL
  Filled 2020-05-16 (×6): qty 1

## 2020-05-16 MED ORDER — METHYLPREDNISOLONE SODIUM SUCC 125 MG IJ SOLR
125.0000 mg | Freq: Once | INTRAMUSCULAR | Status: AC
  Administered 2020-05-16: 125 mg via INTRAVENOUS
  Filled 2020-05-16: qty 2

## 2020-05-16 MED ORDER — ALBUTEROL (5 MG/ML) CONTINUOUS INHALATION SOLN
10.0000 mg/h | INHALATION_SOLUTION | Freq: Once | RESPIRATORY_TRACT | Status: AC
  Administered 2020-05-16: 10 mg/h via RESPIRATORY_TRACT
  Filled 2020-05-16: qty 20

## 2020-05-16 MED ORDER — HYDRALAZINE HCL 25 MG PO TABS
25.0000 mg | ORAL_TABLET | Freq: Two times a day (BID) | ORAL | Status: DC
  Administered 2020-05-16 – 2020-05-20 (×8): 25 mg via ORAL
  Filled 2020-05-16 (×8): qty 1

## 2020-05-16 MED ORDER — ALBUTEROL SULFATE (2.5 MG/3ML) 0.083% IN NEBU
2.5000 mg | INHALATION_SOLUTION | RESPIRATORY_TRACT | Status: DC | PRN
  Filled 2020-05-16: qty 3

## 2020-05-16 MED ORDER — IPRATROPIUM BROMIDE 0.02 % IN SOLN: 0.5000 mg | Freq: Four times a day (QID) | RESPIRATORY_TRACT | Status: DC

## 2020-05-16 MED ORDER — CARVEDILOL 12.5 MG PO TABS
12.5000 mg | ORAL_TABLET | Freq: Two times a day (BID) | ORAL | Status: DC
  Administered 2020-05-16 – 2020-05-20 (×8): 12.5 mg via ORAL
  Filled 2020-05-16 (×8): qty 1

## 2020-05-16 MED ORDER — ONDANSETRON HCL 4 MG/2ML IJ SOLN: 4.0000 mg | Freq: Four times a day (QID) | INTRAMUSCULAR | Status: DC | PRN

## 2020-05-16 MED ORDER — ALLOPURINOL 100 MG PO TABS
100.0000 mg | ORAL_TABLET | Freq: Every day | ORAL | Status: DC
  Administered 2020-05-17 – 2020-05-20 (×5): 100 mg via ORAL
  Filled 2020-05-16 (×6): qty 1

## 2020-05-16 MED ORDER — POLYVINYL ALCOHOL 1.4 % OP SOLN
1.0000 [drp] | OPHTHALMIC | Status: DC | PRN
  Administered 2020-05-17: 1 [drp] via OPHTHALMIC
  Filled 2020-05-16: qty 15

## 2020-05-16 MED ORDER — FUROSEMIDE 10 MG/ML IJ SOLN
40.0000 mg | Freq: Once | INTRAMUSCULAR | Status: AC
  Administered 2020-05-16: 40 mg via INTRAVENOUS
  Filled 2020-05-16: qty 4

## 2020-05-16 MED ORDER — IPRATROPIUM-ALBUTEROL 0.5-2.5 (3) MG/3ML IN SOLN
3.0000 mL | Freq: Three times a day (TID) | RESPIRATORY_TRACT | Status: DC
  Administered 2020-05-16 – 2020-05-18 (×6): 3 mL via RESPIRATORY_TRACT
  Filled 2020-05-16 (×6): qty 3

## 2020-05-16 MED ORDER — FLUTICASONE PROPIONATE 50 MCG/ACT NA SUSP
2.0000 | Freq: Every day | NASAL | Status: DC
  Administered 2020-05-17 – 2020-05-19 (×4): 2 via NASAL
  Filled 2020-05-16: qty 16

## 2020-05-16 MED ORDER — MIRTAZAPINE 7.5 MG PO TABS
7.5000 mg | ORAL_TABLET | Freq: Every evening | ORAL | Status: DC
  Filled 2020-05-16: qty 1

## 2020-05-16 MED ORDER — MIRTAZAPINE 15 MG PO TABS
7.5000 mg | ORAL_TABLET | Freq: Every evening | ORAL | Status: DC
  Administered 2020-05-17 – 2020-05-19 (×4): 7.5 mg via ORAL
  Filled 2020-05-16 (×3): qty 1

## 2020-05-16 MED ORDER — TAMSULOSIN HCL 0.4 MG PO CAPS
0.8000 mg | ORAL_CAPSULE | Freq: Every day | ORAL | Status: DC
  Administered 2020-05-16 – 2020-05-19 (×4): 0.8 mg via ORAL
  Filled 2020-05-16 (×4): qty 2

## 2020-05-16 MED ORDER — INSULIN ASPART 100 UNIT/ML ~~LOC~~ SOLN
0.0000 [IU] | Freq: Three times a day (TID) | SUBCUTANEOUS | Status: DC
  Filled 2020-05-16: qty 0.06

## 2020-05-16 MED ORDER — ADULT MULTIVITAMIN W/MINERALS CH
1.0000 | ORAL_TABLET | Freq: Every day | ORAL | Status: DC
  Administered 2020-05-16 – 2020-05-20 (×5): 1 via ORAL
  Filled 2020-05-16 (×5): qty 1

## 2020-05-16 NOTE — ED Provider Notes (Signed)
[facility] [facility] DEPT Provider Note   CSN: [id number] Arrival date & time: 05/16/20  1317     History Chief Complaint  Patient presents with  . Shortness of Breath    Ryan Jimenez is a 86 y.o. male.  HPI     86 year old male comes in w/ chief complaint of shortness of breath. 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  He comes in with shortness of breath that has been troubling him for 3 months.  He has seen his PCP in urgent care.  He has gone through a round of antibiotics and multiple rounds of steroids and nebulizer treatment.  He has not formally been diagnosed with any lung disease.  He reports that today he got very short of breath and called EMS.  In the last 2 or 3 weeks they have called EMS on 3 separate occasion, but this 1 was the worst and they decided to come to the ER.   Past Medical History:  Diagnosis Date  . Anemia   . Arthritis   . Bronchitis   . CKD (chronic kidney disease)    Stage 3/4  . Diabetes mellitus   . Dyspnea   . Dysrhythmia   . Glomerulonephritis   . Gout   . History of pneumonia   . History of right bundle branch block (RBBB)   . Hypercholesterolemia   . Hypertension   . Near syncope 10/2011; 11/2011  . Pancytopenia   . Pneumonia   . Renal disorder   . Shortness of breath on exertion   . Type 2 diabetes mellitus [facility])     Patient Active Problem List   Diagnosis Date Noted  . Acute respiratory failure ([place]) 05/16/2020  . CKD (chronic kidney disease) stage 3, GFR 30-59 ml/min 04/04/2015  . Chest pain 04/03/2015  . 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 Cerebral infarction ([place]) 04/23/2013  . Hidradenitis suppurativa of right axilla 04/05/2012  . Abscess of axillary region 04/04/2012  . Neutropenia 04/04/2012  . Pancytopenia due to chemotherapy ([place]) 04/04/2012  . Anemia 04/04/2012  . Generalized weakness 04/04/2012  . Physical deconditioning 04/04/2012  . Benign  positional vertigo 11/28/2011  . Hypertension 11/23/2011  . Renal failure 11/23/2011  . Type 2 diabetes mellitus ([place]) 11/23/2011  . Hypercholesterolemia     Past Surgical History:  Procedure Laterality Date  . AV FISTULA PLACEMENT Right 03/10/2020   Procedure: RIGHT BRACHIOCEPHALIC ARTERIOVENOUS (AV) FISTULA CREATION;  Surgeon: Elam Dutch, MD;  Location: [place];  Service: Vascular;  Laterality: Right;  . CATARACT EXTRACTION W/ INTRAOCULAR LENS IMPLANT  ~ 2009   right  . FISTULA SUPERFICIALIZATION Right 05/11/2020   Procedure: FISTULA REVISION, BRANCH LIGATION AND SUPERFICIALIZATION OF RIGHT UPPER ARM FISTULA;  Surgeon: Marty Heck, MD;  Location: [place];  Service: Vascular;  Laterality: Right;  . HYDROCELE EXCISION         Family History  Problem Relation Age of Onset  . Hypertension Mother   . Heart disease Mother   . Diabetes Sister     Social History   Tobacco Use  . Smoking status: Former Smoker    Packs/day: 1.00    Years: 40.00    Pack years: 40.00    Types: Cigarettes  . Smokeless tobacco: Never Used  Vaping Use  . Vaping Use: Never used  Substance Use Topics  . Alcohol use: Not Currently    Comment: 11/23/11 "couple ounces q month maybe"  . Drug use:  No    Home Medications Prior to Admission medications   Medication Sig Start Date End Date Taking? Authorizing Provider  albuterol (PROVENTIL) (2.5 MG/3ML) 0.083% nebulizer solution Take 3 mLs (2.5 mg total) by nebulization every 6 (six) hours as needed for wheezing or shortness of breath. 04/20/20  Yes Raylene Everts, MD  albuterol (VENTOLIN HFA) 108 (90 Base) MCG/ACT inhaler Inhale 2 puffs into the lungs every 6 (six) hours as needed (wheezing/cough).  03/04/20  Yes [provider]  allopurinol (ZYLOPRIM) 100 MG tablet Take 100 mg by mouth daily.   Yes [provider]  amLODipine (NORVASC) 10 MG tablet Take 10 mg by mouth daily.   Yes [provider]  aspirin EC 81 MG  tablet Take 81 mg by mouth daily.   Yes [provider]  benzonatate (TESSALON) 100 MG capsule Take 200 mg by mouth every 8 (eight) hours as needed for cough.  04/15/20  Yes [provider]  carvedilol (COREG) 12.5 MG tablet Take 1 tablet (12.5 mg total) by mouth 2 (two) times daily with a meal. Patient taking differently: Take 12.5 mg by mouth 2 (two) times daily with a meal. 10am and 10pm 05/02/13  Yes Angiulli, Lavon Paganini, PA-C  cetirizine (ZYRTEC) 10 MG tablet Take 10 mg by mouth daily as needed (cough).   Yes [provider]  colchicine 0.6 MG tablet Take 0.6 mg by mouth daily as needed (gout).    Yes [provider]  dutasteride (AVODART) 0.5 MG capsule Take 0.5 mg by mouth daily.   Yes [provider]  fluticasone (FLONASE) 50 MCG/ACT nasal spray Place 2 sprays into both nostrils daily. Patient taking differently: Place 2 sprays into both nostrils at bedtime.  05/29/18  Yes Yu, Amy V, PA-C  furosemide (LASIX) 40 MG tablet Take 40 mg by mouth daily.   Yes [provider]  hydrALAZINE (APRESOLINE) 25 MG tablet Take 25 mg by mouth 2 (two) times daily. 03/02/20  Yes [provider]  insulin aspart (NOVOLOG) 100 UNIT/ML injection Inject 4-10 Units into the skin 3 (three) times daily as needed for high blood sugar.    Yes [provider]  LEVEMIR FLEXTOUCH 100 UNIT/ML Pen Inject 30-45 Units into the skin at bedtime as needed (high blood sugar). Per sliding scale 01/30/15  Yes [provider]  losartan (COZAAR) 50 MG tablet Take 50 mg by mouth 2 (two) times daily.   Yes [provider]  mirtazapine (REMERON) 7.5 MG tablet Take 7.5 mg by mouth every evening. 04/29/20  Yes [provider]  Multiple Vitamin (MULTIVITAMIN WITH MINERALS) TABS tablet Take 1 tablet by mouth daily.   Yes [provider]  NOREL AD 4-10-325 MG TABS Take 1 tablet by mouth 2 (two) times daily as needed (pain/cough).  05/05/20  Yes  [provider]  oxyCODONE-acetaminophen (PERCOCET) 5-325 MG tablet Take 1 tablet by mouth every 6 (six) hours as needed for severe pain. 05/11/20  Yes Rhyne, Samantha J, PA-C  rosuvastatin (CRESTOR) 10 MG tablet Take 1 tablet (10 mg total) by mouth at bedtime. 05/02/13  Yes Angiulli, Lavon Paganini, PA-C  tamsulosin (FLOMAX) 0.4 MG CAPS Take 1 capsule (0.4 mg total) by mouth at bedtime. Patient taking differently: Take 0.8 mg by mouth at bedtime.  05/02/13  Yes Angiulli, Lavon Paganini, PA-C  Tetrahydroz-Dextran-PEG-Povid (EYE DROPS ADVANCED RELIEF OP) Place 1 drop into both eyes daily as needed (dry eyes).    Yes [provider]  Allergies    Clonidine derivatives  Review of Systems   Review of Systems  Constitutional: Positive for activity change.  Respiratory: Positive for shortness of breath and wheezing.   Cardiovascular: Negative for chest pain.  Genitourinary: Negative for dysuria.  Hematological: Does not bruise/bleed easily.  All other systems reviewed and are negative.   Physical Exam Updated Vital Signs BP (!) 141/65 (BP Location: Left Arm)   Pulse 77   Temp 98.5 F (36.9 C)   Resp 20   Ht 5\' 9"  (1.753 m)   Wt 116.1 kg   SpO2 96%   BMI 37.80 kg/m   Physical Exam  ED Results / Procedures / Treatments   Labs (all labs ordered are listed, but only abnormal results are displayed) Labs Reviewed  BASIC METABOLIC PANEL - Abnormal; Notable for the following components:      Result Value   Glucose, Bld 170 (*)    BUN 50 (*)    Creatinine, Ser 4.50 (*)    Calcium 8.3 (*)    GFR calc non Af Amer 11 (*)    GFR calc Af Amer 13 (*)    All other components within normal limits  CBC WITH DIFFERENTIAL/PLATELET - Abnormal; Notable for the following components:   RBC 3.34 (*)    Hemoglobin 9.0 (*)    HCT 29.0 (*)    All other components within normal limits  D-DIMER, QUANTITATIVE (NOT AT [facility]) - Abnormal; Notable for the following components:   D-Dimer, Quant 1.73  (*)    All other components within normal limits  BRAIN NATRIURETIC PEPTIDE - Abnormal; Notable for the following components:   B Natriuretic Peptide 292.6 (*)    All other components within normal limits  GLUCOSE, CAPILLARY - Abnormal; Notable for the following components:   Glucose-Capillary 274 (*)    All other components within normal limits  SARS CORONAVIRUS 2 BY RT PCR (HOSPITAL ORDER, [place] [facility])  COMPREHENSIVE METABOLIC PANEL  CBC  BRAIN NATRIURETIC PEPTIDE  HEMOGLOBIN A1C    EKG EKG Interpretation  Date/Time:  Saturday May 16 2020 13:34:53 EDT Ventricular Rate:  82 PR Interval:    QRS Duration: 145 QT Interval:  435 QTC Calculation: 509 R Axis:   91 Text Interpretation: Sinus rhythm RBBB and LPFB No acute changes No significant change since last tracing Confirmed by Varney Biles ([id number]) on 05/16/2020 2:38:52 PM   Radiology DG Chest 2 View  Result Date: 05/16/2020 CLINICAL DATA:  Shortness of breath EXAM: CHEST - 2 VIEW COMPARISON:  April 20, 2020 FINDINGS: The mediastinal contour and cardiac silhouette are stable. The heart size is mildly enlarged. Mild patchy opacity of right lung base is identified. Cephalization of flow is identified bilaterally. No frank pulmonary edema or pleural effusion is identified. The bony structures are stable. IMPRESSION: Mild patchy opacity of right lung base, developing pneumonia is not excluded. Central pulmonary vascular congestion without pulmonary edema. Electronically Signed   By: Abelardo Diesel M.D.   On: 05/16/2020 14:48   CT CHEST WO CONTRAST  Result Date: 05/16/2020 CLINICAL DATA:  Cardiac arrest. EXAM: CT CHEST WITHOUT CONTRAST TECHNIQUE: Multidetector CT imaging of the chest was performed following the standard protocol without IV contrast. COMPARISON:  Radiograph earlier today. FINDINGS: Cardiovascular: Aortic atherosclerosis. Branch vessel tortuosity. Upper normal heart size with coronary artery  calcifications. There is no pericardial effusion. Mediastinum/Nodes: Shotty mediastinal lymph nodes measuring up to 11 mm in the right lower paratracheal station. Limited assessment for hilar  adenopathy on noncontrast exam. No axillary adenopathy. Patulous esophagus with intraluminal density distally, consistent with ingested material. No visualized thyroid nodule. Lungs/Pleura: Mild emphysema. Small bilateral pleural effusions with compressive basilar atelectasis. Mild smooth septal thickening suggesting pulmonary edema. No evidence of pulmonary mass. No pneumothorax. Upper Abdomen: No acute finding. Moderate stool burden in the included colon. Diverticulosis at the splenic flexure. Punctate hepatic granuloma. Musculoskeletal: No sternal or anterior rib fracture. No acute osseous abnormalities. There is mild degenerative change in the spine. IMPRESSION: 1. Small bilateral pleural effusions with compressive basilar atelectasis. Mild smooth septal thickening suggesting pulmonary edema. 2. Shotty mediastinal lymph nodes are likely reactive. 3. Coronary artery calcifications and aortic atherosclerosis. Aortic Atherosclerosis (ICD10-I70.0) and Emphysema (ICD10-J43.9). Electronically Signed   By: Keith Rake M.D.   On: 05/16/2020 21:12   VAS [place] LOWER EXTREMITY VENOUS (DVT)  Result Date: 05/16/2020  Lower Venous DVTStudy Indications: Edema.  Limitations: Edema. Comparison Study: No prior study on file Performing Technologist: Sharion Dove RVS  Examination Guidelines: A complete evaluation includes B-mode imaging, spectral Doppler, color Doppler, and power Doppler as needed of all accessible portions of each vessel. Bilateral testing is considered an integral part of a complete examination. Limited examinations for reoccurring indications may be performed as noted. The reflux portion of the exam is performed with the patient in reverse Trendelenburg.   +---------+---------------+---------+-----------+----------+--------------+ RIGHT    CompressibilityPhasicitySpontaneityPropertiesThrombus Aging +---------+---------------+---------+-----------+----------+--------------+ CFV      Full           Yes      Yes                                 +---------+---------------+---------+-----------+----------+--------------+ SFJ      Full                                                        +---------+---------------+---------+-----------+----------+--------------+ FV Prox  Full                                                        +---------+---------------+---------+-----------+----------+--------------+ FV Mid   Full                                                        +---------+---------------+---------+-----------+----------+--------------+ FV DistalFull                                                        +---------+---------------+---------+-----------+----------+--------------+ PFV      Full                                                        +---------+---------------+---------+-----------+----------+--------------+  POP      Full           Yes      Yes                                 +---------+---------------+---------+-----------+----------+--------------+ PTV      Full                                                        +---------+---------------+---------+-----------+----------+--------------+ PERO                                                  Not visualized +---------+---------------+---------+-----------+----------+--------------+   +----+---------------+---------+-----------+----------+--------------+ LEFTCompressibilityPhasicitySpontaneityPropertiesThrombus Aging +----+---------------+---------+-----------+----------+--------------+ CFV Full           Yes      Yes                                  +----+---------------+---------+-----------+----------+--------------+     Summary: RIGHT: - There is no evidence of deep vein thrombosis in the lower extremity. However, portions of this examination were limited- see technologist comments above.  Interstitial fluid noted throughout calf  LEFT: - No evidence of common femoral vein obstruction. - Ultrasound characteristics of enlarged lymph nodes noted in the groin.  *See table(s) above for measurements and observations.    Preliminary     Procedures .Critical Care Performed by: Varney Biles, MD Authorized by: Varney Biles, MD   Critical care provider statement:    Critical care time (minutes):  40   Critical care was necessary to treat or prevent imminent or life-threatening deterioration of the following conditions:  Respiratory failure   Critical care was time spent personally by me on the following activities:  Discussions with consultants, evaluation of patient's response to treatment, examination of patient, ordering and performing treatments and interventions, ordering and review of laboratory studies, ordering and review of radiographic studies, pulse oximetry, re-evaluation of patient's condition, obtaining history from patient or surrogate and review of old charts   (including critical care time)  Medications Ordered in ED Medications  heparin injection 5,000 Units (5,000 Units Subcutaneous Given 05/16/20 2224)  acetaminophen (TYLENOL) tablet 650 mg (650 mg Oral Given 05/16/20 2217)    Or  acetaminophen (TYLENOL) suppository 650 mg ( Rectal See Alternative 05/16/20 2217)  senna-docusate (Senokot-S) tablet 1 tablet (has no administration in time range)  ondansetron (ZOFRAN) tablet 4 mg (has no administration in time range)    Or  ondansetron (ZOFRAN) injection 4 mg (has no administration in time range)  albuterol (PROVENTIL) (2.5 MG/3ML) 0.083% nebulizer solution 2.5 mg (has no administration in time range)  guaiFENesin (MUCINEX) 12  hr tablet 600 mg (600 mg Oral Given 05/16/20 2217)  insulin aspart (novoLOG) injection 0-6 Units (has no administration in time range)  insulin aspart (novoLOG) injection 0-5 Units (has no administration in time range)  ipratropium-albuterol (DUONEB) 0.5-2.5 (3) MG/3ML nebulizer solution 3 mL (3 mLs Nebulization Given 05/16/20 1955)  allopurinol (ZYLOPRIM) tablet 100 mg (has no administration in time range)  aspirin EC tablet 81 mg (81 mg  Oral Given 05/16/20 2009)  colchicine tablet 0.6 mg (has no administration in time range)  oxyCODONE-acetaminophen (PERCOCET/ROXICET) 5-325 MG per tablet 1 tablet (has no administration in time range)  amLODipine (NORVASC) tablet 10 mg (10 mg Oral Given 05/16/20 2009)  carvedilol (COREG) tablet 12.5 mg (12.5 mg Oral Given 05/16/20 2219)  hydrALAZINE (APRESOLINE) tablet 25 mg (25 mg Oral Given 05/16/20 2218)  losartan (COZAAR) tablet 50 mg (50 mg Oral Given 05/16/20 2223)  rosuvastatin (CRESTOR) tablet 10 mg (10 mg Oral Given 05/16/20 2219)  insulin detemir (LEVEMIR) injection 25 Units (has no administration in time range)  dutasteride (AVODART) capsule 0.5 mg (has no administration in time range)  tamsulosin (FLOMAX) capsule 0.8 mg (0.8 mg Oral Given 05/16/20 2218)  multivitamin with minerals tablet 1 tablet (1 tablet Oral Given 05/16/20 2009)  benzonatate (TESSALON) capsule 200 mg (200 mg Oral Given 05/16/20 2218)  loratadine (CLARITIN) tablet 10 mg (10 mg Oral Given 05/16/20 2010)  fluticasone (FLONASE) 50 MCG/ACT nasal spray 2 spray (has no administration in time range)  polyvinyl alcohol (LIQUIFILM TEARS) 1.4 % ophthalmic solution 1 drop (has no administration in time range)  mirtazapine (REMERON) tablet 7.5 mg (has no administration in time range)  albuterol (PROVENTIL,VENTOLIN) solution continuous neb (10 mg/hr Nebulization Given 05/16/20 1733)  methylPREDNISolone sodium succinate (SOLU-MEDROL) 125 mg/2 mL injection 125 mg (125 mg Intravenous Given 05/16/20 1808)  ipratropium  (ATROVENT) nebulizer solution 0.5 mg (0.5 mg Nebulization Given 05/16/20 1734)  furosemide (LASIX) injection 40 mg (40 mg Intravenous Given 05/16/20 2009)    ED Course  I have reviewed the triage vital signs and the nursing notes.  Pertinent labs & imaging results that were available during my care of the patient were reviewed by me and considered in my medical decision making (see chart for details).    MDM Rules/Calculators/A&P                          86 year old male comes in a chief complaint of shortness of breath.  He has history of CKD. Patient has Wegener's granulomatosis, that has affected his kidney.  He has not been diagnosed with any underlying lung issues.  He does not have a known history of CHF.  Patient does not have any risk factors for PE, DVT but he is noted to have right leg swollen more compared to the left side.  D-dimer was ordered and it is elevated, ultrasound DVT ordered and is negative for DVT.  I doubt there is underlying PE and will not order VQ scan.  Chest x-ray showing mild interstitial edema.  My clinical suspicion is that patient has worsening lung disease from his vasculitis and not new CHF or PE or Pneumonia. BNP not ordered as it will be elevated.  Final Clinical Impression(s) / ED Diagnoses Final diagnoses:  Acute hypoxemic respiratory failure [facility])    Rx / DC Orders ED Discharge Orders    None       Varney Biles, MD 05/16/20 [phone]

## 2020-05-16 NOTE — Progress Notes (Signed)
VASCULAR LAB    Right lower extremity venous duplex completed.    Preliminary report:  See CV proc for preliminary results.  Devone Bonilla, RVT 05/16/2020, 7:30 PM

## 2020-05-16 NOTE — H&P (Signed)
History and Physical    Ryan Jimenez MGQ:[id number] DOB: 1931-12-07 DOA: 05/16/2020  PCP: Nolene Ebbs, MD   Patient coming from: home  Chief Complaint: Shortness of breath  HPI: Ryan Jimenez is a 86 y.o. 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  Shortness of breath has been going on for 3 months had seen by PCP in urgent care and has gone through multiple rounds of antibiotics and steroids and nebulizer treatment.  Patient got very short of breath today and called EMS in the last 2 to 3 weeks have called EMS on 3 separate occasions. Prednisone did not help him much and sugar got in 500s. He does have leg swelling b/l LE and worse for baseline. Has cough with clear/ white phlegm. No report of chest pain, fever, vomiting, abdominal pain, focal weakness.  ED Course: Vitals stable was placed on oxygen and on room air desaturated at 88% per ED physician, blood work showed elevated BUN/creatinine at 4.5, EKG showed sinus rhythm RV no acute ischemic changes.  Chest x-ray showed mild patchy opacity of the right lung base developing pneumonia is not excluded, central pulmonary vascular congestion without pulmonary edema noted.  Patient was given Solu-Medrol 125 mg, bronchodilators nebulizer.  D-dimer was elevated 1.7, ED doctor felt low suspicion of PE, and had asymmetric swelling of the leg and duplex of the leg has been ordered. Given his acute on chronic dyspnea, new oxygen requirement admission was requested for further management and pulmonary evaluation  Review of Systems: All systems were reviewed and were negative except as mentioned in HPI above. Negative for fever Negative for  chest pain Negative for abdominal pain  Past Medical History:  Diagnosis Date  . Anemia   . Arthritis   . Bronchitis   . CKD (chronic kidney disease)    Stage 3/4  . Diabetes mellitus   . Dyspnea   . Dysrhythmia   . Glomerulonephritis   . Gout   . History of pneumonia   . History of right bundle branch block (RBBB)   . Hypercholesterolemia   . Hypertension   . Near syncope 10/2011; 11/2011  . Pancytopenia   . Pneumonia   . Renal disorder   . Shortness of breath on exertion   . Type 2 diabetes mellitus ([place])     Past Surgical History:  Procedure Laterality Date  . AV FISTULA PLACEMENT Right 03/10/2020   Procedure: RIGHT BRACHIOCEPHALIC ARTERIOVENOUS (AV) FISTULA CREATION;  Surgeon: Elam Dutch, MD;  Location: [place];  Service: Vascular;  Laterality: Right;  . CATARACT EXTRACTION W/ INTRAOCULAR LENS IMPLANT  ~ 2009   right  . FISTULA SUPERFICIALIZATION Right 05/11/2020   Procedure: FISTULA REVISION, BRANCH LIGATION AND SUPERFICIALIZATION OF RIGHT UPPER ARM FISTULA;  Surgeon: Marty Heck, MD;  Location: [place];  Service: Vascular;  Laterality: Right;  . HYDROCELE EXCISION       reports that he has quit smoking. His smoking use included cigarettes. He has a 40.00 pack-year smoking history. He has never used smokeless tobacco. He reports previous alcohol use. He reports that he does not use drugs.  Allergies  Allergen Reactions  . Clonidine Derivatives Other (See Comments)    Not known    Family History  Problem Relation Age of  Onset  . Hypertension Mother   . Heart disease Mother   . Diabetes Sister      Prior to Admission medications   Medication Sig Start Date End Date Taking? Authorizing Provider  albuterol (PROVENTIL) (2.5 MG/3ML) 0.083% nebulizer solution Take 3 mLs (2.5 mg total) by nebulization every 6 (six) hours as needed for wheezing or shortness of breath. 04/20/20  Yes Raylene Everts, MD  albuterol (VENTOLIN HFA) 108 (90 Base) MCG/ACT  inhaler Inhale 2 puffs into the lungs every 6 (six) hours as needed (wheezing/cough).  03/04/20  Yes [provider]  allopurinol (ZYLOPRIM) 100 MG tablet Take 100 mg by mouth daily.   Yes [provider]  amLODipine (NORVASC) 10 MG tablet Take 10 mg by mouth daily.   Yes [provider]  aspirin EC 81 MG tablet Take 81 mg by mouth daily.   Yes [provider]  benzonatate (TESSALON) 100 MG capsule Take 200 mg by mouth every 8 (eight) hours as needed for cough.  04/15/20  Yes [provider]  carvedilol (COREG) 12.5 MG tablet Take 1 tablet (12.5 mg total) by mouth 2 (two) times daily with a meal. Patient taking differently: Take 12.5 mg by mouth 2 (two) times daily with a meal. 10am and 10pm 05/02/13  Yes Angiulli, Lavon Paganini, PA-C  cetirizine (ZYRTEC) 10 MG tablet Take 10 mg by mouth daily as needed (cough).   Yes [provider]  colchicine 0.6 MG tablet Take 0.6 mg by mouth daily as needed (gout).    Yes [provider]  dutasteride (AVODART) 0.5 MG capsule Take 0.5 mg by mouth daily.   Yes [provider]  fluticasone (FLONASE) 50 MCG/ACT nasal spray Place 2 sprays into both nostrils daily. Patient taking differently: Place 2 sprays into both nostrils at bedtime.  05/29/18  Yes Yu, Amy V, PA-C  furosemide (LASIX) 40 MG tablet Take 40 mg by mouth daily.   Yes [provider]  hydrALAZINE (APRESOLINE) 25 MG tablet Take 25 mg by mouth 2 (two) times daily. 03/02/20  Yes [provider]  insulin aspart (NOVOLOG) 100 UNIT/ML injection Inject 4-10 Units into the skin 3 (three) times daily as needed for high blood sugar.    Yes [provider]  LEVEMIR FLEXTOUCH 100 UNIT/ML Pen Inject 30-45 Units into the skin at bedtime as needed (high blood sugar). Per sliding scale 01/30/15  Yes [provider]  losartan (COZAAR) 50 MG tablet Take 50 mg by mouth 2 (two) times daily.   Yes [provider]    mirtazapine (REMERON) 7.5 MG tablet Take 7.5 mg by mouth every evening. 04/29/20  Yes [provider]  Multiple Vitamin (MULTIVITAMIN WITH MINERALS) TABS tablet Take 1 tablet by mouth daily.   Yes [provider]  NOREL AD 4-10-325 MG TABS Take 1 tablet by mouth 2 (two) times daily as needed (pain/cough).  05/05/20  Yes [provider]  oxyCODONE-acetaminophen (PERCOCET) 5-325 MG tablet Take 1 tablet by mouth every 6 (six) hours as needed for severe pain. 05/11/20  Yes Rhyne, Samantha J, PA-C  rosuvastatin (CRESTOR) 10 MG tablet Take 1 tablet (10 mg total) by mouth at bedtime. 05/02/13  Yes Angiulli, Lavon Paganini, PA-C  tamsulosin (FLOMAX) 0.4 MG CAPS Take 1 capsule (0.4 mg total) by mouth at bedtime. Patient taking differently: Take 0.8 mg by mouth at bedtime.  05/02/13  Yes Angiulli, Lavon Paganini, PA-C  Tetrahydroz-Dextran-PEG-Povid (EYE DROPS ADVANCED RELIEF OP) Place 1 drop into both  eyes daily as needed (dry eyes).    Yes [provider]    Physical Exam: Vitals:   05/16/20 1335 05/16/20 1445 05/16/20 1515  BP: (!) 143/61 136/64 (!) 145/64  Pulse: 82 69 70  Resp: (!) 25 (!) 22 (!) 21  Temp: 98.5 F (36.9 C)    TempSrc: Oral    SpO2: 100% 98% 93%    General exam: AAOx3, obese,, NAD, weak appearing. HEENT:Oral mucosa moist, Ear/Nose WNL grossly, dentition normal. Respiratory system: bilaterally diminished, mild crackles,no wheezing or crackles,no use of accessory muscle Cardiovascular system: S1 & S2 +, No JVD,. Gastrointestinal system: Abdomen soft,obese, NT,ND, BS+ Nervous System:Alert, awake, moving extremities and grossly nonfocal Extremities: pitting edema up to knee in b/l LE, distal peripheral pulses palpable.  Skin: No rashes,no icterus. MSK: Normal muscle bulk,tone, power   Labs on Admission: I have personally reviewed following labs and imaging studies  CBC: Recent Labs  Lab 05/11/20 0631 05/16/20 1548  WBC  --  7.8  NEUTROABS  --  5.4   HGB 10.5* 9.0*  HCT 31.0* 29.0*  MCV  --  86.8  PLT  --  622   Basic Metabolic Panel: Recent Labs  Lab 05/11/20 0631 05/16/20 1548  NA 141 137  K 4.2 4.7  CL 101 99  CO2  --  28  GLUCOSE 199* 170*  BUN 46* 50*  CREATININE 5.20* 4.50*  CALCIUM  --  8.3*   GFR: Estimated Creatinine Clearance: 15 mL/min (A) (by C-G formula based on SCr of 4.5 mg/dL (H)). Liver Function Tests: No results for input(s): AST, ALT, ALKPHOS, BILITOT, PROT, ALBUMIN in the last 168 hours. No results for input(s): LIPASE, AMYLASE in the last 168 hours. No results for input(s): AMMONIA in the last 168 hours. Coagulation Profile: No results for input(s): INR, PROTIME in the last 168 hours. Cardiac Enzymes: No results for input(s): CKTOTAL, CKMB, CKMBINDEX, TROPONINI in the last 168 hours. BNP (last 3 results) No results for input(s): PROBNP in the last 8760 hours. HbA1C: No results for input(s): HGBA1C in the last 72 hours. CBG: Recent Labs  Lab 05/11/20 0626 05/11/20 0914  GLUCAP 202* 214*   Lipid Profile: No results for input(s): CHOL, HDL, LDLCALC, TRIG, CHOLHDL, LDLDIRECT in the last 72 hours. Thyroid Function Tests: No results for input(s): TSH, T4TOTAL, FREET4, T3FREE, THYROIDAB in the last 72 hours. Anemia Panel: No results for input(s): VITAMINB12, FOLATE, FERRITIN, TIBC, IRON, RETICCTPCT in the last 72 hours. Urine analysis:    Component Value Date/Time   COLORURINE GREEN (A) 07/24/2019 1329   APPEARANCEUR CLEAR 07/24/2019 1329   LABSPEC 1.011 07/24/2019 1329   PHURINE 6.0 07/24/2019 1329   GLUCOSEU NEGATIVE 07/24/2019 1329   HGBUR NEGATIVE 07/24/2019 1329   BILIRUBINUR NEGATIVE 07/24/2019 1329   KETONESUR NEGATIVE 07/24/2019 1329   PROTEINUR >=300 (A) 07/24/2019 1329   UROBILINOGEN 0.2 04/22/2013 2301   NITRITE POSITIVE (A) 07/24/2019 1329   LEUKOCYTESUR SMALL (A) 07/24/2019 1329    Radiological Exams on Admission: DG Chest 2 View  Result Date: 05/16/2020 CLINICAL DATA:   Shortness of breath EXAM: CHEST - 2 VIEW COMPARISON:  April 20, 2020 FINDINGS: The mediastinal contour and cardiac silhouette are stable. The heart size is mildly enlarged. Mild patchy opacity of right lung base is identified. Cephalization of flow is identified bilaterally. No frank pulmonary edema or pleural effusion is identified. The bony structures are stable. IMPRESSION: Mild patchy opacity of right lung base, developing pneumonia is not excluded. Central pulmonary vascular  congestion without pulmonary edema. Electronically Signed   By: Abelardo Diesel M.D.   On: 05/16/2020 14:48     Assessment/Plan  Acute respiratory failure with ongoing chronic dyspnea x 3 months with worsening in last few days. Unclear etiology. Has ? chronic bronchitis  Has abt 40-50 ppd quit 20 yrs ago. no formal pulmonary evaluation in the past.  proBNP pending, chest x-ray shows pulmonary congestion, given his CKD may suspect component of fluid overload with his leg edema which are worse from baseline per daughter. Recently treated with steroid 3 wks ago, had antibiotics a month ago. Follow BNP, check echocardiogram.get ct chest w/o, may need pulmonary evaluation. On po lasix- will change to iv for now give his leg edema x1 today and assess him- and get nephro to see him as well- he sees Dr Posey Pronto. Had elevated D-dimer Duplex ordered will follow, .Patient is on low flow nasal cannula at 2 L, no chest pain no low suspicion of PE.    Hypercholesterolemia: Resume statin. Hypertension: BP controlled resume home meds- on multiple meds  CKd stage III/IV baseline creatinine 4.5 BUN in 50s. :  Renal function more or less stable.  It appears he had recent fistula surgery done to elevated due to slow maturation 5 days ago. He does have pulm congestion as well as worsening leg edema suspect fluid overload. Will d/w his nephro in am.  Type 2 diabetes mellitus on long-term insulin we will keep the patient sliding scale insulin for now and  Levemir at slightly lower dose. Recent A1c has been stable at 7.5 or below.  Hemoglobin A1c ORDERED. Hold off steroid as he is very sensitive to prednisone per daughter.  GYJ:EHUDJS Flomax/Avodart  Physical deconditioning: PT OT eval  There is no height or weight on file to calculate BMI.   Severity of Illness: * I certify that at the point of admission it is my clinical judgment that the patient will require inpatient hospital care spanning beyond 2 midnights from the point of admission due to high intensity of service, high risk for further deterioration and high frequency of surveillance required continue with onset oxygen requirement, acute on chronic dyspnea, in the setting of CKD    DVT prophylaxis: heparin Code Status: Full code Family Communication: Admission, patients condition and plan of care including tests being ordered have been discussed with the patient and his daughter who indicate understanding and agree with the plan and Code Status.  Consults called:none  Antonieta Pert MD [facility]  If 7PM-7AM, please contact night-coverage [email or web address]  05/16/2020, 5:44 PM

## 2020-05-16 NOTE — ED Triage Notes (Signed)
Per EMS:  Patient has a hx of chronic bronchitis and he has been getting breathing treatments. Patient reports wheezing and tripoding. Patient reportedly has had not home medications today for his diabetes or HTN. Patient given an Albuterol tx with atrovent by EMS and reports he feels like he is improving slightly.

## 2020-05-16 NOTE — Progress Notes (Signed)
Peak flow best of 3 150.

## 2020-05-17 ENCOUNTER — Inpatient Hospital Stay (HOSPITAL_COMMUNITY): Payer: Medicare Other

## 2020-05-17 DIAGNOSIS — R0602 Shortness of breath: Secondary | ICD-10-CM

## 2020-05-17 LAB — HEMOGLOBIN A1C
Hgb A1c MFr Bld: 8.1 % — ABNORMAL HIGH (ref 4.8–5.6)
Mean Plasma Glucose: 185.77 mg/dL

## 2020-05-17 LAB — ECHOCARDIOGRAM COMPLETE
Height: 69 in
Weight: 4310.43 oz

## 2020-05-17 LAB — CBC
HCT: 29.3 % — ABNORMAL LOW (ref 39.0–52.0)
Hemoglobin: 9.1 g/dL — ABNORMAL LOW (ref 13.0–17.0)
MCH: 27.2 pg (ref 26.0–34.0)
MCHC: 31.1 g/dL (ref 30.0–36.0)
MCV: 87.7 fL (ref 80.0–100.0)
Platelets: 213 10*3/uL (ref 150–400)
RBC: 3.34 MIL/uL — ABNORMAL LOW (ref 4.22–5.81)
RDW: 14.7 % (ref 11.5–15.5)
WBC: 4.4 10*3/uL (ref 4.0–10.5)
nRBC: 0 % (ref 0.0–0.2)

## 2020-05-17 LAB — COMPREHENSIVE METABOLIC PANEL
ALT: 9 U/L (ref 0–44)
AST: 14 U/L — ABNORMAL LOW (ref 15–41)
Albumin: 3.2 g/dL — ABNORMAL LOW (ref 3.5–5.0)
Alkaline Phosphatase: 49 U/L (ref 38–126)
Anion gap: 10 (ref 5–15)
BUN: 51 mg/dL — ABNORMAL HIGH (ref 8–23)
CO2: 25 mmol/L (ref 22–32)
Calcium: 8.3 mg/dL — ABNORMAL LOW (ref 8.9–10.3)
Chloride: 100 mmol/L (ref 98–111)
Creatinine, Ser: 5.01 mg/dL — ABNORMAL HIGH (ref 0.61–1.24)
GFR calc Af Amer: 11 mL/min — ABNORMAL LOW (ref >60)
GFR calc non Af Amer: 10 mL/min — ABNORMAL LOW (ref >60)
Glucose, Bld: 339 mg/dL — ABNORMAL HIGH (ref 70–99)
Potassium: 5.3 mmol/L — ABNORMAL HIGH (ref 3.5–5.1)
Sodium: 135 mmol/L (ref 135–145)
Total Bilirubin: 0.7 mg/dL (ref 0.3–1.2)
Total Protein: 6.8 g/dL (ref 6.5–8.1)

## 2020-05-17 LAB — GLUCOSE, CAPILLARY
Glucose-Capillary: 281 mg/dL — ABNORMAL HIGH (ref 70–99)
Glucose-Capillary: 281 mg/dL — ABNORMAL HIGH (ref 70–99)
Glucose-Capillary: 260 mg/dL — ABNORMAL HIGH (ref 70–99)
Glucose-Capillary: 270 mg/dL — ABNORMAL HIGH (ref 70–99)
Glucose-Capillary: 320 mg/dL — ABNORMAL HIGH (ref 70–99)

## 2020-05-17 LAB — BRAIN NATRIURETIC PEPTIDE: B Natriuretic Peptide: 214.7 pg/mL — ABNORMAL HIGH (ref 0.0–100.0)

## 2020-05-17 MED ORDER — ORAL CARE MOUTH RINSE
15.0000 mL | Freq: Two times a day (BID) | OROMUCOSAL | Status: DC
  Administered 2020-05-17 – 2020-05-19 (×4): 15 mL via OROMUCOSAL

## 2020-05-17 MED ORDER — INSULIN ASPART 100 UNIT/ML ~~LOC~~ SOLN
0.0000 [IU] | Freq: Three times a day (TID) | SUBCUTANEOUS | Status: DC
  Administered 2020-05-17 (×3): 8 [IU] via SUBCUTANEOUS
  Administered 2020-05-18: 5 [IU] via SUBCUTANEOUS
  Administered 2020-05-18: 2 [IU] via SUBCUTANEOUS
  Administered 2020-05-18: 3 [IU] via SUBCUTANEOUS

## 2020-05-17 MED ORDER — FUROSEMIDE 10 MG/ML IJ SOLN
120.0000 mg | Freq: Two times a day (BID) | INTRAVENOUS | Status: DC
  Administered 2020-05-17 – 2020-05-19 (×5): 120 mg via INTRAVENOUS
  Filled 2020-05-17 (×4): qty 10
  Filled 2020-05-17: qty 2

## 2020-05-17 MED ORDER — PERFLUTREN LIPID MICROSPHERE
1.0000 mL | INTRAVENOUS | Status: AC | PRN
  Administered 2020-05-17: 2 mL via INTRAVENOUS
  Filled 2020-05-17: qty 10

## 2020-05-17 NOTE — Evaluation (Signed)
Physical Therapy Evaluation Patient Details Name: Ryan Jimenez MRN: [id number] DOB: December 20, 1931 Today's Date: 05/17/2020   History of Present Illness  86 y.o. 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  Clinical Impression  Pt admitted as above and presenting with functional mobility limitations 2* generalized LE weakness, ambulatory balance deficits, obesity and SOB with min exertion.  Pt should progress to dc home with assist of family.      Follow Up Recommendations Home health PT    Equipment Recommendations  None recommended by PT    Recommendations for Other Services       Precautions / Restrictions Precautions Precautions: Fall Precaution Comments: monitor o2, limited endurance Restrictions Weight Bearing Restrictions: No      Mobility  Bed Mobility Overal bed mobility: Modified Independent             General bed mobility comments: Used bed rail to transfer to side of bed.  Transfers Overall transfer level: Needs assistance Equipment used: Rolling walker (2 wheeled) Transfers: Sit to/from Stand Sit to Stand: Min guard         General transfer comment: cues for use of UEs to self assist; steady assist only  Ambulation/Gait Ambulation/Gait assistance: Min guard Gait Distance (Feet): 60 Feet Assistive device: Rolling walker (2 wheeled) Gait Pattern/deviations: Step-to pattern;Step-through pattern;Decreased step length - right;Decreased step length - left;Shuffle;Trunk flexed;Wide base of support Gait velocity: decr   General Gait Details: wide BOS with min knee flex on swing through; pt with limited endurance and requiring one standing rest break  to complete task of ambulating 60'   Stairs            Wheelchair Mobility    Modified Rankin (Stroke Patients Only)       Balance Overall balance assessment: Needs assistance Sitting-balance support: No upper extremity supported;Feet supported Sitting balance-Leahy Scale: Good     Standing balance support: No upper extremity supported Standing balance-Leahy Scale: Fair Standing balance comment: wide BOS                             Pertinent Vitals/Pain Pain Assessment: No/denies pain    Home Living Family/patient expects to be discharged to:: Private residence Living Arrangements: Children Available Help at Discharge: Available 24 hours/day Type of Home: House Home Access: Stairs to enter Entrance Stairs-Rails: None Entrance Stairs-Number of Steps: 3 Home Layout: One level Home Equipment: Environmental consultant - 2 wheels;Walker - 4 wheels;Shower seat Additional Comments: Patient splits between [place] and [place]. TO return to [place] soon.    Prior Function Level of Independence: Independent with assistive device(s)         Comments: Ambulates with rollator. Reports having assistance with donning socks and shoes.     Hand Dominance   Dominant Hand: Right    Extremity/Trunk Assessment   Upper Extremity Assessment Upper Extremity Assessment: Overall WFL for tasks assessed    Lower Extremity Assessment Lower Extremity Assessment: Generalized weakness    Cervical / Trunk Assessment Cervical / Trunk Assessment: Kyphotic  Communication   Communication: Other (comment) (At times hard to understand.)  Cognition Arousal/Alertness: Awake/alert Behavior During Therapy: WFL for tasks assessed/performed Overall Cognitive Status: Within Functional Limits for tasks assessed  General Comments      Exercises     Assessment/Plan    PT Assessment Patient needs continued PT services  PT Problem List  Decreased strength;Decreased activity tolerance;Decreased balance;Decreased mobility;Decreased knowledge of use of DME;Obesity       PT Treatment Interventions DME instruction;Gait training;Stair training;Functional mobility training;Therapeutic activities;Therapeutic exercise;Balance training;Patient/family education    PT Goals (Current goals can be found in the Care Plan section)  Acute Rehab PT Goals Patient Stated Goal: Regain IND with improved endurance PT Goal Formulation: With patient Time For Goal Achievement: 05/31/20 Potential to Achieve Goals: Fair    Frequency Min 3X/week   Barriers to discharge        Co-evaluation PT/OT/SLP Co-Evaluation/Treatment: Yes Reason for Co-Treatment: To address functional/ADL transfers PT goals addressed during session: Mobility/safety with mobility OT goals addressed during session: ADL's and self-care       AM-PAC PT "6 Clicks" Mobility  Outcome Measure Help needed turning from your back to your side while in a flat bed without using bedrails?: None Help needed moving from lying on your back to sitting on the side of a flat bed without using bedrails?: None Help needed moving to and from a bed to a chair (including a wheelchair)?: A Little Help needed standing up from a chair using your arms (e.g., wheelchair or bedside chair)?: A Little Help needed to walk in hospital room?: A Little Help needed climbing 3-5 steps with a railing? : A Lot 6 Click Score: 19    End of Session Equipment Utilized During Treatment: Gait belt;Oxygen Activity Tolerance: Patient limited by fatigue Patient left: in chair;with call bell/phone within reach;with chair alarm set;with nursing/sitter in room;with family/visitor present Nurse Communication: Mobility status PT Visit Diagnosis: Unsteadiness on feet (R26.81);Difficulty in walking, not elsewhere classified (R26.2)    Time: 4967-5916 PT Time Calculation (min) (ACUTE ONLY): 25 min   Charges:   PT  Evaluation $PT Eval Low Complexity: 1 Low         [facility] Pager [phone] Office [phone]    Rilee Knoll 05/17/2020, 3:24 PM

## 2020-05-17 NOTE — Evaluation (Signed)
Occupational Therapy Evaluation Patient Details Name: Ryan Jimenez MRN: [id number] DOB: Oct 20, 1932 Today's Date: 05/17/2020    History of Present Illness 86 y.o. 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   Clinical Impression   [name] is an 86 year old man admitted to hospital with shortness of breath. On evaluation he demonstrates good strength but poor activity tolerance demonstrated limited ability to ambulate and perform prolonged standing for ADLs. Patient currently on 4L oxygen and usually does not have oxygen at home. Patient will benefit from skilled OT services while in hospital to improve deficits in order to return home at discharge.    Follow Up Recommendations  No OT follow up    Equipment Recommendations  None recommended by OT    Recommendations for Other Services       Precautions / Restrictions Precautions Precaution Comments: monitor o2, limited endurance Restrictions Weight Bearing Restrictions: No      Mobility Bed Mobility Overal bed mobility: Modified Independent             General bed mobility comments: Used bed rail to transfer to side of bed.  Transfers Overall transfer level: Needs assistance Equipment used: Rolling walker (2 wheeled) Transfers: Sit to/from Stand Sit to Stand: Min guard         General transfer comment: Min guard for standing. verbal cues for hand placmeent and walker management. Patient ambulated in hall on 4L [place]. O2 sats WNL. However, patient exhibited decreased activity tolerance and reported "weak legs."    Balance Overall balance assessment: Mild deficits observed, not formally tested                                          ADL either performed or assessed with clinical judgement   ADL Overall ADL's : Needs assistance/impaired Eating/Feeding: Independent   Grooming: Set up;Sitting   Upper Body Bathing: Set up;Sitting   Lower Body Bathing: Minimal assistance;Set up;Sit to/from stand   Upper Body Dressing : Set up;Sitting   Lower Body Dressing: Moderate assistance;Sit to/from stand   Toilet Transfer: Min guard;BSC;Stand-pivot   Toileting- Water quality scientist and Hygiene: Min guard;Sit to/from stand       Functional mobility during ADLs: Min guard;Rolling walker       Vision   Vision Assessment?: No apparent visual deficits     Perception     Praxis      Pertinent Vitals/Pain Pain Assessment: No/denies pain     Hand Dominance Right   Extremity/Trunk Assessment Upper Extremity Assessment Upper Extremity Assessment: Overall WFL for tasks assessed   Lower Extremity Assessment Lower Extremity Assessment: Defer to PT evaluation   Cervical / Trunk Assessment Cervical / Trunk Assessment: Kyphotic   Communication Communication Communication: Other (comment) (At times hard to understand.)   Cognition Arousal/Alertness: Awake/alert Behavior During Therapy: WFL for tasks assessed/performed Overall Cognitive Status: Within Functional Limits for tasks assessed                                     General Comments       Exercises  Shoulder Instructions      Home Living Family/patient expects to be discharged to:: Private residence Living Arrangements: Children Available Help at Discharge: Available 24 hours/day Type of Home: House Home Access: Stairs to enter [employer] of Steps: 3   Home Layout: One level     Bathroom Shower/Tub: Teacher, early years/pre: Standard Bathroom Accessibility: Yes   Home Equipment: Environmental consultant - 2 wheels;Walker - 4 wheels;Shower seat   Additional Comments:  Patient splits between [place] and [place]. TO return to [place] soon.      Prior Functioning/Environment Level of Independence: Independent with assistive device(s)        Comments: Ambulates with rollator. Reports having assistance with donning socks and shoes.        OT Problem List: Decreased activity tolerance;Decreased knowledge of use of DME or AE      OT Treatment/Interventions: Self-care/ADL training;Therapeutic exercise;Therapeutic activities;Patient/family education    OT Goals(Current goals can be found in the care plan section)    OT Frequency: Min 2X/week   Barriers to D/C:            Co-evaluation PT/OT/SLP Co-Evaluation/Treatment: Yes Reason for Co-Treatment: To address functional/ADL transfers;For patient/therapist safety (co-eval) PT goals addressed during session: Mobility/safety with mobility OT goals addressed during session: ADL's and self-care      AM-PAC OT "6 Clicks" Daily Activity     Outcome Measure Help from another person eating meals?: None Help from another person taking care of personal grooming?: A Little Help from another person toileting, which includes using toliet, bedpan, or urinal?: A Little Help from another person bathing (including washing, rinsing, drying)?: A Little Help from another person to put on and taking off regular upper body clothing?: A Little Help from another person to put on and taking off regular lower body clothing?: A Lot 6 Click Score: 18   End of Session Equipment Utilized During Treatment: Gait belt;Rolling walker;Oxygen Nurse Communication:  (okay to see per RN)  Activity Tolerance: Patient limited by fatigue Patient left: in chair;with call bell/phone within reach;with family/visitor present;with chair alarm set  OT Visit Diagnosis: Muscle weakness (generalized) (M62.81)                Time: 7711-6579 OT Time Calculation (min): 22 min Charges:  OT General Charges $OT Visit: 1 Visit OT Evaluation $OT  Eval Low Complexity: 1 Low  Brenner Visconti, OTR/L [place]  Office [phone] Pager: [place] 05/17/2020, 1:54 PM

## 2020-05-17 NOTE — Consult Note (Signed)
Renal Service Consult Note [place] 05/17/2020 Sol Blazing Requesting Physician:  Dr Maren Beach, [facility]   Reason for Consult:  Renal failure HPI: The patient is a 86 y.o. year-old w/ hx of DM2, CKD, HTN, HL, RBBB, gout, AVF placement April 2021 who presented to ED w/ SOB.  Going on for about 3 mos, seen by PCP and tried abx, steroids, nebs.  +leg swelling bilat LE's. +cough non productive. No CP or fevers. W/U in ED showed creat 4.5, vasc congestion/ CM on CXR and some pulm edema changes on CT chest. Asked to see for CKD.   Pt seen in room. SOB w/o prod cough or hemoptysis (hx Wegener's). +leg swelling. +"no appetite", gets sick at sight of food but no vomiting, just anorexia. +fatigue. No confusion or jerking.   Has seen Dr Posey Pronto at [facility] > 10 yrs, hx Wegener's per the patient. Says that his plan was to move back to [place] this week (has been here since April 2020, stayed longer than expected due to family member w/ COVID problems). Spoke w/ pt's daughter, they have PCP appt on 7/8 later this week in [place] and then they will be referred to a kidney doctor. Per the daughter, they have a f/u AVF appt (virtual) in about 3 wks with [facility] and they told her they would recommend waiting 4 wks minimum.   Access surgeries: 03/10/20 - creation of R BC AVF by [facility] 05/11/20 - slow to mature right upper extremity brachiocephalic AV fistula, underwent AV fistula revision with sidebranch ligation x6 and superficialization  ROS  denies CP  no joint pain   no HA  no blurry vision  no rash  no diarrhea  no nausea/ vomiting  no dysuria  no difficulty voiding  no change in urine color    Past Medical History  Past Medical History:  Diagnosis Date  . Anemia   . Arthritis   . Bronchitis   . CKD (chronic kidney disease)    Stage 3/4  . Diabetes mellitus   . Dyspnea   . Dysrhythmia   . Glomerulonephritis   . Gout   . History of pneumonia   . History of right bundle branch  block (RBBB)   . Hypercholesterolemia   . Hypertension   . Near syncope 10/2011; 11/2011  . Pancytopenia   . Pneumonia   . Renal disorder   . Shortness of breath on exertion   . Type 2 diabetes mellitus [facility])    Past Surgical History  Past Surgical History:  Procedure Laterality Date  . AV FISTULA PLACEMENT Right 03/10/2020   Procedure: RIGHT BRACHIOCEPHALIC ARTERIOVENOUS (AV) FISTULA CREATION;  Surgeon: Elam Dutch, MD;  Location: [place];  Service: Vascular;  Laterality: Right;  . CATARACT EXTRACTION W/ INTRAOCULAR LENS IMPLANT  ~ 2009   right  . FISTULA SUPERFICIALIZATION Right 05/11/2020   Procedure: FISTULA REVISION, BRANCH LIGATION AND SUPERFICIALIZATION OF RIGHT UPPER ARM FISTULA;  Surgeon: Marty Heck, MD;  Location: [place];  Service: Vascular;  Laterality: Right;  . HYDROCELE EXCISION     Family History  Family History  Problem Relation Age of Onset  . Hypertension Mother   . Heart disease Mother   . Diabetes Sister    Social History  reports that he has quit smoking. His smoking use included cigarettes. He has a 40.00 pack-year smoking history. He has never used smokeless tobacco. He reports previous alcohol use. He reports that he does not use  drugs. Allergies  Allergies  Allergen Reactions  . Clonidine Derivatives Other (See Comments)    Not known   Home medications Prior to Admission medications   Medication Sig Start Date End Date Taking? Authorizing Provider  albuterol (PROVENTIL) (2.5 MG/3ML) 0.083% nebulizer solution Take 3 mLs (2.5 mg total) by nebulization every 6 (six) hours as needed for wheezing or shortness of breath. 04/20/20  Yes Raylene Everts, MD  albuterol (VENTOLIN HFA) 108 (90 Base) MCG/ACT inhaler Inhale 2 puffs into the lungs every 6 (six) hours as needed (wheezing/cough).  03/04/20  Yes [provider]  allopurinol (ZYLOPRIM) 100 MG tablet Take 100 mg by mouth daily.   Yes [provider]  amLODipine (NORVASC) 10 MG  tablet Take 10 mg by mouth daily.   Yes [provider]  aspirin EC 81 MG tablet Take 81 mg by mouth daily.   Yes [provider]  benzonatate (TESSALON) 100 MG capsule Take 200 mg by mouth every 8 (eight) hours as needed for cough.  04/15/20  Yes [provider]  carvedilol (COREG) 12.5 MG tablet Take 1 tablet (12.5 mg total) by mouth 2 (two) times daily with a meal. Patient taking differently: Take 12.5 mg by mouth 2 (two) times daily with a meal. 10am and 10pm 05/02/13  Yes Angiulli, Lavon Paganini, PA-C  cetirizine (ZYRTEC) 10 MG tablet Take 10 mg by mouth daily as needed (cough).   Yes [provider]  colchicine 0.6 MG tablet Take 0.6 mg by mouth daily as needed (gout).    Yes [provider]  dutasteride (AVODART) 0.5 MG capsule Take 0.5 mg by mouth daily.   Yes [provider]  fluticasone (FLONASE) 50 MCG/ACT nasal spray Place 2 sprays into both nostrils daily. Patient taking differently: Place 2 sprays into both nostrils at bedtime.  05/29/18  Yes Yu, Amy V, PA-C  furosemide (LASIX) 40 MG tablet Take 40 mg by mouth daily.   Yes [provider]  hydrALAZINE (APRESOLINE) 25 MG tablet Take 25 mg by mouth 2 (two) times daily. 03/02/20  Yes [provider]  insulin aspart (NOVOLOG) 100 UNIT/ML injection Inject 4-10 Units into the skin 3 (three) times daily as needed for high blood sugar.    Yes [provider]  LEVEMIR FLEXTOUCH 100 UNIT/ML Pen Inject 30-45 Units into the skin at bedtime as needed (high blood sugar). Per sliding scale 01/30/15  Yes [provider]  losartan (COZAAR) 50 MG tablet Take 50 mg by mouth 2 (two) times daily.   Yes [provider]  mirtazapine (REMERON) 7.5 MG tablet Take 7.5 mg by mouth every evening. 04/29/20  Yes [provider]  Multiple Vitamin (MULTIVITAMIN WITH MINERALS) TABS tablet Take 1 tablet by mouth daily.   Yes [provider]  NOREL AD 4-10-325 MG  TABS Take 1 tablet by mouth 2 (two) times daily as needed (pain/cough).  05/05/20  Yes [provider]  oxyCODONE-acetaminophen (PERCOCET) 5-325 MG tablet Take 1 tablet by mouth every 6 (six) hours as needed for severe pain. 05/11/20  Yes Rhyne, Samantha J, PA-C  rosuvastatin (CRESTOR) 10 MG tablet Take 1 tablet (10 mg total) by mouth at bedtime. 05/02/13  Yes Angiulli, Lavon Paganini, PA-C  tamsulosin (FLOMAX) 0.4 MG CAPS Take 1 capsule (0.4 mg total) by mouth at bedtime. Patient taking differently: Take 0.8 mg by mouth at bedtime.  05/02/13  Yes Angiulli, Lavon Paganini, PA-C  Tetrahydroz-Dextran-PEG-Povid (EYE DROPS ADVANCED RELIEF OP) Place 1 drop into  both eyes daily as needed (dry eyes).    Yes [provider]     Vitals:   05/16/20 2226 05/17/20 8757 05/17/20 0419 05/17/20 0624  BP: (!) 141/65 (!) 135/58  140/76  Pulse: 77 63  73  Resp: _0 Temp: 98.5 F (36.9 C) 98.1 F (36.7 C)  98 F (36.7 C)  TempSrc:  Oral  Oral  SpO2: 96% 99%  100%  Weight:   122.2 kg   Height:       Exam Gen alert, large-framed AAM, not in distress, nasal O2 4L No rash, cyanosis or gangrene Sclera anicteric, throat clear  +JVD Chest dec'd BS bilat bases, some rales as well RRR no MRG Abd soft ntnd no mass or ascites +bs GU normal male MS no joint effusions or deformity Ext 2+ diffuse bilat pitting LE edema, no wounds or ulcers Neuro is alert, Ox 3 , nf RUA AVF+ wound intact, 2+ RUE edema, +bruit    Home meds:  - norvasc 10/ asa 81/ coreg 12.5 bid/ lasix 40 qd/ hydralazine 25 bid/ losartan 50 bid/ crestor 10   - flomax 0.4/ avodart 0.5   - colchicine 0.6 qd prn/ allopurinol 100 qd  - insulin levemir 30-45 sq qd/ novolog 4-10 tid ac  - remeron 7.5 hs/ oxy IR prn  - prn's/ vitamins/ supplements    CXR 7/3 - IMPRESSION: Mild patchy opacity of right lung base, developing pneumonia is not excluded. Central pulmonary vascular congestion without pulmonary edema.   CT chest 7/3 -  IMPRESSION: 1. Small bilateral pleural effusions with compressive basilar atelectasis. Mild smooth septal thickening suggesting pulmonary edema. 2. Shotty mediastinal lymph nodes are likely reactive. 3. Coronary artery calcifications and aortic atherosclerosis.     Na 135  K 5.3  CO2 25  BUN 51  Cr 5.01 eGFR 11 ml/min  AG 10  Alb 3.2  WBC  4k Hb 9.1  plt 213   Assessment/ Plan: 1. CKD V - not on dialysis yet. Pt has early uremic symptoms w/ anorexia primarily, no nausea/ emesis. Pt here w/ vol overload and SOB. Imaging show vasc congestion and CT showing septal edema.  All this is likely volume related dyspnea.  Recommend w/ IV lasix 120 bid for now. Have d/w pt's primary nephrologist Dr Posey Pronto, does not need HD at this time.  When diuresed, can dc home and he can keep his plan to move back to [place]. I have d/w the daughter, she will try to push back their PCP appt to next week. Will follow.  2. HD access - R BC AVF created in April 2021 and revised/ superficialized 1 wk ago. They have f/t appt w/ [facility] in 3 wks and access should be ready to use at that point.  3. HTN - cont home meds except hold losartan during diuresis 4. IDDM - per Pamelia Hoit  MD 05/17/2020, 8:11 AM  Recent Labs  Lab 05/16/20 1548 05/17/20 0412  WBC 7.8 4.4  HGB 9.0* 9.1*   Recent Labs  Lab 05/16/20 1548 05/17/20 0412  K 4.7 5.3*  BUN 50* 51*  CREATININE 4.50* 5.01*  CALCIUM 8.3* 8.3*

## 2020-05-17 NOTE — Progress Notes (Signed)
Nighttime blood glucose 320. Levemir ordered but no SSI. Covering Provider made aware.[name] awaiting orders.

## 2020-05-17 NOTE — Progress Notes (Signed)
  Echocardiogram 2D Echocardiogram has been performed.  Ryan Jimenez 05/17/2020, 10:53 AM

## 2020-05-17 NOTE — Progress Notes (Signed)
[facility]  Asked to see patient, re dyspnea Seen briefly with Dr. Jonnie Finner Physical exam, history and imaging suggestive of volume overload. Agree with Renal/TRH plans for diuresis If dyspnea unresolved with adequate volume removal please let us know and we will be happy to see for formal consultation  Roselie Awkward, MD [facility] Pager: [phone] Cell: [phone] If no response, call [phone]

## 2020-05-17 NOTE — Progress Notes (Signed)
PROGRESS NOTE    Ryan Jimenez  OEU:[id number] DOB: 1931/11/29 DOA: 05/16/2020 PCP: Nolene Ebbs, MD   Chef Complaints: Shortness of breath  Brief Narrative: 86 y.o. 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  Shortness of breath has been going on for 3 months had seen by PCP in urgent care and has gone through multiple rounds of antibiotics and steroids and nebulizer treatment. He had called EMS in the last 2 to 3 weeks on 3 separate occasions. Prednisone did not help him much and sugar got in 500s. He does have leg swelling b/l LE and worse for baseline. Has cough with clear/ white phlegm. No report of chest pain, fever, vomiting, abdominal pain, focal weakness.  ED Course: Vitals stable was placed on oxygen and on room air desaturated at 88% per ED physician, blood work showed elevated BUN/creatinine at 4.5, EKG showed sinus rhythm RV no acute ischemic changes.  Chest x-ray showed mild patchy opacity of the right lung base developing pneumonia is not excluded, central pulmonary vascular congestion without pulmonary edema noted.  Patient was given Solu-Medrol 125 mg, bronchodilators nebulizer.  D-dimer was elevated 1.7, ED doctor felt low suspicion of PE, and had asymmetric swelling of the leg and duplex of the leg has been ordered. Given his acute on chronic dyspnea, new oxygen requirement admission was requested for further management and pulmonary evaluation  Subjective: Resting comfortably. Getting echo done. On nasal cannula saturating well Vitals otherwise stable Having shortness of breath and leg edema.  Assessment & Plan:  Acute hypoxic respiratory failure/dyspnea x3  months with acute worsening/history of recent treatment with multiple antibiotics also prednisone: 40-50-pack-year history of smoking/ANCA positive vasculitis no formal PFT done yet, discussed with pulmonary for evaluation.  Exam single leg edema in the setting of CKD and also imaging with pulmonary edema- CT chest-small bilateral pleural effusions with compressive basilar atelectasis, septal thickening suggestive of pulmonary edema, some emphysema, will likely benefit with further diuresis - will discuss with nephrology given his CKD.  Mild hyperkalemia hold his ARB  Hypercholesterolemia:  Continue home statin. 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 :  Renal function more or less stable.  It appears he had recent fistula revision surgery done past week.prevention showing fluid overload consulted nephrology.  Type 2 diabetes mellitus on long-term insulin with uncontrolled hyperglycemia.  A1c pending.  Patient received a steroid in the ED which may have triggered hyperglycemia continue Lantus 25 units, increase insulin to mod sensitive scale.Hold off steroid as he is very sensitive to prednisone per daughter.  BPH: Continue Flomax/Avodart.  Physical deconditioning: PT OT eval  DVT prophylaxis: heparin injection 5,000 Units Start: 05/16/20 2200 Code Status: Full Code Family Communication: plan of care discussed with patient at bedside.  Status is: Inpatient  Remains inpatient appropriate because:IV treatments appropriate due to intensity of illness or inability to take PO, Inpatient level of care appropriate due to severity of illness and For ongoing shortness of breath, further speciality consultation   Dispo: The patient is from: Home              Anticipated d/c is to: Home  Anticipated d/c date is: 2 days              Patient currently is not  medically stable to d/c.        Nutrition: Diet Order            Diet renal/carb modified with fluid restriction Diet-HS Snack? Nothing; Fluid restriction: 1200 mL Fluid; Room service appropriate? Yes; Fluid consistency: Thin  Diet effective now                       Body mass index is 39.78 kg/m. Pressure Ulcer:    Consultants:see note  Procedures:see note Microbiology:see note  Medications: Scheduled Meds: . allopurinol  100 mg Oral Daily  . amLODipine  10 mg Oral Daily  . aspirin EC  81 mg Oral Daily  . carvedilol  12.5 mg Oral BID  . dutasteride  0.5 mg Oral Daily  . fluticasone  2 spray Each Nare QHS  . guaiFENesin  600 mg Oral BID  . heparin  5,000 Units Subcutaneous Q8H  . hydrALAZINE  25 mg Oral BID  . insulin aspart  0-15 Units Subcutaneous TID WC  . insulin detemir  25 Units Subcutaneous QHS  . ipratropium-albuterol  3 mL Nebulization TID  . loratadine  10 mg Oral Daily  . mirtazapine  7.5 mg Oral QPM  . multivitamin with minerals  1 tablet Oral Daily  . rosuvastatin  10 mg Oral QHS  . tamsulosin  0.8 mg Oral QHS   Continuous Infusions: . furosemide      Antimicrobials: Anti-infectives (From admission, onward)   None       Objective: Vitals: Today's Vitals   05/17/20 0624 05/17/20 0739 05/17/20 0839 05/17/20 1044  BP: 140/76   136/61  Pulse: 73   62  Resp: 18   20  Temp: 98 F (36.7 C)   98.2 F (36.8 C)  TempSrc: Oral   Oral  SpO2: 100%  100% 97%  Weight:      Height:      PainSc:  0-No pain      Intake/Output Summary (Last 24 hours) at 05/17/2020 1257 Last data filed at 05/17/2020 1045 Gross per 24 hour  Intake 480 ml  Output 200 ml  Net 280 ml   Filed Weights   05/16/20 1900 05/17/20 0419  Weight: 116.1 kg 122.2 kg   Weight change:    Intake/Output from previous day: No intake/output data recorded. Intake/Output this shift: Total I/O In: 480 [P.O.:480] Out: 200 [Urine:200]  Examination:  General exam: AAO X3,  elderly,NAD, weak appearing. HEENT:Oral mucosa moist, Ear/Nose WNL grossly,dentition normal. Respiratory system: bilaterally diminished, mild crackles,no wheezing or crackles,no use of accessory muscle, non tender. Cardiovascular system: S1 & S2 +, regular, No JVD. Gastrointestinal system: Abdomen soft, NT,ND, BS+. Nervous System:Alert, awake, moving extremities and grossly nonfocal Extremities: Bilateral pitting ankle edema, distal peripheral pulses palpable.  Skin: No rashes,no icterus. MSK: Normal muscle bulk,tone, power  Data Reviewed: I have personally reviewed following labs and imaging studies CBC: Recent Labs  Lab 05/11/20 0631 05/16/20 1548 05/17/20 0412  WBC  --  7.8 4.4  NEUTROABS  --  5.4  --   HGB 10.5* 9.0* 9.1*  HCT 31.0* 29.0* 29.3*  MCV  --  86.8 87.7  PLT  --  184 160   Basic Metabolic Panel: Recent Labs  Lab 05/11/20 0631 05/16/20 1548 05/17/20 0412  NA 141 137 135  K 4.2 4.7 5.3*  CL  101 99 100  CO2  --  28 25  GLUCOSE 199* 170* 339*  BUN 46* 50* 51*  CREATININE 5.20* 4.50* 5.01*  CALCIUM  --  8.3* 8.3*   GFR: Estimated Creatinine Clearance: 13.9 mL/min (A) (by C-G formula based on SCr of 5.01 mg/dL (H)). Liver Function Tests: Recent Labs  Lab 05/17/20 0412  AST 14*  ALT 9  ALKPHOS 49  BILITOT 0.7  PROT 6.8  ALBUMIN 3.2*   No results for input(s): LIPASE, AMYLASE in the last 168 hours. No results for input(s): AMMONIA in the last 168 hours. Coagulation Profile: No results for input(s): INR, PROTIME in the last 168 hours. Cardiac Enzymes: No results for input(s): CKTOTAL, CKMB, CKMBINDEX, TROPONINI in the last 168 hours. BNP (last 3 results) No results for input(s): PROBNP in the last 8760 hours. HbA1C: No results for input(s): HGBA1C in the last 72 hours. CBG: Recent Labs  Lab 05/11/20 0626 05/11/20 0914 05/16/20 2228 05/17/20 0735 05/17/20 1140  GLUCAP 202* 214* 274* 281* 281*   Lipid Profile: No results for input(s): CHOL,  HDL, LDLCALC, TRIG, CHOLHDL, LDLDIRECT in the last 72 hours. Thyroid Function Tests: No results for input(s): TSH, T4TOTAL, FREET4, T3FREE, THYROIDAB in the last 72 hours. Anemia Panel: No results for input(s): VITAMINB12, FOLATE, FERRITIN, TIBC, IRON, RETICCTPCT in the last 72 hours. Sepsis Labs: No results for input(s): PROCALCITON, LATICACIDVEN in the last 168 hours.  Recent Results (from the past 240 hour(s))  SARS CORONAVIRUS 2 (TAT 6-24 HRS) Nasopharyngeal Nasopharyngeal Swab     Status: None   Collection Time: 05/08/20  1:36 PM   Specimen: Nasopharyngeal Swab  Result Value Ref Range Status   SARS Coronavirus 2 NEGATIVE NEGATIVE Final    Comment: (NOTE) SARS-CoV-2 target nucleic acids are NOT DETECTED.  The SARS-CoV-2 RNA is generally detectable in upper and lower respiratory specimens during the acute phase of infection. Negative results do not preclude SARS-CoV-2 infection, do not rule out co-infections with other pathogens, and should not be used as the sole basis for treatment or other patient management decisions. Negative results must be combined with clinical observations, patient history, and epidemiological information. The expected result is Negative.  Fact Sheet for Patients: SugarRoll.be  Fact Sheet for Healthcare Providers: https://www.woods-mathews.com/  This test is not yet approved or cleared by the [place] FDA and  has been authorized for detection and/or diagnosis of SARS-CoV-2 by FDA under an Emergency Use Authorization (EUA). This EUA will remain  in effect (meaning this test can be used) for the duration of the COVID-19 declaration under Se ction 564(b)(1) of the Act, 21 U.S.C. section 360bbb-3(b)(1), unless the authorization is terminated or revoked sooner.  Performed at [facility], [place] [address], [place], [place] [address]   SARS Coronavirus 2 by RT PCR (hospital order, performed in [facility] hospital lab) Nasopharyngeal Nasopharyngeal Swab     Status: None   Collection Time: 05/16/20  3:48 PM   Specimen: Nasopharyngeal Swab  Result Value Ref Range Status   SARS Coronavirus 2 NEGATIVE NEGATIVE Final    Comment: (NOTE) SARS-CoV-2 target nucleic acids are NOT DETECTED.  The SARS-CoV-2 RNA is generally detectable in upper and lower respiratory specimens during the acute phase of infection. The lowest concentration of SARS-CoV-2 viral copies this assay can detect is 250 copies / mL. A negative result does not preclude SARS-CoV-2 infection and should not be used as the sole basis for treatment or other patient management decisions.  A negative  result may occur with improper specimen collection / handling, submission of specimen other than nasopharyngeal swab, presence of viral mutation(s) within the areas targeted by this assay, and inadequate number of viral copies (<250 copies / mL). A negative result must be combined with clinical observations, patient history, and epidemiological information.  Fact Sheet for Patients:   StrictlyIdeas.no  Fact Sheet for Healthcare Providers: BankingDealers.co.za  This test is not yet approved or  cleared by the [place] FDA and has been authorized for detection and/or diagnosis of SARS-CoV-2 by FDA under an Emergency Use Authorization (EUA).  This EUA will remain in effect (meaning this test can be used) for the duration of the COVID-19 declaration under Section 564(b)(1) of the Act, 21 U.S.C. section 360bbb-3(b)(1), unless the authorization is terminated or revoked sooner.  Performed at [facility], [place] [address], [place], [place] [address]       Radiology Studies: DG Chest 2 View  Result Date: 05/16/2020 CLINICAL DATA:  Shortness of breath EXAM: CHEST - 2 VIEW COMPARISON:  April 20, 2020 FINDINGS: The mediastinal contour and cardiac silhouette are  stable. The heart size is mildly enlarged. Mild patchy opacity of right lung base is identified. Cephalization of flow is identified bilaterally. No frank pulmonary edema or pleural effusion is identified. The bony structures are stable. IMPRESSION: Mild patchy opacity of right lung base, developing pneumonia is not excluded. Central pulmonary vascular congestion without pulmonary edema. Electronically Signed   By: Abelardo Diesel M.D.   On: 05/16/2020 14:48   CT CHEST WO CONTRAST  Result Date: 05/16/2020 CLINICAL DATA:  Cardiac arrest. EXAM: CT CHEST WITHOUT CONTRAST TECHNIQUE: Multidetector CT imaging of the chest was performed following the standard protocol without IV contrast. COMPARISON:  Radiograph earlier today. FINDINGS: Cardiovascular: Aortic atherosclerosis. Branch vessel tortuosity. Upper normal heart size with coronary artery calcifications. There is no pericardial effusion. Mediastinum/Nodes: Shotty mediastinal lymph nodes measuring up to 11 mm in the right lower paratracheal station. Limited assessment for hilar adenopathy on noncontrast exam. No axillary adenopathy. Patulous esophagus with intraluminal density distally, consistent with ingested material. No visualized thyroid nodule. Lungs/Pleura: Mild emphysema. Small bilateral pleural effusions with compressive basilar atelectasis. Mild smooth septal thickening suggesting pulmonary edema. No evidence of pulmonary mass. No pneumothorax. Upper Abdomen: No acute finding. Moderate stool burden in the included colon. Diverticulosis at the splenic flexure. Punctate hepatic granuloma. Musculoskeletal: No sternal or anterior rib fracture. No acute osseous abnormalities. There is mild degenerative change in the spine. IMPRESSION: 1. Small bilateral pleural effusions with compressive basilar atelectasis. Mild smooth septal thickening suggesting pulmonary edema. 2. Shotty mediastinal lymph nodes are likely reactive. 3. Coronary artery calcifications and  aortic atherosclerosis. Aortic Atherosclerosis (ICD10-I70.0) and Emphysema (ICD10-J43.9). Electronically Signed   By: Keith Rake M.D.   On: 05/16/2020 21:12   ECHOCARDIOGRAM COMPLETE  Result Date: 05/17/2020    ECHOCARDIOGRAM REPORT   Patient Name:   PETERSON MATHEY Date of Exam: 05/17/2020 Medical Rec #:  [id number]        Height:       69.0 in Accession #:    [id number]       Weight:       269.4 lb Date of Birth:  19-Feb-1932        BSA:          2.345 m Patient Age:    40 years         BP:           140/76  mmHg Patient Gender: M                HR:           63 bpm. Exam Location:  Inpatient Procedure: 2D Echo, Cardiac Doppler, Color Doppler and Intracardiac            Opacification Agent Indications:    R06.02 SOB  History:        Patient has prior history of Echocardiogram examinations, most                 recent 04/23/2013. Stroke, Signs/Symptoms:Chest Pain; Risk                 Factors:Diabetes, Hypertension and Former Smoker. Chemotherapy.                 Respiratory and renal failure.  Sonographer:    Roseanna Rainbow RDCS Referring Phys: [id number] [facility]  Sonographer Comments: Technically difficult study due to poor echo windows and patient is morbidly obese. Image acquisition challenging due to patient body habitus and Image acquisition challenging due to respiratory motion. IMPRESSIONS  1. Definity used; normal LV systolic function; grade 1 diastolic dysfunction.  2. Left ventricular ejection fraction, by estimation, is 60 to 65%. The left ventricle has normal function. The left ventricle has no regional wall motion abnormalities. Left ventricular diastolic parameters are consistent with Grade I diastolic dysfunction (impaired relaxation).  3. Right ventricular systolic function is normal. The right ventricular size is normal.  4. The mitral valve is normal in structure. Trivial mitral valve regurgitation. No evidence of mitral stenosis.  5. The aortic valve is tricuspid. Aortic valve regurgitation is not  visualized. Mild aortic valve sclerosis is present, with no evidence of aortic valve stenosis.  6. The inferior vena cava is dilated in size with <50% respiratory variability, suggesting right atrial pressure of 15 mmHg. FINDINGS  Left Ventricle: Left ventricular ejection fraction, by estimation, is 60 to 65%. The left ventricle has normal function. The left ventricle has no regional wall motion abnormalities. Definity contrast agent was given IV to delineate the left ventricular  endocardial borders. The left ventricular internal cavity size was normal in size. There is no left ventricular hypertrophy. Left ventricular diastolic parameters are consistent with Grade I diastolic dysfunction (impaired relaxation). Right Ventricle: The right ventricular size is normal. Right ventricular systolic function is normal. Left Atrium: Left atrial size was normal in size. Right Atrium: Right atrial size was normal in size. Pericardium: There is no evidence of pericardial effusion. Mitral Valve: The mitral valve is normal in structure. Normal mobility of the mitral valve leaflets. Trivial mitral valve regurgitation. No evidence of mitral valve stenosis. Tricuspid Valve: The tricuspid valve is normal in structure. Tricuspid valve regurgitation is trivial. No evidence of tricuspid stenosis. Aortic Valve: The aortic valve is tricuspid. Aortic valve regurgitation is not visualized. Mild aortic valve sclerosis is present, with no evidence of aortic valve stenosis. Pulmonic Valve: The pulmonic valve was not well visualized. Pulmonic valve regurgitation is not visualized. No evidence of pulmonic stenosis. Aorta: The aortic root is normal in size and structure. Venous: The inferior vena cava is dilated in size with less than 50% respiratory variability, suggesting right atrial pressure of 15 mmHg. IAS/Shunts: No atrial level shunt detected by color flow Doppler. Additional Comments: Definity used; normal LV systolic function; grade 1  diastolic dysfunction.  LEFT VENTRICLE PLAX 2D LVIDd:         4.75 cm  Diastology LVIDs:         3.36 cm      LV e' lateral:   8.59 cm/s LV PW:         1.30 cm      LV E/e' lateral: 10.8 LV IVS:        1.01 cm      LV e' medial:    6.74 cm/s LVOT diam:     2.10 cm      LV E/e' medial:  13.8 LV SV:         94 LV SV Index:   40 LVOT Area:     3.46 cm  LV Volumes (MOD) LV vol d, MOD A2C: 108.0 ml LV vol d, MOD A4C: 104.7 ml LV vol s, MOD A2C: 34.3 ml LV vol s, MOD A4C: 38.4 ml LV SV MOD A2C:     73.7 ml LV SV MOD A4C:     104.7 ml LV SV MOD BP:      71.2 ml RIGHT VENTRICLE             IVC RV S prime:     10.60 cm/s  IVC diam: 2.18 cm TAPSE (M-mode): 2.0 cm LEFT ATRIUM             Index       RIGHT ATRIUM           Index LA diam:        4.20 cm 1.79 cm/m  RA Area:     19.80 cm LA Vol (A2C):   54.7 ml 23.33 ml/m RA Volume:   59.10 ml  25.20 ml/m LA Vol (A4C):   49.4 ml 21.07 ml/m LA Biplane Vol: 53.3 ml 22.73 ml/m  AORTIC VALVE LVOT Vmax:   119.00 cm/s LVOT Vmean:  89.000 cm/s LVOT VTI:    0.271 m  AORTA Ao Root diam: 3.70 cm MITRAL VALVE MV Area (PHT): 2.91 cm    SHUNTS MV Decel Time: 261 msec    Systemic VTI:  0.27 m MV E velocity: 93.10 cm/s  Systemic Diam: 2.10 cm MV A velocity: 79.10 cm/s MV E/A ratio:  1.18 Kirk Ruths MD Electronically signed by Kirk Ruths MD Signature Date/Time: 05/17/2020/11:28:04 AM    Final    VAS [place] LOWER EXTREMITY VENOUS (DVT)  Result Date: 05/16/2020  Lower Venous DVTStudy Indications: Edema.  Limitations: Edema. Comparison Study: No prior study on file Performing Technologist: Sharion Dove RVS  Examination Guidelines: A complete evaluation includes B-mode imaging, spectral Doppler, color Doppler, and power Doppler as needed of all accessible portions of each vessel. Bilateral testing is considered an integral part of a complete examination. Limited examinations for reoccurring indications may be performed as noted. The reflux portion of the exam is performed with the  patient in reverse Trendelenburg.  +---------+---------------+---------+-----------+----------+--------------+ RIGHT    CompressibilityPhasicitySpontaneityPropertiesThrombus Aging +---------+---------------+---------+-----------+----------+--------------+ CFV      Full           Yes      Yes                                 +---------+---------------+---------+-----------+----------+--------------+ SFJ      Full                                                        +---------+---------------+---------+-----------+----------+--------------+  FV Prox  Full                                                        +---------+---------------+---------+-----------+----------+--------------+ FV Mid   Full                                                        +---------+---------------+---------+-----------+----------+--------------+ FV DistalFull                                                        +---------+---------------+---------+-----------+----------+--------------+ PFV      Full                                                        +---------+---------------+---------+-----------+----------+--------------+ POP      Full           Yes      Yes                                 +---------+---------------+---------+-----------+----------+--------------+ PTV      Full                                                        +---------+---------------+---------+-----------+----------+--------------+ PERO                                                  Not visualized +---------+---------------+---------+-----------+----------+--------------+   +----+---------------+---------+-----------+----------+--------------+ LEFTCompressibilityPhasicitySpontaneityPropertiesThrombus Aging +----+---------------+---------+-----------+----------+--------------+ CFV Full           Yes      Yes                                  +----+---------------+---------+-----------+----------+--------------+     Summary: RIGHT: - There is no evidence of deep vein thrombosis in the lower extremity. However, portions of this examination were limited- see technologist comments above.  Interstitial fluid noted throughout calf  LEFT: - No evidence of common femoral vein obstruction. - Ultrasound characteristics of enlarged lymph nodes noted in the groin.  *See table(s) above for measurements and observations.    Preliminary      LOS: 1 day   Antonieta Pert, MD [facility]  05/17/2020, 12:57 PM

## 2020-05-18 DIAGNOSIS — I10 Essential (primary) hypertension: Secondary | ICD-10-CM

## 2020-05-18 LAB — COMPREHENSIVE METABOLIC PANEL
ALT: 11 U/L (ref 0–44)
AST: 24 U/L (ref 15–41)
Albumin: 3.4 g/dL — ABNORMAL LOW (ref 3.5–5.0)
Alkaline Phosphatase: 45 U/L (ref 38–126)
Anion gap: 13 (ref 5–15)
BUN: UNDETERMINED mg/dL (ref 8–23)
CO2: 21 mmol/L — ABNORMAL LOW (ref 22–32)
Calcium: 8.8 mg/dL — ABNORMAL LOW (ref 8.9–10.3)
Chloride: 101 mmol/L (ref 98–111)
Creatinine, Ser: 5.31 mg/dL — ABNORMAL HIGH (ref 0.61–1.24)
GFR calc Af Amer: 11 mL/min — ABNORMAL LOW (ref >60)
GFR calc non Af Amer: 9 mL/min — ABNORMAL LOW (ref >60)
Glucose, Bld: 243 mg/dL — ABNORMAL HIGH (ref 70–99)
Potassium: 6.1 mmol/L — ABNORMAL HIGH (ref 3.5–5.1)
Sodium: 135 mmol/L (ref 135–145)
Total Bilirubin: 0.5 mg/dL (ref 0.3–1.2)
Total Protein: 6.8 g/dL (ref 6.5–8.1)

## 2020-05-18 LAB — RENAL FUNCTION PANEL
Albumin: 3.5 g/dL (ref 3.5–5.0)
Anion gap: 11 (ref 5–15)
BUN: 69 mg/dL — ABNORMAL HIGH (ref 8–23)
CO2: 29 mmol/L (ref 22–32)
Calcium: 8.6 mg/dL — ABNORMAL LOW (ref 8.9–10.3)
Chloride: 99 mmol/L (ref 98–111)
Creatinine, Ser: 5.29 mg/dL — ABNORMAL HIGH (ref 0.61–1.24)
GFR calc Af Amer: 11 mL/min — ABNORMAL LOW (ref >60)
GFR calc non Af Amer: 9 mL/min — ABNORMAL LOW (ref >60)
Glucose, Bld: 208 mg/dL — ABNORMAL HIGH (ref 70–99)
Phosphorus: 4.5 mg/dL (ref 2.5–4.6)
Potassium: 4.1 mmol/L (ref 3.5–5.1)
Sodium: 139 mmol/L (ref 135–145)

## 2020-05-18 LAB — CBC
HCT: 28.4 % — ABNORMAL LOW (ref 39.0–52.0)
Hemoglobin: 8.9 g/dL — ABNORMAL LOW (ref 13.0–17.0)
MCH: 26.9 pg (ref 26.0–34.0)
MCHC: 31.3 g/dL (ref 30.0–36.0)
MCV: 85.8 fL (ref 80.0–100.0)
Platelets: 209 10*3/uL (ref 150–400)
RBC: 3.31 MIL/uL — ABNORMAL LOW (ref 4.22–5.81)
RDW: 14.7 % (ref 11.5–15.5)
WBC: 8.5 10*3/uL (ref 4.0–10.5)
nRBC: 0 % (ref 0.0–0.2)

## 2020-05-18 LAB — GLUCOSE, CAPILLARY
Glucose-Capillary: 196 mg/dL — ABNORMAL HIGH (ref 70–99)
Glucose-Capillary: 244 mg/dL — ABNORMAL HIGH (ref 70–99)
Glucose-Capillary: 121 mg/dL — ABNORMAL HIGH (ref 70–99)
Glucose-Capillary: 146 mg/dL — ABNORMAL HIGH (ref 70–99)

## 2020-05-18 MED ORDER — IPRATROPIUM-ALBUTEROL 0.5-2.5 (3) MG/3ML IN SOLN
3.0000 mL | Freq: Two times a day (BID) | RESPIRATORY_TRACT | Status: DC
  Administered 2020-05-19: 3 mL via RESPIRATORY_TRACT
  Filled 2020-05-18: qty 3

## 2020-05-18 MED ORDER — INSULIN ASPART 100 UNIT/ML ~~LOC~~ SOLN
0.0000 [IU] | Freq: Every day | SUBCUTANEOUS | Status: DC
  Administered 2020-05-18 – 2020-05-19 (×2): 2 [IU] via SUBCUTANEOUS

## 2020-05-18 MED ORDER — SODIUM ZIRCONIUM CYCLOSILICATE 10 G PO PACK
10.0000 g | PACK | Freq: Every day | ORAL | Status: AC
  Administered 2020-05-18 – 2020-05-19 (×2): 10 g via ORAL
  Filled 2020-05-18 (×2): qty 1

## 2020-05-18 MED ORDER — INSULIN ASPART 100 UNIT/ML ~~LOC~~ SOLN
0.0000 [IU] | Freq: Three times a day (TID) | SUBCUTANEOUS | Status: DC
Start: 2020-05-18 — End: 2020-05-18

## 2020-05-18 MED ORDER — INSULIN ASPART 100 UNIT/ML ~~LOC~~ SOLN
4.0000 [IU] | Freq: Three times a day (TID) | SUBCUTANEOUS | Status: DC
  Administered 2020-05-18 (×2): 4 [IU] via SUBCUTANEOUS

## 2020-05-18 MED ORDER — SODIUM BICARBONATE 8.4 % IV SOLN
100.0000 meq | Freq: Once | INTRAVENOUS | Status: AC
  Administered 2020-05-18: 100 meq via INTRAVENOUS
  Filled 2020-05-18: qty 50

## 2020-05-18 MED ORDER — INSULIN DETEMIR 100 UNIT/ML ~~LOC~~ SOLN
35.0000 [IU] | Freq: Every day | SUBCUTANEOUS | Status: DC
  Administered 2020-05-18: 35 [IU] via SUBCUTANEOUS
  Filled 2020-05-18: qty 0.35

## 2020-05-18 MED ORDER — SENNOSIDES-DOCUSATE SODIUM 8.6-50 MG PO TABS
1.0000 | ORAL_TABLET | Freq: Every day | ORAL | Status: DC | PRN
  Administered 2020-05-18 – 2020-05-19 (×2): 1 via ORAL
  Filled 2020-05-18 (×2): qty 1

## 2020-05-18 MED ORDER — INSULIN GLARGINE 100 UNIT/ML ~~LOC~~ SOLN
10.0000 [IU] | Freq: Once | SUBCUTANEOUS | Status: AC
  Administered 2020-05-18: 10 [IU] via SUBCUTANEOUS
  Filled 2020-05-18: qty 0.1

## 2020-05-18 NOTE — Progress Notes (Signed)
PROGRESS NOTE    Ryan Jimenez  RKY:[id number] DOB: Nov 02, 1932 DOA: 05/16/2020 PCP: Nolene Ebbs, MD   Chef Complaints: Shortness of breath  Brief Narrative: 86 y.o. 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  Shortness of breath has been going on for 3 months had seen by PCP in urgent care and has gone through multiple rounds of antibiotics and steroids and nebulizer treatment. He had called EMS in the last 2 to 3 weeks on 3 separate occasions. Prednisone did not help him much and sugar got in 500s. He does have leg swelling b/l LE and worse for baseline. Has cough with clear/ white phlegm. No report of chest pain, fever, vomiting, abdominal pain, focal weakness.  ED Course: Vitals stable was placed on oxygen and on room air desaturated at 88% per ED physician, blood work showed elevated BUN/creatinine at 4.5, EKG showed sinus rhythm RV no acute ischemic changes.  Chest x-ray showed mild patchy opacity of the right lung base developing pneumonia is not excluded, central pulmonary vascular congestion without pulmonary edema noted.  Patient was given Solu-Medrol 125 mg, bronchodilators nebulizer.  D-dimer was elevated 1.7, ED doctor felt low suspicion of PE, and had asymmetric swelling of the leg and duplex of the leg has been ordered. Given his acute on chronic dyspnea, new oxygen requirement admission was requested for further management and pulmonary evaluation Underwent CT chest showing pulmonary edema/venous congestion. Seen by nephrology Patient is being diuresed for fluid overload contributing to his symptoms  Subjective:  Overnight potassium has bumped up and sugar poorly  controlled. UOP Peaked up at 1350 mL. He feels overall less short of breath, still edematous.  Assessment & Plan:  Acute hypoxic respiratory failure/dyspnea x3 months with acute worsening/history of recent treatment with multiple antibiotics also prednisone: 40-50-pack-year history of smoking/ANCA positive vasculitis no formal PFT done yet.  CT shows bilateral small pleural effusion with compressive basilar atelectasis septal thickening suggestive of pulmonary edema, and also bilateral ankle edema on exam and in the setting of CKD suspecting symptoms contributed by fluid overload.  Patient negative for DVT.  Plan is to continue on diuresis per nephrology.  Monitor intake output Daily weight.  Discussee w/ pulmonary and nephrology. Once volume status improves patient to be discharged. He plans to return to [place] this week and follow-up with PCP  Hyperkalemia with CKD and hyperglycemia.  Discussed with nephrology and have ordered sodium bicarb and Lokelma.  Also increase insulin regimen to optimize blood sugar.  ARB is on hold.  Hypercholesterolemia:  Continue statin Hypertension:  BP is well controlled multiple home meds amlodipine, Coreg, hydralazine but holding ARB  Mild AKI on CKD stage V not on HD: Slight bump in creatinine overall at baseline baseline creatinine 4-5 BUN in 50s: Appreciate nephrology input plan is to continue IV diuresis for improvement of patient's dyspnea and leg swelling BUN/creatinine slightly uptrending and will be monitored very closely.  Recent superficialization of AV fistula by Dr. Carlis Abbott  Type 2 diabetes mellitus on long-term insulin with uncontrolled hyperglycemia.  A1c at 8.  Hyperglycemia contributed by IV steroids  Received in the ED. Will  Increase bedtime Lantus to 35 units, will give 10 units Lantus x1 now add premeal Humalog, continue sliding scale insulin with nighttime coverage  added  BPH: Continue Flomax/Avodart.  Physical deconditioning: PT OT  eval  Morbid obesity with BMI 39.  Follow-up with PCP for weight loss.  Anemia of chronic disease hemoglobin at 8 to 9 g. Monitor.  DVT prophylaxis: heparin injection 5,000 Units Start: 05/16/20 2200 Code Status: Full Code Family Communication: plan of care discussed with patient at bedside.  Status is: Inpatient  Remains inpatient appropriate because:IV treatments appropriate due to intensity of illness or inability to take PO, Inpatient level of care appropriate due to severity of illness and For ongoing shortness of breath, further speciality consultation   Dispo: The patient is from: Home              Anticipated d/c is to: Home              Anticipated d/c date is: 2 days              Patient currently is not medically stable to d/c.  Nutrition: Diet Order            Diet renal/carb modified with fluid restriction Diet-HS Snack? Nothing; Fluid restriction: 1200 mL Fluid; Room service appropriate? Yes; Fluid consistency: Thin  Diet effective now                       Body mass index is 39.43 kg/m. Consultants: Nephrology, PCCM  Procedures:see note Microbiology:see note  Medications: Scheduled Meds: . allopurinol  100 mg Oral Daily  . amLODipine  10 mg Oral Daily  . aspirin EC  81 mg Oral Daily  . carvedilol  12.5 mg Oral BID  . dutasteride  0.5 mg Oral Daily  . fluticasone  2 spray Each Nare QHS  . guaiFENesin  600 mg Oral BID  . heparin  5,000 Units Subcutaneous Q8H  . hydrALAZINE  25 mg Oral BID  . insulin aspart  0-15 Units Subcutaneous TID WC  . insulin aspart  0-5 Units Subcutaneous QHS  . insulin aspart  4 Units Subcutaneous TID WC  . insulin detemir  35 Units Subcutaneous QHS  . ipratropium-albuterol  3 mL Nebulization TID  . loratadine  10 mg Oral Daily  . mouth rinse  15 mL Mouth Rinse BID  . mirtazapine  7.5 mg Oral QPM  . multivitamin with minerals  1 tablet Oral Daily  . rosuvastatin  10 mg Oral QHS  . sodium zirconium cyclosilicate  10 g  Oral Daily  . tamsulosin  0.8 mg Oral QHS   Continuous Infusions: . furosemide 120 mg (05/18/20 1258)    Antimicrobials: Anti-infectives (From admission, onward)   None       Objective: Vitals: Today's Vitals   05/17/20 2057 05/18/20 0510 05/18/20 0513 05/18/20 0911  BP:  (!) 141/67 (!) 147/69   Pulse:  61 76   Resp:  18 18   Temp:  97.9 F (36.6 C) 97.9 F (36.6 C)   TempSrc:  Oral Oral   SpO2: 99% 98% 98% 98%  Weight:   121.1 kg   Height:   5\' 9"  (1.753 m)   PainSc:        Intake/Output Summary (Last 24 hours) at 05/18/2020 1330 Last data filed at 05/18/2020 1214 Gross per 24 hour  Intake 1504 ml  Output 1775 ml  Net -271 ml   Filed Weights   05/16/20 1900 05/17/20 0419 05/18/20 0513  Weight: 116.1 kg 122.2 kg 121.1 kg   Weight change: 4.979 kg   Intake/Output from previous  day: 07/04 0701 - 07/05 0700 In: 4259 [P.O.:1480; IV Piggyback:124] Out: 1350 [Urine:1350] Intake/Output this shift: Total I/O In: 380 [P.O.:380] Out: 625 [Urine:625]  Examination:  General exam: AAO , NAD, weak appearing. HEENT:Oral mucosa moist, Ear/Nose WNL grossly, dentition normal. Respiratory system: bilaterally clear,no wheezing or crackles,no use of accessory muscle Cardiovascular system: S1 & S2 +, No JVD,. Gastrointestinal system: Abdomen soft, NT,ND, BS+ Nervous System:Alert, awake, moving extremities and grossly nonfocal Extremities: Bilateral pitting edema+,distal peripheral pulses palpable.  Skin: No rashes,no icterus. MSK: Normal muscle bulk,tone, power    Data Reviewed: I have personally reviewed following labs and imaging studies CBC: Recent Labs  Lab 05/16/20 1548 05/17/20 0412 05/18/20 0450  WBC 7.8 4.4 8.5  NEUTROABS 5.4  --   --   HGB 9.0* 9.1* 8.9*  HCT 29.0* 29.3* 28.4*  MCV 86.8 87.7 85.8  PLT 184 213 563   Basic Metabolic Panel: Recent Labs  Lab 05/16/20 1548 05/17/20 0412 05/18/20 0450  NA 137 135 135  K 4.7 5.3* 6.1*  CL 99 100 101   CO2 28 25 21*  GLUCOSE 170* 339* 243*  BUN 50* 51* QUANTITY NOT SUFFICIENT, UNABLE TO PERFORM TEST  CREATININE 4.50* 5.01* 5.31*  CALCIUM 8.3* 8.3* 8.8*   GFR: Estimated Creatinine Clearance: 13.1 mL/min (A) (by C-G formula based on SCr of 5.31 mg/dL (H)). Liver Function Tests: Recent Labs  Lab 05/17/20 0412 05/18/20 0450  AST 14* 24  ALT 9 11  ALKPHOS 49 45  BILITOT 0.7 0.5  PROT 6.8 6.8  ALBUMIN 3.2* 3.4*   No results for input(s): LIPASE, AMYLASE in the last 168 hours. No results for input(s): AMMONIA in the last 168 hours. Coagulation Profile: No results for input(s): INR, PROTIME in the last 168 hours. Cardiac Enzymes: No results for input(s): CKTOTAL, CKMB, CKMBINDEX, TROPONINI in the last 168 hours. BNP (last 3 results) No results for input(s): PROBNP in the last 8760 hours. HbA1C: Recent Labs    05/16/20 1744  HGBA1C 8.1*   CBG: Recent Labs  Lab 05/17/20 1623 05/17/20 1625 05/17/20 2144 05/18/20 0740 05/18/20 1128  GLUCAP 260* 270* 320* 196* 244*   Lipid Profile: No results for input(s): CHOL, HDL, LDLCALC, TRIG, CHOLHDL, LDLDIRECT in the last 72 hours. Thyroid Function Tests: No results for input(s): TSH, T4TOTAL, FREET4, T3FREE, THYROIDAB in the last 72 hours. Anemia Panel: No results for input(s): VITAMINB12, FOLATE, FERRITIN, TIBC, IRON, RETICCTPCT in the last 72 hours. Sepsis Labs: No results for input(s): PROCALCITON, LATICACIDVEN in the last 168 hours.  Recent Results (from the past 240 hour(s))  SARS CORONAVIRUS 2 (TAT 6-24 HRS) Nasopharyngeal Nasopharyngeal Swab     Status: None   Collection Time: 05/08/20  1:36 PM   Specimen: Nasopharyngeal Swab  Result Value Ref Range Status   SARS Coronavirus 2 NEGATIVE NEGATIVE Final    Comment: (NOTE) SARS-CoV-2 target nucleic acids are NOT DETECTED.  The SARS-CoV-2 RNA is generally detectable in upper and lower respiratory specimens during the acute phase of infection. Negative results do not  preclude SARS-CoV-2 infection, do not rule out co-infections with other pathogens, and should not be used as the sole basis for treatment or other patient management decisions. Negative results must be combined with clinical observations, patient history, and epidemiological information. The expected result is Negative.  Fact Sheet for Patients: SugarRoll.be  Fact Sheet for Healthcare Providers: https://www.woods-mathews.com/  This test is not yet approved or cleared by the [place] FDA and  has been authorized for detection  and/or diagnosis of SARS-CoV-2 by FDA under an Emergency Use Authorization (EUA). This EUA will remain  in effect (meaning this test can be used) for the duration of the COVID-19 declaration under Se ction 564(b)(1) of the Act, 21 U.S.C. section 360bbb-3(b)(1), unless the authorization is terminated or revoked sooner.  Performed at [facility], [place] [address] [place], [place] [address]   SARS Coronavirus 2 by RT PCR (hospital order, performed in [facility] hospital lab) Nasopharyngeal Nasopharyngeal Swab     Status: None   Collection Time: 05/16/20  3:48 PM   Specimen: Nasopharyngeal Swab  Result Value Ref Range Status   SARS Coronavirus 2 NEGATIVE NEGATIVE Final    Comment: (NOTE) SARS-CoV-2 target nucleic acids are NOT DETECTED.  The SARS-CoV-2 RNA is generally detectable in upper and lower respiratory specimens during the acute phase of infection. The lowest concentration of SARS-CoV-2 viral copies this assay can detect is 250 copies / mL. A negative result does not preclude SARS-CoV-2 infection and should not be used as the sole basis for treatment or other patient management decisions.  A negative result may occur with improper specimen collection / handling, submission of specimen other than nasopharyngeal swab, presence of viral mutation(s) within the areas targeted by this assay, and inadequate  number of viral copies (<250 copies / mL). A negative result must be combined with clinical observations, patient history, and epidemiological information.  Fact Sheet for Patients:   StrictlyIdeas.no  Fact Sheet for Healthcare Providers: BankingDealers.co.za  This test is not yet approved or  cleared by the [place] FDA and has been authorized for detection and/or diagnosis of SARS-CoV-2 by FDA under an Emergency Use Authorization (EUA).  This EUA will remain in effect (meaning this test can be used) for the duration of the COVID-19 declaration under Section 564(b)(1) of the Act, 21 U.S.C. section 360bbb-3(b)(1), unless the authorization is terminated or revoked sooner.  Performed at [facility], [place] [address], [place],  [address]       Radiology Studies: DG Chest 2 View  Result Date: 05/16/2020 CLINICAL DATA:  Shortness of breath EXAM: CHEST - 2 VIEW COMPARISON:  April 20, 2020 FINDINGS: The mediastinal contour and cardiac silhouette are stable. The heart size is mildly enlarged. Mild patchy opacity of right lung base is identified. Cephalization of flow is identified bilaterally. No frank pulmonary edema or pleural effusion is identified. The bony structures are stable. IMPRESSION: Mild patchy opacity of right lung base, developing pneumonia is not excluded. Central pulmonary vascular congestion without pulmonary edema. Electronically Signed   By: Abelardo Diesel M.D.   On: 05/16/2020 14:48   CT CHEST WO CONTRAST  Result Date: 05/16/2020 CLINICAL DATA:  Cardiac arrest. EXAM: CT CHEST WITHOUT CONTRAST TECHNIQUE: Multidetector CT imaging of the chest was performed following the standard protocol without IV contrast. COMPARISON:  Radiograph earlier today. FINDINGS: Cardiovascular: Aortic atherosclerosis. Branch vessel tortuosity. Upper normal heart size with coronary artery calcifications. There is no pericardial  effusion. Mediastinum/Nodes: Shotty mediastinal lymph nodes measuring up to 11 mm in the right lower paratracheal station. Limited assessment for hilar adenopathy on noncontrast exam. No axillary adenopathy. Patulous esophagus with intraluminal density distally, consistent with ingested material. No visualized thyroid nodule. Lungs/Pleura: Mild emphysema. Small bilateral pleural effusions with compressive basilar atelectasis. Mild smooth septal thickening suggesting pulmonary edema. No evidence of pulmonary mass. No pneumothorax. Upper Abdomen: No acute finding. Moderate stool burden in the included colon. Diverticulosis at the splenic flexure. Punctate hepatic  granuloma. Musculoskeletal: No sternal or anterior rib fracture. No acute osseous abnormalities. There is mild degenerative change in the spine. IMPRESSION: 1. Small bilateral pleural effusions with compressive basilar atelectasis. Mild smooth septal thickening suggesting pulmonary edema. 2. Shotty mediastinal lymph nodes are likely reactive. 3. Coronary artery calcifications and aortic atherosclerosis. Aortic Atherosclerosis (ICD10-I70.0) and Emphysema (ICD10-J43.9). Electronically Signed   By: Keith Rake M.D.   On: 05/16/2020 21:12   ECHOCARDIOGRAM COMPLETE  Result Date: 05/17/2020    ECHOCARDIOGRAM REPORT   Patient Name:   TALBOT MONARCH Date of Exam: 05/17/2020 Medical Rec #:  [id number]        Height:       69.0 in Accession #:    [id number]       Weight:       269.4 lb Date of Birth:  [birth year]        BSA:          2.345 m Patient Age:    28 years         BP:           140/76 mmHg Patient Gender: M                HR:           63 bpm. Exam Location:  Inpatient Procedure: 2D Echo, Cardiac Doppler, Color Doppler and Intracardiac            Opacification Agent Indications:    R06.02 SOB  History:        Patient has prior history of Echocardiogram examinations, most                 recent 04/23/2013. Stroke, Signs/Symptoms:Chest Pain; Risk                  Factors:Diabetes, Hypertension and Former Smoker. Chemotherapy.                 Respiratory and renal failure.  Sonographer:    Roseanna Rainbow RDCS Referring Phys: [id number] [facility]  Sonographer Comments: Technically difficult study due to poor echo windows and patient is morbidly obese. Image acquisition challenging due to patient body habitus and Image acquisition challenging due to respiratory motion. IMPRESSIONS  1. Definity used; normal LV systolic function; grade 1 diastolic dysfunction.  2. Left ventricular ejection fraction, by estimation, is 60 to 65%. The left ventricle has normal function. The left ventricle has no regional wall motion abnormalities. Left ventricular diastolic parameters are consistent with Grade I diastolic dysfunction (impaired relaxation).  3. Right ventricular systolic function is normal. The right ventricular size is normal.  4. The mitral valve is normal in structure. Trivial mitral valve regurgitation. No evidence of mitral stenosis.  5. The aortic valve is tricuspid. Aortic valve regurgitation is not visualized. Mild aortic valve sclerosis is present, with no evidence of aortic valve stenosis.  6. The inferior vena cava is dilated in size with <50% respiratory variability, suggesting right atrial pressure of 15 mmHg. FINDINGS  Left Ventricle: Left ventricular ejection fraction, by estimation, is 60 to 65%. The left ventricle has normal function. The left ventricle has no regional wall motion abnormalities. Definity contrast agent was given IV to delineate the left ventricular  endocardial borders. The left ventricular internal cavity size was normal in size. There is no left ventricular hypertrophy. Left ventricular diastolic parameters are consistent with Grade I diastolic dysfunction (impaired relaxation). Right Ventricle: The right ventricular size is normal. Right ventricular systolic function is normal.  Left Atrium: Left atrial size was normal in size. Right Atrium: Right  atrial size was normal in size. Pericardium: There is no evidence of pericardial effusion. Mitral Valve: The mitral valve is normal in structure. Normal mobility of the mitral valve leaflets. Trivial mitral valve regurgitation. No evidence of mitral valve stenosis. Tricuspid Valve: The tricuspid valve is normal in structure. Tricuspid valve regurgitation is trivial. No evidence of tricuspid stenosis. Aortic Valve: The aortic valve is tricuspid. Aortic valve regurgitation is not visualized. Mild aortic valve sclerosis is present, with no evidence of aortic valve stenosis. Pulmonic Valve: The pulmonic valve was not well visualized. Pulmonic valve regurgitation is not visualized. No evidence of pulmonic stenosis. Aorta: The aortic root is normal in size and structure. Venous: The inferior vena cava is dilated in size with less than 50% respiratory variability, suggesting right atrial pressure of 15 mmHg. IAS/Shunts: No atrial level shunt detected by color flow Doppler. Additional Comments: Definity used; normal LV systolic function; grade 1 diastolic dysfunction.  LEFT VENTRICLE PLAX 2D LVIDd:         4.75 cm      Diastology LVIDs:         3.36 cm      LV e' lateral:   8.59 cm/s LV PW:         1.30 cm      LV E/e' lateral: 10.8 LV IVS:        1.01 cm      LV e' medial:    6.74 cm/s LVOT diam:     2.10 cm      LV E/e' medial:  13.8 LV SV:         94 LV SV Index:   40 LVOT Area:     3.46 cm  LV Volumes (MOD) LV vol d, MOD A2C: 108.0 ml LV vol d, MOD A4C: 104.7 ml LV vol s, MOD A2C: 34.3 ml LV vol s, MOD A4C: 38.4 ml LV SV MOD A2C:     73.7 ml LV SV MOD A4C:     104.7 ml LV SV MOD BP:      71.2 ml RIGHT VENTRICLE             IVC RV S prime:     10.60 cm/s  IVC diam: 2.18 cm TAPSE (M-mode): 2.0 cm LEFT ATRIUM             Index       RIGHT ATRIUM           Index LA diam:        4.20 cm 1.79 cm/m  RA Area:     19.80 cm LA Vol (A2C):   54.7 ml 23.33 ml/m RA Volume:   59.10 ml  25.20 ml/m LA Vol (A4C):   49.4 ml 21.07  ml/m LA Biplane Vol: 53.3 ml 22.73 ml/m  AORTIC VALVE LVOT Vmax:   119.00 cm/s LVOT Vmean:  89.000 cm/s LVOT VTI:    0.271 m  AORTA Ao Root diam: 3.70 cm MITRAL VALVE MV Area (PHT): 2.91 cm    SHUNTS MV Decel Time: 261 msec    Systemic VTI:  0.27 m MV E velocity: 93.10 cm/s  Systemic Diam: 2.10 cm MV A velocity: 79.10 cm/s MV E/A ratio:  1.18 Kirk Ruths MD Electronically signed by Kirk Ruths MD Signature Date/Time: 05/17/2020/11:28:04 AM    Final    VAS [place] LOWER EXTREMITY VENOUS (DVT)  Result Date: 05/17/2020  Lower Venous DVTStudy Indications: Edema.  Limitations: Edema. Comparison Study: No prior study on file Performing Technologist: Sharion Dove RVS  Examination Guidelines: A complete evaluation includes B-mode imaging, spectral Doppler, color Doppler, and power Doppler as needed of all accessible portions of each vessel. Bilateral testing is considered an integral part of a complete examination. Limited examinations for reoccurring indications may be performed as noted. The reflux portion of the exam is performed with the patient in reverse Trendelenburg.  +---------+---------------+---------+-----------+----------+--------------+ RIGHT    CompressibilityPhasicitySpontaneityPropertiesThrombus Aging +---------+---------------+---------+-----------+----------+--------------+ CFV      Full           Yes      Yes                                 +---------+---------------+---------+-----------+----------+--------------+ SFJ      Full                                                        +---------+---------------+---------+-----------+----------+--------------+ FV Prox  Full                                                        +---------+---------------+---------+-----------+----------+--------------+ FV Mid   Full                                                        +---------+---------------+---------+-----------+----------+--------------+ FV DistalFull                                                         +---------+---------------+---------+-----------+----------+--------------+ PFV      Full                                                        +---------+---------------+---------+-----------+----------+--------------+ POP      Full           Yes      Yes                                 +---------+---------------+---------+-----------+----------+--------------+ PTV      Full                                                        +---------+---------------+---------+-----------+----------+--------------+ PERO  Not visualized +---------+---------------+---------+-----------+----------+--------------+   +----+---------------+---------+-----------+----------+--------------+ LEFTCompressibilityPhasicitySpontaneityPropertiesThrombus Aging +----+---------------+---------+-----------+----------+--------------+ CFV Full           Yes      Yes                                 +----+---------------+---------+-----------+----------+--------------+     Summary: RIGHT: - There is no evidence of deep vein thrombosis in the lower extremity. However, portions of this examination were limited- see technologist comments above.  Interstitial fluid noted throughout calf  LEFT: - No evidence of common femoral vein obstruction. - Ultrasound characteristics of enlarged lymph nodes noted in the groin.  *See table(s) above for measurements and observations. Electronically signed by Monica Martinez MD on 05/17/2020 at 1:13:50 PM.    Final      LOS: 2 days   Antonieta Pert, MD [facility]  05/18/2020, 1:30 PM

## 2020-05-18 NOTE — Progress Notes (Signed)
[facility] NEPHROLOGY PROGRESS NOTE  Assessment/ Plan: Pt is a 86 y.o. yo male with history of DM2, HTN, HL, RBBB, gout, CKD stage 5 followed by Dr Posey Pronto at [facility], s/p AVF placement April 2021 who presented to ED w/ SOB/fluid overload.   #CKD stage V with fluid overload: Responding with IV diuretics.  Still requiring around 1 to 2 L of oxygen.  Creatinine level stable today.  He denies any uremic symptoms except chronic fatigue.  Plan noted to move back to [place] to follow with his PCP and to find a nephrologist.  I do not see any urgent indication to start dialysis.  Hopefully we can switch back to oral Lasix 80 mg twice a day in next 1 to 2 days.  Strict ins and outs, monitor renal panel.  #Hyperkalemia: Discontinued losartan.  Treated with Lokelma and sodium bicarbonate today morning.  The repeat potassium level is normal.  #Hemodialysis access:R BC AVF created in April 2021 and revised/ superficialized on 6/28 by Dr. Carlis Abbott.  #Hypertension: Continue current antihypertensives.  On IV Lasix to manage volume.  BP acceptable.  #Anemia of CKD: Hemoglobin low.  I will check iron studies.  #CKD-MBD: Calcium phosphorus acceptable.  Needs outpatient follow-up.  Subjective: Seen and examined at bedside.  Reports that his shortness of breath is much better.  He still having cough.  Denies nausea vomiting, dysgeusia, anorexia.  Requiring minimal oxygen support.  Urine output of 1350 cc in 24 hours. Objective Vital signs in last 24 hours: Vitals:   05/18/20 0513 05/18/20 0911 05/18/20 1419 05/18/20 1421  BP: (!) 147/69  (!) 143/58   Pulse: 76  (!) 59   Resp: 18  14   Temp: 97.9 F (36.6 C)  98.2 F (36.8 C)   TempSrc: Oral  Oral   SpO2: 98% 98% 94% (!) 87%  Weight: 121.1 kg     Height: 5\' 9"  (1.753 m)      Weight change: 4.979 kg  Intake/Output Summary (Last 24 hours) at 05/18/2020 1514 Last data filed at 05/18/2020 1214 Gross per 24 hour  Intake 1024 ml  Output 1775 ml   Net -751 ml       Labs: Basic Metabolic Panel: Recent Labs  Lab 05/17/20 0412 05/18/20 0450 05/18/20 1351  NA 135 135 139  K 5.3* 6.1* 4.1  CL 100 101 99  CO2 25 21* 29  GLUCOSE 339* 243* 208*  BUN 51* QUANTITY NOT SUFFICIENT, UNABLE TO PERFORM TEST 69*  CREATININE 5.01* 5.31* 5.29*  CALCIUM 8.3* 8.8* 8.6*  PHOS  --   --  4.5   Liver Function Tests: Recent Labs  Lab 05/17/20 0412 05/18/20 0450 05/18/20 1351  AST 14* 24  --   ALT 9 11  --   ALKPHOS 49 45  --   BILITOT 0.7 0.5  --   PROT 6.8 6.8  --   ALBUMIN 3.2* 3.4* 3.5   No results for input(s): LIPASE, AMYLASE in the last 168 hours. No results for input(s): AMMONIA in the last 168 hours. CBC: Recent Labs  Lab 05/16/20 1548 05/17/20 0412 05/18/20 0450  WBC 7.8 4.4 8.5  NEUTROABS 5.4  --   --   HGB 9.0* 9.1* 8.9*  HCT 29.0* 29.3* 28.4*  MCV 86.8 87.7 85.8  PLT 184 213 209   Cardiac Enzymes: No results for input(s): CKTOTAL, CKMB, CKMBINDEX, TROPONINI in the last 168 hours. CBG: Recent Labs  Lab 05/17/20 1623 05/17/20 1625 05/17/20 2144 05/18/20 0740  05/18/20 1128  GLUCAP 260* 270* 320* 196* 244*    Iron Studies: No results for input(s): IRON, TIBC, TRANSFERRIN, FERRITIN in the last 72 hours. Studies/Results: CT CHEST WO CONTRAST  Result Date: 05/16/2020 CLINICAL DATA:  Cardiac arrest. EXAM: CT CHEST WITHOUT CONTRAST TECHNIQUE: Multidetector CT imaging of the chest was performed following the standard protocol without IV contrast. COMPARISON:  Radiograph earlier today. FINDINGS: Cardiovascular: Aortic atherosclerosis. Branch vessel tortuosity. Upper normal heart size with coronary artery calcifications. There is no pericardial effusion. Mediastinum/Nodes: Shotty mediastinal lymph nodes measuring up to 11 mm in the right lower paratracheal station. Limited assessment for hilar adenopathy on noncontrast exam. No axillary adenopathy. Patulous esophagus with intraluminal density distally, consistent  with ingested material. No visualized thyroid nodule. Lungs/Pleura: Mild emphysema. Small bilateral pleural effusions with compressive basilar atelectasis. Mild smooth septal thickening suggesting pulmonary edema. No evidence of pulmonary mass. No pneumothorax. Upper Abdomen: No acute finding. Moderate stool burden in the included colon. Diverticulosis at the splenic flexure. Punctate hepatic granuloma. Musculoskeletal: No sternal or anterior rib fracture. No acute osseous abnormalities. There is mild degenerative change in the spine. IMPRESSION: 1. Small bilateral pleural effusions with compressive basilar atelectasis. Mild smooth septal thickening suggesting pulmonary edema. 2. Shotty mediastinal lymph nodes are likely reactive. 3. Coronary artery calcifications and aortic atherosclerosis. Aortic Atherosclerosis (ICD10-I70.0) and Emphysema (ICD10-J43.9). Electronically Signed   By: Keith Rake M.D.   On: 05/16/2020 21:12   ECHOCARDIOGRAM COMPLETE  Result Date: 05/17/2020    ECHOCARDIOGRAM REPORT   Patient Name:   Ryan Jimenez Date of Exam: 05/17/2020 Medical Rec #:  [id number]        Height:       69.0 in Accession #:    [id number]       Weight:       269.4 lb Date of Birth:  Mar 15, 1932        BSA:          2.345 m Patient Age:    89 years         BP:           140/76 mmHg Patient Gender: M                HR:           63 bpm. Exam Location:  Inpatient Procedure: 2D Echo, Cardiac Doppler, Color Doppler and Intracardiac            Opacification Agent Indications:    R06.02 SOB  History:        Patient has prior history of Echocardiogram examinations, most                 recent 04/23/2013. Stroke, Signs/Symptoms:Chest Pain; Risk                 Factors:Diabetes, Hypertension and Former Smoker. Chemotherapy.                 Respiratory and renal failure.  Sonographer:    Roseanna Rainbow RDCS Referring Phys: [id number] [facility]  Sonographer Comments: Technically difficult study due to poor echo windows and patient is  morbidly obese. Image acquisition challenging due to patient body habitus and Image acquisition challenging due to respiratory motion. IMPRESSIONS  1. Definity used; normal LV systolic function; grade 1 diastolic dysfunction.  2. Left ventricular ejection fraction, by estimation, is 60 to 65%. The left ventricle has normal function. The left ventricle has no regional wall motion abnormalities. Left ventricular diastolic parameters are  consistent with Grade I diastolic dysfunction (impaired relaxation).  3. Right ventricular systolic function is normal. The right ventricular size is normal.  4. The mitral valve is normal in structure. Trivial mitral valve regurgitation. No evidence of mitral stenosis.  5. The aortic valve is tricuspid. Aortic valve regurgitation is not visualized. Mild aortic valve sclerosis is present, with no evidence of aortic valve stenosis.  6. The inferior vena cava is dilated in size with <50% respiratory variability, suggesting right atrial pressure of 15 mmHg. FINDINGS  Left Ventricle: Left ventricular ejection fraction, by estimation, is 60 to 65%. The left ventricle has normal function. The left ventricle has no regional wall motion abnormalities. Definity contrast agent was given IV to delineate the left ventricular  endocardial borders. The left ventricular internal cavity size was normal in size. There is no left ventricular hypertrophy. Left ventricular diastolic parameters are consistent with Grade I diastolic dysfunction (impaired relaxation). Right Ventricle: The right ventricular size is normal. Right ventricular systolic function is normal. Left Atrium: Left atrial size was normal in size. Right Atrium: Right atrial size was normal in size. Pericardium: There is no evidence of pericardial effusion. Mitral Valve: The mitral valve is normal in structure. Normal mobility of the mitral valve leaflets. Trivial mitral valve regurgitation. No evidence of mitral valve stenosis. Tricuspid  Valve: The tricuspid valve is normal in structure. Tricuspid valve regurgitation is trivial. No evidence of tricuspid stenosis. Aortic Valve: The aortic valve is tricuspid. Aortic valve regurgitation is not visualized. Mild aortic valve sclerosis is present, with no evidence of aortic valve stenosis. Pulmonic Valve: The pulmonic valve was not well visualized. Pulmonic valve regurgitation is not visualized. No evidence of pulmonic stenosis. Aorta: The aortic root is normal in size and structure. Venous: The inferior vena cava is dilated in size with less than 50% respiratory variability, suggesting right atrial pressure of 15 mmHg. IAS/Shunts: No atrial level shunt detected by color flow Doppler. Additional Comments: Definity used; normal LV systolic function; grade 1 diastolic dysfunction.  LEFT VENTRICLE PLAX 2D LVIDd:         4.75 cm      Diastology LVIDs:         3.36 cm      LV e' lateral:   8.59 cm/s LV PW:         1.30 cm      LV E/e' lateral: 10.8 LV IVS:        1.01 cm      LV e' medial:    6.74 cm/s LVOT diam:     2.10 cm      LV E/e' medial:  13.8 LV SV:         94 LV SV Index:   40 LVOT Area:     3.46 cm  LV Volumes (MOD) LV vol d, MOD A2C: 108.0 ml LV vol d, MOD A4C: 104.7 ml LV vol s, MOD A2C: 34.3 ml LV vol s, MOD A4C: 38.4 ml LV SV MOD A2C:     73.7 ml LV SV MOD A4C:     104.7 ml LV SV MOD BP:      71.2 ml RIGHT VENTRICLE             IVC RV S prime:     10.60 cm/s  IVC diam: 2.18 cm TAPSE (M-mode): 2.0 cm LEFT ATRIUM             Index       RIGHT ATRIUM  Index LA diam:        4.20 cm 1.79 cm/m  RA Area:     19.80 cm LA Vol (A2C):   54.7 ml 23.33 ml/m RA Volume:   59.10 ml  25.20 ml/m LA Vol (A4C):   49.4 ml 21.07 ml/m LA Biplane Vol: 53.3 ml 22.73 ml/m  AORTIC VALVE LVOT Vmax:   119.00 cm/s LVOT Vmean:  89.000 cm/s LVOT VTI:    0.271 m  AORTA Ao Root diam: 3.70 cm MITRAL VALVE MV Area (PHT): 2.91 cm    SHUNTS MV Decel Time: 261 msec    Systemic VTI:  0.27 m MV E velocity: 93.10 cm/s   Systemic Diam: 2.10 cm MV A velocity: 79.10 cm/s MV E/A ratio:  1.18 Kirk Ruths MD Electronically signed by Kirk Ruths MD Signature Date/Time: 05/17/2020/11:28:04 AM    Final    VAS [place] LOWER EXTREMITY VENOUS (DVT)  Result Date: 05/17/2020  Lower Venous DVTStudy Indications: Edema.  Limitations: Edema. Comparison Study: No prior study on file Performing Technologist: Sharion Dove RVS  Examination Guidelines: A complete evaluation includes B-mode imaging, spectral Doppler, color Doppler, and power Doppler as needed of all accessible portions of each vessel. Bilateral testing is considered an integral part of a complete examination. Limited examinations for reoccurring indications may be performed as noted. The reflux portion of the exam is performed with the patient in reverse Trendelenburg.  +---------+---------------+---------+-----------+----------+--------------+ RIGHT    CompressibilityPhasicitySpontaneityPropertiesThrombus Aging +---------+---------------+---------+-----------+----------+--------------+ CFV      Full           Yes      Yes                                 +---------+---------------+---------+-----------+----------+--------------+ SFJ      Full                                                        +---------+---------------+---------+-----------+----------+--------------+ FV Prox  Full                                                        +---------+---------------+---------+-----------+----------+--------------+ FV Mid   Full                                                        +---------+---------------+---------+-----------+----------+--------------+ FV DistalFull                                                        +---------+---------------+---------+-----------+----------+--------------+ PFV      Full                                                         +---------+---------------+---------+-----------+----------+--------------+  POP      Full           Yes      Yes                                 +---------+---------------+---------+-----------+----------+--------------+ PTV      Full                                                        +---------+---------------+---------+-----------+----------+--------------+ PERO                                                  Not visualized +---------+---------------+---------+-----------+----------+--------------+   +----+---------------+---------+-----------+----------+--------------+ LEFTCompressibilityPhasicitySpontaneityPropertiesThrombus Aging +----+---------------+---------+-----------+----------+--------------+ CFV Full           Yes      Yes                                 +----+---------------+---------+-----------+----------+--------------+     Summary: RIGHT: - There is no evidence of deep vein thrombosis in the lower extremity. However, portions of this examination were limited- see technologist comments above.  Interstitial fluid noted throughout calf  LEFT: - No evidence of common femoral vein obstruction. - Ultrasound characteristics of enlarged lymph nodes noted in the groin.  *See table(s) above for measurements and observations. Electronically signed by Monica Martinez MD on 05/17/2020 at 1:13:50 PM.    Final     Medications: Infusions: . furosemide 120 mg (05/18/20 1258)    Scheduled Medications: . allopurinol  100 mg Oral Daily  . amLODipine  10 mg Oral Daily  . aspirin EC  81 mg Oral Daily  . carvedilol  12.5 mg Oral BID  . dutasteride  0.5 mg Oral Daily  . fluticasone  2 spray Each Nare QHS  . guaiFENesin  600 mg Oral BID  . heparin  5,000 Units Subcutaneous Q8H  . hydrALAZINE  25 mg Oral BID  . insulin aspart  0-15 Units Subcutaneous TID WC  . insulin aspart  0-5 Units Subcutaneous QHS  . insulin aspart  4 Units Subcutaneous TID WC  . insulin  detemir  35 Units Subcutaneous QHS  . [START ON 05/19/2020] ipratropium-albuterol  3 mL Nebulization BID  . loratadine  10 mg Oral Daily  . mouth rinse  15 mL Mouth Rinse BID  . mirtazapine  7.5 mg Oral QPM  . multivitamin with minerals  1 tablet Oral Daily  . rosuvastatin  10 mg Oral QHS  . sodium zirconium cyclosilicate  10 g Oral Daily  . tamsulosin  0.8 mg Oral QHS    have reviewed scheduled and prn medications.  Physical Exam: General:NAD, comfortable Heart:RRR, s1s2 nl, no rubs Lungs: Coarse breath sound bibasal Abdomen:soft, Non-tender, non-distended Extremities: Bilateral LE edema+ Dialysis Access: Right upper extremity AV fistula has good thrill and bruit  Milica [place] Tanna Furry 05/18/2020,3:14 PM  LOS: 2 days  Pager: [phone]

## 2020-05-18 NOTE — TOC Initial Note (Signed)
Transition of Care [facility]) - Initial/Assessment Note    Patient Details  Name: Ryan Jimenez MRN: [id number] Date of Birth: [birth year]  [facility] CM/SW Contact:    Joaquin Courts, RN Phone Number: 05/18/2020, 10:19 AM  Clinical Narrative:     CM spoke with patient regarding recommendation for HHPT.  Patient reports he plans to return to [place] once he is discharge from the hospital.  Patient reports he came from [place] to [place] last 02-25-2023 when his wife died for the funeral.  His plan was to return to [place] right after, but his son ended up getting sick with Covid-19 and he has been here ever since as a result.  At this tie patient plans to return to [place] as soon as he is discharge and reports that he has already made an appointment with his pcp in [place].  Patient asked about receiving copies of his medical records and CM provided information about the process for requesting records.  Patient was informed that he can speak with his pcp regarding [facility] services in [place] as CM is unable to assist with this since he will discharge outside our service area.                Expected Discharge Plan: [place] Barriers to Discharge: Continued Medical Work up   Patient Goals and CMS Choice Patient states their goals for this hospitalization and ongoing recovery are:: to go back to [place]      Expected Discharge Plan and Services Expected Discharge Plan: [place]   Discharge Planning Services: CM Consult   Living arrangements for the past 2 months: Single Family Home                 DME Arranged: N/A DME Agency: NA       HH Arranged: NA HH Agency: NA        Prior Living Arrangements/Services Living arrangements for the past 2 months: Single Family Home Lives with:: Adult Children Patient language and need for interpreter reviewed:: Yes Do you feel safe going back to the place where you live?: Yes      Need for Family  Participation in Patient Care: No (Comment) Care giver support system in place?: Yes (comment)   Criminal Activity/Legal Involvement Pertinent to Current Situation/Hospitalization: No - Comment as needed  Activities of Daily Living Home Assistive Devices/Equipment: CBG Meter, Eyeglasses, Blood pressure cuff, Dentures (specify type) ADL Screening (condition at time of admission) Patient's cognitive ability adequate to safely complete daily activities?: Yes Is the patient deaf or have difficulty hearing?: No Does the patient have difficulty seeing, even when wearing glasses/contacts?: No Does the patient have difficulty concentrating, remembering, or making decisions?: No Patient able to express need for assistance with ADLs?: Yes Does the patient have difficulty dressing or bathing?: Yes Independently performs ADLs?: Yes (appropriate for developmental age) Does the patient have difficulty walking or climbing stairs?: Yes Weakness of Legs: Both Weakness of Arms/Hands: Both  Permission Sought/Granted                  Emotional Assessment Appearance:: Appears stated age Attitude/Demeanor/Rapport: Engaged Affect (typically observed): Accepting Orientation: : Oriented to Self, Oriented to Place, Oriented to  Time, Oriented to Situation   Psych Involvement: No (comment)  Admission diagnosis:  Acute respiratory failure ([place]) [J96.00] Acute hypoxemic respiratory failure ([place]) [J96.01] Patient Active Problem List   Diagnosis Date Noted  .  Acute respiratory failure ([place]) 05/16/2020  . CKD (chronic kidney disease) stage 3, GFR 30-59 ml/min 04/04/2015  . Chest pain 04/03/2015  . 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 Cerebral infarction ([place]) 04/23/2013  . Hidradenitis suppurativa of right axilla 04/05/2012  . Abscess of axillary region 04/04/2012  . Neutropenia 04/04/2012  . Pancytopenia due to chemotherapy ([place]) 04/04/2012  . Anemia 04/04/2012  . Generalized weakness  04/04/2012  . Physical deconditioning 04/04/2012  . Benign positional vertigo 11/28/2011  . Hypertension 11/23/2011  . Renal failure 11/23/2011  . Type 2 diabetes mellitus ([place]) 11/23/2011  . Hypercholesterolemia    PCP:  Nolene Ebbs, MD Pharmacy:   [facility] - [address] [place] [address] [place] [address] Phone: [phone] Fax: [place], [place] [address] Phone: [phone] Fax: [phone]     Social Determinants of Health (SDOH) Interventions    Readmission Risk Interventions Readmission Risk Prevention Plan 05/18/2020  Transportation Screening Complete  PCP or Specialist Appt within 3-5 Days Not Complete  Not Complete comments patient reports he plans to return to [place] at discharge and will follow-up with his pcp there (made appointment for the 8)  [place] or [place] Complete  Social Work Consult for [place] Planning/Counseling Complete  Palliative Care Screening Not Applicable  Medication Review Press photographer) Complete  Some recent data might be hidden

## 2020-05-19 ENCOUNTER — Inpatient Hospital Stay (HOSPITAL_COMMUNITY): Payer: Medicare Other

## 2020-05-19 LAB — COMPREHENSIVE METABOLIC PANEL
ALT: 12 U/L (ref 0–44)
AST: 21 U/L (ref 15–41)
Albumin: 3.1 g/dL — ABNORMAL LOW (ref 3.5–5.0)
Alkaline Phosphatase: 43 U/L (ref 38–126)
Anion gap: 12 (ref 5–15)
BUN: 66 mg/dL — ABNORMAL HIGH (ref 8–23)
CO2: 28 mmol/L (ref 22–32)
Calcium: 8.3 mg/dL — ABNORMAL LOW (ref 8.9–10.3)
Chloride: 100 mmol/L (ref 98–111)
Creatinine, Ser: 5.5 mg/dL — ABNORMAL HIGH (ref 0.61–1.24)
GFR calc Af Amer: 10 mL/min — ABNORMAL LOW (ref >60)
GFR calc non Af Amer: 9 mL/min — ABNORMAL LOW (ref >60)
Glucose, Bld: 84 mg/dL (ref 70–99)
Potassium: 4 mmol/L (ref 3.5–5.1)
Sodium: 140 mmol/L (ref 135–145)
Total Bilirubin: 0.3 mg/dL (ref 0.3–1.2)
Total Protein: 6.2 g/dL — ABNORMAL LOW (ref 6.5–8.1)

## 2020-05-19 LAB — GLUCOSE, CAPILLARY
Glucose-Capillary: 50 mg/dL — ABNORMAL LOW (ref 70–99)
Glucose-Capillary: 57 mg/dL — ABNORMAL LOW (ref 70–99)
Glucose-Capillary: 79 mg/dL (ref 70–99)
Glucose-Capillary: 136 mg/dL — ABNORMAL HIGH (ref 70–99)
Glucose-Capillary: 171 mg/dL — ABNORMAL HIGH (ref 70–99)
Glucose-Capillary: 140 mg/dL — ABNORMAL HIGH (ref 70–99)
Glucose-Capillary: 89 mg/dL (ref 70–99)
Glucose-Capillary: 204 mg/dL — ABNORMAL HIGH (ref 70–99)

## 2020-05-19 LAB — CBC
HCT: 28.7 % — ABNORMAL LOW (ref 39.0–52.0)
Hemoglobin: 9 g/dL — ABNORMAL LOW (ref 13.0–17.0)
MCH: 27.6 pg (ref 26.0–34.0)
MCHC: 31.4 g/dL (ref 30.0–36.0)
MCV: 88 fL (ref 80.0–100.0)
Platelets: 232 10*3/uL (ref 150–400)
RBC: 3.26 MIL/uL — ABNORMAL LOW (ref 4.22–5.81)
RDW: 14.7 % (ref 11.5–15.5)
WBC: 9 10*3/uL (ref 4.0–10.5)
nRBC: 0 % (ref 0.0–0.2)

## 2020-05-19 MED ORDER — INSULIN ASPART 100 UNIT/ML ~~LOC~~ SOLN
0.0000 [IU] | Freq: Three times a day (TID) | SUBCUTANEOUS | Status: DC
  Administered 2020-05-19 – 2020-05-20 (×2): 1 [IU] via SUBCUTANEOUS

## 2020-05-19 MED ORDER — INSULIN DETEMIR 100 UNIT/ML ~~LOC~~ SOLN
20.0000 [IU] | Freq: Every day | SUBCUTANEOUS | Status: DC
  Administered 2020-05-19: 20 [IU] via SUBCUTANEOUS
  Filled 2020-05-19: qty 0.2

## 2020-05-19 MED ORDER — IPRATROPIUM-ALBUTEROL 0.5-2.5 (3) MG/3ML IN SOLN
3.0000 mL | Freq: Four times a day (QID) | RESPIRATORY_TRACT | Status: DC
  Administered 2020-05-19: 3 mL via RESPIRATORY_TRACT
  Filled 2020-05-19: qty 3

## 2020-05-19 MED ORDER — FUROSEMIDE 10 MG/ML IJ SOLN
120.0000 mg | Freq: Once | INTRAVENOUS | Status: AC
  Administered 2020-05-19: 120 mg via INTRAVENOUS
  Filled 2020-05-19: qty 10

## 2020-05-19 MED ORDER — GLUCOSE 40 % PO GEL
1.0000 | ORAL | Status: AC
  Administered 2020-05-19: 37.5 g via ORAL
  Filled 2020-05-19: qty 1

## 2020-05-19 MED ORDER — FUROSEMIDE 40 MG PO TABS
80.0000 mg | ORAL_TABLET | Freq: Two times a day (BID) | ORAL | Status: DC
  Administered 2020-05-20: 80 mg via ORAL
  Filled 2020-05-19: qty 2

## 2020-05-19 MED ORDER — IPRATROPIUM-ALBUTEROL 0.5-2.5 (3) MG/3ML IN SOLN
3.0000 mL | Freq: Two times a day (BID) | RESPIRATORY_TRACT | Status: DC
  Administered 2020-05-20: 3 mL via RESPIRATORY_TRACT
  Filled 2020-05-19: qty 3

## 2020-05-19 MED ORDER — BUDESONIDE 0.25 MG/2ML IN SUSP
0.2500 mg | Freq: Two times a day (BID) | RESPIRATORY_TRACT | Status: DC
  Administered 2020-05-19 – 2020-05-20 (×3): 0.25 mg via RESPIRATORY_TRACT
  Filled 2020-05-19 (×3): qty 2

## 2020-05-19 NOTE — Progress Notes (Signed)
Occupational Therapy Treatment Patient Details Name: Ryan Jimenez MRN: [id number] DOB: 1932-07-06 Today's Date: 05/19/2020    History of present illness 87 y.o. 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   OT comments  Pt fatigued quickly this day. Pt did agree to stay in chair - explained this was good for pts endurance  Follow Up Recommendations  No OT follow up    Equipment Recommendations  None recommended by OT    Recommendations for Other Services      Precautions / Restrictions Precautions Precautions: Fall Precaution Comments: monitor o2, limited endurance       Mobility Bed Mobility               General bed mobility comments: pt OOB i chair  Transfers Overall transfer level: Needs assistance Equipment used: Rolling walker (2 wheeled) Transfers: Sit to/from [facility] Sit to Stand: Min guard Stand pivot transfers: Min guard       General transfer comment: cues for use of UEs to self assist; steady assist only    Balance Overall balance assessment: Needs assistance Sitting-balance support: No upper extremity supported;Feet supported Sitting balance-Leahy Scale: Good     Standing balance support: No upper extremity supported Standing balance-Leahy Scale: Fair Standing balance comment: wide BOS                           ADL either performed or assessed with clinical judgement   ADL Overall ADL's : Needs assistance/impaired     Grooming: Set up;Standing;Min guard                   Toilet Transfer: Min guard;BSC;Stand-pivot   Toileting- Clothing Manipulation and Hygiene: Min guard;Sit to/from stand        Functional mobility during ADLs: Min guard;Rolling walker       Vision   Vision Assessment?: No apparent visual deficits   Perception     Praxis      Cognition Arousal/Alertness: Awake/alert Behavior During Therapy: WFL for tasks assessed/performed Overall Cognitive Status: Within Functional Limits for tasks assessed                                                     Pertinent Vitals/ Pain       Pain Assessment: No/denies pain     Prior Functioning/Environment              Frequency  Min 2X/week        Progress Toward Goals  OT Goals(current goals can now be found in the care plan section)  Progress towards OT goals: Progressing toward goals     Plan Discharge plan remains appropriate       AM-PAC OT "6 Clicks" Daily Activity     Outcome Measure   Help from another person eating meals?: None Help from another person taking care of personal grooming?: A Little Help from another person toileting, which includes using toliet, bedpan, or urinal?: A Little Help from another person bathing (including washing, rinsing, drying)?: A Little Help from another person to put  on and taking off regular upper body clothing?: A Little Help from another person to put on and taking off regular lower body clothing?: A Little 6 Click Score: 19    End of Session Equipment Utilized During Treatment: Rolling walker  OT Visit Diagnosis: Muscle weakness (generalized) (M62.81)   Activity Tolerance Patient limited by fatigue   Patient Left in chair;with call bell/phone within reach;with chair alarm set   Nurse Communication Mobility status        Time: 1201-1219 OT Time Calculation (min): 18 min  Charges: OT General Charges $OT Visit: 1 Visit OT Treatments $Self Care/Home Management : 8-22 mins  Kari Baars, [place] [phone] Office- [phone]      Heer Justiss, Edwena Felty D 05/19/2020, 2:49  PM

## 2020-05-19 NOTE — Progress Notes (Addendum)
Nurse Tech informed me that patient CBG was 57. Ordered the hypoglycemia order set and it determined that patient needed dextrose 40% oral gel 37.5 g (1 tube). Student Nurse Lovena Le administered the dextrose gel and patient CBG rechecked 15 minutes later and patient CBG is 79. Notified charge nurse Harvest Dark of patient CBG. AMION page sent to Dr. Lupita Leash to make aware of hypoglycemic event.

## 2020-05-19 NOTE — Progress Notes (Signed)
PROGRESS NOTE    Ryan Jimenez  ZHG:[id number] DOB: 26-Dec-1931 DOA: 05/16/2020 PCP: Nolene Ebbs, MD   Chef Complaints: Shortness of breath  Brief Narrative: 86 y.o. 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  Shortness of breath has been going on for 3 months had seen by PCP in urgent care and has gone through multiple rounds of antibiotics and steroids and nebulizer treatment. He had called EMS in the last 2 to 3 weeks on 3 separate occasions. Prednisone did not help him much and sugar got in 500s. He does have leg swelling b/l LE and worse for baseline. Has cough with clear/ white phlegm. No report of chest pain, fever, vomiting, abdominal pain, focal weakness.  ED Course: Vitals stable was placed on oxygen and on room air desaturated at 88% per ED physician, blood work showed elevated BUN/creatinine at 4.5, EKG showed sinus rhythm RV no acute ischemic changes.  Chest x-ray showed mild patchy opacity of the right lung base developing pneumonia is not excluded, central pulmonary vascular congestion without pulmonary edema noted.  Patient was given Solu-Medrol 125 mg, bronchodilators nebulizer.  D-dimer was elevated 1.7, ED doctor felt low suspicion of PE, and had asymmetric swelling of the leg and duplex of the leg has been ordered. Given his acute on chronic dyspnea, new oxygen requirement admission was requested for further management and pulmonary evaluation Underwent CT chest showing pulmonary edema/venous congestion. Seen by nephrology Patient is being diuresed for fluid overload contributing to his symptoms  Subjective:  C/o wheezing this am Nebs helping some Sugar low in  50s reports sensitive to prednisone as it makes sugar go up Leg edema much better No chest pain  Assessment & Plan:  Acute hypoxic respiratory failure/dyspnea x3 months with acute worsening/history of recent treatment with multiple antibiotics also prednisone: 40-50-pack-year history of smoking/ANCA positive vasculitis no formal PFT done yet.  CT shows bilateral small pleural effusion with compressive basilar atelectasis septal thickening suggestive of pulmonary edema, and also bilateral ankle edema on exam and in the setting of CKD suspecting symptoms contributed by fluid overload from CKD.  No DVT on duplex.  Echocardiogram with normal EF grade 1 diastolic dysfunction-compensated.  Leg edema nicely improving with high-dose Lasix.  Appreciate nephrology input.  Continue diuresis and wean as tolerated having wheezing this morning likely multifactorial-increase her nebulizer added Pulmicort.   Wheezing this am- add pulmicort neb and duoneb q6hr cont prn albuterol neb. Received solumedrol 125 mg in ED on admission but sugar got worse with that. If does not improve will try steroid and adjust insulin. Check CXR.  Hyperkalemia with CKD and hyperglycemia.resolved with lokelma and bicarb. Monitor. Losartan on hold  Hypercholesterolemia: Continue home statin.   Hypertension:  BP is well controlled multiple home meds amlodipine, Coreg, hydralazine but holding ARB  Mild AKI on CKD stage V not on HD: Slight bump in creatinine overall at baseline baseline creatinine 4-5 BUN in 50s: Appreciate nephrology input regarding diuresis. He had recent superficialization of AV fistula by Dr. Carlis Abbott.  Recent Labs  Lab 05/16/20 1548 05/17/20 0412 05/18/20 0450 05/18/20 1351 05/19/20 0355  BUN 50* 51* QUANTITY NOT SUFFICIENT, UNABLE TO PERFORM TEST 69* 66*  CREATININE 4.50* 5.01* 5.31* 5.29* 5.50*    Type 2 diabetes mellitus on  long-term insulin with uncontrolled hyperglycemia.  A1c at 8. Initially with  Hyperglycemia contributed by IV steroids  Received in the ED. Increased bedtime Lantus to 35 units (normally takes 35 to 40 units at home) also received extra insulin Premeal insulin.  Overnight hypoglycemic 50.  Stop Premeal insulin, cut back insulin at bedtime to 25 units and sliding scale insulin modified.  Monitor blood sugar very closely.  BPH: Continue Flomax/Avodart   Physical deconditioning: PT OT to continue.    Morbid obesity with BMI 39.  Follow-up with PCP for weight loss.  Anemia of chronic disease in the setting of CKD.  Hemoglobin is stable.  Monitor as below.  Recent Labs  Lab 05/16/20 1548 05/17/20 0412 05/18/20 0450 05/19/20 0355  HGB 9.0* 9.1* 8.9* 9.0*  HCT 29.0* 29.3* 28.4* 28.7*    DVT prophylaxis: heparin injection 5,000 Units Start: 05/16/20 2200 Code Status: Full Code Family Communication: plan of care discussed with patient at bedside.  Status is: Inpatient  Remains inpatient appropriate because:IV treatments appropriate due to intensity of illness or inability to take PO, Inpatient level of care appropriate due to severity of illness and For ongoing shortness of breath, further speciality consultation   Dispo: The patient is from: Home              Anticipated d/c is to: Home              Anticipated d/c date is:1- 2 days              Patient currently is not medically stable to d/c.  Once shortness of and wheezing along with  volume status improves and if okay with nephrology  Nutrition: Diet Order            Diet renal/carb modified with fluid restriction Diet-HS Snack? Nothing; Fluid restriction: 1200 mL Fluid; Room service appropriate? Yes; Fluid consistency: Thin  Diet effective now                       Body mass index is 39.36 kg/m. Consultants: Nephrology, PCCM  Procedures:see note Microbiology:see note  Medications: Scheduled Meds: . allopurinol  100 mg Oral Daily  . amLODipine  10 mg Oral Daily  . aspirin EC  81 mg Oral  Daily  . budesonide (PULMICORT) nebulizer solution  0.25 mg Nebulization BID  . carvedilol  12.5 mg Oral BID  . dutasteride  0.5 mg Oral Daily  . fluticasone  2 spray Each Nare QHS  . guaiFENesin  600 mg Oral BID  . heparin  5,000 Units Subcutaneous Q8H  . hydrALAZINE  25 mg Oral BID  . insulin aspart  0-5 Units Subcutaneous QHS  . insulin aspart  0-9 Units Subcutaneous TID WC  . insulin detemir  20 Units Subcutaneous QHS  . ipratropium-albuterol  3 mL Nebulization BID  . loratadine  10 mg Oral Daily  . mouth rinse  15 mL Mouth Rinse BID  . mirtazapine  7.5 mg Oral QPM  . multivitamin with minerals  1 tablet Oral Daily  . rosuvastatin  10 mg Oral QHS  . sodium zirconium cyclosilicate  10 g Oral Daily  . tamsulosin  0.8 mg Oral QHS   Continuous Infusions: . furosemide 120 mg (05/19/20 0036)    Antimicrobials: Anti-infectives (From admission, onward)   None       Objective: Vitals: Today's Vitals   05/19/20 0500 05/19/20 0530 05/19/20 0812 05/19/20 0842  BP:  (!) 129/51  Pulse:  (!) 57    Resp:  17    Temp:  98.2 F (36.8 C)    TempSrc:  Oral    SpO2:  98%  97%  Weight: 120.9 kg     Height:      PainSc:   0-No pain     Intake/Output Summary (Last 24 hours) at 05/19/2020 0859 Last data filed at 05/19/2020 0728 Gross per 24 hour  Intake 875 ml  Output 1800 ml  Net -925 ml   Filed Weights   05/17/20 0419 05/18/20 0513 05/19/20 0500  Weight: 122.2 kg 121.1 kg 120.9 kg   Weight change: -0.2 kg   Intake/Output from previous day: 07/05 0701 - 07/06 0700 In: 161 [P.O.:875] Out: 1900 [Urine:1900] Intake/Output this shift: Total I/O In: -  Out: 250 [Urine:250]  Examination:  General exam: AAOx3 , NAD, weak appearing. HEENT:Oral mucosa moist, Ear/Nose WNL grossly, dentition normal. Respiratory system: bilaterally wheezing+, MILD BASAL Crackles,no use of accessory muscle Cardiovascular system: S1 & S2 +, No JVD. Gastrointestinal system: Abdomen soft, NT,ND,  BS+ Nervous System:Alert, awake, moving extremities and grossly nonfocal Extremities: mild pitting ankle edema- much better, distal peripheral pulses palpable.  Skin: No rashes,no icterus. MSK: Normal muscle bulk,tone, power  Data Reviewed: I have personally reviewed following labs and imaging studies CBC: Recent Labs  Lab 05/16/20 1548 05/17/20 0412 05/18/20 0450 05/19/20 0355  WBC 7.8 4.4 8.5 9.0  NEUTROABS 5.4  --   --   --   HGB 9.0* 9.1* 8.9* 9.0*  HCT 29.0* 29.3* 28.4* 28.7*  MCV 86.8 87.7 85.8 88.0  PLT 184 213 209 096   Basic Metabolic Panel: Recent Labs  Lab 05/16/20 1548 05/17/20 0412 05/18/20 0450 05/18/20 1351 05/19/20 0355  NA 137 135 135 139 140  K 4.7 5.3* 6.1* 4.1 4.0  CL 99 100 101 99 100  CO2 28 25 21* 29 28  GLUCOSE 170* 339* 243* 208* 84  BUN 50* 51* QUANTITY NOT SUFFICIENT, UNABLE TO PERFORM TEST 69* 66*  CREATININE 4.50* 5.01* 5.31* 5.29* 5.50*  CALCIUM 8.3* 8.3* 8.8* 8.6* 8.3*  PHOS  --   --   --  4.5  --    GFR: Estimated Creatinine Clearance: 12.6 mL/min (A) (by C-G formula based on SCr of 5.5 mg/dL (H)). Liver Function Tests: Recent Labs  Lab 05/17/20 0412 05/18/20 0450 05/18/20 1351 05/19/20 0355  AST 14* 24  --  21  ALT 9 11  --  12  ALKPHOS 49 45  --  43  BILITOT 0.7 0.5  --  0.3  PROT 6.8 6.8  --  6.2*  ALBUMIN 3.2* 3.4* 3.5 3.1*   No results for input(s): LIPASE, AMYLASE in the last 168 hours. No results for input(s): AMMONIA in the last 168 hours. Coagulation Profile: No results for input(s): INR, PROTIME in the last 168 hours. Cardiac Enzymes: No results for input(s): CKTOTAL, CKMB, CKMBINDEX, TROPONINI in the last 168 hours. BNP (last 3 results) No results for input(s): PROBNP in the last 8760 hours. HbA1C: Recent Labs    05/16/20 1744  HGBA1C 8.1*   CBG: Recent Labs  Lab 05/18/20 1646 05/18/20 2112 05/19/20 0738 05/19/20 0749 05/19/20 0807  GLUCAP 121* 146* 57* 50* 79   Lipid Profile: No results for  input(s): CHOL, HDL, LDLCALC, TRIG, CHOLHDL, LDLDIRECT in the last 72 hours. Thyroid Function Tests: No results for input(s): TSH, T4TOTAL, FREET4, T3FREE, THYROIDAB in the last 72 hours. Anemia Panel: No results for input(s): VITAMINB12,  FOLATE, FERRITIN, TIBC, IRON, RETICCTPCT in the last 72 hours. Sepsis Labs: No results for input(s): PROCALCITON, LATICACIDVEN in the last 168 hours.  Recent Results (from the past 240 hour(s))  SARS Coronavirus 2 by RT PCR (hospital order, performed in [facility] hospital lab) Nasopharyngeal Nasopharyngeal Swab     Status: None   Collection Time: 05/16/20  3:48 PM   Specimen: Nasopharyngeal Swab  Result Value Ref Range Status   SARS Coronavirus 2 NEGATIVE NEGATIVE Final    Comment: (NOTE) SARS-CoV-2 target nucleic acids are NOT DETECTED.  The SARS-CoV-2 RNA is generally detectable in upper and lower respiratory specimens during the acute phase of infection. The lowest concentration of SARS-CoV-2 viral copies this assay can detect is 250 copies / mL. A negative result does not preclude SARS-CoV-2 infection and should not be used as the sole basis for treatment or other patient management decisions.  A negative result may occur with improper specimen collection / handling, submission of specimen other than nasopharyngeal swab, presence of viral mutation(s) within the areas targeted by this assay, and inadequate number of viral copies (<250 copies / mL). A negative result must be combined with clinical observations, patient history, and epidemiological information.  Fact Sheet for Patients:   StrictlyIdeas.no  Fact Sheet for Healthcare Providers: BankingDealers.co.za  This test is not yet approved or  cleared by the [place] FDA and has been authorized for detection and/or diagnosis of SARS-CoV-2 by FDA under an Emergency Use Authorization (EUA).  This EUA will remain in effect (meaning this  test can be used) for the duration of the COVID-19 declaration under Section 564(b)(1) of the Act, 21 U.S.C. section 360bbb-3(b)(1), unless the authorization is terminated or revoked sooner.  Performed at [facility], [place] [address], [place], [place] [address]       Radiology Studies: ECHOCARDIOGRAM COMPLETE  Result Date: 05/17/2020    ECHOCARDIOGRAM REPORT   Patient Name:   Ryan Jimenez Date of Exam: 05/17/2020 Medical Rec #:  [id number]        Height:       69.0 in Accession #:    [id number]       Weight:       269.4 lb Date of Birth:  1932/10/18        BSA:          2.345 m Patient Age:    4 years         BP:           140/76 mmHg Patient Gender: M                HR:           63 bpm. Exam Location:  Inpatient Procedure: 2D Echo, Cardiac Doppler, Color Doppler and Intracardiac            Opacification Agent Indications:    R06.02 SOB  History:        Patient has prior history of Echocardiogram examinations, most                 recent 04/23/2013. Stroke, Signs/Symptoms:Chest Pain; Risk                 Factors:Diabetes, Hypertension and Former Smoker. Chemotherapy.                 Respiratory and renal failure.  Sonographer:    Roseanna Rainbow RDCS Referring Phys: [id number] [facility]  Sonographer Comments: Technically difficult study due to poor  echo windows and patient is morbidly obese. Image acquisition challenging due to patient body habitus and Image acquisition challenging due to respiratory motion. IMPRESSIONS  1. Definity used; normal LV systolic function; grade 1 diastolic dysfunction.  2. Left ventricular ejection fraction, by estimation, is 60 to 65%. The left ventricle has normal function. The left ventricle has no regional wall motion abnormalities. Left ventricular diastolic parameters are consistent with Grade I diastolic dysfunction (impaired relaxation).  3. Right ventricular systolic function is normal. The right ventricular size is normal.  4. The mitral valve is normal  in structure. Trivial mitral valve regurgitation. No evidence of mitral stenosis.  5. The aortic valve is tricuspid. Aortic valve regurgitation is not visualized. Mild aortic valve sclerosis is present, with no evidence of aortic valve stenosis.  6. The inferior vena cava is dilated in size with <50% respiratory variability, suggesting right atrial pressure of 15 mmHg. FINDINGS  Left Ventricle: Left ventricular ejection fraction, by estimation, is 60 to 65%. The left ventricle has normal function. The left ventricle has no regional wall motion abnormalities. Definity contrast agent was given IV to delineate the left ventricular  endocardial borders. The left ventricular internal cavity size was normal in size. There is no left ventricular hypertrophy. Left ventricular diastolic parameters are consistent with Grade I diastolic dysfunction (impaired relaxation). Right Ventricle: The right ventricular size is normal. Right ventricular systolic function is normal. Left Atrium: Left atrial size was normal in size. Right Atrium: Right atrial size was normal in size. Pericardium: There is no evidence of pericardial effusion. Mitral Valve: The mitral valve is normal in structure. Normal mobility of the mitral valve leaflets. Trivial mitral valve regurgitation. No evidence of mitral valve stenosis. Tricuspid Valve: The tricuspid valve is normal in structure. Tricuspid valve regurgitation is trivial. No evidence of tricuspid stenosis. Aortic Valve: The aortic valve is tricuspid. Aortic valve regurgitation is not visualized. Mild aortic valve sclerosis is present, with no evidence of aortic valve stenosis. Pulmonic Valve: The pulmonic valve was not well visualized. Pulmonic valve regurgitation is not visualized. No evidence of pulmonic stenosis. Aorta: The aortic root is normal in size and structure. Venous: The inferior vena cava is dilated in size with less than 50% respiratory variability, suggesting right atrial pressure  of 15 mmHg. IAS/Shunts: No atrial level shunt detected by color flow Doppler. Additional Comments: Definity used; normal LV systolic function; grade 1 diastolic dysfunction.  LEFT VENTRICLE PLAX 2D LVIDd:         4.75 cm      Diastology LVIDs:         3.36 cm      LV e' lateral:   8.59 cm/s LV PW:         1.30 cm      LV E/e' lateral: 10.8 LV IVS:        1.01 cm      LV e' medial:    6.74 cm/s LVOT diam:     2.10 cm      LV E/e' medial:  13.8 LV SV:         94 LV SV Index:   40 LVOT Area:     3.46 cm  LV Volumes (MOD) LV vol d, MOD A2C: 108.0 ml LV vol d, MOD A4C: 104.7 ml LV vol s, MOD A2C: 34.3 ml LV vol s, MOD A4C: 38.4 ml LV SV MOD A2C:     73.7 ml LV SV MOD A4C:     104.7 ml LV  SV MOD BP:      71.2 ml RIGHT VENTRICLE             IVC RV S prime:     10.60 cm/s  IVC diam: 2.18 cm TAPSE (M-mode): 2.0 cm LEFT ATRIUM             Index       RIGHT ATRIUM           Index LA diam:        4.20 cm 1.79 cm/m  RA Area:     19.80 cm LA Vol (A2C):   54.7 ml 23.33 ml/m RA Volume:   59.10 ml  25.20 ml/m LA Vol (A4C):   49.4 ml 21.07 ml/m LA Biplane Vol: 53.3 ml 22.73 ml/m  AORTIC VALVE LVOT Vmax:   119.00 cm/s LVOT Vmean:  89.000 cm/s LVOT VTI:    0.271 m  AORTA Ao Root diam: 3.70 cm MITRAL VALVE MV Area (PHT): 2.91 cm    SHUNTS MV Decel Time: 261 msec    Systemic VTI:  0.27 m MV E velocity: 93.10 cm/s  Systemic Diam: 2.10 cm MV A velocity: 79.10 cm/s MV E/A ratio:  1.18 Kirk Ruths MD Electronically signed by Kirk Ruths MD Signature Date/Time: 05/17/2020/11:28:04 AM    Final      LOS: 3 days   Antonieta Pert, MD [facility]  05/19/2020, 8:59 AM

## 2020-05-19 NOTE — Progress Notes (Signed)
[facility] NEPHROLOGY PROGRESS NOTE  Assessment/ Plan: Pt is a 86 y.o. 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   #CKD stage V with fluid overload: Responding with IV diuretics.  Nonoliguric.  Lower extremity edema significantly improved however he still has some crackles and shortness of breath.  Requiring some oxygen.  The repeat chest x-ray with some atelectasis or infiltration. Noted creatinine level mildly increased to 5.5 today.  We will continue IV Lasix tonight and recommend to change to oral Lasix 80 mg twice daily starting from tomorrow morning.  Plan noted to move back to [place] to follow with his PCP and to find a nephrologist.  No uremic features, I do not see any urgent indication to start dialysis.  Strict ins and outs, monitor renal panel.  #Hyperkalemia: Discontinued losartan.  Required Lokelma yesterday.  Potassium level is now normal.  #Hemodialysis access:R BC AVF created in April 2021 and revised/ superficialized on 6/28 by Dr. Carlis Abbott.  #Hypertension: Continue current antihypertensives.  On IV Lasix to manage volume.  BP acceptable.  #Anemia of CKD: Hemoglobin low.  I will check iron studies.  #CKD-MBD: Calcium phosphorus acceptable.    Subjective: Seen and examined at bedside.  Noted some respiratory distress this morning.  Volume status improving.  Using around 2 L of oxygen.  Chest x-ray with no pulmonary edema.  Currently denies chills, nausea, vomiting, chest pain, shortness of breath.  No weakness, dysgeusia.  Urine output around 1.9 L in 24 hours. Objective Vital signs in last 24 hours: Vitals:   05/19/20 0500 05/19/20 0530 05/19/20 0842 05/19/20 1350  BP:  (!) 129/51  (!) 140/57  Pulse:  (!) 57  66  Resp:  17  18  Temp:  98.2 F (36.8 C)  98.4 F (36.9 C)  TempSrc:  Oral    SpO2:  98% 97% 100%  Weight:  120.9 kg     Height:       Weight change: -0.2 kg  Intake/Output Summary (Last 24 hours) at 05/19/2020 1606 Last data filed at 05/19/2020 1136 Gross per 24 hour  Intake 1173 ml  Output 1725 ml  Net -552 ml       Labs: Basic Metabolic Panel: Recent Labs  Lab 05/18/20 0450 05/18/20 1351 05/19/20 0355  NA 135 139 140  K 6.1* 4.1 4.0  CL 101 99 100  CO2 21* 29 28  GLUCOSE 243* 208* 84  BUN QUANTITY NOT SUFFICIENT, UNABLE TO PERFORM TEST 69* 66*  CREATININE 5.31* 5.29* 5.50*  CALCIUM 8.8* 8.6* 8.3*  PHOS  --  4.5  --    Liver Function Tests: Recent Labs  Lab 05/17/20 0412 05/17/20 0412 05/18/20 0450 05/18/20 1351 05/19/20 0355  AST 14*  --  24  --  21  ALT 9  --  11  --  12  ALKPHOS 49  --  45  --  43  BILITOT 0.7  --  0.5  --  0.3  PROT 6.8  --  6.8  --  6.2*  ALBUMIN 3.2*   < > 3.4* 3.5 3.1*   < > = values in this interval not displayed.   No results for input(s): LIPASE, AMYLASE in the last 168 hours. No results for input(s): AMMONIA in the last 168 hours. CBC: Recent Labs  Lab 05/16/20 1548 05/16/20 1548  05/17/20 0412 05/18/20 0450 05/19/20 0355  WBC 7.8   < > 4.4 8.5 9.0  NEUTROABS 5.4  --   --   --   --   HGB 9.0*   < > 9.1* 8.9* 9.0*  HCT 29.0*   < > 29.3* 28.4* 28.7*  MCV 86.8  --  87.7 85.8 88.0  PLT 184   < > 213 209 232   < > = values in this interval not displayed.   Cardiac Enzymes: No results for input(s): CKTOTAL, CKMB, CKMBINDEX, TROPONINI in the last 168 hours. CBG: Recent Labs  Lab 05/19/20 0749 05/19/20 0807 05/19/20 0911 05/19/20 1014 05/19/20 1148  GLUCAP 50* 79 136* 171* 140*    Iron Studies: No results for input(s): IRON, TIBC, TRANSFERRIN, FERRITIN in the last 72 hours. Studies/Results: DG Chest Port 1 View  Result Date: 05/19/2020 CLINICAL DATA:  Shortness of breath. EXAM: PORTABLE CHEST 1 VIEW COMPARISON:  May 16, 2020. FINDINGS: Stable cardiomediastinal silhouette. No pneumothorax is noted. Mild bibasilar  subsegmental atelectasis or infiltrates are noted. No significant pleural effusions are noted. Bony thorax is unremarkable. IMPRESSION: Mild bibasilar subsegmental atelectasis or infiltrates are noted. Electronically Signed   By: Marijo Conception M.D.   On: 05/19/2020 09:50    Medications: Infusions: . furosemide 120 mg (05/19/20 1152)    Scheduled Medications: . allopurinol  100 mg Oral Daily  . amLODipine  10 mg Oral Daily  . aspirin EC  81 mg Oral Daily  . budesonide (PULMICORT) nebulizer solution  0.25 mg Nebulization BID  . carvedilol  12.5 mg Oral BID  . dutasteride  0.5 mg Oral Daily  . fluticasone  2 spray Each Nare QHS  . guaiFENesin  600 mg Oral BID  . heparin  5,000 Units Subcutaneous Q8H  . hydrALAZINE  25 mg Oral BID  . insulin aspart  0-5 Units Subcutaneous QHS  . insulin aspart  0-9 Units Subcutaneous TID WC  . insulin detemir  20 Units Subcutaneous QHS  . [START ON 05/20/2020] ipratropium-albuterol  3 mL Nebulization BID  . loratadine  10 mg Oral Daily  . mouth rinse  15 mL Mouth Rinse BID  . mirtazapine  7.5 mg Oral QPM  . multivitamin with minerals  1 tablet Oral Daily  . rosuvastatin  10 mg Oral QHS  . tamsulosin  0.8 mg Oral QHS    have reviewed scheduled and prn medications.  Physical Exam: General:NAD, comfortable Heart:RRR, s1s2 nl, no rubs Lungs: Bibasal coarse breath sound Abdomen:soft, Non-tender, non-distended Extremities: Improved lower extremity edema Dialysis Access: Right upper extremity AV fistula has good thrill and bruit  Lizeth Bencosme Tanna Furry 05/19/2020,4:06 PM  LOS: 3 days  Pager: [phone]

## 2020-05-19 NOTE — Progress Notes (Signed)
Physical Therapy Treatment Patient Details Name: Ryan Jimenez MRN: [id number] DOB: 02/04/1932 Today's Date: 05/19/2020    History of Present Illness 86 y.o. 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    PT Comments    The patient mobil;ized from bed to chair, Encouraged to ambulate, patient declined. Plans to go home to family, then back to [place].Continue PT for ambulation.  SPO2 100% on RA.  Follow Up Recommendations  Home health PT     Equipment Recommendations  None recommended by PT    Recommendations for Other Services       Precautions / Restrictions Precautions Precautions: Fall Precaution Comments: monitor o2, limited endurance    Mobility  Bed Mobility               General bed mobility comments: Used bed rail to transfer to side of bed.  Transfers Overall transfer level: Needs assistance Equipment used: Rolling walker (2 wheeled) Transfers: Sit to/from Stand Sit to Stand: Min guard         General transfer comment: cues for use of UEs to self assist; steady assist only  Ambulation/Gait Ambulation/Gait assistance: Min guard Gait Distance (Feet): 5 Feet Assistive device: Rolling walker (2 wheeled) Gait Pattern/deviations: Step-to pattern;Step-through pattern;Decreased step length - right;Decreased step length - left;Shuffle;Trunk flexed;Wide base of support     General Gait Details: declined to ambualte farther than recliner/   Stairs             Wheelchair Mobility    Modified Rankin (Stroke Patients Only)       Balance Overall balance assessment: Needs assistance Sitting-balance support: No upper extremity supported;Feet  supported Sitting balance-Leahy Scale: Good     Standing balance support: No upper extremity supported Standing balance-Leahy Scale: Fair Standing balance comment: wide BOS                            Cognition Arousal/Alertness: Awake/alert Behavior During Therapy: WFL for tasks assessed/performed                                          Exercises      General Comments        Pertinent Vitals/Pain Pain Assessment: No/denies pain    Home Living                      Prior Function            PT Goals (current goals can now be found in the care plan section) Progress towards PT goals: Progressing toward goals    Frequency    Min 3X/week      PT Plan Current plan remains appropriate    Co-evaluation              AM-PAC PT "6 Clicks" Mobility   Outcome Measure  Help needed turning from your back to your side while in a flat bed without using bedrails?: None Help needed moving from lying on your back to sitting on the side of a flat bed without using bedrails?: None Help needed moving to and from a bed to  a chair (including a wheelchair)?: A Little Help needed standing up from a chair using your arms (e.g., wheelchair or bedside chair)?: A Little Help needed to walk in hospital room?: A Lot Help needed climbing 3-5 steps with a railing? : A Lot 6 Click Score: 18    End of Session Equipment Utilized During Treatment: Gait belt Activity Tolerance: Patient limited by fatigue Patient left: in chair;with call bell/phone within reach;with chair alarm set;with nursing/sitter in room Nurse Communication: Mobility status PT Visit Diagnosis: Unsteadiness on feet (R26.81);Difficulty in walking, not elsewhere classified (R26.2)     Time: 5834-6219 PT Time Calculation (min) (ACUTE ONLY): 17 min  Charges:                        Tresa Endo PT Acute Rehabilitation Services Pager [phone] Office  [phone]    Claretha Cooper 05/19/2020, 12:50 PM

## 2020-05-20 DIAGNOSIS — E8779 Other fluid overload: Principal | ICD-10-CM

## 2020-05-20 LAB — GLUCOSE, CAPILLARY
Glucose-Capillary: 123 mg/dL — ABNORMAL HIGH (ref 70–99)
Glucose-Capillary: 114 mg/dL — ABNORMAL HIGH (ref 70–99)

## 2020-05-20 LAB — RENAL FUNCTION PANEL
Albumin: 3.3 g/dL — ABNORMAL LOW (ref 3.5–5.0)
Anion gap: 12 (ref 5–15)
BUN: 70 mg/dL — ABNORMAL HIGH (ref 8–23)
CO2: 28 mmol/L (ref 22–32)
Calcium: 8.1 mg/dL — ABNORMAL LOW (ref 8.9–10.3)
Chloride: 98 mmol/L (ref 98–111)
Creatinine, Ser: 5.25 mg/dL — ABNORMAL HIGH (ref 0.61–1.24)
GFR calc Af Amer: 11 mL/min — ABNORMAL LOW (ref >60)
GFR calc non Af Amer: 9 mL/min — ABNORMAL LOW (ref >60)
Glucose, Bld: 170 mg/dL — ABNORMAL HIGH (ref 70–99)
Phosphorus: 5.5 mg/dL — ABNORMAL HIGH (ref 2.5–4.6)
Potassium: 3.8 mmol/L (ref 3.5–5.1)
Sodium: 138 mmol/L (ref 135–145)

## 2020-05-20 LAB — FERRITIN: Ferritin: 149 ng/mL (ref 24–336)

## 2020-05-20 LAB — IRON AND TIBC
Iron: 43 ug/dL — ABNORMAL LOW (ref 45–182)
Saturation Ratios: 21 % (ref 17.9–39.5)
TIBC: 204 ug/dL — ABNORMAL LOW (ref 250–450)
UIBC: 161 ug/dL

## 2020-05-20 LAB — MRSA PCR SCREENING: MRSA by PCR: NEGATIVE

## 2020-05-20 MED ORDER — FUROSEMIDE 80 MG PO TABS: 80.0000 mg | ORAL_TABLET | Freq: Two times a day (BID) | ORAL | 0 refills | Status: DC

## 2020-05-20 MED ORDER — ALBUTEROL SULFATE HFA 108 (90 BASE) MCG/ACT IN AERS: 2.0000 | INHALATION_SPRAY | Freq: Four times a day (QID) | RESPIRATORY_TRACT | 0 refills | Status: AC | PRN

## 2020-05-20 MED ORDER — ALBUTEROL SULFATE (2.5 MG/3ML) 0.083% IN NEBU
2.5000 mg | INHALATION_SOLUTION | Freq: Four times a day (QID) | RESPIRATORY_TRACT | 0 refills | Status: AC | PRN
Start: 2020-05-20 — End: 2020-06-19

## 2020-05-20 MED ORDER — GUAIFENESIN ER 600 MG PO TB12: 600.0000 mg | ORAL_TABLET | Freq: Two times a day (BID) | ORAL | 0 refills | Status: AC

## 2020-05-20 NOTE — Progress Notes (Signed)
Went over discharge instructions w/ patient. Patient verbalized understanding and agreed w/ plan.

## 2020-05-20 NOTE — Progress Notes (Signed)
Occupational Therapy Treatment Patient Details Name: Ryan Jimenez MRN: [id number] DOB: 27-Dec-1931 Today's Date: 05/20/2020    History of present illness 86 y.o. 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   OT comments  Patient progressing and showed improved ability to stand for grooming at sink with education and cues for energy conservation techniques, rest breaks as needed and pursed lip breathing. Patient limited by decreased dynamic standing balance with need of up to Mod As to correct loss of balance while ambulating in hallway, as well as impaired activity tolerance, along with deficits noted below. Pt continues to demonstrate good rehab potential and would benefit from continued skilled OT to increase safety and independence with ADLs and functional transfers to allow pt to return home safely and reduce caregiver burden and fall risk.  Pt anticipates return home to daughter's today and moving back to [place] in a few days.  Home health PT was discussed with pt who agreed to f/u once in [place].    Follow Up Recommendations  No OT follow up    Equipment Recommendations  None recommended by OT    Recommendations for Other Services      Precautions / Restrictions Precautions Precautions: Fall Precaution Comments: monitor o2, limited endurance Restrictions Weight Bearing Restrictions: No       Mobility Bed Mobility Overal bed mobility: Modified Independent                Transfers Overall transfer level: Needs assistance Equipment used: Rolling walker (2 wheeled) Transfers: Sit to/from [facility] Sit to Stand: Min guard Stand pivot transfers: Min  guard       General transfer comment: Stand to sit in recliner x 3 with Min As and verbal cues for BUE reach back to control descent.    Balance Overall balance assessment: Needs assistance Sitting-balance support: No upper extremity supported;Feet supported Sitting balance-Leahy Scale: Good     Standing balance support: No upper extremity supported Standing balance-Leahy Scale: Fair (Fair static and Poor dynamic) Standing balance comment: wide BOS                           ADL either performed or assessed with clinical judgement   ADL Overall ADL's : Needs assistance/impaired     Grooming: Set up;Standing;Min guard Grooming Details (indicate cue type and reason): Pt stood at sink for oral rinse and hand hygiene with seated rest break in b/t and O2 sats monitored. Pt leans on forarms for energy conservation, but able to stand upright with cues for ~30 seconds of grooming.         Upper Body Dressing : Set up;Sitting Upper Body Dressing Details (indicate cue type and reason): Sitting in recliner to don post gown. Lower Body Dressing: Cueing for compensatory techniques;Total assistance Lower Body Dressing Details (indicate cue type and reason): Pt unable to perform figure 4 position at EOB and stated that his daughter assists at baseline with socks shoes. Pt also reports owning a sock aid, long shoe horn and reacher and able to describe functional uses correctly. Total As to don socks without adaptive equip.             Functional mobility during ADLs: Rolling walker;Minimal assistance;Moderate  assistance;Cueing for safety General ADL Comments: Pt ambulated in hallway ~60' with RW with Min As except for increase to moderate assist due to loss of balance to right side. Verbal cues for safety and pacing.     Vision   Vision Assessment?: No apparent visual deficits              Cognition Arousal/Alertness: Awake/alert Behavior During Therapy: WFL for tasks  assessed/performed Overall Cognitive Status: Within Functional Limits for tasks assessed                                                            Pertinent Vitals/ Pain       Pain Assessment: No/denies pain                                                          Frequency  Min 2X/week        Progress Toward Goals  OT Goals(current goals can now be found in the care plan section)  Progress towards OT goals: Progressing toward goals  Acute Rehab OT Goals Patient Stated Goal: Improve endurance  Plan Discharge plan remains appropriate    Co-evaluation          OT goals addressed during session: ADL's and self-care (Activity tolerance)      AM-PAC OT "6 Clicks" Daily Activity     Outcome Measure   Help from another person eating meals?: None Help from another person taking care of personal grooming?: A Little Help from another person toileting, which includes using toliet, bedpan, or urinal?: A Little Help from another person bathing (including washing, rinsing, drying)?: A Little Help from another person to put on and taking off regular upper body clothing?: None Help from another person to put on and taking off regular lower body clothing?: A Little 6 Click Score: 20    End of Session Equipment Utilized During Treatment: Rolling walker;Gait belt  OT Visit Diagnosis: Muscle weakness (generalized) (M62.81)   Activity Tolerance Patient limited by fatigue;Patient tolerated treatment well   Patient Left in chair;with call bell/phone within reach;with chair alarm set   Nurse Communication Mobility status        Time: 7654-6503 OT Time Calculation (min): 39 min  Charges: OT General Charges $OT Visit: 1 Visit OT Treatments $Self Care/Home Management : 8-22 mins $Therapeutic Activity: 23-37 mins  Anderson Malta, OT Acute Rehab Services Office: [phone] 05/20/2020    Julien Girt 05/20/2020, 12:03  PM

## 2020-05-20 NOTE — Care Management Important Message (Signed)
Important Message  Patient Details IM Letter given to Roque Lias SW Case Manager to present to the Patient Name: Ryan Jimenez MRN: [id number] Date of Birth: [birth year]   Medicare Important Message Given:  Yes     Kerin Salen 05/20/2020, 10:23 AM

## 2020-05-20 NOTE — Discharge Summary (Signed)
Physician Discharge Summary  Ryan Jimenez XIP:[id number] DOB: June 06, 1932 DOA: 05/16/2020  PCP: Nolene Ebbs, MD  Admit date: 05/16/2020 Discharge date: 05/20/2020  Admitted From: home Disposition:  home  Recommendations for Outpatient Follow-up:  Follow up with PCP in 1-2 weeks Please obtain BMP/CBC in one week Please follow up on the following pending results:  Home Health:no  Equipment/Devices: none  Discharge Condition: Stable Code Status: full Diet recommendation:  Diet Order             Diet - low sodium heart healthy           Diet renal/carb modified with fluid restriction Diet-HS Snack? Nothing; Fluid restriction: 1200 mL Fluid; Room service appropriate? Yes; Fluid consistency: Thin  Diet effective now                    Brief/Interim Summary:87 y.o. 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  Shortness of breath has been going on for 3 months had seen by PCP in urgent care and has gone through multiple rounds of antibiotics and steroids and nebulizer treatment. He had called EMS in the last 2 to 3 weeks on 3 separate occasions. Prednisone did not help him much and sugar got in 500s. He does have leg swelling b/l LE and worse for baseline. Has cough with clear/ white phlegm. No report of chest pain, fever, vomiting, abdominal pain, focal weakness.   ED Course: Vitals stable was placed on oxygen and on room air desaturated at 88% per ED physician, blood work showed elevated BUN/creatinine at 4.5, EKG showed sinus rhythm RV no acute ischemic changes.  Chest x-ray showed mild patchy opacity of the right lung base developing pneumonia is not excluded, central pulmonary  vascular congestion without pulmonary edema noted.  Patient was given Solu-Medrol 125 mg, bronchodilators nebulizer.  D-dimer was elevated 1.7, ED doctor felt low suspicion of PE, and had asymmetric swelling of the leg and duplex of the leg has been ordered. Given his acute on chronic dyspnea, new oxygen requirement admission was requested for further management and pulmonary evaluation Underwent CT chest showing pulmonary edema/venous congestion. Seen by nephrology and was diuresed with IV Lasix high-dose with improvement in the fluid overload. He had episode of hyperkalemia and losartan has been discontinued got to treat with Lokelma and bicarb with improvement in the potassium. At this time patient will be discharged in stable not needing oxygen anymore not short of breath leg edema significantly better with minimal trace edema still present. Discussed w/ nephrology changed to p.o. Lasix and will be discharged home on oral Lasix with instruction to follow-up with his physician in [place].  Discharge Diagnoses:  Principal Problem:   Acute respiratory failure ([place]) Active Problems:   Hypercholesterolemia   Hypertension   Renal failure   Type 2 diabetes mellitus ([place])   Physical deconditioning   Cerebral infarction ([place])  Acute hypoxic respiratory failure/dyspnea x3 months with acute worsening/history of recent treatment with multiple antibiotics also prednisone: 40-50-pack-year history of smoking/ANCA positive vasculitis no formal PFT done yet.  CT shows bilateral small pleural effusion with compressive basilar atelectasis septal thickening suggestive of pulmonary edema, and also bilateral ankle edema on exam and in the setting of CKD suspecting symptoms contributed by fluid overload  from CKD.  No DVT on duplex.  Echocardiogram with normal EF grade 1 diastolic dysfunction.  Symptom improved with diuretics, switch to p.o. Lasix and will be discharged with instruction to follow-up with PCP.   Monitor fluid status and symptoms at home.  Can continue with bronchodilators at home.    Hyperkalemia with CKD and hyperglycemia:resolved with lokelma and bicarb. Monitor. Losartan on hold and stopped on d/c.   Hypercholesterolemia: Continue home statin.  Hypertension:  BP is well controlled multiple home meds amlodipine, Coreg, hydralazine  and stopped his ARB.  Mild AKI on CKD stage V not on HD:  he needs to fu with his pcp/neprho and being discharged to home as per nephro on lasix. He had recent superficialization of AV fistula by Dr. Carlis Abbott.   Consults: Nephrology, pulmonary  Subjective: Normal shortness of breath chest pain and not wheezing today. Tolerating oral Lasix.  Came off oxygen.. Wants to go home today. Discharge Exam: Vitals:   05/20/20 1033 05/20/20 1150  BP:    Pulse: 65   Resp:    Temp:    SpO2: 94% 96%   General: Pt is alert, awake, not in acute distress Cardiovascular: RRR, S1/S2 +, no rubs, no gallops Respiratory: CTA bilaterally, no wheezing, no rhonchi Abdominal: Soft, NT, ND, bowel sounds + Extremities: no edema, no cyanosis  Discharge Instructions  Discharge Instructions     Diet - low sodium heart healthy   Complete by: As directed    Discharge instructions   Complete by: As directed    Please call call MD or return to ER for similar or worsening recurring problem that brought you to hospital or if any fever,nausea/vomiting,abdominal pain, uncontrolled pain, chest pain,  shortness of breath or any other alarming symptoms.  Please follow-up your doctor as instructed in a week time and call the office for appointment.  Please avoid alcohol, smoking, or any other illicit substance and maintain healthy habits including taking your regular medications as prescribed.  You were cared for by a hospitalist during your hospital stay. If you have any questions about your discharge medications or the care you received while you were in the hospital after you  are discharged, you can call the unit and ask to speak with the hospitalist on call if the hospitalist that took care of you is not available.  Once you are discharged, your primary care physician will handle any further medical issues. Please note that NO REFILLS for any discharge medications will be authorized once you are discharged, as it is imperative that you return to your primary care physician (or establish a relationship with a primary care physician if you do not have one) for your aftercare needs so that they can reassess your need for medications and monitor your lab values   Discharge wound care:   Complete by: As directed    As instructed postop.   Increase activity slowly   Complete by: As directed       Allergies as of 05/20/2020       Reactions   Clonidine Derivatives Other (See Comments)   Not known        Medication List     STOP taking these medications    losartan 50 MG tablet Commonly known as: COZAAR       TAKE these medications    albuterol (2.5 MG/3ML) 0.083% nebulizer solution Commonly known as: PROVENTIL Take 3 mLs (2.5 mg total) by nebulization every 6 (six) hours as needed for wheezing  or shortness of breath.   albuterol 108 (90 Base) MCG/ACT inhaler Commonly known as: VENTOLIN HFA Inhale 2 puffs into the lungs every 6 (six) hours as needed (wheezing/cough).   allopurinol 100 MG tablet Commonly known as: ZYLOPRIM Take 100 mg by mouth daily.   amLODipine 10 MG tablet Commonly known as: NORVASC Take 10 mg by mouth daily.   aspirin EC 81 MG tablet Take 81 mg by mouth daily.   benzonatate 100 MG capsule Commonly known as: TESSALON Take 200 mg by mouth every 8 (eight) hours as needed for cough.   carvedilol 12.5 MG tablet Commonly known as: COREG Take 1 tablet (12.5 mg total) by mouth 2 (two) times daily with a meal. What changed: additional instructions   cetirizine 10 MG tablet Commonly known as: ZYRTEC Take 10 mg by mouth daily  as needed (cough).   colchicine 0.6 MG tablet Take 0.6 mg by mouth daily as needed (gout).   dutasteride 0.5 MG capsule Commonly known as: AVODART Take 0.5 mg by mouth daily.   EYE DROPS ADVANCED RELIEF OP Place 1 drop into both eyes daily as needed (dry eyes).   fluticasone 50 MCG/ACT nasal spray Commonly known as: FLONASE Place 2 sprays into both nostrils daily. What changed: when to take this   furosemide 80 MG tablet Commonly known as: LASIX Take 1 tablet (80 mg total) by mouth 2 (two) times daily. What changed:  medication strength how much to take when to take this   guaiFENesin 600 MG 12 hr tablet Commonly known as: MUCINEX Take 1 tablet (600 mg total) by mouth 2 (two) times daily for 7 days.   hydrALAZINE 25 MG tablet Commonly known as: APRESOLINE Take 25 mg by mouth 2 (two) times daily.   insulin aspart 100 UNIT/ML injection Commonly known as: novoLOG Inject 4-10 Units into the skin 3 (three) times daily as needed for high blood sugar.   Levemir FlexTouch 100 UNIT/ML FlexPen Generic drug: insulin detemir Inject 30-45 Units into the skin at bedtime as needed (high blood sugar). Per sliding scale   mirtazapine 7.5 MG tablet Commonly known as: REMERON Take 7.5 mg by mouth every evening.   multivitamin with minerals Tabs tablet Take 1 tablet by mouth daily.   Norel AD 4-10-325 MG Tabs Generic drug: Chlorphen-PE-Acetaminophen Take 1 tablet by mouth 2 (two) times daily as needed (pain/cough).   oxyCODONE-acetaminophen 5-325 MG tablet Commonly known as: Percocet Take 1 tablet by mouth every 6 (six) hours as needed for severe pain.   rosuvastatin 10 MG tablet Commonly known as: CRESTOR Take 1 tablet (10 mg total) by mouth at bedtime.   tamsulosin 0.4 MG Caps capsule Commonly known as: FLOMAX Take 1 capsule (0.4 mg total) by mouth at bedtime. What changed: how much to take               Discharge Care Instructions  (From admission, onward)            Start     Ordered   05/20/20 0000  Discharge wound care:       Comments: As instructed postop.   05/20/20 1128             Allergies  Allergen Reactions   Clonidine Derivatives Other (See Comments)    Not known    The results of significant diagnostics from this hospitalization (including imaging, microbiology, ancillary and laboratory) are listed below for reference.    Microbiology: Recent Results (from the past 240 hour(s))  SARS Coronavirus 2 by RT PCR (hospital order, performed in [facility] hospital lab) Nasopharyngeal Nasopharyngeal Swab     Status: None   Collection Time: 05/16/20  3:48 PM   Specimen: Nasopharyngeal Swab  Result Value Ref Range Status   SARS Coronavirus 2 NEGATIVE NEGATIVE Final    Comment: (NOTE) SARS-CoV-2 target nucleic acids are NOT DETECTED.  The SARS-CoV-2 RNA is generally detectable in upper and lower respiratory specimens during the acute phase of infection. The lowest concentration of SARS-CoV-2 viral copies this assay can detect is 250 copies / mL. A negative result does not preclude SARS-CoV-2 infection and should not be used as the sole basis for treatment or other patient management decisions.  A negative result may occur with improper specimen collection / handling, submission of specimen other than nasopharyngeal swab, presence of viral mutation(s) within the areas targeted by this assay, and inadequate number of viral copies (<250 copies / mL). A negative result must be combined with clinical observations, patient history, and epidemiological information.  Fact Sheet for Patients:   StrictlyIdeas.no  Fact Sheet for Healthcare Providers: BankingDealers.co.za  This test is not yet approved or  cleared by the [place] FDA and has been authorized for detection and/or diagnosis of SARS-CoV-2 by FDA under an Emergency Use Authorization (EUA).  This EUA will remain in  effect (meaning this test can be used) for the duration of the COVID-19 declaration under Section 564(b)(1) of the Act, 21 U.S.C. section 360bbb-3(b)(1), unless the authorization is terminated or revoked sooner.  Performed at [facility], [place] [address] [place], [place] [address]   MRSA PCR Screening     Status: None   Collection Time: 05/20/20  9:49 AM   Specimen: Nasopharyngeal  Result Value Ref Range Status   MRSA by PCR NEGATIVE NEGATIVE Final    Comment:        The GeneXpert MRSA Assay (FDA approved for NASAL specimens only), is one component of a comprehensive MRSA colonization surveillance program. It is not intended to diagnose MRSA infection nor to guide or monitor treatment for MRSA infections. Performed at [facility], [place] [address] [place], [place] [address]     Procedures/Studies: DG Chest 2 View  Result Date: 05/16/2020 CLINICAL DATA:  Shortness of breath EXAM: CHEST - 2 VIEW COMPARISON:  April 20, 2020 FINDINGS: The mediastinal contour and cardiac silhouette are stable. The heart size is mildly enlarged. Mild patchy opacity of right lung base is identified. Cephalization of flow is identified bilaterally. No frank pulmonary edema or pleural effusion is identified. The bony structures are stable. IMPRESSION: Mild patchy opacity of right lung base, developing pneumonia is not excluded. Central pulmonary vascular congestion without pulmonary edema. Electronically Signed   By: Abelardo Diesel M.D.   On: 05/16/2020 14:48   DG Chest 2 View  Result Date: 04/20/2020 CLINICAL DATA:  Shortness of breath with cough and wheezing EXAM: CHEST - 2 VIEW COMPARISON:  March 11, 2020 FINDINGS: A small portion of the lateral left base is not evident. No edema or consolidation evident in the visualized lungs. Heart size and pulmonary vascularity are normal. No adenopathy. There is aortic atherosclerosis. There is degenerative change in the thoracic  spine. IMPRESSION: A small portion of the lateral left base is not imaged. Visualized lungs clear. Cardiac silhouette within normal limits. Aortic Atherosclerosis (ICD10-I70.0). Electronically Signed   By: Lowella Grip III M.D.   On: 04/20/2020 19:47   CT CHEST WO CONTRAST  Result  Date: 05/16/2020 CLINICAL DATA:  Cardiac arrest. EXAM: CT CHEST WITHOUT CONTRAST TECHNIQUE: Multidetector CT imaging of the chest was performed following the standard protocol without IV contrast. COMPARISON:  Radiograph earlier today. FINDINGS: Cardiovascular: Aortic atherosclerosis. Branch vessel tortuosity. Upper normal heart size with coronary artery calcifications. There is no pericardial effusion. Mediastinum/Nodes: Shotty mediastinal lymph nodes measuring up to 11 mm in the right lower paratracheal station. Limited assessment for hilar adenopathy on noncontrast exam. No axillary adenopathy. Patulous esophagus with intraluminal density distally, consistent with ingested material. No visualized thyroid nodule. Lungs/Pleura: Mild emphysema. Small bilateral pleural effusions with compressive basilar atelectasis. Mild smooth septal thickening suggesting pulmonary edema. No evidence of pulmonary mass. No pneumothorax. Upper Abdomen: No acute finding. Moderate stool burden in the included colon. Diverticulosis at the splenic flexure. Punctate hepatic granuloma. Musculoskeletal: No sternal or anterior rib fracture. No acute osseous abnormalities. There is mild degenerative change in the spine. IMPRESSION: 1. Small bilateral pleural effusions with compressive basilar atelectasis. Mild smooth septal thickening suggesting pulmonary edema. 2. Shotty mediastinal lymph nodes are likely reactive. 3. Coronary artery calcifications and aortic atherosclerosis. Aortic Atherosclerosis (ICD10-I70.0) and Emphysema (ICD10-J43.9). Electronically Signed   By: Keith Rake M.D.   On: 05/16/2020 21:12   DG Chest Port 1 View  Result Date:  05/19/2020 CLINICAL DATA:  Shortness of breath. EXAM: PORTABLE CHEST 1 VIEW COMPARISON:  May 16, 2020. FINDINGS: Stable cardiomediastinal silhouette. No pneumothorax is noted. Mild bibasilar subsegmental atelectasis or infiltrates are noted. No significant pleural effusions are noted. Bony thorax is unremarkable. IMPRESSION: Mild bibasilar subsegmental atelectasis or infiltrates are noted. Electronically Signed   By: Marijo Conception M.D.   On: 05/19/2020 09:50   VAS US DUPLEX DIALYSIS ACCESS (AVF, AVG)  Result Date: 04/23/2020 DIALYSIS ACCESS Reason for Exam: Routine follow up. Access Site: Right Upper Extremity. Access Type: Brachial-cephalic AVF. History: Placed 03/10/2020. Performing Technologist: Caralee Ates BA, RVT, RDMS  Examination Guidelines: A complete evaluation includes B-mode imaging, spectral Doppler, color Doppler, and power Doppler as needed of all accessible portions of each vessel. Unilateral testing is considered an integral part of a complete examination. Limited examinations for reoccurring indications may be performed as noted.  Findings: +--------------------+----------+-----------------+--------+ AVF                 PSV (cm/s)Flow Vol (mL/min)Comments +--------------------+----------+-----------------+--------+ Native artery inflow   357          1742                +--------------------+----------+-----------------+--------+ AVF Anastomosis        777                              +--------------------+----------+-----------------+--------+  +------------+----------+-------------+----------+----------------+ OUTFLOW VEINPSV (cm/s)Diameter (cm)Depth (cm)    Describe     +------------+----------+-------------+----------+----------------+ Shoulder       248        0.51        0.78   competing branch +------------+----------+-------------+----------+----------------+ Prox UA        191        0.49        0.58   competing branch  +------------+----------+-------------+----------+----------------+ Mid UA         606        0.57        0.46   competing branch +------------+----------+-------------+----------+----------------+ Dist UA        208        0.48  0.40   competing branch +------------+----------+-------------+----------+----------------+ AC Fossa       393        0.75        0.35   competing branch +------------+----------+-------------+----------+----------------+  Summary: Patent arteriovenous fistula. *See table(s) above for measurements and observations.  Diagnosing physician: Ruta Hinds MD Electronically signed by Ruta Hinds MD on 04/23/2020 at 12:10:46 PM.   --------------------------------------------------------------------------------   Final    ECHOCARDIOGRAM COMPLETE  Result Date: 05/17/2020    ECHOCARDIOGRAM REPORT   Patient Name:   JORGEN WOLFINGER Date of Exam: 05/17/2020 Medical Rec #:  [id number]        Height:       69.0 in Accession #:    [id number]       Weight:       269.4 lb Date of Birth:  09/13/1932        BSA:          2.345 m Patient Age:    86 years         BP:           140/76 mmHg Patient Gender: M                HR:           63 bpm. Exam Location:  Inpatient Procedure: 2D Echo, Cardiac Doppler, Color Doppler and Intracardiac            Opacification Agent Indications:    R06.02 SOB  History:        Patient has prior history of Echocardiogram examinations, most                 recent 04/23/2013. Stroke, Signs/Symptoms:Chest Pain; Risk                 Factors:Diabetes, Hypertension and Former Smoker. Chemotherapy.                 Respiratory and renal failure.  Sonographer:    Roseanna Rainbow RDCS Referring Phys: [id number] [facility]  Sonographer Comments: Technically difficult study due to poor echo windows and patient is morbidly obese. Image acquisition challenging due to patient body habitus and Image acquisition challenging due to respiratory motion. IMPRESSIONS  1. Definity used;  normal LV systolic function; grade 1 diastolic dysfunction.  2. Left ventricular ejection fraction, by estimation, is 60 to 65%. The left ventricle has normal function. The left ventricle has no regional wall motion abnormalities. Left ventricular diastolic parameters are consistent with Grade I diastolic dysfunction (impaired relaxation).  3. Right ventricular systolic function is normal. The right ventricular size is normal.  4. The mitral valve is normal in structure. Trivial mitral valve regurgitation. No evidence of mitral stenosis.  5. The aortic valve is tricuspid. Aortic valve regurgitation is not visualized. Mild aortic valve sclerosis is present, with no evidence of aortic valve stenosis.  6. The inferior vena cava is dilated in size with <50% respiratory variability, suggesting right atrial pressure of 15 mmHg. FINDINGS  Left Ventricle: Left ventricular ejection fraction, by estimation, is 60 to 65%. The left ventricle has normal function. The left ventricle has no regional wall motion abnormalities. Definity contrast agent was given IV to delineate the left ventricular  endocardial borders. The left ventricular internal cavity size was normal in size. There is no left ventricular hypertrophy. Left ventricular diastolic parameters are consistent with Grade I diastolic dysfunction (impaired relaxation). Right Ventricle: The right ventricular size is normal. Right ventricular systolic function is normal.  Left Atrium: Left atrial size was normal in size. Right Atrium: Right atrial size was normal in size. Pericardium: There is no evidence of pericardial effusion. Mitral Valve: The mitral valve is normal in structure. Normal mobility of the mitral valve leaflets. Trivial mitral valve regurgitation. No evidence of mitral valve stenosis. Tricuspid Valve: The tricuspid valve is normal in structure. Tricuspid valve regurgitation is trivial. No evidence of tricuspid stenosis. Aortic Valve: The aortic valve is  tricuspid. Aortic valve regurgitation is not visualized. Mild aortic valve sclerosis is present, with no evidence of aortic valve stenosis. Pulmonic Valve: The pulmonic valve was not well visualized. Pulmonic valve regurgitation is not visualized. No evidence of pulmonic stenosis. Aorta: The aortic root is normal in size and structure. Venous: The inferior vena cava is dilated in size with less than 50% respiratory variability, suggesting right atrial pressure of 15 mmHg. IAS/Shunts: No atrial level shunt detected by color flow Doppler. Additional Comments: Definity used; normal LV systolic function; grade 1 diastolic dysfunction.  LEFT VENTRICLE PLAX 2D LVIDd:         4.75 cm      Diastology LVIDs:         3.36 cm      LV e' lateral:   8.59 cm/s LV PW:         1.30 cm      LV E/e' lateral: 10.8 LV IVS:        1.01 cm      LV e' medial:    6.74 cm/s LVOT diam:     2.10 cm      LV E/e' medial:  13.8 LV SV:         94 LV SV Index:   40 LVOT Area:     3.46 cm  LV Volumes (MOD) LV vol d, MOD A2C: 108.0 ml LV vol d, MOD A4C: 104.7 ml LV vol s, MOD A2C: 34.3 ml LV vol s, MOD A4C: 38.4 ml LV SV MOD A2C:     73.7 ml LV SV MOD A4C:     104.7 ml LV SV MOD BP:      71.2 ml RIGHT VENTRICLE             IVC RV S prime:     10.60 cm/s  IVC diam: 2.18 cm TAPSE (M-mode): 2.0 cm LEFT ATRIUM             Index       RIGHT ATRIUM           Index LA diam:        4.20 cm 1.79 cm/m  RA Area:     19.80 cm LA Vol (A2C):   54.7 ml 23.33 ml/m RA Volume:   59.10 ml  25.20 ml/m LA Vol (A4C):   49.4 ml 21.07 ml/m LA Biplane Vol: 53.3 ml 22.73 ml/m  AORTIC VALVE LVOT Vmax:   119.00 cm/s LVOT Vmean:  89.000 cm/s LVOT VTI:    0.271 m  AORTA Ao Root diam: 3.70 cm MITRAL VALVE MV Area (PHT): 2.91 cm    SHUNTS MV Decel Time: 261 msec    Systemic VTI:  0.27 m MV E velocity: 93.10 cm/s  Systemic Diam: 2.10 cm MV A velocity: 79.10 cm/s MV E/A ratio:  1.18 Kirk Ruths MD Electronically signed by Kirk Ruths MD Signature Date/Time:  05/17/2020/11:28:04 AM    Final    VAS [place] LOWER EXTREMITY VENOUS (DVT)  Result Date: 05/17/2020  Lower Venous DVTStudy Indications: Edema.  Limitations: Edema. Comparison Study: No prior study on file Performing Technologist: Sharion Dove RVS  Examination Guidelines: A complete evaluation includes B-mode imaging, spectral Doppler, color Doppler, and power Doppler as needed of all accessible portions of each vessel. Bilateral testing is considered an integral part of a complete examination. Limited examinations for reoccurring indications may be performed as noted. The reflux portion of the exam is performed with the patient in reverse Trendelenburg.  +---------+---------------+---------+-----------+----------+--------------+ RIGHT    CompressibilityPhasicitySpontaneityPropertiesThrombus Aging +---------+---------------+---------+-----------+----------+--------------+ CFV      Full           Yes      Yes                                 +---------+---------------+---------+-----------+----------+--------------+ SFJ      Full                                                        +---------+---------------+---------+-----------+----------+--------------+ FV Prox  Full                                                        +---------+---------------+---------+-----------+----------+--------------+ FV Mid   Full                                                        +---------+---------------+---------+-----------+----------+--------------+ FV DistalFull                                                        +---------+---------------+---------+-----------+----------+--------------+ PFV      Full                                                        +---------+---------------+---------+-----------+----------+--------------+ POP      Full           Yes      Yes                                  +---------+---------------+---------+-----------+----------+--------------+ PTV      Full                                                        +---------+---------------+---------+-----------+----------+--------------+ PERO  Not visualized +---------+---------------+---------+-----------+----------+--------------+   +----+---------------+---------+-----------+----------+--------------+ LEFTCompressibilityPhasicitySpontaneityPropertiesThrombus Aging +----+---------------+---------+-----------+----------+--------------+ CFV Full           Yes      Yes                                 +----+---------------+---------+-----------+----------+--------------+     Summary: RIGHT: - There is no evidence of deep vein thrombosis in the lower extremity. However, portions of this examination were limited- see technologist comments above.  Interstitial fluid noted throughout calf  LEFT: - No evidence of common femoral vein obstruction. - Ultrasound characteristics of enlarged lymph nodes noted in the groin.  *See table(s) above for measurements and observations. Electronically signed by Monica Martinez MD on 05/17/2020 at 1:13:50 PM.    Final     Labs: BNP (last 3 results) Recent Labs    05/16/20 1654 05/17/20 0412  BNP 292.6* 220.2*   Basic Metabolic Panel: Recent Labs  Lab 05/17/20 0412 05/18/20 0450 05/18/20 1351 05/19/20 0355 05/20/20 0322  NA 135 135 139 140 138  K 5.3* 6.1* 4.1 4.0 3.8  CL 100 101 99 100 98  CO2 25 21* 29 28 28   GLUCOSE 339* 243* 208* 84 170*  BUN 51* QUANTITY NOT SUFFICIENT, UNABLE TO PERFORM TEST 69* 66* 70*  CREATININE 5.01* 5.31* 5.29* 5.50* 5.25*  CALCIUM 8.3* 8.8* 8.6* 8.3* 8.1*  PHOS  --   --  4.5  --  5.5*   Liver Function Tests: Recent Labs  Lab 05/17/20 0412 05/18/20 0450 05/18/20 1351 05/19/20 0355 05/20/20 0322  AST 14* 24  --  21  --   ALT 9 11  --  12  --   ALKPHOS 49 45  --  43   --   BILITOT 0.7 0.5  --  0.3  --   PROT 6.8 6.8  --  6.2*  --   ALBUMIN 3.2* 3.4* 3.5 3.1* 3.3*   No results for input(s): LIPASE, AMYLASE in the last 168 hours. No results for input(s): AMMONIA in the last 168 hours. CBC: Recent Labs  Lab 05/16/20 1548 05/17/20 0412 05/18/20 0450 05/19/20 0355  WBC 7.8 4.4 8.5 9.0  NEUTROABS 5.4  --   --   --   HGB 9.0* 9.1* 8.9* 9.0*  HCT 29.0* 29.3* 28.4* 28.7*  MCV 86.8 87.7 85.8 88.0  PLT 184 213 209 232   Cardiac Enzymes: No results for input(s): CKTOTAL, CKMB, CKMBINDEX, TROPONINI in the last 168 hours. BNP: Invalid input(s): POCBNP CBG: Recent Labs  Lab 05/19/20 1148 05/19/20 1644 05/19/20 2142 05/20/20 0829 05/20/20 1144  GLUCAP 140* 89 204* 123* 114*   D-Dimer No results for input(s): DDIMER in the last 72 hours. Hgb A1c No results for input(s): HGBA1C in the last 72 hours. Lipid Profile No results for input(s): CHOL, HDL, LDLCALC, TRIG, CHOLHDL, LDLDIRECT in the last 72 hours. Thyroid function studies No results for input(s): TSH, T4TOTAL, T3FREE, THYROIDAB in the last 72 hours.  Invalid input(s): FREET3 Anemia work up Recent Labs    05/20/20 0322  FERRITIN 149  TIBC 204*  IRON 43*   Urinalysis    Component Value Date/Time   COLORURINE GREEN (A) 07/24/2019 1329   APPEARANCEUR CLEAR 07/24/2019 1329   LABSPEC 1.011 07/24/2019 1329   PHURINE 6.0 07/24/2019 1329   GLUCOSEU NEGATIVE 07/24/2019 1329   HGBUR NEGATIVE 07/24/2019 1329   BILIRUBINUR NEGATIVE 07/24/2019 1329   [place] 07/24/2019 1329  PROTEINUR >=300 (A) 07/24/2019 1329   UROBILINOGEN 0.2 04/22/2013 2301   NITRITE POSITIVE (A) 07/24/2019 1329   LEUKOCYTESUR SMALL (A) 07/24/2019 1329   Sepsis Labs Invalid input(s): PROCALCITONIN,  WBC,  LACTICIDVEN Microbiology Recent Results (from the past 240 hour(s))  SARS Coronavirus 2 by RT PCR (hospital order, performed in [facility] hospital lab) Nasopharyngeal Nasopharyngeal Swab      Status: None   Collection Time: 05/16/20  3:48 PM   Specimen: Nasopharyngeal Swab  Result Value Ref Range Status   SARS Coronavirus 2 NEGATIVE NEGATIVE Final    Comment: (NOTE) SARS-CoV-2 target nucleic acids are NOT DETECTED.  The SARS-CoV-2 RNA is generally detectable in upper and lower respiratory specimens during the acute phase of infection. The lowest concentration of SARS-CoV-2 viral copies this assay can detect is 250 copies / mL. A negative result does not preclude SARS-CoV-2 infection and should not be used as the sole basis for treatment or other patient management decisions.  A negative result may occur with improper specimen collection / handling, submission of specimen other than nasopharyngeal swab, presence of viral mutation(s) within the areas targeted by this assay, and inadequate number of viral copies (<250 copies / mL). A negative result must be combined with clinical observations, patient history, and epidemiological information.  Fact Sheet for Patients:   StrictlyIdeas.no  Fact Sheet for Healthcare Providers: BankingDealers.co.za  This test is not yet approved or  cleared by the [place] FDA and has been authorized for detection and/or diagnosis of SARS-CoV-2 by FDA under an Emergency Use Authorization (EUA).  This EUA will remain in effect (meaning this test can be used) for the duration of the COVID-19 declaration under Section 564(b)(1) of the Act, 21 U.S.C. section 360bbb-3(b)(1), unless the authorization is terminated or revoked sooner.  Performed at [facility], [place] [address], [place], [place] [address]   MRSA PCR Screening     Status: None   Collection Time: 05/20/20  9:49 AM   Specimen: Nasopharyngeal  Result Value Ref Range Status   MRSA by PCR NEGATIVE NEGATIVE Final    Comment:        The GeneXpert MRSA Assay (FDA approved for NASAL specimens only), is one component  of a comprehensive MRSA colonization surveillance program. It is not intended to diagnose MRSA infection nor to guide or monitor treatment for MRSA infections. Performed at [facility], [place] [address] [place], [place] [address]      Time coordinating discharge: 35 minutes  SIGNED: Antonieta Pert, MD  [facility] 05/20/2020, 3:55 PM  If 7PM-7AM, please contact night-coverage [email or web address]

## 2020-06-09 ENCOUNTER — Other Ambulatory Visit: Payer: Self-pay

## 2020-06-09 ENCOUNTER — Ambulatory Visit: Payer: Medicare Other | Admitting: Vascular Surgery

## 2020-07-08 NOTE — Progress Notes (Unsigned)
Patient Not seen

## 2021-08-30 ENCOUNTER — Emergency Department (HOSPITAL_COMMUNITY)
Admission: EM | Admit: 2021-08-30 | Discharge: 2021-08-31 | Disposition: A | Discharge status: Home / Self Care | Payer: Medicare Other | Attending: Emergency Medicine | Admitting: Emergency Medicine

## 2021-08-30 ENCOUNTER — Other Ambulatory Visit: Payer: Self-pay

## 2021-08-30 DIAGNOSIS — Z87891 Personal history of nicotine dependence: Secondary | ICD-10-CM | POA: Insufficient documentation

## 2021-08-30 DIAGNOSIS — N186 End stage renal disease: Secondary | ICD-10-CM | POA: Insufficient documentation

## 2021-08-30 DIAGNOSIS — Z992 Dependence on renal dialysis: Secondary | ICD-10-CM | POA: Insufficient documentation

## 2021-08-30 DIAGNOSIS — Z7982 Long term (current) use of aspirin: Secondary | ICD-10-CM | POA: Insufficient documentation

## 2021-08-30 DIAGNOSIS — I120 Hypertensive chronic kidney disease with stage 5 chronic kidney disease or end stage renal disease: Secondary | ICD-10-CM | POA: Diagnosis not present

## 2021-08-30 DIAGNOSIS — E1122 Type 2 diabetes mellitus with diabetic chronic kidney disease: Secondary | ICD-10-CM | POA: Insufficient documentation

## 2021-08-30 DIAGNOSIS — Z79899 Other long term (current) drug therapy: Secondary | ICD-10-CM | POA: Diagnosis not present

## 2021-08-30 LAB — CBC WITH DIFFERENTIAL/PLATELET
Abs Immature Granulocytes: 0.01 10*3/uL (ref 0.00–0.07)
Basophils Absolute: 0 10*3/uL (ref 0.0–0.1)
Basophils Relative: 1 %
Eosinophils Absolute: 0.4 10*3/uL (ref 0.0–0.5)
Eosinophils Relative: 8 %
HCT: 38.1 % — ABNORMAL LOW (ref 39.0–52.0)
Hemoglobin: 12 g/dL — ABNORMAL LOW (ref 13.0–17.0)
Immature Granulocytes: 0 %
Lymphocytes Relative: 26 %
Lymphs Abs: 1.1 10*3/uL (ref 0.7–4.0)
MCH: 27.1 pg (ref 26.0–34.0)
MCHC: 31.5 g/dL (ref 30.0–36.0)
MCV: 86.2 fL (ref 80.0–100.0)
Monocytes Absolute: 0.4 10*3/uL (ref 0.1–1.0)
Monocytes Relative: 10 %
Neutro Abs: 2.3 10*3/uL (ref 1.7–7.7)
Neutrophils Relative %: 55 %
Platelets: 103 10*3/uL — ABNORMAL LOW (ref 150–400)
RBC: 4.42 MIL/uL (ref 4.22–5.81)
RDW: 15 % (ref 11.5–15.5)
WBC: 4.2 10*3/uL (ref 4.0–10.5)
nRBC: 0 % (ref 0.0–0.2)

## 2021-08-30 LAB — BASIC METABOLIC PANEL
Anion gap: 9 (ref 5–15)
BUN: 44 mg/dL — ABNORMAL HIGH (ref 8–23)
CO2: 27 mmol/L (ref 22–32)
Calcium: 8.5 mg/dL — ABNORMAL LOW (ref 8.9–10.3)
Chloride: 102 mmol/L (ref 98–111)
Creatinine, Ser: 5.31 mg/dL — ABNORMAL HIGH (ref 0.61–1.24)
GFR, Estimated: 10 mL/min — ABNORMAL LOW (ref >60)
Glucose, Bld: 269 mg/dL — ABNORMAL HIGH (ref 70–99)
Potassium: 4 mmol/L (ref 3.5–5.1)
Sodium: 138 mmol/L (ref 135–145)

## 2021-08-30 NOTE — ED Provider Notes (Signed)
Emergency Medicine Provider Triage Evaluation Note  Ryan Jimenez , a 87 y.o. male  was evaluated in triage.  Pt complains of needing dialysis, patient is a regular dialysis patient, who was last seen for dialysis, completed full session on Friday, patient had to be here for travel, just had someone drive him down from [place], presents for dialysis today.  Patient is endorse a little bit of fatigue that he needs to his recent drive, no chest pain, no shortness of breath, no leg swelling, no confusion.  Review of Systems  Positive: As above Negative: As above  Physical Exam  BP (!) 158/65 (BP Location: Left Arm)   Pulse 70   Temp 98.7 F (37.1 C) (Oral)   Resp 18   SpO2 93%  Gen:   Awake, no distress   Resp:  Normal effort  MSK:   Moves extremities without difficulty  Other:  Palpable thrill in RUE fistula  Medical Decision Making  Medically screening exam initiated at 11:10 AM.  Appropriate orders placed.  Ryan Jimenez was informed that the remainder of the evaluation will be completed by another provider, this initial triage assessment does not replace that evaluation, and the importance of remaining in the ED until their evaluation is complete.  dialysis   Ryan Jimenez 08/30/21 1111    Daleen Bo, MD 08/30/21 [phone]

## 2021-08-30 NOTE — ED Triage Notes (Signed)
Pt. Stated, Im here for dialysis. Last was Friday , Im due today.

## 2021-08-31 ENCOUNTER — Encounter (HOSPITAL_COMMUNITY): Payer: Self-pay | Admitting: Internal Medicine

## 2021-08-31 NOTE — Discharge Instructions (Addendum)
Our nephrologist will call the dialysis center and try to coordinate for you to have your dialysis later on today.

## 2021-08-31 NOTE — ED Notes (Signed)
Pt not in room, unable to obtain vitals

## 2021-08-31 NOTE — ED Provider Notes (Signed)
[facility] EMERGENCY DEPARTMENT Provider Note  CSN: [id number] Arrival date & time: 08/30/21 1015  Chief Complaint(s) Fatigue and Vascular Access Problem  HPI Ryan Jimenez is a 87 y.o. male with a past medical history listed below including ESRD on dialysis MWF who presents to the emergency department to try to get dialysis.  He reports that he traveled down from [place] on Saturday.  Reports that he last had dialysis on Friday.  He was able to establish with [facility] but is not scheduled for his first dialysis session until Wednesday.  He reports being fatigued which she attributes to his recent travel.  Denies any shortness of breath.  He does have chronic peripheral edema.  No associated chest pain.  HPI  Past Medical History 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   Diabetes mellitus    Dyspnea    Dysrhythmia    Glomerulonephritis    Gout    History of pneumonia    History of right bundle branch block (RBBB)    Hypercholesterolemia    Hypertension    Near syncope 10/2011; 11/2011   Pancytopenia    Pneumonia    Renal disorder    Shortness of breath on exertion    Type 2 diabetes mellitus [facility])    Patient Active Problem List   Diagnosis Date Noted   Acute respiratory failure ([place]) 05/16/2020   CKD (chronic kidney disease) stage 3, GFR 30-59 ml/min ([place]) 04/04/2015   Chest pain 04/03/2015   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 Authorizing Provider  albuterol (PROVENTIL) (2.5 MG/3ML) 0.083% nebulizer solution Take 3 mLs (2.5 mg total) by nebulization every 6 (six) hours as needed for wheezing or shortness of breath. 05/20/20 06/19/20  Antonieta Pert, MD  albuterol (VENTOLIN HFA) 108 (90 Base) MCG/ACT inhaler Inhale 2 puffs into the lungs every 6 (six) hours as needed (wheezing/cough). 05/20/20   Antonieta Pert, MD  allopurinol (ZYLOPRIM) 100 MG tablet Take 100 mg by mouth daily.    [provider]  amLODipine (NORVASC) 10 MG tablet Take 10 mg by mouth daily.    [provider]  aspirin EC 81 MG tablet Take 81 mg by mouth daily.    [provider]  benzonatate (TESSALON) 100 MG capsule Take 200 mg by mouth every 8 (eight) hours as needed for cough.  04/15/20   [provider]  carvedilol (COREG) 12.5 MG tablet Take 1 tablet (12.5 mg total) by mouth 2 (two) times daily with a meal. Patient taking differently: Take 12.5 mg by mouth 2 (two) times daily with a meal. 10am and 10pm 05/02/13   Angiulli, Lavon Paganini, PA-C  cetirizine (ZYRTEC) 10 MG tablet Take 10 mg by mouth daily as needed (cough).    [provider]  colchicine 0.6 MG tablet Take 0.6 mg by mouth  daily as needed (gout).     [provider]  dutasteride (AVODART) 0.5 MG capsule Take 0.5 mg by mouth daily.    [provider]  fluticasone (FLONASE) 50 MCG/ACT nasal spray Place 2 sprays into both nostrils daily. Patient taking differently: Place 2 sprays into both nostrils at bedtime.  05/29/18   Tasia Catchings, Amy V, PA-C  furosemide (LASIX) 80 MG tablet Take 1 tablet (80 mg total) by mouth 2 (two) times daily. 05/20/20 06/19/20  Antonieta Pert, MD  hydrALAZINE (APRESOLINE) 25 MG tablet Take 25 mg by mouth 2 (two) times daily. 03/02/20   [provider]  insulin aspart (NOVOLOG) 100 UNIT/ML injection  Inject 4-10 Units into the skin 3 (three) times daily as needed for high blood sugar.     [provider]  LEVEMIR FLEXTOUCH 100 UNIT/ML Pen Inject 30-45 Units into the skin at bedtime as needed (high blood sugar). Per sliding scale 01/30/15   [provider]  mirtazapine (REMERON) 7.5 MG tablet Take 7.5 mg by mouth every evening. 04/29/20   [provider]  Multiple Vitamin (MULTIVITAMIN WITH MINERALS) TABS tablet Take 1 tablet by mouth daily.    [provider]  NOREL AD 4-10-325 MG TABS Take 1 tablet by mouth 2 (two) times daily as needed (pain/cough).  05/05/20   [provider]  oxyCODONE-acetaminophen (PERCOCET) 5-325 MG tablet Take 1 tablet by mouth every 6 (six) hours as needed for severe pain. 05/11/20   Rhyne, Hulen Shouts, PA-C  rosuvastatin (CRESTOR) 10 MG tablet Take 1 tablet (10 mg total) by mouth at bedtime. 05/02/13   Angiulli, Lavon Paganini, PA-C  tamsulosin (FLOMAX) 0.4 MG CAPS Take 1 capsule (0.4 mg total) by mouth at bedtime. Patient taking differently: Take 0.8 mg by mouth at bedtime.  05/02/13   Angiulli, Lavon Paganini, PA-C  Tetrahydroz-Dextran-PEG-Povid (EYE DROPS ADVANCED RELIEF OP) Place 1 drop into both eyes daily as needed (dry eyes).     [provider]                                                                                                                                    Past Surgical History Past Surgical History:  Procedure Laterality Date   AV FISTULA PLACEMENT Right 03/10/2020   Procedure: RIGHT BRACHIOCEPHALIC ARTERIOVENOUS (AV) FISTULA CREATION;  Surgeon: Elam Dutch, MD;  Location: [facility] OR;  Service: Vascular;  Laterality: Right;   CATARACT EXTRACTION W/ INTRAOCULAR LENS IMPLANT  ~ 2009   right   FISTULA SUPERFICIALIZATION Right 05/11/2020   Procedure: FISTULA REVISION, BRANCH LIGATION AND SUPERFICIALIZATION OF RIGHT UPPER ARM FISTULA;  Surgeon: Marty Heck, MD;  Location: [facility] OR;  Service: Vascular;   Laterality: Right;   HYDROCELE EXCISION     Family History Family History  Problem Relation Age of Onset   Hypertension Mother    Heart disease Mother    Diabetes Sister  Social History Social History   Tobacco Use   Smoking status: Former    Packs/day: 1.00    Years: 40.00    Pack years: 40.00    Types: Cigarettes   Smokeless tobacco: Never  Vaping Use   Vaping Use: Never used  Substance Use Topics   Alcohol use: Not Currently    Comment: 11/23/11 "couple ounces q month maybe"   Drug use: No   Allergies Clonidine derivatives  Review of Systems Review of Systems All other systems are reviewed and are negative for acute change except as noted in the HPI  Physical Exam Vital Signs  I have reviewed the triage vital signs BP (!) 161/54   Pulse 66   Temp 97.9 F (36.6 C)   Resp 13   SpO2 97%   Physical Exam Vitals reviewed.  Constitutional:      General: He is not in acute distress.    Appearance: He is well-developed. He is not diaphoretic.  HENT:     Head: Normocephalic and atraumatic.     Nose: Nose normal.  Eyes:     General: No scleral icterus.       Right eye: No discharge.        Left eye: No discharge.     Conjunctiva/sclera: Conjunctivae normal.     Pupils: Pupils are equal, round, and reactive to light.  Cardiovascular:     Rate and Rhythm: Normal rate and regular rhythm.     Heart sounds: No murmur heard.   No friction rub. No gallop.  Pulmonary:     Effort: Pulmonary effort is normal. No respiratory distress.     Breath sounds: Normal breath sounds. No stridor. No rales.  Abdominal:     General: There is no distension.     Palpations: Abdomen is soft.     Tenderness: There is no abdominal tenderness.  Musculoskeletal:        General: No tenderness.     Cervical back: Normal range of motion and neck supple.     Right lower leg: 1+ Pitting Edema present.     Left lower leg: 1+ Pitting Edema present.  Skin:    General: Skin is warm and  dry.     Findings: No erythema or rash.  Neurological:     Mental Status: He is alert and oriented to person, place, and time.    ED Results and Treatments Labs (all labs ordered are listed, but only abnormal results are displayed) Labs Reviewed  CBC WITH DIFFERENTIAL/PLATELET - Abnormal; Notable for the following components:      Result Value   Hemoglobin 12.0 (*)    HCT 38.1 (*)    Platelets 103 (*)    All other components within normal limits  BASIC METABOLIC PANEL - Abnormal; Notable for the following components:   Glucose, Bld 269 (*)    BUN 44 (*)    Creatinine, Ser 5.31 (*)    Calcium 8.5 (*)    GFR, Estimated 10 (*)    All other components within normal limits  EKG  EKG Interpretation  Date/Time:  Monday August 30 2021 10:55:38 EDT Ventricular Rate:  68 PR Interval:  160 QRS Duration: 126 QT Interval:  428 QTC Calculation: 455 R Axis:   83 Text Interpretation: Normal sinus rhythm Right bundle branch block Abnormal ECG Confirmed by Merrily Pew [phone]) on 08/31/2021 12:27:57 AM       Radiology No results found.  Pertinent labs & imaging results that were available during my care of the patient were reviewed by me and considered in my medical decision making (see MDM for details).  Medications Ordered in ED Medications - No data to display                                                                                                                                   Procedures Procedures  (including critical care time)  Medical Decision Making / ED Course I have reviewed the nursing notes for this encounter and the patient's prior records (if available in EHR or on provided paperwork).  Ryan Jimenez was evaluated in Emergency Department on 08/31/2021 for the symptoms described in the history of present illness. He was evaluated in  the context of the global COVID-19 pandemic, which necessitated consideration that the patient might be at risk for infection with the SARS-CoV-2 virus that causes COVID-19. Institutional protocols and algorithms that pertain to the evaluation of patients at risk for COVID-19 are in a state of rapid change based on information released by regulatory bodies including the CDC and federal and state organizations. These policies and algorithms were followed during the patient's care in the ED.     Patient presents to the ED trying to get dialysis until he is able to get into his dialysis center on Wednesday. Currently asymptomatic.  Pertinent labs & imaging results that were available during my care of the patient were reviewed by me and considered in my medical decision making:  Labs reassuring without significant electrolyte derangements.  Stable renal function. Patient is satting well on room air.  Lungs are clear to auscultation bilaterally. Does not appear to need emergent dialysis. I spoke with Dr. Johnney Ou from the nephrology department who recommended discharging the patient.  She or her partners will contact the dialysis center and arrange for the patient to get dialysis.  Patient updated on plan. Patient's contact information: [phone] or [phone]  Final Clinical Impression(s) / ED Diagnoses Final diagnoses:  ESRD (end stage renal disease) ([place])   The patient appears reasonably screened and/or stabilized for discharge and I doubt any other medical condition or other [facility] requiring further screening, evaluation, or treatment in the ED at this time prior to discharge. Safe for discharge with strict return precautions.  Disposition: Discharge  Condition: Good  I have discussed the results, Dx and Tx plan with the patient/family who expressed understanding and agree(s) with the plan. Discharge instructions discussed at length. The patient/family was given strict return  precautions  who verbalized understanding of the instructions. No further questions at time of discharge.    ED Discharge Orders     None         This chart was dictated using voice recognition software.  Despite best efforts to proofread,  errors can occur which can change the documentation meaning.    Fatima Blank, MD 08/31/21 [phone]

## 2023-06-07 ENCOUNTER — Emergency Department (HOSPITAL_COMMUNITY): Payer: Medicare Other

## 2023-06-07 ENCOUNTER — Emergency Department (HOSPITAL_COMMUNITY)
Admission: EM | Admit: 2023-06-07 | Discharge: 2023-06-07 | Disposition: A | Discharge status: Home / Self Care | Payer: Medicare Other | Attending: Emergency Medicine | Admitting: Emergency Medicine

## 2023-06-07 DIAGNOSIS — I120 Hypertensive chronic kidney disease with stage 5 chronic kidney disease or end stage renal disease: Secondary | ICD-10-CM | POA: Insufficient documentation

## 2023-06-07 DIAGNOSIS — N186 End stage renal disease: Secondary | ICD-10-CM | POA: Diagnosis not present

## 2023-06-07 DIAGNOSIS — E1122 Type 2 diabetes mellitus with diabetic chronic kidney disease: Secondary | ICD-10-CM | POA: Insufficient documentation

## 2023-06-07 DIAGNOSIS — R1084 Generalized abdominal pain: Secondary | ICD-10-CM | POA: Diagnosis not present

## 2023-06-07 DIAGNOSIS — R112 Nausea with vomiting, unspecified: Secondary | ICD-10-CM | POA: Diagnosis not present

## 2023-06-07 LAB — URINALYSIS, ROUTINE W REFLEX MICROSCOPIC
Bacteria, UA: NONE SEEN
Bilirubin Urine: NEGATIVE
Glucose, UA: NEGATIVE mg/dL
Hgb urine dipstick: NEGATIVE
Ketones, ur: NEGATIVE mg/dL
Leukocytes,Ua: NEGATIVE
Nitrite: NEGATIVE
Protein, ur: 100 mg/dL — AB
Specific Gravity, Urine: 1.014 (ref 1.005–1.030)
pH: 7 (ref 5.0–8.0)

## 2023-06-07 LAB — CBC
HCT: 37.3 % — ABNORMAL LOW (ref 39.0–52.0)
Hemoglobin: 11.9 g/dL — ABNORMAL LOW (ref 13.0–17.0)
MCH: 29 pg (ref 26.0–34.0)
MCHC: 31.9 g/dL (ref 30.0–36.0)
MCV: 91 fL (ref 80.0–100.0)
Platelets: 128 10*3/uL — ABNORMAL LOW (ref 150–400)
RBC: 4.1 MIL/uL — ABNORMAL LOW (ref 4.22–5.81)
RDW: 13.6 % (ref 11.5–15.5)
WBC: 7.4 10*3/uL (ref 4.0–10.5)
nRBC: 0 % (ref 0.0–0.2)

## 2023-06-07 LAB — COMPREHENSIVE METABOLIC PANEL
ALT: 15 U/L (ref 0–44)
AST: 20 U/L (ref 15–41)
Albumin: 3.9 g/dL (ref 3.5–5.0)
Alkaline Phosphatase: 45 U/L (ref 38–126)
Anion gap: 13 (ref 5–15)
BUN: 35 mg/dL — ABNORMAL HIGH (ref 8–23)
CO2: 31 mmol/L (ref 22–32)
Calcium: 8.9 mg/dL (ref 8.9–10.3)
Chloride: 92 mmol/L — ABNORMAL LOW (ref 98–111)
Creatinine, Ser: 6.95 mg/dL — ABNORMAL HIGH (ref 0.61–1.24)
GFR, Estimated: 7 mL/min — ABNORMAL LOW (ref >60)
Glucose, Bld: 205 mg/dL — ABNORMAL HIGH (ref 70–99)
Potassium: 4 mmol/L (ref 3.5–5.1)
Sodium: 136 mmol/L (ref 135–145)
Total Bilirubin: 0.5 mg/dL (ref 0.3–1.2)
Total Protein: 7.5 g/dL (ref 6.5–8.1)

## 2023-06-07 LAB — LIPASE, BLOOD: Lipase: 81 U/L — ABNORMAL HIGH (ref 11–51)

## 2023-06-07 MED ORDER — HYDROMORPHONE HCL 1 MG/ML IJ SOLN
1.0000 mg | Freq: Once | INTRAMUSCULAR | Status: AC
  Administered 2023-06-07: 1 mg via INTRAVENOUS
  Filled 2023-06-07: qty 1

## 2023-06-07 MED ORDER — ONDANSETRON HCL 4 MG/2ML IJ SOLN
4.0000 mg | Freq: Once | INTRAMUSCULAR | Status: AC
  Administered 2023-06-07: 4 mg via INTRAVENOUS
  Filled 2023-06-07: qty 2

## 2023-06-07 MED ORDER — IOHEXOL 350 MG/ML SOLN
75.0000 mL | Freq: Once | INTRAVENOUS | Status: AC | PRN
  Administered 2023-06-07: 75 mL via INTRAVENOUS

## 2023-06-07 MED ORDER — SODIUM CHLORIDE 0.9 % IV BOLUS
1000.0000 mL | Freq: Once | INTRAVENOUS | Status: AC
  Administered 2023-06-07: 1000 mL via INTRAVENOUS

## 2023-06-07 MED ORDER — OMEPRAZOLE 20 MG PO CPDR: 20.0000 mg | DELAYED_RELEASE_CAPSULE | Freq: Every day | ORAL | 1 refills | Status: AC

## 2023-06-07 NOTE — ED Provider Notes (Signed)
[facility] DEPT [facility] Emergency Department Provider Note MRN:  [id number]  Arrival date & time: 06/07/23     Chief Complaint   Abdominal Pain and Vomiting   History of Present Illness   Ryan Jimenez is a 89 y.o. year-old male with a history of diabetes, ESRD presenting to the ED with chief complaint of abdominal pain.  Generalized abdominal pain for the past few hours with nausea vomiting.  No diarrhea, no fever, no chest pain or shortness of breath.  Review of Systems  A thorough review of systems was obtained and all systems are negative except as noted in the HPI and PMH.   Patient's Health History    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   Diabetes mellitus    Dyspnea    Dysrhythmia    Glomerulonephritis    Gout    History of pneumonia    History of right bundle branch block (RBBB)    Hypercholesterolemia    Hypertension    Near syncope 10/2011; 11/2011   Pancytopenia    Pneumonia    Renal disorder    Shortness of breath on exertion    Type 2 diabetes mellitus (HCC)     Past Surgical History:  Procedure Laterality Date   AV FISTULA PLACEMENT Right 03/10/2020   Procedure: RIGHT BRACHIOCEPHALIC ARTERIOVENOUS (AV) FISTULA CREATION;  Surgeon: Sherren Kerns, MD;  Location[facility] OR;  Service: Vascular;  Laterality: Right;   CATARACT EXTRACTION W/ INTRAOCULAR LENS IMPLANT  ~ 2009   right   FISTULA SUPERFICIALIZATION Right 05/11/2020   Procedure: FISTULA REVISION, BRANCH LIGATION AND SUPERFICIALIZATION OF RIGHT UPPER ARM FISTULA;  Surgeon: Cephus Shelling, MD;  Location: [facility] OR;  Service: Vascular;  Laterality: Right;   HYDROCELE EXCISION      Family History  Problem Relation Age of Onset   Hypertension Mother    Heart disease Mother    Diabetes Sister     Social History   Socioeconomic History   Marital status: Single    Spouse name: Not on file   Number  of children: Not on file   Years of education: Not on file   Highest education level: Not on file  Occupational History   Not on file  Tobacco Use   Smoking status: Not on file   Smokeless tobacco: Never  Vaping Use   Vaping status: Never Used  Substance and Sexual Activity   Alcohol use: Not Currently    Comment: 11/23/11 "couple ounces q month maybe"   Drug use: No   Sexual activity: Not Currently  Other Topics Concern   Not on file  Social History Narrative   ** Merged History Encounter **       Social Determinants of Health   Financial Resource Strain: Not on file  Food Insecurity: Not on file  Transportation Needs: Not on file  Physical Activity: Not on file  Stress: Not on file  Social Connections: Not on file  Intimate Partner Violence: Not on file     Physical Exam   Vitals:   06/07/23 0328 06/07/23 0330  BP:  (!) 136/43  Pulse: 64 63  Resp: 17 19  Temp:    SpO2: 97% 100%    CONSTITUTIONAL: Well-appearing, NAD NEURO/PSYCH:  Alert and oriented x 3, no focal deficits EYES:  eyes equal and reactive ENT/NECK:  no LAD,  no JVD CARDIO: Regular rate, well-perfused, normal S1 and S2 PULM:  CTAB no wheezing or rhonchi GI/GU:  non-distended, mild diffuse tenderness MSK/SPINE:  No gross deformities, no edema SKIN:  no rash, atraumatic   *Additional and/or pertinent findings included in MDM below  Diagnostic and Interventional Summary    EKG Interpretation Date/Time:  Wednesday June 07 2023 01:18:04 EDT Ventricular Rate:  64 PR Interval:  154 QRS Duration:  147 QT Interval:  487 QTC Calculation: 503 R Axis:   82  Text Interpretation: Sinus rhythm Right bundle branch block Confirmed by Kennis Carina [phone]) on 06/07/2023 2:01:03 AM       Labs Reviewed  CBC - Abnormal; Notable for the following components:      Result Value   RBC 4.10 (*)    Hemoglobin 11.9 (*)    HCT 37.3 (*)    Platelets 128 (*)    All other components within normal limits   COMPREHENSIVE METABOLIC PANEL - Abnormal; Notable for the following components:   Chloride 92 (*)    Glucose, Bld 205 (*)    BUN 35 (*)    Creatinine, Ser 6.95 (*)    GFR, Estimated 7 (*)    All other components within normal limits  LIPASE, BLOOD - Abnormal; Notable for the following components:   Lipase 81 (*)    All other components within normal limits  URINALYSIS, ROUTINE W REFLEX MICROSCOPIC    CT ABDOMEN PELVIS W CONTRAST  Final Result      Medications  sodium chloride 0.9 % bolus 1,000 mL (0 mLs Intravenous Stopped 06/07/23 0245)  ondansetron (ZOFRAN) injection 4 mg (4 mg Intravenous Given 06/07/23 0114)  HYDROmorphone (DILAUDID) injection 1 mg (1 mg Intravenous Given 06/07/23 0114)  iohexol (OMNIPAQUE) 350 MG/ML injection 75 mL (75 mLs Intravenous Contrast Given 06/07/23 0244)     Procedures  /  Critical Care Procedures  ED Course and Medical Decision Making  Initial Impression and Ddx Differential diagnosis includes perforated viscus, appendicitis, diverticulitis, pancreatitis, viral gastroenteritis  Past medical/surgical history that increases complexity of ED encounter: ESRD  Interpretation of Diagnostics I personally reviewed the EKG and my interpretation is as follows: Sinus rhythm right bundle branch block no significant change from prior  Labs overall reassuring with no significant blood count or electrolyte disturbance, anticipated elevation of BUN/creatinine in the setting of ESRD  Patient Reassessment and Ultimate Disposition/Management     Patient is feeling a lot better continued normal vital signs.  Workup overall reassuring, CT scan revealing evidence of gastritis, no emergent process.  Also cholelithiasis but no signs of cholecystitis.  Appropriate for discharge.  Patient management required discussion with the following services or consulting groups:  None  Complexity of Problems Addressed Acute illness or injury that poses threat of life of bodily  function  Additional Data Reviewed and Analyzed Further history obtained from: Further history from spouse/family member  Additional Factors Impacting ED Encounter Risk Prescriptions and Use of parenteral controlled substances  Elmer Sow. Pilar Plate, MD [facility] Emergency Medicine [facility] [email or web address]  Final Clinical Impressions(s) / ED Diagnoses     ICD-10-CM   1. Generalized abdominal pain  R10.84       ED Discharge Orders          Ordered    omeprazole (PRILOSEC) 20 MG capsule  Daily        06/07/23 0347             Discharge Instructions Discussed with and  Provided to Patient:     Discharge Instructions      You were evaluated in the Emergency Department and after careful evaluation, we did not find any emergent condition requiring admission or further testing in the hospital.  Your exam/testing today is overall reassuring.  Symptoms seem to be due to gastritis.  Take the omeprazole medication daily and follow-up with your regular doctors.  Please return to the Emergency Department if you experience any worsening of your condition.   Thank you for allowing [place] to be a part of your care.       Sabas Sous, MD 06/07/23 [phone]

## 2023-06-07 NOTE — ED Triage Notes (Signed)
Pt BIB [facility] for new onset mid abdominal pain with couple episodes of vomiting. Reports ate earlier and began having cramping with nausea and vomiting. Reports took Tums w/o relief. Dialysis patient, fistula to the right. More lethargic per EMS about 10 mins PTA. Pt A&OX4.

## 2023-06-07 NOTE — Discharge Instructions (Signed)
You were evaluated in the Emergency Department and after careful evaluation, we did not find any emergent condition requiring admission or further testing in the hospital.  Your exam/testing today is overall reassuring.  Symptoms seem to be due to gastritis.  Take the omeprazole medication daily and follow-up with your regular doctors.  Please return to the Emergency Department if you experience any worsening of your condition.   Thank you for allowing [place] to be a part of your care.

## 2023-06-07 NOTE — ED Notes (Signed)
Patient made aware that need urine specimen. Reports produces small amount of urine daily. Dialysis patient.

## 2023-06-19 ENCOUNTER — Emergency Department (HOSPITAL_COMMUNITY): Payer: Medicare Other

## 2023-06-19 ENCOUNTER — Inpatient Hospital Stay (HOSPITAL_COMMUNITY): Payer: Medicare Other

## 2023-06-19 ENCOUNTER — Inpatient Hospital Stay (HOSPITAL_COMMUNITY)
Admission: EM | Admit: 2023-06-19 | Discharge: 2023-06-23 | DRG: 417 | Disposition: A | Discharge status: Home Health | Payer: Medicare Other | Attending: Internal Medicine | Admitting: Internal Medicine

## 2023-06-19 ENCOUNTER — Other Ambulatory Visit: Payer: Self-pay

## 2023-06-19 DIAGNOSIS — R7401 Elevation of levels of liver transaminase levels: Secondary | ICD-10-CM | POA: Diagnosis present

## 2023-06-19 DIAGNOSIS — N189 Chronic kidney disease, unspecified: Secondary | ICD-10-CM | POA: Diagnosis present

## 2023-06-19 DIAGNOSIS — E871 Hypo-osmolality and hyponatremia: Secondary | ICD-10-CM | POA: Diagnosis present

## 2023-06-19 DIAGNOSIS — Z8249 Family history of ischemic heart disease and other diseases of the circulatory system: Secondary | ICD-10-CM

## 2023-06-19 DIAGNOSIS — R9431 Abnormal electrocardiogram [ECG] [EKG]: Secondary | ICD-10-CM

## 2023-06-19 DIAGNOSIS — R079 Chest pain, unspecified: Secondary | ICD-10-CM | POA: Diagnosis not present

## 2023-06-19 DIAGNOSIS — E7800 Pure hypercholesterolemia, unspecified: Secondary | ICD-10-CM | POA: Diagnosis present

## 2023-06-19 DIAGNOSIS — D500 Iron deficiency anemia secondary to blood loss (chronic): Secondary | ICD-10-CM | POA: Diagnosis not present

## 2023-06-19 DIAGNOSIS — I120 Hypertensive chronic kidney disease with stage 5 chronic kidney disease or end stage renal disease: Secondary | ICD-10-CM | POA: Diagnosis present

## 2023-06-19 DIAGNOSIS — Z7982 Long term (current) use of aspirin: Secondary | ICD-10-CM

## 2023-06-19 DIAGNOSIS — K8012 Calculus of gallbladder with acute and chronic cholecystitis without obstruction: Secondary | ICD-10-CM | POA: Diagnosis not present

## 2023-06-19 DIAGNOSIS — K82A1 Gangrene of gallbladder in cholecystitis: Secondary | ICD-10-CM | POA: Diagnosis present

## 2023-06-19 DIAGNOSIS — Z6832 Body mass index (BMI) 32.0-32.9, adult: Secondary | ICD-10-CM

## 2023-06-19 DIAGNOSIS — I509 Heart failure, unspecified: Secondary | ICD-10-CM

## 2023-06-19 DIAGNOSIS — E1122 Type 2 diabetes mellitus with diabetic chronic kidney disease: Secondary | ICD-10-CM | POA: Diagnosis present

## 2023-06-19 DIAGNOSIS — Z9841 Cataract extraction status, right eye: Secondary | ICD-10-CM

## 2023-06-19 DIAGNOSIS — M1990 Unspecified osteoarthritis, unspecified site: Secondary | ICD-10-CM | POA: Diagnosis present

## 2023-06-19 DIAGNOSIS — Z992 Dependence on renal dialysis: Secondary | ICD-10-CM | POA: Diagnosis not present

## 2023-06-19 DIAGNOSIS — Z833 Family history of diabetes mellitus: Secondary | ICD-10-CM | POA: Diagnosis not present

## 2023-06-19 DIAGNOSIS — J42 Unspecified chronic bronchitis: Secondary | ICD-10-CM | POA: Diagnosis present

## 2023-06-19 DIAGNOSIS — K819 Cholecystitis, unspecified: Secondary | ICD-10-CM | POA: Diagnosis not present

## 2023-06-19 DIAGNOSIS — Z794 Long term (current) use of insulin: Secondary | ICD-10-CM | POA: Diagnosis not present

## 2023-06-19 DIAGNOSIS — E1165 Type 2 diabetes mellitus with hyperglycemia: Secondary | ICD-10-CM | POA: Diagnosis present

## 2023-06-19 DIAGNOSIS — R7989 Other specified abnormal findings of blood chemistry: Secondary | ICD-10-CM | POA: Diagnosis present

## 2023-06-19 DIAGNOSIS — E1169 Type 2 diabetes mellitus with other specified complication: Secondary | ICD-10-CM

## 2023-06-19 DIAGNOSIS — N186 End stage renal disease: Secondary | ICD-10-CM

## 2023-06-19 DIAGNOSIS — K810 Acute cholecystitis: Secondary | ICD-10-CM | POA: Diagnosis present

## 2023-06-19 DIAGNOSIS — M109 Gout, unspecified: Secondary | ICD-10-CM | POA: Diagnosis present

## 2023-06-19 DIAGNOSIS — E669 Obesity, unspecified: Secondary | ICD-10-CM | POA: Diagnosis present

## 2023-06-19 DIAGNOSIS — K5900 Constipation, unspecified: Secondary | ICD-10-CM | POA: Diagnosis present

## 2023-06-19 DIAGNOSIS — R1013 Epigastric pain: Secondary | ICD-10-CM | POA: Diagnosis not present

## 2023-06-19 DIAGNOSIS — Z1152 Encounter for screening for COVID-19: Secondary | ICD-10-CM

## 2023-06-19 DIAGNOSIS — I4510 Unspecified right bundle-branch block: Secondary | ICD-10-CM | POA: Diagnosis present

## 2023-06-19 DIAGNOSIS — D631 Anemia in chronic kidney disease: Secondary | ICD-10-CM | POA: Diagnosis present

## 2023-06-19 DIAGNOSIS — I10 Essential (primary) hypertension: Secondary | ICD-10-CM | POA: Diagnosis present

## 2023-06-19 DIAGNOSIS — Z961 Presence of intraocular lens: Secondary | ICD-10-CM | POA: Diagnosis present

## 2023-06-19 DIAGNOSIS — R0902 Hypoxemia: Secondary | ICD-10-CM | POA: Diagnosis present

## 2023-06-19 DIAGNOSIS — E440 Moderate protein-calorie malnutrition: Secondary | ICD-10-CM | POA: Diagnosis present

## 2023-06-19 DIAGNOSIS — Z888 Allergy status to other drugs, medicaments and biological substances status: Secondary | ICD-10-CM

## 2023-06-19 DIAGNOSIS — Z79899 Other long term (current) drug therapy: Secondary | ICD-10-CM

## 2023-06-19 DIAGNOSIS — Z87891 Personal history of nicotine dependence: Secondary | ICD-10-CM

## 2023-06-19 LAB — COMPREHENSIVE METABOLIC PANEL
ALT: 783 U/L — ABNORMAL HIGH (ref 0–44)
AST: 1185 U/L — ABNORMAL HIGH (ref 15–41)
Albumin: 2.9 g/dL — ABNORMAL LOW (ref 3.5–5.0)
Alkaline Phosphatase: 229 U/L — ABNORMAL HIGH (ref 38–126)
Anion gap: 13 (ref 5–15)
BUN: 43 mg/dL — ABNORMAL HIGH (ref 8–23)
CO2: 28 mmol/L (ref 22–32)
Calcium: 8.4 mg/dL — ABNORMAL LOW (ref 8.9–10.3)
Chloride: 92 mmol/L — ABNORMAL LOW (ref 98–111)
Creatinine, Ser: 7.43 mg/dL — ABNORMAL HIGH (ref 0.61–1.24)
GFR, Estimated: 6 mL/min — ABNORMAL LOW (ref >60)
Glucose, Bld: 285 mg/dL — ABNORMAL HIGH (ref 70–99)
Potassium: 4.8 mmol/L (ref 3.5–5.1)
Sodium: 133 mmol/L — ABNORMAL LOW (ref 135–145)
Total Bilirubin: 1.1 mg/dL (ref 0.3–1.2)
Total Protein: 7 g/dL (ref 6.5–8.1)

## 2023-06-19 LAB — CBC WITH DIFFERENTIAL/PLATELET
Abs Immature Granulocytes: 0.08 10*3/uL — ABNORMAL HIGH (ref 0.00–0.07)
Basophils Absolute: 0 10*3/uL (ref 0.0–0.1)
Basophils Relative: 0 %
Eosinophils Absolute: 0.1 10*3/uL (ref 0.0–0.5)
Eosinophils Relative: 1 %
HCT: 34.6 % — ABNORMAL LOW (ref 39.0–52.0)
Hemoglobin: 10.8 g/dL — ABNORMAL LOW (ref 13.0–17.0)
Immature Granulocytes: 1 %
Lymphocytes Relative: 10 %
Lymphs Abs: 0.8 10*3/uL (ref 0.7–4.0)
MCH: 29.3 pg (ref 26.0–34.0)
MCHC: 31.2 g/dL (ref 30.0–36.0)
MCV: 93.8 fL (ref 80.0–100.0)
Monocytes Absolute: 0.6 10*3/uL (ref 0.1–1.0)
Monocytes Relative: 7 %
Neutro Abs: 6.5 10*3/uL (ref 1.7–7.7)
Neutrophils Relative %: 81 %
Platelets: 192 10*3/uL (ref 150–400)
RBC: 3.69 MIL/uL — ABNORMAL LOW (ref 4.22–5.81)
RDW: 13.9 % (ref 11.5–15.5)
WBC: 8 10*3/uL (ref 4.0–10.5)
nRBC: 0 % (ref 0.0–0.2)

## 2023-06-19 LAB — ECHOCARDIOGRAM COMPLETE
AR max vel: 3.31 cm2
AV Peak grad: 6.9 mmHg
Ao pk vel: 1.31 m/s
Area-P 1/2: 2.91 cm2
S' Lateral: 3.6 cm

## 2023-06-19 LAB — HEMOGLOBIN A1C
Hgb A1c MFr Bld: 6.4 % — ABNORMAL HIGH (ref 4.8–5.6)
Mean Plasma Glucose: 136.98 mg/dL

## 2023-06-19 LAB — GLUCOSE, CAPILLARY
Glucose-Capillary: 156 mg/dL — ABNORMAL HIGH (ref 70–99)
Glucose-Capillary: 175 mg/dL — ABNORMAL HIGH (ref 70–99)

## 2023-06-19 LAB — RESP PANEL BY RT-PCR (RSV, FLU A&B, COVID)  RVPGX2
Influenza A by PCR: NEGATIVE
Influenza B by PCR: NEGATIVE
Resp Syncytial Virus by PCR: NEGATIVE
SARS Coronavirus 2 by RT PCR: NEGATIVE

## 2023-06-19 LAB — HEPATITIS PANEL, ACUTE
HCV Ab: NONREACTIVE
Hep A IgM: NONREACTIVE
Hep B C IgM: NONREACTIVE
Hepatitis B Surface Ag: NONREACTIVE

## 2023-06-19 LAB — PROTIME-INR
INR: 1.1 (ref 0.8–1.2)
Prothrombin Time: 14.6 seconds (ref 11.4–15.2)

## 2023-06-19 LAB — ACETAMINOPHEN LEVEL: Acetaminophen (Tylenol), Serum: <10 ug/mL — ABNORMAL LOW (ref 10–30)

## 2023-06-19 LAB — LIPASE, BLOOD: Lipase: 44 U/L (ref 11–51)

## 2023-06-19 LAB — CBG MONITORING, ED: Glucose-Capillary: 182 mg/dL — ABNORMAL HIGH (ref 70–99)

## 2023-06-19 LAB — TROPONIN I (HIGH SENSITIVITY)
Troponin I (High Sensitivity): 15 ng/L (ref <18)
Troponin I (High Sensitivity): 14 ng/L (ref <18)

## 2023-06-19 LAB — HEPATITIS B SURFACE ANTIGEN: Hepatitis B Surface Ag: NONREACTIVE

## 2023-06-19 LAB — PHOSPHORUS: Phosphorus: 4.6 mg/dL (ref 2.5–4.6)

## 2023-06-19 MED ORDER — PIPERACILLIN-TAZOBACTAM 3.375 G IVPB 30 MIN
3.3750 g | Freq: Once | INTRAVENOUS | Status: AC
  Administered 2023-06-19: 3.375 g via INTRAVENOUS
  Filled 2023-06-19: qty 50

## 2023-06-19 MED ORDER — ACETAMINOPHEN 650 MG RE SUPP: 650.0000 mg | Freq: Four times a day (QID) | RECTAL | Status: DC | PRN

## 2023-06-19 MED ORDER — INSULIN ASPART 100 UNIT/ML IJ SOLN
0.0000 [IU] | Freq: Four times a day (QID) | INTRAMUSCULAR | Status: DC
  Administered 2023-06-19 – 2023-06-20 (×4): 1 [IU] via SUBCUTANEOUS

## 2023-06-19 MED ORDER — PIPERACILLIN-TAZOBACTAM IN DEX 2-0.25 GM/50ML IV SOLN
2.2500 g | Freq: Three times a day (TID) | INTRAVENOUS | Status: DC
  Administered 2023-06-19 – 2023-06-20 (×4): 2.25 g via INTRAVENOUS
  Filled 2023-06-19 (×7): qty 50

## 2023-06-19 MED ORDER — FENTANYL CITRATE PF 50 MCG/ML IJ SOSY: 25.0000 ug | PREFILLED_SYRINGE | INTRAMUSCULAR | Status: DC | PRN

## 2023-06-19 MED ORDER — IOHEXOL 350 MG/ML SOLN
75.0000 mL | Freq: Once | INTRAVENOUS | Status: AC | PRN
  Administered 2023-06-19: 75 mL via INTRAVENOUS

## 2023-06-19 MED ORDER — ONDANSETRON HCL 4 MG PO TABS: 4.0000 mg | ORAL_TABLET | Freq: Four times a day (QID) | ORAL | Status: DC | PRN

## 2023-06-19 MED ORDER — FENTANYL CITRATE PF 50 MCG/ML IJ SOSY
25.0000 ug | PREFILLED_SYRINGE | INTRAMUSCULAR | Status: DC | PRN
  Administered 2023-06-19: 25 ug via INTRAVENOUS
  Filled 2023-06-19: qty 1

## 2023-06-19 MED ORDER — FENTANYL CITRATE PF 50 MCG/ML IJ SOSY
50.0000 ug | PREFILLED_SYRINGE | Freq: Once | INTRAMUSCULAR | Status: AC
  Administered 2023-06-19: 50 ug via INTRAVENOUS
  Filled 2023-06-19: qty 1

## 2023-06-19 MED ORDER — PERFLUTREN LIPID MICROSPHERE
1.0000 mL | INTRAVENOUS | Status: AC | PRN
  Administered 2023-06-19: 3 mL via INTRAVENOUS

## 2023-06-19 MED ORDER — FENTANYL CITRATE PF 50 MCG/ML IJ SOSY
50.0000 ug | PREFILLED_SYRINGE | INTRAMUSCULAR | Status: DC | PRN
  Administered 2023-06-19: 50 ug via INTRAVENOUS
  Filled 2023-06-19: qty 1

## 2023-06-19 MED ORDER — ONDANSETRON HCL 4 MG/2ML IJ SOLN
4.0000 mg | Freq: Once | INTRAMUSCULAR | Status: AC
  Administered 2023-06-19: 4 mg via INTRAVENOUS
  Filled 2023-06-19: qty 2

## 2023-06-19 MED ORDER — FENTANYL CITRATE PF 50 MCG/ML IJ SOSY: 50.0000 ug | PREFILLED_SYRINGE | Freq: Once | INTRAMUSCULAR | Status: DC

## 2023-06-19 MED ORDER — ACETAMINOPHEN 325 MG PO TABS: 650.0000 mg | ORAL_TABLET | Freq: Four times a day (QID) | ORAL | Status: DC | PRN

## 2023-06-19 MED ORDER — FAMOTIDINE 20 MG PO TABS
20.0000 mg | ORAL_TABLET | Freq: Once | ORAL | Status: DC
  Filled 2023-06-19: qty 1

## 2023-06-19 MED ORDER — HYDROMORPHONE HCL 1 MG/ML IJ SOLN
0.5000 mg | Freq: Once | INTRAMUSCULAR | Status: AC
  Administered 2023-06-19: 0.5 mg via INTRAVENOUS
  Filled 2023-06-19: qty 1

## 2023-06-19 MED ORDER — HYDRALAZINE HCL 20 MG/ML IJ SOLN: 10.0000 mg | INTRAMUSCULAR | Status: DC | PRN

## 2023-06-19 MED ORDER — ONDANSETRON HCL 4 MG/2ML IJ SOLN: 4.0000 mg | Freq: Four times a day (QID) | INTRAMUSCULAR | Status: DC | PRN

## 2023-06-19 MED ORDER — CHLORHEXIDINE GLUCONATE CLOTH 2 % EX PADS
6.0000 | MEDICATED_PAD | Freq: Every day | CUTANEOUS | Status: DC
  Administered 2023-06-20: 6 via TOPICAL

## 2023-06-19 MED ORDER — METOPROLOL TARTRATE 5 MG/5ML IV SOLN
5.0000 mg | Freq: Two times a day (BID) | INTRAVENOUS | Status: DC
  Administered 2023-06-19: 5 mg via INTRAVENOUS
  Filled 2023-06-19 (×2): qty 5

## 2023-06-19 NOTE — Consult Note (Addendum)
Consultation Note   Referring Provider:  Emergency Services PCP: Fleet Contras, MD Primary Gastroenterologist: Gentry Fitz        Reason for Consultation: elevated liver tests  DOA: 06/19/2023         Hospital Day: 1   ASSESSMENT & PLAN   Patient is a 89 y.o. year old male with a past medical history o fhypertension, diabetes, ESRD on HD MWF, diverticulosis, cholelithiasis   89 yo male with 2 weeks of chest and epigastric pain, N/V and now new elevation in liver tests of mixed pattern.   Imaging shows sludge / a gallstone in gallbladder. No evidence for choledocholithiasis.  CT scan shows gallbladder wall thickening and pericholecystic fluid raising concern for acute cholecystitis. Ischemic liver injury is also possible but doesn't typically cause this degree of symptoms.  -Acute hepatitis panel pending.  -Acetaminophen level negative -check am liver chemistries -? Surgical evaluation  Abnormal stomach on CT scan , ? Gastritis. -Will need eventual EGD  Chronic [place] anemia. Likely 2/2 chronic disease / CKD. Hgb stable  ESRD on HD MWF  See PMH for additional medical history   ------------------------------------------------------------------------------------------------------    [facility] Attending   I have taken an interval history, reviewed the chart and examined the patient. I agree with the Advanced Practitioner's note, impression and recommendations and have the following additions:  I reviewed CT and [place] images myself  Balance of evidence suggests acute cholecystitis, I think. I found him to have tenderness in RUQ and epigastrium - moderate and guarded w/ percussion, some tympany in RUQ  GSU has seen and PA has similar thoughts.  Await GSU attending input re: HIDA vs lap chole. No role for ERCP at this time.  Will follow.  Iva Boop, MD, [facility] See Loretha Stapler on call - gastroenterology for best  contact person 06/19/2023 11:11 AM  HISTORY OF PRESENT ILLNESS   Patient lives in [place] part time and [place] part time. He presented to ED today with epigastric pain , nausea and vomiting. Symptoms started a couple of weeks ago. He was seen in ED on 7/24 for same symptoms. Lipase was elevated less than 2 x ULN and liver chemistries were normal. CT AP with contrast at that time showed thickened folds in mid and distal stomach.   He describes chest and epigastric pain which doesn't radiate anywhere. Pain nearly constant,  worse with food. He takes a daily baby asa, no other NSAIDS. He has been taking 4 Tylenol a day for two months. He started a phosphate binder and Omega XL a few weeks ago, no other new medications. Patient says has never been told that he has any liver issues prior to  now.   ED / Admission workup notable for :  Resp 27 and 02 92% on arrival Normal WBC Hgb 10.8 ( baseline ~ 12) Normal MCV Normal platelets Alk phos 229, AST 1185, ALT 783, Tbili 1.1 (LFTs previously normal) Lipase 44 Tylenol level < 10 Acute Hep panel pending  CT AP with contrast IMPRESSION: Gallbladder is distended. There is wall thickening in gallbladder and pericholecystic fluid. Possibility of cholecystitis is not excluded. Please correlate with findings in the sonogram done earlier today.   There is mild periportal  edema which may be due to nonspecific hepatitis. There is small perihepatic ascites.   There is no evidence of intestinal obstruction or pneumoperitoneum. There is no hydronephrosis. Appendix is not dilated.   Diverticulosis of colon without signs of diverticulitis. Aortic arteriosclerosis. Coronary artery calcifications are seen. Lumbar spondylosis.   There are patchy infiltrates in the lower lung fields with interval worsening in right lower lung field suggesting atelectasis/pneumonia. Minimal right pleural effusion.    RUQ US IMPRESSION: Gallbladder sludge and equivocal  stone in the gallbladder neck. A small gallstone was seen by CT recently. No evidence of cholecystitis. CBD 7 mm  Previous GI Evaluations     Labs and Imaging: Recent Labs    06/19/23 0545  WBC 8.0  HGB 10.8*  HCT 34.6*  PLT 192   Recent Labs    06/19/23 0545  NA 133*  K 4.8  CL 92*  CO2 28  GLUCOSE 285*  BUN 43*  CREATININE 7.43*  CALCIUM 8.4*   Recent Labs    06/19/23 0545  PROT 7.0  ALBUMIN 2.9*  AST 1,185*  ALT 783*  ALKPHOS 229*  BILITOT 1.1   No results for input(s): "HEPBSAG", "HCVAB", "HEPAIGM", "HEPBIGM" in the last 72 hours. Recent Labs    06/19/23 0746  LABPROT 14.6  INR 1.1   EKG EKG Interpretation Date/Time:                  Monday June 19 2023 04:56:05 EDT Ventricular Rate:         70 PR Interval:                 196 QRS Duration:             138 QT Interval:                 454 QTC Calculation:490 R Axis:                         75   Text Interpretation:Sinus rhythm Right bundle branch block Similar to July 2024 tracing Confirmed by Alona Bene [phone]) on 06/19/2023 5:13:16 AM   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   Diabetes mellitus    Dyspnea    Dysrhythmia    Glomerulonephritis    Gout    History of pneumonia    History of right bundle branch block (RBBB)    Hypercholesterolemia    Hypertension    Near syncope 10/2011; 11/2011   Pancytopenia    Pneumonia    Renal disorder    Shortness of breath on exertion    Type 2 diabetes mellitus (HCC)     Past Surgical History:  Procedure Laterality Date   AV FISTULA PLACEMENT Right 03/10/2020   Procedure: RIGHT BRACHIOCEPHALIC ARTERIOVENOUS (AV) FISTULA CREATION;  Surgeon: Sherren Kerns, MD;  [facility];  Service: Vascular;  Laterality: Right;   CATARACT EXTRACTION W/ INTRAOCULAR LENS IMPLANT  ~ 2009   right   FISTULA SUPERFICIALIZATION Right 05/11/2020   Procedure: FISTULA  REVISION, BRANCH LIGATION AND SUPERFICIALIZATION OF RIGHT UPPER ARM FISTULA;  Surgeon: Cephus Shelling, MD;  Location: [facility] OR;  Service: Vascular;  Laterality: Right;   HYDROCELE EXCISION      Family History  Problem Relation Age of Onset   Hypertension Mother    Heart disease Mother  Diabetes Sister     Prior to Admission medications   Medication Sig Start Date End Date Taking? Authorizing Provider  albuterol (PROVENTIL) (2.5 MG/3ML) 0.083% nebulizer solution Take 3 mLs (2.5 mg total) by nebulization every 6 (six) hours as needed for wheezing or shortness of breath. 05/20/20 06/19/20  Lanae Boast, MD  albuterol (VENTOLIN HFA) 108 (90 Base) MCG/ACT inhaler Inhale 2 puffs into the lungs every 6 (six) hours as needed (wheezing/cough). 05/20/20   Lanae Boast, MD  allopurinol (ZYLOPRIM) 100 MG tablet Take 100 mg by mouth daily.    [provider]  amLODipine (NORVASC) 10 MG tablet Take 10 mg by mouth daily.    [provider]  aspirin EC 81 MG tablet Take 81 mg by mouth daily.    [provider]  benzonatate (TESSALON) 100 MG capsule Take 200 mg by mouth every 8 (eight) hours as needed for cough.  04/15/20   [provider]  carvedilol (COREG) 12.5 MG tablet Take 1 tablet (12.5 mg total) by mouth 2 (two) times daily with a meal. Patient taking differently: Take 12.5 mg by mouth 2 (two) times daily with a meal. 10am and 10pm 05/02/13   Angiulli, Mcarthur Rossetti, PA-C  cetirizine (ZYRTEC) 10 MG tablet Take 10 mg by mouth daily as needed (cough).    [provider]  colchicine 0.6 MG tablet Take 0.6 mg by mouth daily as needed (gout).     [provider]  dutasteride (AVODART) 0.5 MG capsule Take 0.5 mg by mouth daily.    [provider]  fluticasone (FLONASE) 50 MCG/ACT nasal spray Place 2 sprays into both nostrils daily. Patient taking differently: Place 2 sprays into both nostrils at bedtime.  05/29/18   Cathie Hoops, Amy V, PA-C  furosemide (LASIX) 80 MG  tablet Take 1 tablet (80 mg total) by mouth 2 (two) times daily. 05/20/20 06/19/20  Lanae Boast, MD  hydrALAZINE (APRESOLINE) 25 MG tablet Take 25 mg by mouth 2 (two) times daily. 03/02/20   [provider]  insulin aspart (NOVOLOG) 100 UNIT/ML injection Inject 4-10 Units into the skin 3 (three) times daily as needed for high blood sugar.     [provider]  LEVEMIR FLEXTOUCH 100 UNIT/ML Pen Inject 30-45 Units into the skin at bedtime as needed (high blood sugar). Per sliding scale 01/30/15   [provider]  mirtazapine (REMERON) 7.5 MG tablet Take 7.5 mg by mouth every evening. 04/29/20   [provider]  Multiple Vitamin (MULTIVITAMIN WITH MINERALS) TABS tablet Take 1 tablet by mouth daily.    [provider]  NOREL AD 4-10-325 MG TABS Take 1 tablet by mouth 2 (two) times daily as needed (pain/cough).  05/05/20   [provider]  omeprazole (PRILOSEC) 20 MG capsule Take 1 capsule (20 mg total) by mouth daily. 06/07/23   Sabas Sous, MD  oxyCODONE-acetaminophen (PERCOCET) 5-325 MG tablet Take 1 tablet by mouth every 6 (six) hours as needed for severe pain. 05/11/20   Rhyne, Ames Coupe, PA-C  rosuvastatin (CRESTOR) 10 MG tablet Take 1 tablet (10 mg total) by mouth at bedtime. 05/02/13   Angiulli, Mcarthur Rossetti, PA-C  tamsulosin (FLOMAX) 0.4 MG CAPS Take 1 capsule (0.4 mg total) by mouth at bedtime. Patient taking differently: Take 0.8 mg by mouth at bedtime.  05/02/13   Angiulli, Mcarthur Rossetti, PA-C  Tetrahydroz-Dextran-PEG-Povid (EYE DROPS ADVANCED RELIEF OP) Place 1 drop into both eyes daily as needed (dry eyes).     [provider]    Current Facility-Administered Medications  Medication Dose Route Frequency Provider Last Rate Last Admin   famotidine (PEPCID) tablet 20 mg  20 mg Oral Once Carroll Sage, PA-C       Current Outpatient Medications  Medication Sig Dispense Refill   albuterol (PROVENTIL) (2.5 MG/3ML) 0.083% nebulizer solution  Take 3 mLs (2.5 mg total) by nebulization every 6 (six) hours as needed for wheezing or shortness of breath. 75 mL 0   albuterol (VENTOLIN HFA) 108 (90 Base) MCG/ACT inhaler Inhale 2 puffs into the lungs every 6 (six) hours as needed (wheezing/cough). 6.7 g 0   allopurinol (ZYLOPRIM) 100 MG tablet Take 100 mg by mouth daily.     amLODipine (NORVASC) 10 MG tablet Take 10 mg by mouth daily.     aspirin EC 81 MG tablet Take 81 mg by mouth daily.     benzonatate (TESSALON) 100 MG capsule Take 200 mg by mouth every 8 (eight) hours as needed for cough.      carvedilol (COREG) 12.5 MG tablet Take 1 tablet (12.5 mg total) by mouth 2 (two) times daily with a meal. (Patient taking differently: Take 12.5 mg by mouth 2 (two) times daily with a meal. 10am and 10pm) 60 tablet 1   cetirizine (ZYRTEC) 10 MG tablet Take 10 mg by mouth daily as needed (cough).     colchicine 0.6 MG tablet Take 0.6 mg by mouth daily as needed (gout).      dutasteride (AVODART) 0.5 MG capsule Take 0.5 mg by mouth daily.     fluticasone (FLONASE) 50 MCG/ACT nasal spray Place 2 sprays into both nostrils daily. (Patient taking differently: Place 2 sprays into both nostrils at bedtime. ) 1 g 0   furosemide (LASIX) 80 MG tablet Take 1 tablet (80 mg total) by mouth 2 (two) times daily. 60 tablet 0   hydrALAZINE (APRESOLINE) 25 MG tablet Take 25 mg by mouth 2 (two) times daily.     insulin aspart (NOVOLOG) 100 UNIT/ML injection Inject 4-10 Units into the skin 3 (three) times daily as needed for high blood sugar.      LEVEMIR FLEXTOUCH 100 UNIT/ML Pen Inject 30-45 Units into the skin at bedtime as needed (high blood sugar). Per sliding scale  2   mirtazapine (REMERON) 7.5 MG tablet Take 7.5 mg by mouth every evening.     Multiple Vitamin (MULTIVITAMIN WITH MINERALS) TABS tablet Take 1 tablet by mouth daily.     NOREL AD 4-10-325 MG TABS Take 1 tablet by mouth 2 (two) times daily as needed (pain/cough).      omeprazole (PRILOSEC) 20 MG capsule  Take 1 capsule (20 mg total) by mouth daily. 30 capsule 1   oxyCODONE-acetaminophen (PERCOCET) 5-325 MG tablet Take 1 tablet by mouth every 6 (six) hours as needed for severe pain. 12 tablet 0   rosuvastatin (CRESTOR) 10 MG tablet Take 1 tablet (10 mg total) by mouth at bedtime. 30 tablet 1   tamsulosin (FLOMAX) 0.4 MG CAPS Take 1 capsule (0.4 mg total) by mouth at bedtime. (Patient taking differently: Take 0.8 mg by mouth at bedtime. ) 30 capsule 1   Tetrahydroz-Dextran-PEG-Povid (EYE DROPS ADVANCED RELIEF OP) Place 1 drop into both eyes daily as needed (dry eyes).       Allergies as of 06/19/2023 - Review Complete 06/19/2023  Allergen Reaction Noted   Clonidine derivatives Other (See Comments) 06/30/2015    Social History   Socioeconomic History   Marital status: Single  Spouse name: Not on file   Number of children: Not on file   Years of education: Not on file   Highest education level: Not on file  Occupational History   Not on file  Tobacco Use   Smoking status: Not on file   Smokeless tobacco: Never  Vaping Use   Vaping status: Never Used  Substance and Sexual Activity   Alcohol use: Not Currently    Comment: 11/23/11 "couple ounces q month maybe"   Drug use: No   Sexual activity: Not Currently  Other Topics Concern   Not on file  Social History Narrative   ** Merged History Encounter **       Social Determinants of Health   Financial Resource Strain: Not on file  Food Insecurity: Not on file  Transportation Needs: Not on file  Physical Activity: Not on file  Stress: Not on file  Social Connections: Not on file  Intimate Partner Violence: Not on file     Code Status   Code Status: Prior  Review of Systems: All systems reviewed and negative except where noted in HPI.  Physical Exam: Vital signs in last 24 hours: Temp:  [98.1 F (36.7 C)] 98.1 F (36.7 C) (08/05 0806) Pulse Rate:  [62-67] 63 (08/05 0815) Resp:  [15-27] 17 (08/05 0815) BP:  (132-164)/(55-71) 143/58 (08/05 0800) SpO2:  [83 %-100 %] 99 % (08/05 0815)    General:  Pleasant male in NAD Psych:  Cooperative. Normal mood and affect Eyes: Pupils equal Ears:  Normal auditory acuity Nose: No deformity, discharge or lesions Neck:  Supple, no masses felt Lungs:  Clear to auscultation.  Heart:  Regular rate, regular rhythm.  Abdomen:  Soft, nondistended, mild epigastric tenderness,  active bowel sounds, no masses felt Rectal :  Deferred Msk: Symmetrical without gross deformities.  Neurologic:  Alert, oriented, grossly normal neurologically Extremities : No edema Skin:  Intact without significant lesions.       Willette Cluster, NP-C @  06/19/2023, 9:28 AM

## 2023-06-19 NOTE — Progress Notes (Signed)
Pharmacy Antibiotic Note  Ryan Jimenez is a 89 y.o. male admitted on 06/19/2023 presenting with abdominal pain, possible cholecystitis.  Pharmacy has been consulted for zosyn dosing.  Plan: Zosyn 2.25 G IV q 8h Monitor HD schedule, intra-abdominal workup and LOT     Temp (24hrs), Avg:98.1 F (36.7 C), Min:98.1 F (36.7 C), Max:98.1 F (36.7 C)  Recent Labs  Lab 06/19/23 0545  WBC 8.0  CREATININE 7.43*    Estimated Creatinine Clearance: 7.5 mL/min (A) (by C-G formula based on SCr of 7.43 mg/dL (H)).    Allergies  Allergen Reactions   Clonidine Derivatives Other (See Comments)    Not known    Daylene Posey, PharmD, [facility] Clinical Pharmacist ED Pharmacist Phone # [phone] 06/19/2023 10:04 AM

## 2023-06-19 NOTE — Consult Note (Signed)
Renal Service Consult Note [facility]  Ryan Jimenez 06/19/2023 Ryan Krabbe, MD Requesting Physician: Dr. Robb Matar   Reason for Consult: ESRD pt w/ abd pain HPI: The patient is a 89 y.o. year-old w/ PMH as below who presented to ED today w/ abdominal pain, N/V. Pt seen in ED and LFT"s are newly elevated ( was in ED last week) and CT scan may suggest acute cholecystitis. Pt being seen by GI and gen surgery. He may need surgery later today per surgery note. We are asked to see for ESRD.    Pt seen in room. Pt c/o abd pain, not in distress.  No SOB or cough. No fevers.   ROS - denies CP, no joint pain, no HA, no blurry vision, no dysuria, no difficulty voiding   Past Medical History  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   Diabetes mellitus    Dyspnea    Dysrhythmia    Glomerulonephritis    Gout    History of pneumonia    History of right bundle branch block (RBBB)    Hypercholesterolemia    Hypertension    Near syncope 10/2011; 11/2011   Pancytopenia    Pneumonia    Renal disorder    Shortness of breath on exertion    Type 2 diabetes mellitus (HCC)    Past Surgical History  Past Surgical History:  Procedure Laterality Date   AV FISTULA PLACEMENT Right 03/10/2020   Procedure: RIGHT BRACHIOCEPHALIC ARTERIOVENOUS (AV) FISTULA CREATION;  Surgeon: Sherren Kerns, MD;  [facility]: [facility];  Service: Vascular;  Laterality: Right;   CATARACT EXTRACTION W/ INTRAOCULAR LENS IMPLANT  ~ 2009   right   FISTULA SUPERFICIALIZATION Right 05/11/2020   Procedure: FISTULA REVISION, BRANCH LIGATION AND SUPERFICIALIZATION OF RIGHT UPPER ARM FISTULA;  Surgeon: Cephus Shelling, MD;  Location: [facility] OR;  Service: Vascular;  Laterality: Right;   HYDROCELE EXCISION     Family History  Family History  Problem Relation Age of Onset   Hypertension Mother    Heart disease Mother    Diabetes  Sister    Social History  reports that he does not have a smoking history on file. He has never used smokeless tobacco. He reports that he does not currently use alcohol. He reports that he does not use drugs. Allergies  Allergies  Allergen Reactions   Clonidine Derivatives Other (See Comments)    Not known   Home medications Prior to Admission medications   Medication Sig Start Date End Date Taking? Authorizing Provider  albuterol (PROVENTIL) (2.5 MG/3ML) 0.083% nebulizer solution Take 3 mLs (2.5 mg total) by nebulization every 6 (six) hours as needed for wheezing or shortness of breath. 05/20/20 06/19/20  Lanae Boast, MD  albuterol (VENTOLIN HFA) 108 (90 Base) MCG/ACT inhaler Inhale 2 puffs into the lungs every 6 (six) hours as needed (wheezing/cough). 05/20/20   Lanae Boast, MD  allopurinol (ZYLOPRIM) 100 MG tablet Take 100 mg by mouth daily.    [provider]  amLODipine (NORVASC) 10 MG tablet Take 10 mg by mouth daily.    [provider]  aspirin EC 81 MG tablet Take 81 mg by mouth daily.    [provider]  benzonatate (TESSALON) 100 MG capsule Take 200 mg by mouth every 8 (eight) hours as needed for cough.  04/15/20   [provider]  carvedilol (COREG) 12.5 MG tablet Take 1 tablet (12.5 mg total) by mouth 2 (two) times daily with a meal. Patient taking differently: Take 12.5 mg by mouth 2 (two) times daily with a meal. 10am and 10pm 05/02/13   Angiulli, Mcarthur Rossetti, PA-C  cetirizine (ZYRTEC) 10 MG tablet Take 10 mg by mouth daily as needed (cough).    [provider]  colchicine 0.6 MG tablet Take 0.6 mg by mouth daily as needed (gout).     [provider]  dutasteride (AVODART) 0.5 MG capsule Take 0.5 mg by mouth daily.    [provider]  fluticasone (FLONASE) 50 MCG/ACT nasal spray Place 2 sprays into both nostrils daily. Patient taking differently: Place 2 sprays into both nostrils at bedtime.  05/29/18   Cathie Hoops, Amy V, PA-C   furosemide (LASIX) 80 MG tablet Take 1 tablet (80 mg total) by mouth 2 (two) times daily. 05/20/20 06/19/20  Lanae Boast, MD  hydrALAZINE (APRESOLINE) 25 MG tablet Take 25 mg by mouth 2 (two) times daily. 03/02/20   [provider]  insulin aspart (NOVOLOG) 100 UNIT/ML injection Inject 4-10 Units into the skin 3 (three) times daily as needed for high blood sugar.     [provider]  LEVEMIR FLEXTOUCH 100 UNIT/ML Pen Inject 30-45 Units into the skin at bedtime as needed (high blood sugar). Per sliding scale 01/30/15   [provider]  mirtazapine (REMERON) 7.5 MG tablet Take 7.5 mg by mouth every evening. 04/29/20   [provider]  Multiple Vitamin (MULTIVITAMIN WITH MINERALS) TABS tablet Take 1 tablet by mouth daily.    [provider]  NOREL AD 4-10-325 MG TABS Take 1 tablet by mouth 2 (two) times daily as needed (pain/cough).  05/05/20   [provider]  omeprazole (PRILOSEC) 20 MG capsule Take 1 capsule (20 mg total) by mouth daily. 06/07/23   Sabas Sous, MD  oxyCODONE-acetaminophen (PERCOCET) 5-325 MG tablet Take 1 tablet by mouth every 6 (six) hours as needed for severe pain. 05/11/20   Rhyne, Ames Coupe, PA-C  rosuvastatin (CRESTOR) 10 MG tablet Take 1 tablet (10 mg total) by mouth at bedtime. 05/02/13   Angiulli, Mcarthur Rossetti, PA-C  tamsulosin (FLOMAX) 0.4 MG CAPS Take 1 capsule (0.4 mg total) by mouth at bedtime. Patient taking differently: Take 0.8 mg by mouth at bedtime.  05/02/13   Angiulli, Mcarthur Rossetti, PA-C  Tetrahydroz-Dextran-PEG-Povid (EYE DROPS ADVANCED RELIEF OP) Place 1 drop into both eyes daily as needed (dry eyes).     [provider]     Vitals:   06/19/23 0945 06/19/23 1000 06/19/23 1015 06/19/23 1030  BP: (!) 136/51 (!) 141/59 134/63 (!) 136/48  Pulse: (!) 59 (!) 56 62 62  Resp: 15 (!) 21 15 16   Temp:      TempSrc:      SpO2: 100% 100% 96% 99%   Exam Gen alert, no distress, [place] O2 No rash, cyanosis or  gangrene Sclera anicteric, throat clear  No jvd or bruits Chest clear bilat to bases, no rales/ wheezing RRR no MRG Abd soft ntnd no mass or ascites +bs GU normal male MS no joint effusions or deformity Ext no LE or UE edema, no wounds or ulcers Neuro is alert, Ox 3 , nf    RUA AVF+bruit   Home meds include - albuterol, allopruinol, amlodipine 10, aspirin, benzonatate, carvedilol 12.5 bid, ceterizine, colchicine prn, dutasteride, fluticasone, furosemide 80 bid, hydralazine 25 bid, insulin aspart/ levemir, mirtazapine,  MVI, norel prn, omeprazole, percocet, rosvastatin, tamsulosin, eye drops    OP HD: - MWF G-O  4h   400/600   95.5kg   2/2.5 bath  AVF  Heparin 3000 - last HD 8/02, post wt 95kg   - venofer 50mg  IV weekly - rocaltrol 0.5 mcg po three times per week - sensipar 30mg  po three times per week    Today --> Na 133 K 4.8  bun 43  creat 7.4   ca 8.4  ast 1185  alt 783   Tbili 1.1  wbc 8K  Hb 10.8     CXR - small effusions at bases vs atx vs other  Assessment/ Plan: Abdominal pain/ ^LFT"S - possible acute cholecystitis, w/u in progress per gen surgery.  ESRD - on HD MWF.  Due for HD today but labs/ volume are stable and pt may need surgery today. Will postpone HD to tomorrow.  HTN/ volume - Bp's stable, no vol overload on exam. As above.  Anemia esrd - Hb 10.8, no esa needs at this time.  MBD ckd - CCa in range, will add on phos.  DM2 - on insulin       Vinson Moselle  MD [facility] 06/19/2023, 11:05 AM  Recent Labs  Lab 06/19/23 0545  HGB 10.8*  ALBUMIN 2.9*  CALCIUM 8.4*  CREATININE 7.43*  K 4.8   Inpatient medications:  famotidine  20 mg Oral Once    piperacillin-tazobactam (ZOSYN)  IV     acetaminophen **OR** acetaminophen, fentaNYL (SUBLIMAZE) injection, ondansetron **OR** ondansetron (ZOFRAN) IV

## 2023-06-19 NOTE — Progress Notes (Signed)
Pt receives out-pt HD at [facility] on MWF. Will assist as needed.     Renal Navigator [phone] 

## 2023-06-19 NOTE — Consult Note (Signed)
Consult Note  [facility] 28-Jan-1932  [id number].    Requesting MD: Alvira Monday, MD Chief Complaint/Reason for Consult: Possible cholecystitis  HPI:  Patient is an 89 year old male with multiple chronic medical conditions including ESRD on HD, who presented to the ED with epigastric abdominal pain for the last 2 weeks. Pain has been constant since onset and has progressively gotten worse. He reports it is exacerbated by eating. Having some bowel function but reports BMs are small and he was having some constipation prior to taking some colace. He was evaluated in the ED 2 weeks ago and was noted to have gallstones but no findings concerning for acute cholecystitis at that time and LFTs were normal. Today, LFTs significantly elevated and while [place] is not convincing for cholecystitis, CT does question possible cholecystitis. Patient lives here and in [place] normally and son was present in the ED with him. His daughter lives in [place] and was on the phone. Patient has not had any prior abdominal surgery. He takes a baby ASA daily but no other blood thinners. He denies ever having similar symptoms previously.   ROS: Negative other than HPI  Family History  Problem Relation Age of Onset   Hypertension Mother    Heart disease Mother    Diabetes Sister     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   Diabetes mellitus    Dyspnea    Dysrhythmia    Glomerulonephritis    Gout    History of pneumonia    History of right bundle branch block (RBBB)    Hypercholesterolemia    Hypertension    Near syncope 10/2011; 11/2011   Pancytopenia    Pneumonia    Renal disorder    Shortness of breath on exertion    Type 2 diabetes mellitus (HCC)     Past Surgical History:  Procedure Laterality Date   AV FISTULA PLACEMENT Right 03/10/2020   Procedure: RIGHT BRACHIOCEPHALIC ARTERIOVENOUS (AV) FISTULA CREATION;   Surgeon: Sherren Kerns, MD;  [facility];  Service: Vascular;  Laterality: Right;   CATARACT EXTRACTION W/ INTRAOCULAR LENS IMPLANT  ~ 2009   right   FISTULA SUPERFICIALIZATION Right 05/11/2020   Procedure: FISTULA REVISION, BRANCH LIGATION AND SUPERFICIALIZATION OF RIGHT UPPER ARM FISTULA;  Surgeon: Cephus Shelling, MD;  Location: [facility] OR;  Service: Vascular;  Laterality: Right;   HYDROCELE EXCISION      Social History:  reports that he does not have a smoking history on file. He has never used smokeless tobacco. He reports that he does not currently use alcohol. He reports that he does not use drugs.  Allergies:  Allergies  Allergen Reactions   Clonidine Derivatives Other (See Comments)    Not known    (Not in a hospital admission)   Blood pressure (!) 136/48, pulse 62, temperature 98.1 F (36.7 C), temperature source Oral, resp. rate 16, SpO2 99%. Physical Exam:  General: pleasant, WD, WN male who is laying in bed in NAD HEENT: head is normocephalic, atraumatic.  Sclera are anicteric.  EOMI.  Ears and nose without any masses or lesions.  Mouth is pink and moist Heart: regular, rate, and rhythm.   Lungs: CTAB, no wheezes, rhonchi, or rales noted.  Respiratory effort nonlabored Abd: soft, mild generalized ttp, positive Murphy sign, ND, +BS, no masses, hernias,  or organomegaly MS: all 4 extremities are symmetrical with no cyanosis, clubbing, or edema. Skin: warm and dry with no masses, lesions, or rashes Neuro: non focal exam  Psych: A&Ox3 with an appropriate affect.   Results for orders placed or performed during the hospital encounter of 06/19/23 (from the past 48 hour(s))  Comprehensive metabolic panel     Status: Abnormal   Collection Time: 06/19/23  5:45 AM  Result Value Ref Range   Sodium 133 (L) 135 - 145 mmol/L   Potassium 4.8 3.5 - 5.1 mmol/L   Chloride 92 (L) 98 - 111 mmol/L   CO2 28 22 - 32 mmol/L   Glucose, Bld 285 (H) 70 - 99 mg/dL    Comment: Glucose  reference range applies only to samples taken after fasting for at least 8 hours.   BUN 43 (H) 8 - 23 mg/dL   Creatinine, Ser 0.98 (H) 0.61 - 1.24 mg/dL   Calcium 8.4 (L) 8.9 - 10.3 mg/dL   Total Protein 7.0 6.5 - 8.1 g/dL   Albumin 2.9 (L) 3.5 - 5.0 g/dL   AST 1,191 (H) 15 - 41 U/L   ALT 783 (H) 0 - 44 U/L   Alkaline Phosphatase 229 (H) 38 - 126 U/L   Total Bilirubin 1.1 0.3 - 1.2 mg/dL   GFR, Estimated 6 (L) >60 mL/min    Comment: (NOTE) Calculated using the CKD-EPI Creatinine Equation (2021)    Anion gap 13 5 - 15    Comment: Performed at [facility], [address], [place], [place] [address]  CBC with Differential     Status: Abnormal   Collection Time: 06/19/23  5:45 AM  Result Value Ref Range   WBC 8.0 4.0 - 10.5 K/uL   RBC 3.69 (L) 4.22 - 5.81 MIL/uL   Hemoglobin 10.8 (L) 13.0 - 17.0 g/dL   HCT 56.2 (L) 13.0 - 86.5 %   MCV 93.8 80.0 - 100.0 fL   MCH 29.3 26.0 - 34.0 pg   MCHC 31.2 30.0 - 36.0 g/dL   RDW 78.4 69.6 - 29.5 %   Platelets 192 150 - 400 K/uL   nRBC 0.0 0.0 - 0.2 %   Neutrophils Relative % 81 %   Neutro Abs 6.5 1.7 - 7.7 K/uL   Lymphocytes Relative 10 %   Lymphs Abs 0.8 0.7 - 4.0 K/uL   Monocytes Relative 7 %   Monocytes Absolute 0.6 0.1 - 1.0 K/uL   Eosinophils Relative 1 %   Eosinophils Absolute 0.1 0.0 - 0.5 K/uL   Basophils Relative 0 %   Basophils Absolute 0.0 0.0 - 0.1 K/uL   Immature Granulocytes 1 %   Abs Immature Granulocytes 0.08 (H) 0.00 - 0.07 K/uL    Comment: Performed at [facility], [address] [place], [place] [address]  Lipase, blood     Status: None   Collection Time: 06/19/23  5:45 AM  Result Value Ref Range   Lipase 44 11 - 51 U/L    Comment: Performed at [facility], [address] [place], [place] [address]  Troponin I (High Sensitivity)     Status: None   Collection Time: 06/19/23  5:45 AM  Result Value Ref Range   Troponin I (High Sensitivity) 15 <18 ng/L    Comment: (NOTE) Elevated high  sensitivity troponin I (hsTnI) values and significant  changes across serial measurements may suggest ACS but many other  chronic and acute conditions are known to  elevate hsTnI results.  Refer to the "Links" section for chest pain algorithms and additional  guidance. Performed at [facility], [address], [place], [place] [address]   Troponin I (High Sensitivity)     Status: None   Collection Time: 06/19/23  7:24 AM  Result Value Ref Range   Troponin I (High Sensitivity) 14 <18 ng/L    Comment: (NOTE) Elevated high sensitivity troponin I (hsTnI) values and significant  changes across serial measurements may suggest ACS but many other  chronic and acute conditions are known to elevate hsTnI results.  Refer to the "Links" section for chest pain algorithms and additional  guidance. Performed at [facility], [address] [place], [place] [address]   Acetaminophen level     Status: Abnormal   Collection Time: 06/19/23  7:46 AM  Result Value Ref Range   Acetaminophen (Tylenol), Serum <10 (L) 10 - 30 ug/mL    Comment: (NOTE) Therapeutic concentrations vary significantly. A range of 10-30 ug/mL  may be an effective concentration for many patients. However, some  are best treated at concentrations outside of this range. Acetaminophen concentrations >150 ug/mL at 4 hours after ingestion  and >50 ug/mL at 12 hours after ingestion are often associated with  toxic reactions.  Performed at [facility], [address], [place], [place] [address]   Protime-INR     Status: None   Collection Time: 06/19/23  7:46 AM  Result Value Ref Range   Prothrombin Time 14.6 11.4 - 15.2 seconds   INR 1.1 0.8 - 1.2    Comment: (NOTE) INR goal varies based on device and disease states. Performed at [facility], [address], [place], [place] [address]    CT ABDOMEN PELVIS W CONTRAST  Result Date: 06/19/2023 CLINICAL DATA:  Bowel obstruction suspected EXAM: CT  ABDOMEN AND PELVIS WITH CONTRAST TECHNIQUE: Multidetector CT imaging of the abdomen and pelvis was performed using the standard protocol following bolus administration of intravenous contrast. RADIATION DOSE REDUCTION: This exam was performed according to the departmental dose-optimization program which includes automated exposure control, adjustment of the mA and/or kV according to patient size and/or use of iterative reconstruction technique. CONTRAST:  75mL OMNIPAQUE IOHEXOL 350 MG/ML SOLN COMPARISON:  06/07/2023 FINDINGS: Lower chest: There are patchy infiltrates seen both lower lung fields. There is interval worsening of infiltrate in right lower lung field. Minimal right pleural effusion is seen. Coronary artery calcifications are seen. Hepatobiliary: There is mild periportal edema. There is gallbladder wall thickening. These small amount of fluid adjacent to the gallbladder. Small ascites is noted in perihepatic region. There is no dilation of bile ducts. Pancreas: No focal abnormalities are seen. Spleen: Unremarkable. Adrenals/Urinary Tract: Adrenals are unremarkable. Both kidneys are smaller than usual incised with cortical atrophy. This finding has not changed. There is no hydronephrosis. There are no renal or ureteral stones. Urinary bladder is unremarkable. Stomach/Bowel: Stomach is not distended. Small bowel loops are not dilated. Appendix is not dilated. Cecum is higher and more medial than usual in position. Multiple diverticula are seen in colon. There are no signs of focal diverticulitis. Vascular/Lymphatic: Calcifications are seen in aorta and its major branches. Reproductive: Unremarkable. Other: There is no pneumoperitoneum. Umbilical hernia containing fat is seen. Musculoskeletal: Schmorl's nodes are seen in multiple vertebrae. Degenerative changes are noted in lumbar spine. No interval changes are noted. IMPRESSION: Gallbladder is distended. There is wall thickening in gallbladder and  pericholecystic fluid. Possibility of  cholecystitis is not excluded. Please correlate with findings in the sonogram done earlier today. There is mild periportal edema which may be due to nonspecific hepatitis. There is small perihepatic ascites. There is no evidence of intestinal obstruction or pneumoperitoneum. There is no hydronephrosis. Appendix is not dilated. Diverticulosis of colon without signs of diverticulitis. Aortic arteriosclerosis. Coronary artery calcifications are seen. Lumbar spondylosis. There are patchy infiltrates in the lower lung fields with interval worsening in right lower lung field suggesting atelectasis/pneumonia. Minimal right pleural effusion. Electronically Signed   By: Ernie Avena M.D.   On: 06/19/2023 09:28   DG Abdomen Acute W/Chest  Result Date: 06/19/2023 CLINICAL DATA:  89 year old male with history of chest pain and abdominal distension. Epigastric pain. EXAM: DG ABDOMEN ACUTE WITH 1 VIEW CHEST COMPARISON:  Chest x-ray 05/19/2020. FINDINGS: Lung volumes are low. Bibasilar opacities are noted, which may reflect areas of atelectasis and/or consolidation. Blunting of the costophrenic sulci bilaterally suggestive of small bilateral pleural effusions. No pneumothorax. No evidence of pulmonary edema. Heart size is normal. Atherosclerotic calcifications are noted in the thoracic aorta. Dedicated views of the abdomen demonstrates some mildly dilated loops of small bowel in the central abdomen measuring up to 3.8 cm in diameter. Paucity of distal colonic gas. No definite pneumoperitoneum. IMPRESSION: 1. Abnormal bowel-gas pattern concerning for early or partial small bowel obstruction. 2. No pneumoperitoneum. 3. Low lung volumes with bibasilar opacities which may reflect areas of atelectasis and/or consolidation, likely with superimposed small bilateral pleural effusions. 4. Aortic atherosclerosis. Electronically Signed   By: Trudie Reed M.D.   On: 06/19/2023 06:30   US  Abdomen Limited  Result Date: 06/19/2023 CLINICAL DATA:  Right upper quadrant pain EXAM: ULTRASOUND ABDOMEN LIMITED RIGHT UPPER QUADRANT COMPARISON:  06/07/2023 abdominal CT FINDINGS: Gallbladder: Sludge with possible stone at the neck measuring 9 mm, although not clearly shadowing. No focal tenderness or wall thickening. Common bile duct: Diameter: 7 mm, normal for age. Liver: No focal lesion identified. Within normal limits in parenchymal echogenicity. Portal vein is patent on color Doppler imaging with normal direction of blood flow towards the liver. IMPRESSION: Gallbladder sludge and equivocal stone in the gallbladder neck. A small gallstone was seen by CT recently. No evidence of cholecystitis. Electronically Signed   By: Tiburcio Pea M.D.   On: 06/19/2023 06:18      Assessment/Plan Abdominal pain Elevated LFTs Probable acute cholecystitis  - RUQ [place] today with gallbladder sludge and possible stone at the gallbladder neck, no evidence of cholecystitis  - CT today with gallbladder wall thickening and pericholecystic fluid (non-specific in ESRD pt), stool throughout the colon  - no leukocytosis and pt is afebrile and HD stable  - he is ttp on abdominal exam with positive Murphy sign - LFTs were normal 2 weeks ago, today Tbili is 1.1, Alk Phos 229, AST/ALT 1185/783 - GI consulted, hepatitis panel pending  - Exam and hx concerning for acute cholecystitis - will discuss with MD, may get HIDA to confirm vs planning lap chole this admission if hepatitis panel negative   FEN: NPO, IVF per [facility]/renal VTE: SCDs ID: [email or web address]  - per [facility] -  ESRD on HD MWF HTN HLD T2DM Hx of RBBB - last ECHO in 05/2023 with grade I diastolic dysfunction and EF 60-65% DOE Gout Anemia of CKD  I reviewed ED provider notes, Consultant GI notes, last 24 h vitals and pain scores, last 48 h intake and output, last 24 h labs and trends, and last 24 h  imaging results.   Juliet Rude, [facility]  Surgery 06/19/2023, 10:45 AM Please see Amion for pager number during day hours 7:00am-4:30pm

## 2023-06-19 NOTE — ED Provider Notes (Signed)
  Physical Exam  BP (!) 143/58   Pulse 63   Temp 98.1 F (36.7 C) (Oral)   Resp 17   SpO2 99%   Physical Exam  Procedures  Procedures  ED Course / MDM   Clinical Course as of 06/19/23 0936  Mon Jun 19, 2023  4098 ESRD MWF. Epigastric pain, distended abdomen, RUQ tenderness, stone on CT 2w ago, acute on chronic pain. Labwork pending. Anticipate elevated LFT's, anticipate hepatobiliary pathology. Will need CT [WL]    Clinical Course User Index [WL] Dyanne Iha, MD    Received care of patient from Ananias Pilgrim, PA-C and Dr. Jacqulyn Bath.  Please see their note for history, physical and care.  Patient is a 89-year-old male with history of ESRD on dialysis [facility] Wednesday Friday who is here in [place] for 2 months from [place], who presents with concern for epigastric and right upper quadrant abdominal pain.  He has had intermittent pain over the past, including a visit to the emergency department in July  Today, reviewed his labs and imaging including ultrasound and CT abdomen pelvis which show findings concerning for possible cholecystitis, hepatitis.  No evidence of intestinal obstruction, no sign of common bile duct dilation by ultrasound or CT.  Consulted surgery regarding concern for possible cholecystitis by history, exam, CT findings of inflammation, cystic duct stone on ultrasound.  Spoke with [employer]  Consulted gastroenterology, Margaretann Loveless, PA-C regarding his significant transaminitis, concern for hepatitis.  He was given empiric Zosyn.  Acetaminophen level is negative, hepatitis panel and COVID testing are pending.  His INR is normal.  Plan for admission to the hospital to the hospitalist service with consultations from general surgery, gastroenterology, and nephrology Dr. Bethanne Ginger he is due for his regular dialysis.     Ryan Monday, MD 06/19/23 1010

## 2023-06-19 NOTE — ED Notes (Signed)
ED TO INPATIENT HANDOFF REPORT  ED Nurse Name and Phone #: , RN [phone]  S Name/Age/Gender Ryan Jimenez 89 y.o. male Room/Bed: 045C/045C  Code Status   Code Status: Full Code  Home/SNF/Other Home Patient oriented to: self, place, time, and situation Is this baseline? Yes   Triage Complete: Triage complete  Chief Complaint Acute cholecystitis [K81.0]  Triage Note Pt comes in with epigastric pain. Patient states it feels like he has something stuck in his throat. He also tries to vomit to get it up. His vitals are stable, and his CBG was in the 300s.     Allergies Allergies  Allergen Reactions   Clonidine Derivatives Other (See Comments)    Not known    Level of Care/Admitting Diagnosis ED Disposition     ED Disposition  Admit   Condition  --   Comment  Hospital Area: [facility] [[id number]]  Level of Care: Progressive [102]  Admit to Progressive based on following criteria: MULTISYSTEM THREATS such as stable sepsis, metabolic/electrolyte imbalance with or without encephalopathy that is responding to early treatment.  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   Diabetes mellitus    Dyspnea    Dysrhythmia    Glomerulonephritis    Gout    History of pneumonia    History of right bundle branch block (RBBB)    Hypercholesterolemia    Hypertension    Near syncope  10/2011; 11/2011   Pancytopenia    Pneumonia    Renal disorder    Shortness of breath on exertion    Type 2 diabetes mellitus (HCC)    Past Surgical History:  Procedure Laterality Date   AV FISTULA PLACEMENT Right 03/10/2020   Procedure: RIGHT BRACHIOCEPHALIC ARTERIOVENOUS (AV) FISTULA CREATION;  Surgeon: Sherren Kerns, MD;  Location: [facility] OR;  Service: Vascular;  Laterality: Right;   CATARACT EXTRACTION W/ INTRAOCULAR LENS IMPLANT  ~ 2009   right   FISTULA SUPERFICIALIZATION Right 05/11/2020   Procedure: FISTULA REVISION, BRANCH LIGATION AND SUPERFICIALIZATION OF RIGHT UPPER ARM FISTULA;  Surgeon: Cephus Shelling, MD;  Location: [facility] OR;  Service: Vascular;  Laterality: Right;   HYDROCELE EXCISION       A IV Location/Drains/Wounds Patient Lines/Drains/Airways Status     Active Line/Drains/Airways     Name Placement date Placement time Site Days   Peripheral IV 06/19/23 20 G Anterior;Left;Proximal Forearm 06/19/23  0734  Forearm  less than 1   Fistula / Graft Right Upper arm Arteriovenous fistula 03/10/20  0834  Upper arm  1196            Intake/Output Last 24 hours No intake or output data in the 24 hours ending 06/19/23 1139  Labs/Imaging Results for orders placed or performed during the hospital encounter of 06/19/23 (from the past 48 hour(s))  Comprehensive metabolic panel     Status: Abnormal  Collection Time: 06/19/23  5:45 AM  Result Value Ref Range   Sodium 133 (L) 135 - 145 mmol/L   Potassium 4.8 3.5 - 5.1 mmol/L   Chloride 92 (L) 98 - 111 mmol/L   CO2 28 22 - 32 mmol/L   Glucose, Bld 285 (H) 70 - 99 mg/dL    Comment: Glucose reference range applies only to samples taken after fasting for at least 8 hours.   BUN 43 (H) 8 - 23 mg/dL   Creatinine, Ser 1.61 (H) 0.61 - 1.24 mg/dL   Calcium 8.4 (L) 8.9 - 10.3 mg/dL   Total Protein 7.0 6.5 - 8.1 g/dL   Albumin 2.9 (L) 3.5 - 5.0 g/dL   AST 0,960 (H) 15 - 41 U/L   ALT 783 (H) 0 - 44 U/L   Alkaline  Phosphatase 229 (H) 38 - 126 U/L   Total Bilirubin 1.1 0.3 - 1.2 mg/dL   GFR, Estimated 6 (L) >60 mL/min    Comment: (NOTE) Calculated using the CKD-EPI Creatinine Equation (2021)    Anion gap 13 5 - 15    Comment: Performed at [facility], [address], [place], [place] [address]  CBC with Differential     Status: Abnormal   Collection Time: 06/19/23  5:45 AM  Result Value Ref Range   WBC 8.0 4.0 - 10.5 K/uL   RBC 3.69 (L) 4.22 - 5.81 MIL/uL   Hemoglobin 10.8 (L) 13.0 - 17.0 g/dL   HCT 81.1 (L) 91.4 - 78.2 %   MCV 93.8 80.0 - 100.0 fL   MCH 29.3 26.0 - 34.0 pg   MCHC 31.2 30.0 - 36.0 g/dL   RDW 95.6 21.3 - 08.6 %   Platelets 192 150 - 400 K/uL   nRBC 0.0 0.0 - 0.2 %   Neutrophils Relative % 81 %   Neutro Abs 6.5 1.7 - 7.7 K/uL   Lymphocytes Relative 10 %   Lymphs Abs 0.8 0.7 - 4.0 K/uL   Monocytes Relative 7 %   Monocytes Absolute 0.6 0.1 - 1.0 K/uL   Eosinophils Relative 1 %   Eosinophils Absolute 0.1 0.0 - 0.5 K/uL   Basophils Relative 0 %   Basophils Absolute 0.0 0.0 - 0.1 K/uL   Immature Granulocytes 1 %   Abs Immature Granulocytes 0.08 (H) 0.00 - 0.07 K/uL    Comment: Performed at [facility], [address], [place], [place] [address]  Lipase, blood     Status: None   Collection Time: 06/19/23  5:45 AM  Result Value Ref Range   Lipase 44 11 - 51 U/L    Comment: Performed at [facility], [address] [place], [place] [address]  Troponin I (High Sensitivity)     Status: None   Collection Time: 06/19/23  5:45 AM  Result Value Ref Range   Troponin I (High Sensitivity) 15 <18 ng/L    Comment: (NOTE) Elevated high sensitivity troponin I (hsTnI) values and significant  changes across serial measurements may suggest ACS but many other  chronic and acute conditions are known to elevate hsTnI results.  Refer to the "Links" section for chest pain algorithms and additional  guidance. Performed at [facility], [address],  [place], [place] [address]   Troponin I (High Sensitivity)     Status: None   Collection Time: 06/19/23  7:24 AM  Result Value Ref Range   Troponin I (High Sensitivity) 14 <18 ng/L  Comment: (NOTE) Elevated high sensitivity troponin I (hsTnI) values and significant  changes across serial measurements may suggest ACS but many other  chronic and acute conditions are known to elevate hsTnI results.  Refer to the "Links" section for chest pain algorithms and additional  guidance. Performed at [facility], [address], [place], [place] [address]   Acetaminophen level     Status: Abnormal   Collection Time: 06/19/23  7:46 AM  Result Value Ref Range   Acetaminophen (Tylenol), Serum <10 (L) 10 - 30 ug/mL    Comment: (NOTE) Therapeutic concentrations vary significantly. A range of 10-30 ug/mL  may be an effective concentration for many patients. However, some  are best treated at concentrations outside of this range. Acetaminophen concentrations >150 ug/mL at 4 hours after ingestion  and >50 ug/mL at 12 hours after ingestion are often associated with  toxic reactions.  Performed at [facility], [address] [place], [place] [address]   Protime-INR     Status: None   Collection Time: 06/19/23  7:46 AM  Result Value Ref Range   Prothrombin Time 14.6 11.4 - 15.2 seconds   INR 1.1 0.8 - 1.2    Comment: (NOTE) INR goal varies based on device and disease states. Performed at [facility], [address] [place], [place] [address]    CT ABDOMEN PELVIS W CONTRAST  Result Date: 06/19/2023 CLINICAL DATA:  Bowel obstruction suspected EXAM: CT ABDOMEN AND PELVIS WITH CONTRAST TECHNIQUE: Multidetector CT imaging of the abdomen and pelvis was performed using the standard protocol following bolus administration of intravenous contrast. RADIATION DOSE REDUCTION: This exam was performed according to the departmental dose-optimization program which includes automated exposure  control, adjustment of the mA and/or kV according to patient size and/or use of iterative reconstruction technique. CONTRAST:  75mL OMNIPAQUE IOHEXOL 350 MG/ML SOLN COMPARISON:  06/07/2023 FINDINGS: Lower chest: There are patchy infiltrates seen both lower lung fields. There is interval worsening of infiltrate in right lower lung field. Minimal right pleural effusion is seen. Coronary artery calcifications are seen. Hepatobiliary: There is mild periportal edema. There is gallbladder wall thickening. These small amount of fluid adjacent to the gallbladder. Small ascites is noted in perihepatic region. There is no dilation of bile ducts. Pancreas: No focal abnormalities are seen. Spleen: Unremarkable. Adrenals/Urinary Tract: Adrenals are unremarkable. Both kidneys are smaller than usual incised with cortical atrophy. This finding has not changed. There is no hydronephrosis. There are no renal or ureteral stones. Urinary bladder is unremarkable. Stomach/Bowel: Stomach is not distended. Small bowel loops are not dilated. Appendix is not dilated. Cecum is higher and more medial than usual in position. Multiple diverticula are seen in colon. There are no signs of focal diverticulitis. Vascular/Lymphatic: Calcifications are seen in aorta and its major branches. Reproductive: Unremarkable. Other: There is no pneumoperitoneum. Umbilical hernia containing fat is seen. Musculoskeletal: Schmorl's nodes are seen in multiple vertebrae. Degenerative changes are noted in lumbar spine. No interval changes are noted. IMPRESSION: Gallbladder is distended. There is wall thickening in gallbladder and pericholecystic fluid. Possibility of cholecystitis is not excluded. Please correlate with findings in the sonogram done earlier today. There is mild periportal edema which may be due to nonspecific hepatitis. There is small perihepatic ascites. There is no evidence of intestinal obstruction or pneumoperitoneum. There is no hydronephrosis.  Appendix is not dilated. Diverticulosis of colon without signs of diverticulitis. Aortic arteriosclerosis. Coronary artery calcifications are seen. Lumbar spondylosis. There are patchy  infiltrates in the lower lung fields with interval worsening in right lower lung field suggesting atelectasis/pneumonia. Minimal right pleural effusion. Electronically Signed   By: Ernie Avena M.D.   On: 06/19/2023 09:28   DG Abdomen Acute W/Chest  Result Date: 06/19/2023 CLINICAL DATA:  89 year old male with history of chest pain and abdominal distension. Epigastric pain. EXAM: DG ABDOMEN ACUTE WITH 1 VIEW CHEST COMPARISON:  Chest x-ray 05/19/2020. FINDINGS: Lung volumes are low. Bibasilar opacities are noted, which may reflect areas of atelectasis and/or consolidation. Blunting of the costophrenic sulci bilaterally suggestive of small bilateral pleural effusions. No pneumothorax. No evidence of pulmonary edema. Heart size is normal. Atherosclerotic calcifications are noted in the thoracic aorta. Dedicated views of the abdomen demonstrates some mildly dilated loops of small bowel in the central abdomen measuring up to 3.8 cm in diameter. Paucity of distal colonic gas. No definite pneumoperitoneum. IMPRESSION: 1. Abnormal bowel-gas pattern concerning for early or partial small bowel obstruction. 2. No pneumoperitoneum. 3. Low lung volumes with bibasilar opacities which may reflect areas of atelectasis and/or consolidation, likely with superimposed small bilateral pleural effusions. 4. Aortic atherosclerosis. Electronically Signed   By: Trudie Reed M.D.   On: 06/19/2023 06:30   US Abdomen Limited  Result Date: 06/19/2023 CLINICAL DATA:  Right upper quadrant pain EXAM: ULTRASOUND ABDOMEN LIMITED RIGHT UPPER QUADRANT COMPARISON:  06/07/2023 abdominal CT FINDINGS: Gallbladder: Sludge with possible stone at the neck measuring 9 mm, although not clearly shadowing. No focal tenderness or wall thickening. Common bile duct:  Diameter: 7 mm, normal for age. Liver: No focal lesion identified. Within normal limits in parenchymal echogenicity. Portal vein is patent on color Doppler imaging with normal direction of blood flow towards the liver. IMPRESSION: Gallbladder sludge and equivocal stone in the gallbladder neck. A small gallstone was seen by CT recently. No evidence of cholecystitis. Electronically Signed   By: Tiburcio Pea M.D.   On: 06/19/2023 06:18    Pending Labs Unresulted Labs (From admission, onward)     Start     Ordered   06/20/23 0500  CBC  Tomorrow morning,   R        06/19/23 1006   06/20/23 0500  Comprehensive metabolic panel  Tomorrow morning,   R        06/19/23 1006   06/19/23 1121  Hepatitis B surface antigen  (New Admission [facility] (Hepatitis B))  Once,   R        06/19/23 1127   06/19/23 1121  Hepatitis B surface antibody,quantitative  (New Admission [facility] (Hepatitis B))  Once,   R        06/19/23 1127   06/19/23 1120  Phosphorus  Add-on,   AD        06/19/23 1119   06/19/23 0707  Resp panel by RT-PCR (RSV, Flu A&B, Covid) Anterior Nasal Swab  Once,   URGENT        06/19/23 0706   06/19/23 0707  Hepatitis panel, acute  Once,   URGENT        06/19/23 0706            Vitals/Pain Today's Vitals   06/19/23 1000 06/19/23 1015 06/19/23 1030 06/19/23 1106  BP: (!) 141/59 134/63 (!) 136/48   Pulse: (!) 56 62 62   Resp: (!) 21 15 16    Temp:      TempSrc:      SpO2: 100% 96% 99%   PainSc:    8  Isolation Precautions No active isolations  Medications Medications  famotidine (PEPCID) tablet 20 mg ( Oral Canceled Entry 06/19/23 0802)  fentaNYL (SUBLIMAZE) injection 25 mcg (25 mcg Intravenous Given 06/19/23 1009)  piperacillin-tazobactam (ZOSYN) IVPB 2.25 g (has no administration in time range)  acetaminophen (TYLENOL) tablet 650 mg (has no administration in time range)    Or  acetaminophen (TYLENOL) suppository 650 mg (has no administration in time range)  ondansetron  (ZOFRAN) tablet 4 mg (has no administration in time range)    Or  ondansetron (ZOFRAN) injection 4 mg (has no administration in time range)  Chlorhexidine Gluconate Cloth 2 % PADS 6 each (has no administration in time range)  ondansetron (ZOFRAN) injection 4 mg (4 mg Intravenous Given 06/19/23 0800)  HYDROmorphone (DILAUDID) injection 0.5 mg (0.5 mg Intravenous Given 06/19/23 0800)  piperacillin-tazobactam (ZOSYN) IVPB 3.375 g (0 g Intravenous Stopped 06/19/23 0941)  iohexol (OMNIPAQUE) 350 MG/ML injection 75 mL (75 mLs Intravenous Contrast Given 06/19/23 0850)    Mobility walks     Focused Assessments    R Recommendations: See Admitting Provider Note  Report given to:   Additional Notes: Patient comes from home, needs dialysis, not walking d/t shob, on 3L for now, main complaint is the abd pain.

## 2023-06-19 NOTE — ED Triage Notes (Signed)
Pt comes in with epigastric pain. Patient states it feels like he has something stuck in his throat. He also tries to vomit to get it up. His vitals are stable, and his CBG was in the 300s.

## 2023-06-19 NOTE — Progress Notes (Signed)
Echocardiogram 2D Echocardiogram has been performed.  Irish Lack, RDCS 06/19/2023, 4:35 PM

## 2023-06-19 NOTE — H&P (Signed)
History and Physical    Patient: Ryan Jimenez:[id number] DOB: [birth year] DOA: 06/19/2023 DOS: the patient was seen and examined on 06/19/2023 PCP: Fleet Contras, MD  Patient coming from: Home  Chief Complaint:  Chief Complaint  Patient presents with   Abdominal Pain   HPI: Ryan Jimenez is a 89 y.o. male with medical history significant of ESRD on dialysis, anemia renal disease, osteoarthritis, bronchitis, type 2 diabetes, dyspnea, dysrhythmia, gout, history of pneumonia, chronic bronchitis, RBBB, hyperlipidemia, hypertension, near syncope, pancytopenia who is coming to the emergency department complaints of abdominal pain and feeling like there is something blocking his throat.  He has had multiple episodes of vomiting trying to vomit this up. He has been having epigastric pain for the past 2 weeks that get worse and with food intake.   No abdominal pain, nausea, emesis, diarrhea, constipation, melena or hematochezia.He denied fever, chills, rhinorrhea, wheezing or hemoptysis.  No chest pain, palpitations, diaphoresis, PND, orthopnea, but occasionally has pitting edema of the lower extremities.  He is still urinates a small amount sometimes.  No flank pain, dysuria, frequency or hematuria.  No polyuria, polydipsia, polyphagia or blurred vision.   Lab work: His CBCs are white count of 8.0, hemoglobin 10.8 g/dL and platelets 956.  Normal PT and INR.  Troponin x 2, lipase and acetaminophen levels are normal.  CMP showed a BUN of 43 and creatinine 7.43 mg/dL.  Albumin 2.9 g/dL, AST 2130, ALT 865 and alkaline phosphatase 229 units/L.  Total protein, bilirubin and electrolytes were normal after sodium/calcium correction.  Imaging: RUQ ultrasound showed gallbladder sludge and acute focal stone in the gallbladder neck.  Small gallstone was seen recently on CT.  No evidence of cholecystitis.  Abdominal x-ray with abnormal bowel gas pattern concerning for early or partial SBO.  No pneumoperitoneum.   Low lung volumes with bibasilar opacities which may reflect areas of atelectasis and/or consolidation, likely with superimposed close small bilateral pleural effusions.  Aortic atherosclerosis.  CT abdomen/pelvis showing distended gallbladder with wall thickening and pericholecystic fluid.  Possibility of cholecystitis not excluded.  Correlate with findings with sonogram done earlier today.  Mild periportal edema which may be due to nonspecific hepatitis.  Small perihepatic ascites.  Diverticulosis without diverticulitis.  Patchy infiltrates in the lower lung fields with worsening in the right lower lung field suggesting atelectasis/pneumonia.  Minimal right pleural effusion.  Please see images and full radiology report for further details.   ED course: Initial vital signs were temperature 98.1 F, pulse 65, respiration 27, BP 164/71 mmHg O2 sat 92% on room air.  The patient received hydromorphone 0.5 mg IVP x 1, but developed hypoxia after this.  He also repeat received famotidine 20 mg IVP, ondansetron 4 mg IVP and Zosyn 3.375 g IVPB.  Review of Systems: As mentioned in the history of present illness. All other systems reviewed and are negative. 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   Diabetes mellitus    Dyspnea    Dysrhythmia    Glomerulonephritis    Gout    History of pneumonia    History of right bundle branch block (RBBB)    Hypercholesterolemia    Hypertension    Near syncope 10/2011; 11/2011   Pancytopenia    Pneumonia    Renal disorder    Shortness of breath on exertion    Type 2  diabetes mellitus [facility])    Past Surgical History:  Procedure Laterality Date   AV FISTULA PLACEMENT Right 03/10/2020   Procedure: RIGHT BRACHIOCEPHALIC ARTERIOVENOUS (AV) FISTULA CREATION;  Surgeon: Sherren Kerns, MD;  Location: [facility] OR;  Service: Vascular;  Laterality: Right;   CATARACT EXTRACTION W/ INTRAOCULAR LENS  IMPLANT  ~ 2009   right   FISTULA SUPERFICIALIZATION Right 05/11/2020   Procedure: FISTULA REVISION, BRANCH LIGATION AND SUPERFICIALIZATION OF RIGHT UPPER ARM FISTULA;  Surgeon: Cephus Shelling, MD;  Location: [facility] OR;  Service: Vascular;  Laterality: Right;   HYDROCELE EXCISION     Social History:  reports that he does not have a smoking history on file. He has never used smokeless tobacco. He reports that he does not currently use alcohol. He reports that he does not use drugs.  Allergies  Allergen Reactions   Clonidine Derivatives Other (See Comments)    Not known    Family History  Problem Relation Age of Onset   Hypertension Mother    Heart disease Mother    Diabetes Sister     Prior to Admission medications   Medication Sig Start Date End Date Taking? Authorizing Provider  albuterol (PROVENTIL) (2.5 MG/3ML) 0.083% nebulizer solution Take 3 mLs (2.5 mg total) by nebulization every 6 (six) hours as needed for wheezing or shortness of breath. 05/20/20 06/19/20  Lanae Boast, MD  albuterol (VENTOLIN HFA) 108 (90 Base) MCG/ACT inhaler Inhale 2 puffs into the lungs every 6 (six) hours as needed (wheezing/cough). 05/20/20   Lanae Boast, MD  allopurinol (ZYLOPRIM) 100 MG tablet Take 100 mg by mouth daily.    [provider]  amLODipine (NORVASC) 10 MG tablet Take 10 mg by mouth daily.    [provider]  aspirin EC 81 MG tablet Take 81 mg by mouth daily.    [provider]  benzonatate (TESSALON) 100 MG capsule Take 200 mg by mouth every 8 (eight) hours as needed for cough.  04/15/20   [provider]  carvedilol (COREG) 12.5 MG tablet Take 1 tablet (12.5 mg total) by mouth 2 (two) times daily with a meal. Patient taking differently: Take 12.5 mg by mouth 2 (two) times daily with a meal. 10am and 10pm 05/02/13   Angiulli, Mcarthur Rossetti, PA-C  cetirizine (ZYRTEC) 10 MG tablet Take 10 mg by mouth daily as needed (cough).    [provider]  colchicine 0.6 MG  tablet Take 0.6 mg by mouth daily as needed (gout).     [provider]  dutasteride (AVODART) 0.5 MG capsule Take 0.5 mg by mouth daily.    [provider]  fluticasone (FLONASE) 50 MCG/ACT nasal spray Place 2 sprays into both nostrils daily. Patient taking differently: Place 2 sprays into both nostrils at bedtime.  05/29/18   Cathie Hoops, Amy V, PA-C  furosemide (LASIX) 80 MG tablet Take 1 tablet (80 mg total) by mouth 2 (two) times daily. 05/20/20 06/19/20  Lanae Boast, MD  hydrALAZINE (APRESOLINE) 25 MG tablet Take 25 mg by mouth 2 (two) times daily. 03/02/20   [provider]  insulin aspart (NOVOLOG) 100 UNIT/ML injection Inject 4-10 Units into the skin 3 (three) times daily as needed for high blood sugar.     [provider]  LEVEMIR FLEXTOUCH 100 UNIT/ML Pen Inject 30-45 Units into the skin at bedtime as needed (high blood sugar). Per sliding scale 01/30/15   [provider]  mirtazapine (REMERON) 7.5 MG tablet Take 7.5 mg by mouth every  evening. 04/29/20   [provider]  Multiple Vitamin (MULTIVITAMIN WITH MINERALS) TABS tablet Take 1 tablet by mouth daily.    [provider]  NOREL AD 4-10-325 MG TABS Take 1 tablet by mouth 2 (two) times daily as needed (pain/cough).  05/05/20   [provider]  omeprazole (PRILOSEC) 20 MG capsule Take 1 capsule (20 mg total) by mouth daily. 06/07/23   Sabas Sous, MD  oxyCODONE-acetaminophen (PERCOCET) 5-325 MG tablet Take 1 tablet by mouth every 6 (six) hours as needed for severe pain. 05/11/20   Rhyne, Ames Coupe, PA-C  rosuvastatin (CRESTOR) 10 MG tablet Take 1 tablet (10 mg total) by mouth at bedtime. 05/02/13   Angiulli, Mcarthur Rossetti, PA-C  tamsulosin (FLOMAX) 0.4 MG CAPS Take 1 capsule (0.4 mg total) by mouth at bedtime. Patient taking differently: Take 0.8 mg by mouth at bedtime.  05/02/13   Angiulli, Mcarthur Rossetti, PA-C  Tetrahydroz-Dextran-PEG-Povid (EYE DROPS ADVANCED RELIEF OP) Place 1 drop into both  eyes daily as needed (dry eyes).     [provider]    Physical Exam: Vitals:   06/19/23 2595 06/19/23 0808 06/19/23 0808 06/19/23 0815  BP:      Pulse:  63  63  Resp:  15  17  Temp: 98.1 F (36.7 C)     TempSrc: Oral     SpO2:  (!) 83% (S) (!) 83% 99%   Physical Exam Vitals and nursing note reviewed.  Constitutional:      General: He is awake. He is not in acute distress.    Appearance: He is well-developed. He is obese.  HENT:     Head: Normocephalic.     Nose: No rhinorrhea.     Mouth/Throat:     Mouth: Mucous membranes are moist.  Eyes:     General: No scleral icterus.    Pupils: Pupils are equal, round, and reactive to light.  Neck:     Vascular: No JVD.  Cardiovascular:     Rate and Rhythm: Normal rate.     Heart sounds: S1 normal and S2 normal.  Pulmonary:     Effort: Pulmonary effort is normal.  Chest:     Chest wall: No tenderness.  Abdominal:     Palpations: Abdomen is soft.     Tenderness: There is abdominal tenderness in the right upper quadrant. There is no right CVA tenderness, left CVA tenderness, guarding or rebound.  Musculoskeletal:     Cervical back: Neck supple.     Right lower leg: No edema.     Left lower leg: No edema.  Skin:    General: Skin is warm and dry.  Neurological:     General: No focal deficit present.     Mental Status: He is alert and oriented to person, place, and time.  Psychiatric:        Mood and Affect: Mood normal.        Behavior: Behavior normal. Behavior is cooperative.     Data Reviewed:  Results are pending, will review when available.  05/17/2020 transthoracic echocardiogram IMPRESSIONS:   1. Definity used; normal LV systolic function; grade 1 diastolic  dysfunction.   2. Left ventricular ejection fraction, by estimation, is 60 to 65%. The  left ventricle has normal function. The left ventricle has no regional  wall motion abnormalities. Left ventricular diastolic parameters are  consistent with  Grade I diastolic  dysfunction (impaired relaxation).   3. Right ventricular systolic function is normal. The right  ventricular  size is normal.   4. The mitral valve is normal in structure. Trivial mitral valve  regurgitation. No evidence of mitral stenosis.   5. The aortic valve is tricuspid. Aortic valve regurgitation is not  visualized. Mild aortic valve sclerosis is present, with no evidence of  aortic valve stenosis.   6. The inferior vena cava is dilated in size with <50% respiratory  variability, suggesting right atrial pressure of 15 mmHg.   EKG: Vent. rate 70 BPM PR interval 196 ms QRS duration 138 ms QT/QTcB 454/490 ms P-R-T axes 58 75 71 Sinus rhythm Right bundle branch block  Assessment and Plan: Principal Problem:   Acute cholecystitis Observation/MedSurg. Continue IV fluids. Keep n.p.o. for now. Analgesics as needed. Antiemetics as needed. Continue Zosyn every 8 hours. Pantoprazole 40 mg IVP daily. Follow CBC, CMP and lipase in AM. General surgery consult appreciated. -Will follow the recommendations. -HIDA scan has been planned. -Unfortunately, patient has not tolerated pain, just medicated with fentanyl.  Active Problems:   Hypercholesterolemia Hold rosuvastatin due to transaminitis.    Hypertension Parenteral antihypertensives for now. -Metoprolol 5 mg IVP every 12 hours. -Hydralazine 10 mg IVP every 4 hours as needed.    ESRD on hemodialysis [facility]) Nephrology consult and orders appreciated. -Will follow the recommendations.    Anemia associated with chronic renal failure Monitor hematocrit and hemoglobin. Transfuse as needed. Erythropoietin dosing per nephrology.    Moderate protein malnutrition (HCC) Protein supplementation. Consider nutritional services evaluation.    Type 2 diabetes mellitus with hyperglycemia (HCC) Currently NPO. CBG monitoring every 6 hours with RI SS.    Gout Resume allopurinol once cleared for oral intake.     Chronic bronchitis (HCC) Short acting bronchodilators as needed.     Advance Care Planning:   Code Status: Full Code   Consults: Nephrology Delano Metz, MD), [facility] surgery.  Family Communication:   Severity of Illness: The appropriate patient status for this patient is INPATIENT. Inpatient status is judged to be reasonable and necessary in order to provide the required intensity of service to ensure the patient's safety. The patient's presenting symptoms, physical exam findings, and initial radiographic and laboratory data in the context of their chronic comorbidities is felt to place them at high risk for further clinical deterioration. Furthermore, it is not anticipated that the patient will be medically stable for discharge from the hospital within 2 midnights of admission.   * I certify that at the point of admission it is my clinical judgment that the patient will require inpatient hospital care spanning beyond 2 midnights from the point of admission due to high intensity of service, high risk for further deterioration and high frequency of surveillance required.*  Author: Bobette Mo, MD 06/19/2023 9:58 AM  For on call review [email or web address].   This document was prepared using Dragon voice recognition software and may contain some unintended transcription errors.

## 2023-06-19 NOTE — Progress Notes (Signed)
ED Pharmacy Antibiotic Sign Off An antibiotic consult was received from an ED provider for possible intra-abdominal infection per pharmacy dosing for zosyn. A chart review was completed to assess appropriateness.   The following one time order(s) were placed:  Zosyn 3.375g IV x 1 over 30 min  Further antibiotic and/or antibiotic pharmacy consults should be ordered by the admitting provider if indicated.   Thank you for allowing pharmacy to be a part of this patient's care.   Daylene Posey, [facility]  Clinical Pharmacist 06/19/23 7:54 AM

## 2023-06-19 NOTE — ED Provider Notes (Signed)
[place] EMERGENCY DEPARTMENT AT [facility] Provider Note   CSN: [id number] Arrival date & time: 06/19/23  0446     History  Chief Complaint  Patient presents with   Abdominal Pain    Ryan Jimenez is a 89 y.o. male.  HPI   Patient with significant medical history presenting with hypertension, diabetes, ESRD, Monday Wednesday Friday, presenting with complaints of abdominal pain, states it is in his epigastric region, states it is constant, describes as a pressure-like sensation, worsened after p.o. intake, associated nausea without any vomiting, still passing gas having normal bowel movements, denies any bloody stools or dark tarry stools, no associated fevers chills cough congestion, states that he has had this pain about 2 weeks ago and states it never really went away.  States that he has been compliant with his dialysis treatments, no associated shortness of breath, does he has some slight chest pain, but only occurs when he has a stomach pain and after he eats and drinks, denies any worsening leg swelling.  Reviewed patient's chart was seen 2 weeks ago for some presentation, lab workup and imaging was unremarkable and he was later discharged home.  Home Medications Prior to Admission medications   Medication Sig Start Date End Date Taking? Authorizing Provider  albuterol (PROVENTIL) (2.5 MG/3ML) 0.083% nebulizer solution Take 3 mLs (2.5 mg total) by nebulization every 6 (six) hours as needed for wheezing or shortness of breath. 05/20/20 06/19/20  Lanae Boast, MD  albuterol (VENTOLIN HFA) 108 (90 Base) MCG/ACT inhaler Inhale 2 puffs into the lungs every 6 (six) hours as needed (wheezing/cough). 05/20/20   Lanae Boast, MD  allopurinol (ZYLOPRIM) 100 MG tablet Take 100 mg by mouth daily.    [provider]  amLODipine (NORVASC) 10 MG tablet Take 10 mg by mouth daily.    [provider]  aspirin EC 81 MG tablet Take 81 mg by mouth daily.    [provider]  benzonatate (TESSALON) 100 MG capsule Take 200 mg by mouth every 8 (eight) hours as needed for cough.  04/15/20   [provider]  carvedilol (COREG) 12.5 MG tablet Take 1 tablet (12.5 mg total) by mouth 2 (two) times daily with a meal. Patient taking differently: Take 12.5 mg by mouth 2 (two) times daily with a meal. 10am and 10pm 05/02/13   Angiulli, Mcarthur Rossetti, PA-C  cetirizine (ZYRTEC) 10 MG tablet Take 10 mg by mouth daily as needed (cough).    [provider]  colchicine 0.6 MG tablet Take 0.6 mg by mouth daily as needed (gout).     [provider]  dutasteride (AVODART) 0.5 MG capsule Take 0.5 mg by mouth daily.    [provider]  fluticasone (FLONASE) 50 MCG/ACT nasal spray Place 2 sprays into both nostrils daily. Patient taking differently: Place 2 sprays into both nostrils at bedtime.  05/29/18   Cathie Hoops, Amy V, PA-C  furosemide (LASIX) 80 MG tablet Take 1 tablet (80 mg total) by mouth 2 (two) times daily. 05/20/20 06/19/20  Lanae Boast, MD  hydrALAZINE (APRESOLINE) 25 MG tablet Take 25 mg by mouth 2 (two) times daily. 03/02/20   [provider]  insulin aspart (NOVOLOG) 100 UNIT/ML injection Inject 4-10 Units into the skin 3 (three) times daily as needed for high blood sugar.     [provider]  LEVEMIR FLEXTOUCH 100 UNIT/ML Pen Inject 30-45 Units into the skin at bedtime as needed (high blood sugar). Per sliding scale 01/30/15  [provider]  mirtazapine (REMERON) 7.5 MG tablet Take 7.5 mg by mouth every evening. 04/29/20   [provider]  Multiple Vitamin (MULTIVITAMIN WITH MINERALS) TABS tablet Take 1 tablet by mouth daily.    [provider]  NOREL AD 4-10-325 MG TABS Take 1 tablet by mouth 2 (two) times daily as needed (pain/cough).  05/05/20   [provider]  omeprazole (PRILOSEC) 20 MG capsule Take 1 capsule (20 mg total) by mouth daily. 06/07/23   Sabas Sous, MD   oxyCODONE-acetaminophen (PERCOCET) 5-325 MG tablet Take 1 tablet by mouth every 6 (six) hours as needed for severe pain. 05/11/20   Rhyne, Ames Coupe, PA-C  rosuvastatin (CRESTOR) 10 MG tablet Take 1 tablet (10 mg total) by mouth at bedtime. 05/02/13   Angiulli, Mcarthur Rossetti, PA-C  tamsulosin (FLOMAX) 0.4 MG CAPS Take 1 capsule (0.4 mg total) by mouth at bedtime. Patient taking differently: Take 0.8 mg by mouth at bedtime.  05/02/13   Angiulli, Mcarthur Rossetti, PA-C  Tetrahydroz-Dextran-PEG-Povid (EYE DROPS ADVANCED RELIEF OP) Place 1 drop into both eyes daily as needed (dry eyes).     [provider]      Allergies    Clonidine derivatives    Review of Systems   Review of Systems  Constitutional:  Negative for chills and fever.  Respiratory:  Negative for shortness of breath.   Cardiovascular:  Negative for chest pain.  Gastrointestinal:  Positive for abdominal pain, diarrhea and vomiting.  Neurological:  Negative for headaches.    Physical Exam Updated Vital Signs BP (!) 148/65   Pulse 67   Resp (!) 27   SpO2 91%  Physical Exam Vitals and nursing note reviewed.  Constitutional:      General: He is not in acute distress.    Appearance: He is not ill-appearing.  HENT:     Head: Normocephalic and atraumatic.     Nose: No congestion.  Eyes:     Conjunctiva/sclera: Conjunctivae normal.  Cardiovascular:     Rate and Rhythm: Normal rate and regular rhythm.     Pulses: Normal pulses.     Heart sounds: No murmur heard.    No friction rub. No gallop.  Pulmonary:     Effort: No respiratory distress.     Breath sounds: No wheezing, rhonchi or rales.  Abdominal:     General: There is distension.     Palpations: Abdomen is soft.     Tenderness: There is abdominal tenderness. There is no right CVA tenderness or left CVA tenderness.     Comments: Abdomen is distended, tympanic, notable epigastric tenderness, slight right upper quadrant tenderness, there is no guarding rebound or  peritoneal sign.  Musculoskeletal:     Right lower leg: No edema.     Left lower leg: No edema.  Skin:    General: Skin is warm and dry.     Comments: Has right-sided EC fistula, no overlying skin changes, good palpable thrill.  Neurological:     Mental Status: He is alert.  Psychiatric:        Mood and Affect: Mood normal.     ED Results / Procedures / Treatments   Labs (all labs ordered are listed, but only abnormal results are displayed) Labs Reviewed  CBC WITH DIFFERENTIAL/PLATELET - Abnormal; Notable for the following components:      Result Value   RBC 3.69 (*)    Hemoglobin 10.8 (*)    HCT 34.6 (*)    Abs  Immature Granulocytes 0.08 (*)    All other components within normal limits  COMPREHENSIVE METABOLIC PANEL  LIPASE, BLOOD  TROPONIN I (HIGH SENSITIVITY)    EKG EKG Interpretation Date/Time:  Monday June 19 2023 04:56:05 EDT Ventricular Rate:  70 PR Interval:  196 QRS Duration:  138 QT Interval:  454 QTC Calculation: 490 R Axis:   75  Text Interpretation: Sinus rhythm Right bundle branch block Similar to July 2024 tracing Confirmed by Alona Bene [phone]) on 06/19/2023 5:13:16 AM  Radiology US Abdomen Limited  Result Date: 06/19/2023 CLINICAL DATA:  Right upper quadrant pain EXAM: ULTRASOUND ABDOMEN LIMITED RIGHT UPPER QUADRANT COMPARISON:  06/07/2023 abdominal CT FINDINGS: Gallbladder: Sludge with possible stone at the neck measuring 9 mm, although not clearly shadowing. No focal tenderness or wall thickening. Common bile duct: Diameter: 7 mm, normal for age. Liver: No focal lesion identified. Within normal limits in parenchymal echogenicity. Portal vein is patent on color Doppler imaging with normal direction of blood flow towards the liver. IMPRESSION: Gallbladder sludge and equivocal stone in the gallbladder neck. A small gallstone was seen by CT recently. No evidence of cholecystitis. Electronically Signed   By: Tiburcio Pea M.D.   On: 06/19/2023 06:18     Procedures Procedures    Medications Ordered in ED Medications  ondansetron (ZOFRAN) injection 4 mg (has no administration in time range)  famotidine (PEPCID) tablet 20 mg (has no administration in time range)  HYDROmorphone (DILAUDID) injection 0.5 mg (has no administration in time range)    ED Course/ Medical Decision Making/ A&P                                 Medical Decision Making Amount and/or Complexity of Data Reviewed Labs: ordered. Radiology: ordered.  Risk Prescription drug management.   This patient presents to the ED for concern of abdominal pain, this involves an extensive number of treatment options, and is a complaint that carries with it a high risk of complications and morbidity.  The differential diagnosis includes ACS, lower lobe pneumonia, PE, pancreatitis, cholecystitis, bowel obstruction, volvulus    Additional history obtained:  Additional history obtained from family at bedside External records from outside source obtained and reviewed including read the ER note   Co morbidities that complicate the patient evaluation  End-stage renal disease on dialysis  Social Determinants of Health:  N/A    Lab Tests:  I Ordered, and personally interpreted labs.  The pertinent results include: CBC shows normocytic anemia hemoglobin 10.8   Imaging Studies ordered:  I ordered imaging studies including limited ultrasound, acute chest abdomen I independently visualized and interpreted imaging which showed ultrasound reveals gallbladder sludge, stones noted in the gallbladder neck, no evidence of acute cholecystitis I agree with the radiologist interpretation   Cardiac Monitoring:  The patient was maintained on a cardiac monitor.  I personally viewed and interpreted the cardiac monitored which showed an underlying rhythm of: Sinus without signs of ischemia   Medicines ordered and prescription drug management:  I ordered medication including  antiemetics, pain medication, antiacids I have reviewed the patients home medicines and have made adjustments as needed  Critical Interventions:  N/A   Reevaluation:  Presents with epigastric tenderness, will obtain screening lab workup imaging and continue to monitor.  Consultations Obtained:  N/A    Test Considered:  N/A   Dispostion and problem list  Due to shift change patient is a handoff  to Dyanne Iha MD   Follow-up lab work and imaging and treat accordingly.            Final Clinical Impression(s) / ED Diagnoses Final diagnoses:  Epigastric pain    Rx / DC Orders ED Discharge Orders     None         Carroll Sage, PA-C 06/19/23 1610    Maia Plan, MD 06/22/23 [phone]

## 2023-06-20 ENCOUNTER — Inpatient Hospital Stay (HOSPITAL_COMMUNITY): Payer: Medicare Other

## 2023-06-20 ENCOUNTER — Inpatient Hospital Stay (HOSPITAL_COMMUNITY): Payer: Medicare Other | Admitting: Anesthesiology

## 2023-06-20 ENCOUNTER — Other Ambulatory Visit: Payer: Self-pay

## 2023-06-20 ENCOUNTER — Encounter (HOSPITAL_COMMUNITY): Payer: Self-pay | Admitting: Internal Medicine

## 2023-06-20 ENCOUNTER — Encounter (HOSPITAL_COMMUNITY)
Admission: EM | Disposition: A | Discharge status: Home Health | Payer: Self-pay | Source: Home / Self Care | Attending: Internal Medicine

## 2023-06-20 DIAGNOSIS — N189 Chronic kidney disease, unspecified: Secondary | ICD-10-CM | POA: Diagnosis not present

## 2023-06-20 DIAGNOSIS — N186 End stage renal disease: Secondary | ICD-10-CM | POA: Diagnosis not present

## 2023-06-20 DIAGNOSIS — J42 Unspecified chronic bronchitis: Secondary | ICD-10-CM | POA: Diagnosis not present

## 2023-06-20 DIAGNOSIS — E1122 Type 2 diabetes mellitus with diabetic chronic kidney disease: Secondary | ICD-10-CM

## 2023-06-20 DIAGNOSIS — K819 Cholecystitis, unspecified: Secondary | ICD-10-CM | POA: Diagnosis not present

## 2023-06-20 DIAGNOSIS — R7989 Other specified abnormal findings of blood chemistry: Secondary | ICD-10-CM

## 2023-06-20 DIAGNOSIS — Z992 Dependence on renal dialysis: Secondary | ICD-10-CM | POA: Diagnosis not present

## 2023-06-20 DIAGNOSIS — Z794 Long term (current) use of insulin: Secondary | ICD-10-CM

## 2023-06-20 DIAGNOSIS — I120 Hypertensive chronic kidney disease with stage 5 chronic kidney disease or end stage renal disease: Secondary | ICD-10-CM

## 2023-06-20 DIAGNOSIS — D631 Anemia in chronic kidney disease: Secondary | ICD-10-CM

## 2023-06-20 DIAGNOSIS — D500 Iron deficiency anemia secondary to blood loss (chronic): Secondary | ICD-10-CM | POA: Diagnosis not present

## 2023-06-20 DIAGNOSIS — K810 Acute cholecystitis: Secondary | ICD-10-CM | POA: Diagnosis not present

## 2023-06-20 HISTORY — PX: CHOLECYSTECTOMY: SHX55

## 2023-06-20 LAB — POCT I-STAT, CHEM 8
BUN: 25 mg/dL — ABNORMAL HIGH (ref 8–23)
Calcium, Ion: 1 mmol/L — ABNORMAL LOW (ref 1.15–1.40)
Chloride: 95 mmol/L — ABNORMAL LOW (ref 98–111)
Creatinine, Ser: 5.4 mg/dL — ABNORMAL HIGH (ref 0.61–1.24)
Glucose, Bld: 105 mg/dL — ABNORMAL HIGH (ref 70–99)
HCT: 37 % — ABNORMAL LOW (ref 39.0–52.0)
Hemoglobin: 12.6 g/dL — ABNORMAL LOW (ref 13.0–17.0)
Potassium: 4.1 mmol/L (ref 3.5–5.1)
Sodium: 135 mmol/L (ref 135–145)
TCO2: 30 mmol/L (ref 22–32)

## 2023-06-20 LAB — GLUCOSE, CAPILLARY
Glucose-Capillary: 108 mg/dL — ABNORMAL HIGH (ref 70–99)
Glucose-Capillary: 108 mg/dL — ABNORMAL HIGH (ref 70–99)
Glucose-Capillary: 112 mg/dL — ABNORMAL HIGH (ref 70–99)
Glucose-Capillary: 166 mg/dL — ABNORMAL HIGH (ref 70–99)
Glucose-Capillary: 96 mg/dL (ref 70–99)

## 2023-06-20 SURGERY — LAPAROSCOPIC CHOLECYSTECTOMY WITH INTRAOPERATIVE CHOLANGIOGRAM
Anesthesia: General

## 2023-06-20 MED ORDER — ACETAMINOPHEN 650 MG RE SUPP: 650.0000 mg | Freq: Four times a day (QID) | RECTAL | Status: DC

## 2023-06-20 MED ORDER — ACETAMINOPHEN 325 MG PO TABS
650.0000 mg | ORAL_TABLET | Freq: Four times a day (QID) | ORAL | Status: DC
  Administered 2023-06-20 – 2023-06-23 (×8): 650 mg via ORAL
  Filled 2023-06-20 (×8): qty 2

## 2023-06-20 MED ORDER — ACETAMINOPHEN 10 MG/ML IV SOLN: 1000.0000 mg | Freq: Once | INTRAVENOUS | Status: DC | PRN

## 2023-06-20 MED ORDER — OXYCODONE HCL 5 MG/5ML PO SOLN: 5.0000 mg | Freq: Once | ORAL | Status: DC | PRN

## 2023-06-20 MED ORDER — CHLORHEXIDINE GLUCONATE 0.12 % MT SOLN: 15.0000 mL | Freq: Once | OROMUCOSAL | Status: AC

## 2023-06-20 MED ORDER — PROPOFOL 10 MG/ML IV BOLUS
INTRAVENOUS | Status: DC | PRN
Start: 2023-06-20 — End: 2023-06-20
  Administered 2023-06-20: 100 mg via INTRAVENOUS

## 2023-06-20 MED ORDER — ORAL CARE MOUTH RINSE: 15.0000 mL | Freq: Once | OROMUCOSAL | Status: AC

## 2023-06-20 MED ORDER — LIDOCAINE 2% (20 MG/ML) 5 ML SYRINGE
INTRAMUSCULAR | Status: AC
  Filled 2023-06-20: qty 10

## 2023-06-20 MED ORDER — CISATRACURIUM BESYLATE (PF) 10 MG/5ML IV SOLN
INTRAVENOUS | Status: DC | PRN
  Administered 2023-06-20: 16 mg via INTRAVENOUS

## 2023-06-20 MED ORDER — HEPARIN SODIUM (PORCINE) 1000 UNIT/ML DIALYSIS: 2000.0000 [IU] | Freq: Once | INTRAMUSCULAR | Status: DC

## 2023-06-20 MED ORDER — ACETAMINOPHEN 500 MG PO TABS: 1000.0000 mg | ORAL_TABLET | Freq: Once | ORAL | Status: DC | PRN

## 2023-06-20 MED ORDER — ONDANSETRON HCL 4 MG/2ML IJ SOLN
INTRAMUSCULAR | Status: DC | PRN
  Administered 2023-06-20: 4 mg via INTRAVENOUS

## 2023-06-20 MED ORDER — CHLORHEXIDINE GLUCONATE 0.12 % MT SOLN
OROMUCOSAL | Status: AC
  Administered 2023-06-20: 15 mL via OROMUCOSAL
  Filled 2023-06-20: qty 15

## 2023-06-20 MED ORDER — BUPIVACAINE HCL 0.25 % IJ SOLN
INTRAMUSCULAR | Status: DC | PRN
  Administered 2023-06-20: 30 mL

## 2023-06-20 MED ORDER — NEOSTIGMINE METHYLSULFATE 3 MG/3ML IV SOSY
PREFILLED_SYRINGE | INTRAVENOUS | Status: DC | PRN
  Administered 2023-06-20: 3 mg via INTRAVENOUS

## 2023-06-20 MED ORDER — PHENYLEPHRINE 80 MCG/ML (10ML) SYRINGE FOR IV PUSH (FOR BLOOD PRESSURE SUPPORT)
PREFILLED_SYRINGE | INTRAVENOUS | Status: DC | PRN
  Administered 2023-06-20: 80 ug via INTRAVENOUS

## 2023-06-20 MED ORDER — GLYCOPYRROLATE 0.2 MG/ML IJ SOLN
INTRAMUSCULAR | Status: DC | PRN
  Administered 2023-06-20: .4 mg via INTRAVENOUS

## 2023-06-20 MED ORDER — PROPOFOL 10 MG/ML IV BOLUS
INTRAVENOUS | Status: AC
  Filled 2023-06-20: qty 20

## 2023-06-20 MED ORDER — ACETAMINOPHEN 160 MG/5ML PO SOLN: 1000.0000 mg | Freq: Once | ORAL | Status: DC | PRN

## 2023-06-20 MED ORDER — SODIUM CHLORIDE 0.9 % IV SOLN: INTRAVENOUS | Status: DC

## 2023-06-20 MED ORDER — TRAMADOL HCL 50 MG PO TABS
50.0000 mg | ORAL_TABLET | Freq: Four times a day (QID) | ORAL | Status: DC | PRN
  Administered 2023-06-20 – 2023-06-21 (×3): 50 mg via ORAL
  Filled 2023-06-20 (×3): qty 1

## 2023-06-20 MED ORDER — ALBUMIN HUMAN 5 % IV SOLN: INTRAVENOUS | Status: DC | PRN

## 2023-06-20 MED ORDER — ONDANSETRON HCL 4 MG/2ML IJ SOLN
INTRAMUSCULAR | Status: AC
  Filled 2023-06-20: qty 2

## 2023-06-20 MED ORDER — PHENYLEPHRINE HCL-NACL 20-0.9 MG/250ML-% IV SOLN
INTRAVENOUS | Status: DC | PRN
  Administered 2023-06-20: 30 ug/min via INTRAVENOUS

## 2023-06-20 MED ORDER — HEPARIN SODIUM (PORCINE) 1000 UNIT/ML DIALYSIS
2000.0000 [IU] | Freq: Once | INTRAMUSCULAR | Status: DC
  Filled 2023-06-20: qty 2

## 2023-06-20 MED ORDER — FENTANYL CITRATE (PF) 250 MCG/5ML IJ SOLN
INTRAMUSCULAR | Status: DC | PRN
  Administered 2023-06-20: 150 ug via INTRAVENOUS

## 2023-06-20 MED ORDER — PIPERACILLIN-TAZOBACTAM IN DEX 2-0.25 GM/50ML IV SOLN
2.2500 g | Freq: Three times a day (TID) | INTRAVENOUS | Status: DC
  Administered 2023-06-20 – 2023-06-22 (×5): 2.25 g via INTRAVENOUS
  Filled 2023-06-20 (×6): qty 50

## 2023-06-20 MED ORDER — PHENYLEPHRINE 80 MCG/ML (10ML) SYRINGE FOR IV PUSH (FOR BLOOD PRESSURE SUPPORT)
PREFILLED_SYRINGE | INTRAVENOUS | Status: AC
  Filled 2023-06-20: qty 10

## 2023-06-20 MED ORDER — EPHEDRINE SULFATE-NACL 50-0.9 MG/10ML-% IV SOSY
PREFILLED_SYRINGE | INTRAVENOUS | Status: DC | PRN
  Administered 2023-06-20: 5 mg via INTRAVENOUS
  Administered 2023-06-20: 10 mg via INTRAVENOUS

## 2023-06-20 MED ORDER — CISATRACURIUM BESYLATE 20 MG/10ML IV SOLN
INTRAVENOUS | Status: AC
  Filled 2023-06-20: qty 10

## 2023-06-20 MED ORDER — OXYCODONE HCL 5 MG PO TABS: 5.0000 mg | ORAL_TABLET | Freq: Once | ORAL | Status: DC | PRN

## 2023-06-20 MED ORDER — ONDANSETRON HCL 4 MG/2ML IJ SOLN: 4.0000 mg | Freq: Once | INTRAMUSCULAR | Status: DC | PRN

## 2023-06-20 MED ORDER — FENTANYL CITRATE (PF) 100 MCG/2ML IJ SOLN: 25.0000 ug | INTRAMUSCULAR | Status: DC | PRN

## 2023-06-20 MED ORDER — LIDOCAINE 2% (20 MG/ML) 5 ML SYRINGE
INTRAMUSCULAR | Status: DC | PRN
  Administered 2023-06-20: 60 mg via INTRAVENOUS

## 2023-06-20 MED ORDER — INSULIN ASPART 100 UNIT/ML IJ SOLN: 0.0000 [IU] | INTRAMUSCULAR | Status: DC | PRN

## 2023-06-20 SURGICAL SUPPLY — 55 items
ADH SKN CLS APL DERMABOND .7 (GAUZE/BANDAGES/DRESSINGS) ×1
APL PRP STRL LF DISP 70% ISPRP (MISCELLANEOUS) ×1
APPLIER CLIP ROT 10 11.4 M/L (STAPLE) ×1
APR CLP MED LRG 11.4X10 (STAPLE) ×1
BAG COUNTER SPONGE SURGICOUNT (BAG) ×2 IMPLANT
BAG SPEC RTRVL 10 TROC 200 (ENDOMECHANICALS) ×1
BAG SPNG CNTER NS LX DISP (BAG) ×1
CANISTER SUCT 3000ML PPV (MISCELLANEOUS) ×2 IMPLANT
CATH URET 5FR 28IN OPEN ENDED (CATHETERS) ×2 IMPLANT
CATH URETL OPEN 5X70 (CATHETERS) ×2 IMPLANT
CATH URETL OPEN END 6FR 70 (CATHETERS) ×2 IMPLANT
CHLORAPREP W/TINT 26 (MISCELLANEOUS) ×2 IMPLANT
CLIP APPLIE ROT 10 11.4 M/L (STAPLE) ×2 IMPLANT
COVER MAYO STAND STRL (DRAPES) IMPLANT
COVER SURGICAL LIGHT HANDLE (MISCELLANEOUS) ×2 IMPLANT
DERMABOND ADVANCED .7 DNX12 (GAUZE/BANDAGES/DRESSINGS) ×2 IMPLANT
DRAPE C-ARM 42X120 X-RAY (DRAPES) ×2 IMPLANT
ELECT REM PT RETURN 9FT ADLT (ELECTROSURGICAL) ×1
ELECTRODE REM PT RTRN 9FT ADLT (ELECTROSURGICAL) ×2 IMPLANT
ENDOLOOP SUT PDS II 0 18 (SUTURE) IMPLANT
GLOVE BIO SURGEON STRL SZ7.5 (GLOVE) ×2 IMPLANT
GLOVE BIOGEL PI IND STRL 8 (GLOVE) ×2 IMPLANT
GOWN STRL REUS W/ TWL LRG LVL3 (GOWN DISPOSABLE) ×4 IMPLANT
GOWN STRL REUS W/ TWL XL LVL3 (GOWN DISPOSABLE) ×2 IMPLANT
GOWN STRL REUS W/TWL LRG LVL3 (GOWN DISPOSABLE) ×2
GOWN STRL REUS W/TWL XL LVL3 (GOWN DISPOSABLE) ×1
GRASPER SUT TROCAR 14GX15 (MISCELLANEOUS) ×2 IMPLANT
IRRIG SUCT STRYKERFLOW 2 WTIP (MISCELLANEOUS) ×1
IRRIGATION SUCT STRKRFLW 2 WTP (MISCELLANEOUS) ×2 IMPLANT
IV CATH 14GX2 1/4 (CATHETERS) ×2 IMPLANT
IV CATH AUTO 14GX1.75 SAFE ORG (IV SOLUTION) ×2 IMPLANT
KIT BASIN OR (CUSTOM PROCEDURE TRAY) ×2 IMPLANT
KIT IMAGING PINPOINTPAQ (MISCELLANEOUS) IMPLANT
KIT TURNOVER KIT B (KITS) ×2 IMPLANT
NDL 22X1.5 STRL (OR ONLY) (MISCELLANEOUS) ×2 IMPLANT
NDL INSUFFLATION 14GA 120MM (NEEDLE) ×2 IMPLANT
NEEDLE 22X1.5 STRL (OR ONLY) (MISCELLANEOUS) ×1 IMPLANT
NEEDLE INSUFFLATION 14GA 120MM (NEEDLE) ×1 IMPLANT
NS IRRIG 1000ML POUR BTL (IV SOLUTION) ×2 IMPLANT
PAD ARMBOARD 7.5X6 YLW CONV (MISCELLANEOUS) ×2 IMPLANT
POUCH RETRIEVAL ECOSAC 10 (ENDOMECHANICALS) ×2 IMPLANT
SCISSORS LAP 5X35 DISP (ENDOMECHANICALS) ×2 IMPLANT
SET CHOLANGIOGRAPH 5 50 .035 (SET/KITS/TRAYS/PACK) ×2 IMPLANT
SET TUBE SMOKE EVAC HIGH FLOW (TUBING) ×2 IMPLANT
SLEEVE Z-THREAD 5X100MM (TROCAR) ×4 IMPLANT
SPECIMEN JAR SMALL (MISCELLANEOUS) ×2 IMPLANT
STOPCOCK 4 WAY LG BORE MALE ST (IV SETS) ×2 IMPLANT
SUT MNCRL AB 4-0 PS2 18 (SUTURE) ×2 IMPLANT
TOWEL GREEN STERILE (TOWEL DISPOSABLE) ×2 IMPLANT
TOWEL GREEN STERILE FF (TOWEL DISPOSABLE) ×2 IMPLANT
TRAY LAPAROSCOPIC MC (CUSTOM PROCEDURE TRAY) ×2 IMPLANT
TROCAR 11X100 Z THREAD (TROCAR) ×2 IMPLANT
TROCAR Z-THREAD OPTICAL 5X100M (TROCAR) ×2 IMPLANT
WARMER LAPAROSCOPE (MISCELLANEOUS) ×2 IMPLANT
WATER STERILE IRR 1000ML POUR (IV SOLUTION) ×2 IMPLANT

## 2023-06-20 NOTE — Progress Notes (Signed)
Progress Note: General Surgery Service   Chief Complaint/Subjective: Tolerating dialysis.  Abdominal pain improved slightly  Objective: Vital signs in last 24 hours: Temp:  [98.1 F (36.7 C)-99.3 F (37.4 C)] 98.1 F (36.7 C) (08/06 0808) Pulse Rate:  [55-66] 55 (08/06 0808) Resp:  [15-21] 17 (08/06 0808) BP: (116-151)/(41-83) 121/52 (08/06 0808) SpO2:  [92 %-100 %] 92 % (08/06 0808) Last BM Date : 06/18/23  Intake/Output from previous day: 08/05 0701 - 08/06 0700 In: 510.3 [P.O.:480; IV Piggyback:30.3] Out: 200 [Urine:200] Intake/Output this shift: Total I/O In: -  Out: -300   Constitutional: NAD; conversant; no deformities Eyes: Moist conjunctiva; no lid lag; anicteric; PERRL Neck: Trachea midline; no thyromegaly Lungs: Normal respiratory effort; no tactile fremitus CV: RRR; no palpable thrills; no pitting edema GI: Abd Soft, nontender; no palpable hepatosplenomegaly MSK: Normal range of motion of extremities; no clubbing/cyanosis Psychiatric: Appropriate affect; alert and oriented x3 Lymphatic: No palpable cervical or axillary lymphadenopathy  Lab Results: CBC  Recent Labs    06/19/23 0545 06/20/23 0201  WBC 8.0 6.9  HGB 10.8* 9.9*  HCT 34.6* 30.8*  PLT 192 188   BMET Recent Labs    06/19/23 0545 06/20/23 0201  NA 133* 135  K 4.8 4.2  CL 92* 91*  CO2 28 29  GLUCOSE 285* 120*  BUN 43* 45*  CREATININE 7.43* 8.21*  CALCIUM 8.4* 8.1*   PT/INR Recent Labs    06/19/23 0746  LABPROT 14.6  INR 1.1   ABG No results for input(s): "PHART", "HCO3" in the last 72 hours.  Invalid input(s): "PCO2", "PO2"  Anti-infectives: Anti-infectives (From admission, onward)    Start     Dose/Rate Route Frequency Ordered Stop   06/19/23 1400  piperacillin-tazobactam (ZOSYN) IVPB 2.25 g        2.25 g 100 mL/hr over 30 Minutes Intravenous Every 8 hours 06/19/23 1004     06/19/23 0800  piperacillin-tazobactam (ZOSYN) IVPB 3.375 g        3.375 g 100 mL/hr over 30  Minutes Intravenous  Once 06/19/23 0755 06/19/23 0941       Medications: Scheduled Meds:  Chlorhexidine Gluconate Cloth  6 each Topical Q0600   famotidine  20 mg Oral Once   heparin  2,000 Units Dialysis Once in dialysis   insulin aspart  0-6 Units Subcutaneous Q6H   metoprolol tartrate  5 mg Intravenous Q12H   Continuous Infusions:  piperacillin-tazobactam (ZOSYN)  IV 2.25 g (06/20/23 0527)   PRN Meds:.acetaminophen **OR** acetaminophen, fentaNYL (SUBLIMAZE) injection, hydrALAZINE, ondansetron **OR** ondansetron (ZOFRAN) IV  Assessment/Plan: Ryan Jimenez is a 89 year old male with ESRD and abdominal pain with findings of acute cholecystitis on imaging.  I recommended laparoscopic cholecystectomy.  We discussed percutaneous drainage of the gallbladder, but he would rather take the risk of surgery and have definitive treatment of his gallbladder pain.  We discussed the procedure, its risks, benefits and alternatives. After a full discussion and all questions answered the patient granted consent to proceed.  I discussed this plan with Dr. Jerral Ralph today.  He is finishing dialysis this morning then we will proceed to surgery this afternoon.   LOS: 1 day    Ryan Ore, MD  [facility] Use [email or web address] to contact on call provider  Daily Billing: [id number] - High MDM

## 2023-06-20 NOTE — Anesthesia Preprocedure Evaluation (Addendum)
Anesthesia Evaluation  Patient identified by MRN, date of birth, ID band Patient awake    Reviewed: Allergy & Precautions, NPO status , Patient's Chart, lab work & pertinent test results  Airway Mallampati: I  TM Distance: >3 FB Neck ROM: Full    Dental  (+) Edentulous Lower, Edentulous Upper   Pulmonary former smoker   Pulmonary exam normal        Cardiovascular hypertension, Pt. on medications and Pt. on home beta blockers Normal cardiovascular exam  1. Left ventricular ejection fraction, by estimation, is 60 to 65%. The  left ventricle has normal function. The left ventricle has no regional  wall motion abnormalities. Left ventricular diastolic parameters are  consistent with Grade I diastolic  dysfunction (impaired relaxation).   2. Right ventricular systolic function is normal. The right ventricular  size is normal. Tricuspid regurgitation signal is inadequate for assessing  PA pressure.   3. The mitral valve is normal in structure. No evidence of mitral valve  regurgitation.   4. The aortic valve is tricuspid. Aortic valve regurgitation is not  visualized.   5. The inferior vena cava is normal in size with greater than 50%  respiratory variability, suggesting right atrial pressure of 3 mmHg.     Neuro/Psych TIA negative psych ROS   GI/Hepatic Neg liver ROS,,,  Endo/Other  diabetes, Type 2, Insulin Dependent  Lab Results      Component                Value               Date                      HGBA1C                   6.4 (H)             06/19/2023             Renal/GU ESRF and DialysisRenal disease     Musculoskeletal  (+) Arthritis ,    Abdominal   Peds  Hematology  (+) Blood dyscrasia, anemia Lab Results      Component                Value               Date                      WBC                      6.9                 06/20/2023                HGB                      9.9 (L)              06/20/2023                HCT                      30.8 (L)            06/20/2023                MCV  93.3                06/20/2023                PLT                      188                 06/20/2023              Anesthesia Other Findings cholecystitis  Reproductive/Obstetrics                             Anesthesia Physical Anesthesia Plan  ASA: 3  Anesthesia Plan: General   Post-op Pain Management:    Induction: Intravenous  PONV Risk Score and Plan: 4 or greater and Ondansetron, Dexamethasone and Treatment may vary due to age or medical condition  Airway Management Planned: Oral ETT  Additional Equipment:   Intra-op Plan:   Post-operative Plan: Extubation in OR  Informed Consent: I have reviewed the patients History and Physical, chart, labs and discussed the procedure including the risks, benefits and alternatives for the proposed anesthesia with the patient or authorized representative who has indicated his/her understanding and acceptance.     Dental advisory given  Plan Discussed with: CRNA  Anesthesia Plan Comments:         Anesthesia Quick Evaluation

## 2023-06-20 NOTE — Anesthesia Postprocedure Evaluation (Signed)
Anesthesia Post Note  Patient: Driscilla Moats Hellwig  Procedure(s) Performed: LAPAROSCOPIC CHOLECYSTECTOMY     Patient location during evaluation: PACU Anesthesia Type: General Level of consciousness: awake Pain management: pain level controlled Vital Signs Assessment: post-procedure vital signs reviewed and stable Respiratory status: spontaneous breathing, nonlabored ventilation and respiratory function stable Cardiovascular status: blood pressure returned to baseline and stable Postop Assessment: no apparent nausea or vomiting Anesthetic complications: no   No notable events documented.  Last Vitals:  Vitals:   06/20/23 1615 06/20/23 1630  BP: (!) 134/52 (!) 138/55  Pulse: 60 61  Resp: 18 20  Temp:  36.5 C  SpO2: 94% 95%    Last Pain:  Vitals:   06/20/23 1630  TempSrc:   PainSc: Asleep                  P 

## 2023-06-20 NOTE — Transfer of Care (Signed)
Immediate Anesthesia Transfer of Care Note  Patient: Ryan Jimenez  Procedure(s) Performed: LAPAROSCOPIC CHOLECYSTECTOMY  Patient Location: PACU  Anesthesia Type:General  Level of Consciousness: awake, alert , and oriented  Airway & Oxygen Therapy: Patient Spontanous Breathing and Patient connected to nasal cannula oxygen  Post-op Assessment: Report given to RN and Post -op Vital signs reviewed and stable  Post vital signs: Reviewed and stable  Last Vitals:  Vitals Value Taken Time  BP 126/57 06/20/23 1600  Temp 36.4 C 06/20/23 1600  Pulse 61 06/20/23 1606  Resp 16 06/20/23 1606  SpO2 91 % 06/20/23 1606  Vitals shown include unfiled device data.  Last Pain:  Vitals:   06/20/23 1600  TempSrc:   PainSc: 0-No pain      Patients Stated Pain Goal: 2 (06/20/23 1255)  Complications: No notable events documented.

## 2023-06-20 NOTE — Op Note (Signed)
Patient: Ryan Jimenez (1931-12-31, [id number])  Date of Surgery: 06/21/23  Preoperative Diagnosis: cholecystitis   Postoperative Diagnosis: cholecystitis   Surgical Procedure: LAPAROSCOPIC CHOLECYSTECTOMY: 40981 (CPT)   Operative Team Members:  Surgeons and Role:    * , Hyman Hopes, MD - Primary   Reatha Harps, MD - Assistant  Anesthesiologist: Leonides Grills, MD CRNA: Marena Chancy, CRNA   Anesthesia: General   Fluids:  Total I/O In: 800 [I.V.:500; IV Piggyback:300] Out: 13250.7 [Other:13225.7; Blood:25]  Complications: none  Drains:  none   Specimen:  ID Type Source Tests Collected by Time Destination  1 : gallbladder Tissue PATH Gallbladder SURGICAL PATHOLOGY , Hyman Hopes, MD 06/20/2023 1306      Disposition:  PACU - hemodynamically stable.  Plan of Care:  Continue inpatient care    Indications for Procedure: Ryan Jimenez is a 89 y.o. male who presented with abdominal pain.  History, physical and imaging was concerning for cholecystitis.  Laparoscopic cholecystectomy was recommended for the patient.  The procedure itself, as well as the risks, benefits and alternatives were discussed with the patient.  Risks discussed included but were not limited to the risk of infection, bleeding, damage to nearby structures, need to convert to open procedure, incisional hernia, bile leak, common bile duct injury and the need for additional procedures or surgeries.  With this discussion complete and all questions answered the patient granted consent to proceed.  Findings: Severe cholecystitis with gangrenous changes.  Unable to perform IOC due to the poor quality of the cystic duct tissue.  Infection status: Patient: Ryan Jimenez Emergency General Surgery Service Patient Case: Urgent Infection Present At Time Of Surgery (PATOS):  Inflamed gallbladder   Description of Procedure:   On the date stated above, the patient was taken to the operating room  suite and placed in supine positioning.  Sequential compression devices were placed on the lower extremities to prevent blood clots.  General endotracheal anesthesia was induced. Preoperative antibiotics were given.  The patient's abdomen was prepped and draped in the usual sterile fashion.  A time-out was completed verifying the correct patient, procedure, positioning and equipment needed for the case.  We began by anesthetizing the skin with local anesthetic and then making a 5 mm incision just below the umbilicus.  We dissected through the subcutaneous tissues to the fascia.  The fascia was grasped and elevated using a Kocher clamp.  A Veress needle was inserted into the abdomen and the abdomen was insufflated to 15 mmHg.  A 5 mm trocar was inserted in this position under optical guidance and then the abdomen was inspected.  There was no trauma to the underlying viscera with initial trocar placement.  Any abnormal findings, other than inflammation in the right upper quadrant, are listed above in the findings section.  Three additional trocars were placed, one 12 mm trocar in the subxiphoid position, one 5 mm trocar in the midline epigastric area and one 5mm trocar in the right upper quadrant subcostally.  These were placed under direct vision without any trauma to the underlying viscera.    The patient was then placed in head up, left side down positioning.  The gallbladder was identified and dissected free from its attachments to the omentum allowing the duodenum to fall away.  The infundibulum of the gallbladder was dissected free working laterally to medially.  The cystic duct and cystic artery were dissected free from surrounding connective tissue.  The infundibulum of the gallbladder was dissected off  the cystic plate.  A critical view of safety was obtained with the cystic duct and cystic artery being cleared of connective tissues and clearly the only two structures entering into the gallbladder with  the liver clearly visible behind.  Clips were then applied to the cystic duct and cystic artery and then these structures were divided.  A PDS endoloop was applied to the cystic duct stump. The gallbladder was dissected off the cystic plate, placed in an endocatch bag and removed from the 12 mm subxiphoid port site.  The clips were inspected and appeared effective.  The cystic plate was inspected and hemostasis was obtained using electrocautery.  A suction irrigator was used to clean the operative field.  Snow topical hemostatic agent was applied to the gallbladder fossa.  Attention was turned to closure.  The 12 mm subxiphoid port site was closed using a 0-vicryl suture on a fascial suture passer.  The abdomen was desufflated.  The skin was closed using 4-0 monocryl and dermabond.  All sponge and needle counts were correct at the conclusion of the case.    Ryan Drape, MD General, Bariatric, & Minimally Invasive Surgery [facility], [place]

## 2023-06-20 NOTE — Anesthesia Procedure Notes (Signed)
Procedure Name: Intubation Date/Time: 06/20/2023 3:05 PM  Performed by: Marena Chancy, CRNAPre-anesthesia Checklist: Patient identified, Emergency Drugs available, Suction available and Patient being monitored Patient Re-evaluated:Patient Re-evaluated prior to induction Oxygen Delivery Method: Circle System Utilized Preoxygenation: Pre-oxygenation with 100% oxygen Induction Type: IV induction Ventilation: Mask ventilation without difficulty and Oral airway inserted - appropriate to patient size Laryngoscope Size: Mac and 4 Grade View: Grade I Tube type: Oral Tube size: 7.5 mm Number of attempts: 1 Airway Equipment and Method: Stylet and Oral airway Placement Confirmation: ETT inserted through vocal cords under direct vision, positive ETCO2 and breath sounds checked- equal and bilateral Secured at: 22 cm Tube secured with: Tape Dental Injury: Teeth and Oropharynx as per pre-operative assessment

## 2023-06-20 NOTE — Progress Notes (Signed)
[facility] Progress Note  Subjective: seen in HD unit, going to OR "later today" for GB removal  Vitals:   06/20/23 0830 06/20/23 0900 06/20/23 0930 06/20/23 1000  BP: 135/65 (!) 108/45 (!) 142/52 (!) 137/53  Pulse: 63 (!) 57 62 63  Resp: 19 16 17 18   Temp:      TempSrc:      SpO2: 98% 96% 96% 100%  Weight:        Exam: Gen alert, no distress, [place] O2 No jvd or bruits Chest clear bilat to bases RRR no MRG Abd soft ntnd no mass or ascites +bs Ext no LE edema Neuro is alert, Ox 3 , nf    RUA AVF+bruit    Home meds include - albuterol, allopruinol, amlodipine 10, aspirin, benzonatate, carvedilol 12.5 bid, ceterizine, colchicine prn, dutasteride, fluticasone, furosemide 80 bid, hydralazine 25 bid, insulin aspart/ levemir, mirtazapine, MVI, norel prn, omeprazole, percocet, rosvastatin, tamsulosin, eye drops      OP HD: - MWF G-O  4h   400/600   95.5kg   2/2.5 bath  AVF  Heparin 3000 - last HD 8/02, post wt 95kg   - venofer 50mg  IV weekly - rocaltrol 0.5 mcg po three times per week - sensipar 30mg  po three times per week       CXR - small effusions at bases vs atx vs other   Assessment/ Plan: Abdominal pain/ ^LFT"S - pt has acute cholecystitis, going to OR later today. On IV abx.  ESRD - on HD MWF.  HD today off schedule. Next HD Thursday.  HTN/ volume - Bp's stable, holding home HTN meds. No vol overload on exam, up 1kg.   Anemia esrd - Hb 10.8, no esa needs at this time.  MBD ckd - CCa and phos are in range. Follow.  DM2 - on insulin , per pmd     Vinson Moselle MD  [facility] 06/20/2023, 10:48 AM  Recent Labs  Lab 06/19/23 0545 06/20/23 0201  HGB 10.8* 9.9*  ALBUMIN 2.9* 2.4*  CALCIUM 8.4* 8.1*  PHOS 4.6  --   CREATININE 7.43* 8.21*  K 4.8 4.2   No results for input(s): "IRON", "TIBC", "FERRITIN" in the last 168 hours. Inpatient medications:  Chlorhexidine Gluconate Cloth  6 each Topical Q0600   famotidine  20 mg Oral Once   heparin  2,000 Units  Dialysis Once in dialysis   insulin aspart  0-6 Units Subcutaneous Q6H   metoprolol tartrate  5 mg Intravenous Q12H    piperacillin-tazobactam (ZOSYN)  IV 2.25 g (06/20/23 0527)   acetaminophen **OR** acetaminophen, fentaNYL (SUBLIMAZE) injection, hydrALAZINE, ondansetron **OR** ondansetron (ZOFRAN) IV

## 2023-06-20 NOTE — Progress Notes (Signed)
PROGRESS NOTE        PATIENT DETAILS Name: Ryan Jimenez Age: 89 y.o. Sex: male Date of Birth: 08/07/1932 Admit Date: 06/19/2023 Admitting Physician Bobette Mo, MD ZOX:[id number], [name], MD  Brief Summary: Patient is a 89 y.o.  male with a history of ESRD on HD MWF, DM-2, HTN, HLD-who presented with RUQ pain-thought to have acute cholecystitis and subsequently admitted to the hospitalist service.  Significant events: 8/5>> admitted to [facility]  Significant studies: 8/5>> CT abdomen/pelvis: Probable acute cholecystitis 8/5>> RUQ ultrasound: Sludge/equivocal stone gallbladder neck. 8/5>> echo: EF 60-65%, grade 1 diastolic dysfunction.  Significant microbiology data: 8/5>> COVID PCR/influenza/RSV PCR: Negative  Procedures: None  Consults: Nephrology GI General surgery  Subjective: RUQ pain somewhat better this morning.  No other complaints.  Objective: Vitals: Blood pressure (!) 134/47, pulse 60, temperature 98.1 F (36.7 C), temperature source Oral, resp. rate 17, weight 96.4 kg, SpO2 98%.   Exam: Gen Exam:Alert awake-not in any distress HEENT:atraumatic, normocephalic Chest: B/L clear to auscultation anteriorly CVS:S1S2 regular Abdomen: Soft-mildly tender in the RUQ area. Extremities:no edema Neurology: Non focal Skin: no rash  Pertinent Labs/Radiology:    Latest Ref Rng & Units 06/20/2023    2:01 AM 06/19/2023    5:45 AM 06/07/2023   12:34 AM  CBC  WBC 4.0 - 10.5 K/uL 6.9  8.0  7.4   Hemoglobin 13.0 - 17.0 g/dL 9.9  09.8  11.9   Hematocrit 39.0 - 52.0 % 30.8  34.6  37.3   Platelets 150 - 400 K/uL 188  192  128     Lab Results  Component Value Date   NA 135 06/20/2023   K 4.2 06/20/2023   CL 91 (L) 06/20/2023   CO2 29 06/20/2023      Assessment/Plan: Acute calculus cholecystitis RUQ pain better IV antibiotics Laparoscopic cholecystectomy planned later today.  ESRD on HD MWF Nephrology following.  Normocytic  anemia Secondary to CKD/ESRD Iron/iron per nephrology  HTN BP stable on IV metoprolol scheduled on IV hydralazine Once no longer n.p.o.-resume usual oral medications.  HLD Statin on hold due to transaminitis  DM-2 (A1c 6.4 on 8/5) CBG stable with SSI  Recent Labs    06/19/23 2344 06/20/23 0616 06/20/23 0740  GLUCAP 156* 108* 96   Gout Stable Resume allopurinol when able  Chronic bronchitis Stable Bronchodilators  Obesity: Estimated body mass index is 31.38 kg/m as calculated from the following:   Height as of 06/07/23: 5\' 9"  (1.753 m).   Weight as of this encounter: 96.4 kg.   Code status:   Code Status: Full Code   DVT Prophylaxis: SCDs Start: 06/19/23 1006   Family Communication: None at bedside   Disposition Plan: Status is: Inpatient Remains inpatient appropriate because: Severity of illness   Planned Discharge Destination:Home   Diet: Diet Order             Diet NPO time specified  Diet effective now                     Antimicrobial agents: Anti-infectives (From admission, onward)    Start     Dose/Rate Route Frequency Ordered Stop   06/19/23 1400  piperacillin-tazobactam (ZOSYN) IVPB 2.25 g        2.25 g 100 mL/hr over 30 Minutes Intravenous Every 8 hours 06/19/23 1004  06/19/23 0800  piperacillin-tazobactam (ZOSYN) IVPB 3.375 g        3.375 g 100 mL/hr over 30 Minutes Intravenous  Once 06/19/23 0755 06/19/23 0941        MEDICATIONS: Scheduled Meds:  Chlorhexidine Gluconate Cloth  6 each Topical Q0600   famotidine  20 mg Oral Once   heparin  2,000 Units Dialysis Once in dialysis   insulin aspart  0-6 Units Subcutaneous Q6H   metoprolol tartrate  5 mg Intravenous Q12H   Continuous Infusions:  piperacillin-tazobactam (ZOSYN)  IV 2.25 g (06/20/23 0527)   PRN Meds:.acetaminophen **OR** acetaminophen, fentaNYL (SUBLIMAZE) injection, hydrALAZINE, ondansetron **OR** ondansetron (ZOFRAN) IV   I have personally reviewed  following labs and imaging studies  LABORATORY DATA: CBC: Recent Labs  Lab 06/19/23 0545 06/20/23 0201  WBC 8.0 6.9  NEUTROABS 6.5  --   HGB 10.8* 9.9*  HCT 34.6* 30.8*  MCV 93.8 93.3  PLT 192 188    Basic Metabolic Panel: Recent Labs  Lab 06/19/23 0545 06/20/23 0201  NA 133* 135  K 4.8 4.2  CL 92* 91*  CO2 28 29  GLUCOSE 285* 120*  BUN 43* 45*  CREATININE 7.43* 8.21*  CALCIUM 8.4* 8.1*  PHOS 4.6  --     GFR: Estimated Creatinine Clearance: 7 mL/min (A) (by C-G formula based on SCr of 8.21 mg/dL (H)).  Liver Function Tests: Recent Labs  Lab 06/19/23 0545 06/20/23 0201  AST 1,185* 374*  ALT 783* 530*  ALKPHOS 229* 238*  BILITOT 1.1 1.7*  PROT 7.0 6.1*  ALBUMIN 2.9* 2.4*   Recent Labs  Lab 06/19/23 0545  LIPASE 44   No results for input(s): "AMMONIA" in the last 168 hours.  Coagulation Profile: Recent Labs  Lab 06/19/23 0746  INR 1.1    Cardiac Enzymes: No results for input(s): "CKTOTAL", "CKMB", "CKMBINDEX", "TROPONINI" in the last 168 hours.  BNP (last 3 results) No results for input(s): "PROBNP" in the last 8760 hours.  Lipid Profile: No results for input(s): "CHOL", "HDL", "LDLCALC", "TRIG", "CHOLHDL", "LDLDIRECT" in the last 72 hours.  Thyroid Function Tests: No results for input(s): "TSH", "T4TOTAL", "FREET4", "T3FREE", "THYROIDAB" in the last 72 hours.  Anemia Panel: No results for input(s): "VITAMINB12", "FOLATE", "FERRITIN", "TIBC", "IRON", "RETICCTPCT" in the last 72 hours.  Urine analysis:    Component Value Date/Time   COLORURINE YELLOW 06/07/2023 0324   APPEARANCEUR CLEAR 06/07/2023 0324   LABSPEC 1.014 06/07/2023 0324   PHURINE 7.0 06/07/2023 0324   GLUCOSEU NEGATIVE 06/07/2023 0324   HGBUR NEGATIVE 06/07/2023 0324   BILIRUBINUR NEGATIVE 06/07/2023 0324   KETONESUR NEGATIVE 06/07/2023 0324   PROTEINUR 100 (A) 06/07/2023 0324   UROBILINOGEN 0.2 04/22/2013 2301   NITRITE NEGATIVE 06/07/2023 0324   LEUKOCYTESUR  NEGATIVE 06/07/2023 0324    Sepsis Labs: Lactic Acid, Venous    Component Value Date/Time   LATICACIDVEN 1.5 04/04/2012 1528    MICROBIOLOGY: Recent Results (from the past 240 hour(s))  Resp panel by RT-PCR (RSV, Flu A&B, Covid) Anterior Nasal Swab     Status: None   Collection Time: 06/19/23  7:07 AM   Specimen: Anterior Nasal Swab  Result Value Ref Range Status   SARS Coronavirus 2 by RT PCR NEGATIVE NEGATIVE Final   Influenza A by PCR NEGATIVE NEGATIVE Final   Influenza B by PCR NEGATIVE NEGATIVE Final    Comment: (NOTE) The Xpert Xpress SARS-CoV-2/FLU/RSV plus assay is intended as an aid in the diagnosis of influenza from Nasopharyngeal swab specimens and should  not be used as a sole basis for treatment. Nasal washings and aspirates are unacceptable for Xpert Xpress SARS-CoV-2/FLU/RSV testing.  Fact Sheet for Patients: BloggerCourse.com  Fact Sheet for Healthcare Providers: SeriousBroker.it  This test is not yet approved or cleared by the [place] FDA and has been authorized for detection and/or diagnosis of SARS-CoV-2 by FDA under an Emergency Use Authorization (EUA). This EUA will remain in effect (meaning this test can be used) for the duration of the COVID-19 declaration under Section 564(b)(1) of the Act, 21 U.S.C. section 360bbb-3(b)(1), unless the authorization is terminated or revoked.     Resp Syncytial Virus by PCR NEGATIVE NEGATIVE Final    Comment: (NOTE) Fact Sheet for Patients: BloggerCourse.com  Fact Sheet for Healthcare Providers: SeriousBroker.it  This test is not yet approved or cleared by the [place] FDA and has been authorized for detection and/or diagnosis of SARS-CoV-2 by FDA under an Emergency Use Authorization (EUA). This EUA will remain in effect (meaning this test can be used) for the duration of the COVID-19 declaration under  Section 564(b)(1) of the Act, 21 U.S.C. section 360bbb-3(b)(1), unless the authorization is terminated or revoked.  Performed at [facility], [address] [place], [place] [address]     RADIOLOGY STUDIES/RESULTS: ECHOCARDIOGRAM COMPLETE  Result Date: 06/19/2023    ECHOCARDIOGRAM REPORT   Patient Name:   JOSYIAH LEO Date of Exam: 06/19/2023 Medical Rec #:  [id number]        Height:       69.0 in Accession #:    [id number]       Weight:       198.0 lb Date of Birth:  September 19, 1932        BSA:          2.057 m Patient Age:    89 years         BP:           160/62 mmHg Patient Gender: M                HR:           65 bpm. Exam Location:  Inpatient Procedure: 2D Echo, Color Doppler, Cardiac Doppler and Intracardiac            Opacification Agent Indications:    Abnormal ECG                 Congestive Heart Failure  History:        Patient has prior history of Echocardiogram examinations, most                 recent 05/17/2020. Signs/Symptoms:Dyspnea and Shortness of Breath;                 Risk Factors:Diabetes, Dyslipidemia and Hypertension.  Sonographer:    Raeford Razor Referring Phys: Ernestene Kiel ORTIZ  Sonographer Comments: Technically difficult study due to poor echo windows. Image acquisition challenging due to patient body habitus. IMPRESSIONS  1. Left ventricular ejection fraction, by estimation, is 60 to 65%. The left ventricle has normal function. The left ventricle has no regional wall motion abnormalities. Left ventricular diastolic parameters are consistent with Grade I diastolic dysfunction (impaired relaxation).  2. Right ventricular systolic function is normal. The right ventricular size is normal. Tricuspid regurgitation signal is inadequate for assessing PA pressure.  3. The mitral valve is normal in structure. No evidence of mitral valve regurgitation.  4. The aortic valve is tricuspid. Aortic valve regurgitation  is not visualized.  5. The inferior vena cava is normal in size with  greater than 50% respiratory variability, suggesting right atrial pressure of 3 mmHg. FINDINGS  Left Ventricle: Left ventricular ejection fraction, by estimation, is 60 to 65%. The left ventricle has normal function. The left ventricle has no regional wall motion abnormalities. Definity contrast agent was given IV to delineate the left ventricular  endocardial borders. The left ventricular internal cavity size was normal in size. There is no left ventricular hypertrophy. Left ventricular diastolic parameters are consistent with Grade I diastolic dysfunction (impaired relaxation). Right Ventricle: The right ventricular size is normal. Right ventricular systolic function is normal. Tricuspid regurgitation signal is inadequate for assessing PA pressure. Left Atrium: Left atrial size was normal in size. Right Atrium: Right atrial size was normal in size. Pericardium: There is no evidence of pericardial effusion. Mitral Valve: The mitral valve is normal in structure. No evidence of mitral valve regurgitation. Tricuspid Valve: Tricuspid valve regurgitation is not demonstrated. Aortic Valve: The aortic valve is tricuspid. Aortic valve regurgitation is not visualized. Aortic valve peak gradient measures 6.9 mmHg. Pulmonic Valve: The pulmonic valve was normal in structure. Pulmonic valve regurgitation is not visualized. Aorta: The aortic root and ascending aorta are structurally normal, with no evidence of dilitation. Venous: The inferior vena cava is normal in size with greater than 50% respiratory variability, suggesting right atrial pressure of 3 mmHg. IAS/Shunts: No atrial level shunt detected by color flow Doppler.  LEFT VENTRICLE PLAX 2D LVIDd:         5.50 cm   Diastology LVIDs:         3.60 cm   LV e' medial:    4.35 cm/s LV PW:         0.90 cm   LV E/e' medial:  16.7 LV IVS:        0.90 cm   LV e' lateral:   9.46 cm/s LVOT diam:     2.20 cm   LV E/e' lateral: 7.7 LV SV:         88 LV SV Index:   43 LVOT Area:      3.80 cm  RIGHT VENTRICLE             IVC RV Basal diam:  2.90 cm     IVC diam: 1.30 cm RV S prime:     12.00 cm/s TAPSE (M-mode): 2.3 cm LEFT ATRIUM             Index        RIGHT ATRIUM           Index LA diam:        3.60 cm 1.75 cm/m   RA Area:     13.00 cm LA Vol (A2C):   47.3 ml 22.99 ml/m  RA Volume:   29.20 ml  14.19 ml/m LA Vol (A4C):   67.8 ml 32.96 ml/m LA Biplane Vol: 60.0 ml 29.17 ml/m  AORTIC VALVE AV Area (Vmax): 3.31 cm AV Vmax:        131.00 cm/s AV Peak Grad:   6.9 mmHg LVOT Vmax:      114.00 cm/s LVOT Vmean:     69.400 cm/s LVOT VTI:       0.231 m  AORTA Ao Root diam: 3.20 cm Ao Asc diam:  3.60 cm MITRAL VALVE MV Area (PHT): 2.91 cm    SHUNTS MV Decel Time: 261 msec    Systemic VTI:  0.23 m MV E  velocity: 72.50 cm/s  Systemic Diam: 2.20 cm MV A velocity: 92.20 cm/s MV E/A ratio:  0.79 Photographer signed by Carolan Clines Signature Date/Time: 06/19/2023/5:53:14 PM    Final    CT ABDOMEN PELVIS W CONTRAST  Result Date: 06/19/2023 CLINICAL DATA:  Bowel obstruction suspected EXAM: CT ABDOMEN AND PELVIS WITH CONTRAST TECHNIQUE: Multidetector CT imaging of the abdomen and pelvis was performed using the standard protocol following bolus administration of intravenous contrast. RADIATION DOSE REDUCTION: This exam was performed according to the departmental dose-optimization program which includes automated exposure control, adjustment of the mA and/or kV according to patient size and/or use of iterative reconstruction technique. CONTRAST:  75mL OMNIPAQUE IOHEXOL 350 MG/ML SOLN COMPARISON:  06/07/2023 FINDINGS: Lower chest: There are patchy infiltrates seen both lower lung fields. There is interval worsening of infiltrate in right lower lung field. Minimal right pleural effusion is seen. Coronary artery calcifications are seen. Hepatobiliary: There is mild periportal edema. There is gallbladder wall thickening. These small amount of fluid adjacent to the gallbladder. Small ascites is  noted in perihepatic region. There is no dilation of bile ducts. Pancreas: No focal abnormalities are seen. Spleen: Unremarkable. Adrenals/Urinary Tract: Adrenals are unremarkable. Both kidneys are smaller than usual incised with cortical atrophy. This finding has not changed. There is no hydronephrosis. There are no renal or ureteral stones. Urinary bladder is unremarkable. Stomach/Bowel: Stomach is not distended. Small bowel loops are not dilated. Appendix is not dilated. Cecum is higher and more medial than usual in position. Multiple diverticula are seen in colon. There are no signs of focal diverticulitis. Vascular/Lymphatic: Calcifications are seen in aorta and its major branches. Reproductive: Unremarkable. Other: There is no pneumoperitoneum. Umbilical hernia containing fat is seen. Musculoskeletal: Schmorl's nodes are seen in multiple vertebrae. Degenerative changes are noted in lumbar spine. No interval changes are noted. IMPRESSION: Gallbladder is distended. There is wall thickening in gallbladder and pericholecystic fluid. Possibility of cholecystitis is not excluded. Please correlate with findings in the sonogram done earlier today. There is mild periportal edema which may be due to nonspecific hepatitis. There is small perihepatic ascites. There is no evidence of intestinal obstruction or pneumoperitoneum. There is no hydronephrosis. Appendix is not dilated. Diverticulosis of colon without signs of diverticulitis. Aortic arteriosclerosis. Coronary artery calcifications are seen. Lumbar spondylosis. There are patchy infiltrates in the lower lung fields with interval worsening in right lower lung field suggesting atelectasis/pneumonia. Minimal right pleural effusion. Electronically Signed   By: Ernie Avena M.D.   On: 06/19/2023 09:28   DG Abdomen Acute W/Chest  Result Date: 06/19/2023 CLINICAL DATA:  89 year old male with history of chest pain and abdominal distension. Epigastric pain. EXAM:  DG ABDOMEN ACUTE WITH 1 VIEW CHEST COMPARISON:  Chest x-ray 05/19/2020. FINDINGS: Lung volumes are low. Bibasilar opacities are noted, which may reflect areas of atelectasis and/or consolidation. Blunting of the costophrenic sulci bilaterally suggestive of small bilateral pleural effusions. No pneumothorax. No evidence of pulmonary edema. Heart size is normal. Atherosclerotic calcifications are noted in the thoracic aorta. Dedicated views of the abdomen demonstrates some mildly dilated loops of small bowel in the central abdomen measuring up to 3.8 cm in diameter. Paucity of distal colonic gas. No definite pneumoperitoneum. IMPRESSION: 1. Abnormal bowel-gas pattern concerning for early or partial small bowel obstruction. 2. No pneumoperitoneum. 3. Low lung volumes with bibasilar opacities which may reflect areas of atelectasis and/or consolidation, likely with superimposed small bilateral pleural effusions. 4. Aortic atherosclerosis. Electronically Signed   By: Reuel Boom  Entrikin M.D.   On: 06/19/2023 06:30   US Abdomen Limited  Result Date: 06/19/2023 CLINICAL DATA:  Right upper quadrant pain EXAM: ULTRASOUND ABDOMEN LIMITED RIGHT UPPER QUADRANT COMPARISON:  06/07/2023 abdominal CT FINDINGS: Gallbladder: Sludge with possible stone at the neck measuring 9 mm, although not clearly shadowing. No focal tenderness or wall thickening. Common bile duct: Diameter: 7 mm, normal for age. Liver: No focal lesion identified. Within normal limits in parenchymal echogenicity. Portal vein is patent on color Doppler imaging with normal direction of blood flow towards the liver. IMPRESSION: Gallbladder sludge and equivocal stone in the gallbladder neck. A small gallstone was seen by CT recently. No evidence of cholecystitis. Electronically Signed   By: Tiburcio Pea M.D.   On: 06/19/2023 06:18     LOS: 1 day   Jeoffrey Massed, MD  [facility]    To contact the attending provider between 7A-7P or the covering  provider during after hours 7P-7A, please log into the web site [email or web address] and access using universal [place] password for that web site. If you do not have the password, please call the hospital operator.  06/20/2023, 11:58 AM

## 2023-06-20 NOTE — Progress Notes (Addendum)
Daily Progress Note  DOA: 06/19/2023 Hospital Day: 2 Chief Complaint: abdominal pain / chest pain / elevated liver chemistries  ASSESSMENT & PLAN   Brief Narrative:  Ryan Jimenez is a 89 y.o. year old male with a history of  hypertension, diabetes, ESRD on HD MWF, diverticulosis, cholelithiasis   89 yo male with 2 weeks of chest and epigastric pain, N/V and new elevation in liver tests of mixed pattern.   Imaging + cholelithiasis concerning for acute cholecystitis. No biliary duct dilation / evidence for choledocholithiasis.  -Acetaminophen level negative.  -Acute hepatitis panel negative.  -liver chemistries improved overnight -General Surgery agrees with dx of acute cholecystitis and plan is for laparoscopic cholecystectomy later today -WBC normal  -On zosyn    Chronic [place] anemia. Likely 2/2 chronic disease / CKD. Hgb stable   ESRD on HD MWF    -----------------------------------------------------------------------------------------------  GI Attending  Going for cholecystectomy today - we will sign off and be available for additional input, if needed  Iva Boop, MD, [facility] See AMION on call - gastroenterology for best contact person 06/20/2023 2:36 PM   Subjective   Had recurrent pain after PO intake last evening. NPO today, no further pain. Getting dialysis right now   Objective   Recent Labs    06/19/23 0545 06/20/23 0201  WBC 8.0 6.9  HGB 10.8* 9.9*  HCT 34.6* 30.8*  PLT 192 188   BMET Recent Labs    06/19/23 0545 06/20/23 0201  NA 133* 135  K 4.8 4.2  CL 92* 91*  CO2 28 29  GLUCOSE 285* 120*  BUN 43* 45*  CREATININE 7.43* 8.21*  CALCIUM 8.4* 8.1*   LFT Recent Labs    06/20/23 0201  PROT 6.1*  ALBUMIN 2.4*  AST 374*  ALT 530*  ALKPHOS 238*  BILITOT 1.7*   PT/INR Recent Labs    06/19/23 0746  LABPROT 14.6  INR 1.1     Imaging:  ECHOCARDIOGRAM COMPLETE    ECHOCARDIOGRAM REPORT        Patient Name:   SHAFIQ MAHLER Date of Exam: 06/19/2023 Medical Rec #:  [id number]        Height:       69.0 in Accession #:    [id number]       Weight:       198.0 lb Date of Birth:  [birth year]        BSA:          2.057 m Patient Age:    89 years         BP:           160/62 mmHg Patient Gender: M                HR:           65 bpm. Exam Location:  Inpatient  Procedure: 2D Echo, Color Doppler, Cardiac Doppler and Intracardiac            Opacification Agent  Indications:    Abnormal ECG                 Congestive Heart Failure   History:        Patient has prior history of Echocardiogram examinations, most                 recent 05/17/2020. Signs/Symptoms:Dyspnea and Shortness of Breath;  Risk Factors:Diabetes, Dyslipidemia and Hypertension.   Sonographer:    Raeford Razor Referring Phys: Ernestene Kiel ORTIZ    Sonographer Comments: Technically difficult study due to poor echo windows. Image acquisition challenging due to patient body habitus. IMPRESSIONS   1. Left ventricular ejection fraction, by estimation, is 60 to 65%. The left ventricle has normal function. The left ventricle has no regional wall motion abnormalities. Left ventricular diastolic parameters are consistent with Grade I diastolic  dysfunction (impaired relaxation).  2. Right ventricular systolic function is normal. The right ventricular size is normal. Tricuspid regurgitation signal is inadequate for assessing PA pressure.  3. The mitral valve is normal in structure. No evidence of mitral valve regurgitation.  4. The aortic valve is tricuspid. Aortic valve regurgitation is not visualized.  5. The inferior vena cava is normal in size with greater than 50% respiratory variability, suggesting right atrial pressure of 3 mmHg.  FINDINGS  Left Ventricle: Left ventricular ejection fraction, by estimation, is 60 to 65%. The left ventricle has normal function. The left ventricle has no regional wall motion  abnormalities. Definity contrast agent was given IV to delineate the left ventricular  endocardial borders. The left ventricular internal cavity size was normal in size. There is no left ventricular hypertrophy. Left ventricular diastolic parameters are consistent with Grade I diastolic dysfunction (impaired relaxation).  Right Ventricle: The right ventricular size is normal. Right ventricular systolic function is normal. Tricuspid regurgitation signal is inadequate for assessing PA pressure.  Left Atrium: Left atrial size was normal in size.  Right Atrium: Right atrial size was normal in size.  Pericardium: There is no evidence of pericardial effusion.  Mitral Valve: The mitral valve is normal in structure. No evidence of mitral valve regurgitation.  Tricuspid Valve: Tricuspid valve regurgitation is not demonstrated.  Aortic Valve: The aortic valve is tricuspid. Aortic valve regurgitation is not visualized. Aortic valve peak gradient measures 6.9 mmHg.  Pulmonic Valve: The pulmonic valve was normal in structure. Pulmonic valve regurgitation is not visualized.  Aorta: The aortic root and ascending aorta are structurally normal, with no evidence of dilitation.  Venous: The inferior vena cava is normal in size with greater than 50% respiratory variability, suggesting right atrial pressure of 3 mmHg.  IAS/Shunts: No atrial level shunt detected by color flow Doppler.    LEFT VENTRICLE PLAX 2D LVIDd:         5.50 cm   Diastology LVIDs:         3.60 cm   LV e' medial:    4.35 cm/s LV PW:         0.90 cm   LV E/e' medial:  16.7 LV IVS:        0.90 cm   LV e' lateral:   9.46 cm/s LVOT diam:     2.20 cm   LV E/e' lateral: 7.7 LV SV:         88 LV SV Index:   43 LVOT Area:     3.80 cm    RIGHT VENTRICLE             IVC RV Basal diam:  2.90 cm     IVC diam: 1.30 cm RV S prime:     12.00 cm/s TAPSE (M-mode): 2.3 cm  LEFT ATRIUM             Index        RIGHT ATRIUM           Index LA  diam:  3.60 cm 1.75 cm/m   RA Area:     13.00 cm LA Vol (A2C):   47.3 ml 22.99 ml/m  RA Volume:   29.20 ml  14.19 ml/m LA Vol (A4C):   67.8 ml 32.96 ml/m LA Biplane Vol: 60.0 ml 29.17 ml/m  AORTIC VALVE AV Area (Vmax): 3.31 cm AV Vmax:        131.00 cm/s AV Peak Grad:   6.9 mmHg LVOT Vmax:      114.00 cm/s LVOT Vmean:     69.400 cm/s LVOT VTI:       0.231 m   AORTA Ao Root diam: 3.20 cm Ao Asc diam:  3.60 cm  MITRAL VALVE MV Area (PHT): 2.91 cm    SHUNTS MV Decel Time: 261 msec    Systemic VTI:  0.23 m MV E velocity: 72.50 cm/s  Systemic Diam: 2.20 cm MV A velocity: 92.20 cm/s MV E/A ratio:  0.79  Photographer signed by Carolan Clines Signature Date/Time: 06/19/2023/5:53:14 PM      Final   CT ABDOMEN PELVIS W CONTRAST CLINICAL DATA:  Bowel obstruction suspected  EXAM: CT ABDOMEN AND PELVIS WITH CONTRAST  TECHNIQUE: Multidetector CT imaging of the abdomen and pelvis was performed using the standard protocol following bolus administration of intravenous contrast.  RADIATION DOSE REDUCTION: This exam was performed according to the departmental dose-optimization program which includes automated exposure control, adjustment of the mA and/or kV according to patient size and/or use of iterative reconstruction technique.  CONTRAST:  75mL OMNIPAQUE IOHEXOL 350 MG/ML SOLN  COMPARISON:  06/07/2023  FINDINGS: Lower chest: There are patchy infiltrates seen both lower lung fields. There is interval worsening of infiltrate in right lower lung field. Minimal right pleural effusion is seen. Coronary artery calcifications are seen.  Hepatobiliary: There is mild periportal edema. There is gallbladder wall thickening. These small amount of fluid adjacent to the gallbladder. Small ascites is noted in perihepatic region. There is no dilation of bile ducts.  Pancreas: No focal abnormalities are seen.  Spleen: Unremarkable.  Adrenals/Urinary Tract:  Adrenals are unremarkable. Both kidneys are smaller than usual incised with cortical atrophy. This finding has not changed. There is no hydronephrosis. There are no renal or ureteral stones. Urinary bladder is unremarkable.  Stomach/Bowel: Stomach is not distended. Small bowel loops are not dilated. Appendix is not dilated. Cecum is higher and more medial than usual in position. Multiple diverticula are seen in colon. There are no signs of focal diverticulitis.  Vascular/Lymphatic: Calcifications are seen in aorta and its major branches.  Reproductive: Unremarkable.  Other: There is no pneumoperitoneum. Umbilical hernia containing fat is seen.  Musculoskeletal: Schmorl's nodes are seen in multiple vertebrae. Degenerative changes are noted in lumbar spine. No interval changes are noted.  IMPRESSION: Gallbladder is distended. There is wall thickening in gallbladder and pericholecystic fluid. Possibility of cholecystitis is not excluded. Please correlate with findings in the sonogram done earlier today.  There is mild periportal edema which may be due to nonspecific hepatitis. There is small perihepatic ascites.  There is no evidence of intestinal obstruction or pneumoperitoneum. There is no hydronephrosis. Appendix is not dilated.  Diverticulosis of colon without signs of diverticulitis. Aortic arteriosclerosis. Coronary artery calcifications are seen. Lumbar spondylosis.  There are patchy infiltrates in the lower lung fields with interval worsening in right lower lung field suggesting atelectasis/pneumonia. Minimal right pleural effusion.  Electronically Signed   By: Ernie Avena M.D.   On: 06/19/2023 09:28 DG Abdomen Acute W/Chest CLINICAL  DATA:  89 year old male with history of chest pain and abdominal distension. Epigastric pain.  EXAM: DG ABDOMEN ACUTE WITH 1 VIEW CHEST  COMPARISON:  Chest x-ray 05/19/2020.  FINDINGS: Lung volumes are low. Bibasilar  opacities are noted, which may reflect areas of atelectasis and/or consolidation. Blunting of the costophrenic sulci bilaterally suggestive of small bilateral pleural effusions. No pneumothorax. No evidence of pulmonary edema. Heart size is normal. Atherosclerotic calcifications are noted in the thoracic aorta.  Dedicated views of the abdomen demonstrates some mildly dilated loops of small bowel in the central abdomen measuring up to 3.8 cm in diameter. Paucity of distal colonic gas. No definite pneumoperitoneum.  IMPRESSION: 1. Abnormal bowel-gas pattern concerning for early or partial small bowel obstruction. 2. No pneumoperitoneum. 3. Low lung volumes with bibasilar opacities which may reflect areas of atelectasis and/or consolidation, likely with superimposed small bilateral pleural effusions. 4. Aortic atherosclerosis.  Electronically Signed   By: Trudie Reed M.D.   On: 06/19/2023 06:30 US Abdomen Limited CLINICAL DATA:  Right upper quadrant pain  EXAM: ULTRASOUND ABDOMEN LIMITED RIGHT UPPER QUADRANT  COMPARISON:  06/07/2023 abdominal CT  FINDINGS: Gallbladder:  Sludge with possible stone at the neck measuring 9 mm, although not clearly shadowing. No focal tenderness or wall thickening.  Common bile duct:  Diameter: 7 mm, normal for age.  Liver:  No focal lesion identified. Within normal limits in parenchymal echogenicity. Portal vein is patent on color Doppler imaging with normal direction of blood flow towards the liver.  IMPRESSION: Gallbladder sludge and equivocal stone in the gallbladder neck. A small gallstone was seen by CT recently. No evidence of cholecystitis.  Electronically Signed   By: Tiburcio Pea M.D.   On: 06/19/2023 06:18     Scheduled inpatient medications:   Chlorhexidine Gluconate Cloth  6 each Topical Q0600   famotidine  20 mg Oral Once   heparin  2,000 Units Dialysis Once in dialysis   insulin aspart  0-6 Units  Subcutaneous Q6H   metoprolol tartrate  5 mg Intravenous Q12H   Continuous inpatient infusions:   piperacillin-tazobactam (ZOSYN)  IV 2.25 g (06/20/23 0527)   PRN inpatient medications: acetaminophen **OR** acetaminophen, fentaNYL (SUBLIMAZE) injection, hydrALAZINE, ondansetron **OR** ondansetron (ZOFRAN) IV  Vital signs in last 24 hours: Temp:  [98.1 F (36.7 C)-99.3 F (37.4 C)] 98.1 F (36.7 C) (08/06 0808) Pulse Rate:  [55-69] 69 (08/06 1200) Resp:  [16-21] 17 (08/06 1100) BP: (108-151)/(41-83) 135/48 (08/06 1200) SpO2:  [92 %-100 %] 93 % (08/06 1200) Weight:  [96.4 kg] 96.4 kg (08/06 0808) Last BM Date : 06/18/23  Intake/Output Summary (Last 24 hours) at 06/20/2023 1218 Last data filed at 06/20/2023 1100 Gross per 24 hour  Intake 510.33 ml  Output 3955.29 ml  Net -3444.96 ml    Intake/Output from previous day: 08/05 0701 - 08/06 0700 In: 510.3 [P.O.:480; IV Piggyback:30.3] Out: 200 [Urine:200] Intake/Output this shift: Total I/O In: -  Out: 3755.3 [Other:3755.3]   Physical Exam:  General: Alert male in NAD Heart:  Regular rate .  Pulmonary: Normal respiratory effort Abdomen: Soft, nondistended, mild mid upper abdominal tenderness.  Normal bowel sounds. Neurologic: Alert and oriented Psych: Pleasant. Cooperative. Insight appears normal.    Principal Problem:   Acute cholecystitis Active Problems:   Hypercholesterolemia   Hypertension   ESRD on hemodialysis (HCC)   Anemia associated with chronic renal failure   Moderate protein malnutrition (HCC)   Type 2 diabetes mellitus with hyperglycemia (HCC)   Gout   Chronic  bronchitis (HCC)     LOS: 1 day   Willette Cluster ,NP 06/20/2023, 12:18 PM

## 2023-06-20 NOTE — Progress Notes (Signed)
POST HD TX NOTE  06/20/23 1255  Vitals  Temp 98.3 F (36.8 C)  Temp Source Oral  BP (!) 113/57  MAP (mmHg) 74  BP Location Left Arm  BP Method Automatic  Patient Position (if appropriate) Lying  Pulse Rate 71  Pulse Rate Source Monitor  ECG Heart Rate 71  Resp 17  Oxygen Therapy  SpO2 99 %  O2 Device Nasal Cannula  O2 Flow Rate (L/min) 3 L/min  Pulse Oximetry Type Continuous  During Treatment Monitoring  Intra-Hemodialysis Comments (S)   (post HD tx VS check)  Post Treatment  Dialyzer Clearance Lightly streaked  Duration of HD Treatment -hour(s) 3.25 hour(s)  Hemodialysis Intake (mL) 0 mL  Liters Processed 68.4  Fluid Removed (mL) 3000 mL  Tolerated HD Treatment Yes  Post-Hemodialysis Comments (S)  tx completed w/o problem other than 1st system clotting off and having to re start tx on 2nd set up, UF goal met, blood rinsed back, VSS. Medication Admin: NA --> ordered Heparin pre tx bolus was held d/t pt having gall bladder surgery directly after HD tx  AVG/AVF Arterial Site Held (minutes) 5 minutes  AVG/AVF Venous Site Held (minutes) 5 minutes  Fistula / Graft Right Upper arm Arteriovenous fistula  Placement Date/Time: 03/10/20 0834   Placed prior to admission: No  Orientation: Right  Access Location: (c) Upper arm  Access Type: (c) Arteriovenous fistula  Site Condition No complications  Fistula / Graft Assessment Bruit;Thrill;Present  Drainage Description None

## 2023-06-21 ENCOUNTER — Encounter (HOSPITAL_COMMUNITY): Payer: Self-pay | Admitting: Surgery

## 2023-06-21 DIAGNOSIS — E1165 Type 2 diabetes mellitus with hyperglycemia: Secondary | ICD-10-CM | POA: Diagnosis not present

## 2023-06-21 DIAGNOSIS — Z794 Long term (current) use of insulin: Secondary | ICD-10-CM

## 2023-06-21 DIAGNOSIS — M109 Gout, unspecified: Secondary | ICD-10-CM

## 2023-06-21 DIAGNOSIS — N186 End stage renal disease: Secondary | ICD-10-CM | POA: Diagnosis not present

## 2023-06-21 DIAGNOSIS — K810 Acute cholecystitis: Secondary | ICD-10-CM | POA: Diagnosis not present

## 2023-06-21 LAB — GLUCOSE, CAPILLARY
Glucose-Capillary: 119 mg/dL — ABNORMAL HIGH (ref 70–99)
Glucose-Capillary: 160 mg/dL — ABNORMAL HIGH (ref 70–99)
Glucose-Capillary: 199 mg/dL — ABNORMAL HIGH (ref 70–99)
Glucose-Capillary: 135 mg/dL — ABNORMAL HIGH (ref 70–99)

## 2023-06-21 MED ORDER — CARVEDILOL 12.5 MG PO TABS
12.5000 mg | ORAL_TABLET | Freq: Two times a day (BID) | ORAL | Status: DC
  Administered 2023-06-21 – 2023-06-23 (×4): 12.5 mg via ORAL
  Filled 2023-06-21 (×3): qty 1

## 2023-06-21 MED ORDER — COLCHICINE 0.6 MG PO TABS: 0.6000 mg | ORAL_TABLET | Freq: Every day | ORAL | Status: DC | PRN

## 2023-06-21 MED ORDER — CHLORHEXIDINE GLUCONATE CLOTH 2 % EX PADS
6.0000 | MEDICATED_PAD | Freq: Every day | CUTANEOUS | Status: DC
  Administered 2023-06-22 – 2023-06-23 (×2): 6 via TOPICAL

## 2023-06-21 MED ORDER — ALLOPURINOL 100 MG PO TABS
100.0000 mg | ORAL_TABLET | Freq: Every day | ORAL | Status: DC
  Administered 2023-06-21 – 2023-06-23 (×3): 100 mg via ORAL
  Filled 2023-06-21 (×3): qty 1

## 2023-06-21 MED ORDER — INSULIN ASPART 100 UNIT/ML IJ SOLN
0.0000 [IU] | Freq: Three times a day (TID) | INTRAMUSCULAR | Status: DC
  Administered 2023-06-21 – 2023-06-22 (×3): 1 [IU] via SUBCUTANEOUS

## 2023-06-21 MED ORDER — PANTOPRAZOLE SODIUM 40 MG PO TBEC
40.0000 mg | DELAYED_RELEASE_TABLET | Freq: Every day | ORAL | Status: DC
  Administered 2023-06-21 – 2023-06-22 (×2): 40 mg via ORAL
  Filled 2023-06-21 (×2): qty 1

## 2023-06-21 NOTE — Evaluation (Signed)
Physical Therapy Evaluation Patient Details Name: Ryan Jimenez MRN: [id number] DOB: May 07, 1932 Today's Date: 06/21/2023  History of Present Illness  89 y.o. male admitted  8/5 with acute cholecystitis. S/p  LAPAROSCOPIC CHOLECYSTECTOMY 8/6. PMHx: hypertension, diabetes, ESRD dialysis M,W,F.  Clinical Impression  Pt admitted with above diagnosis. S/p laproscopic cholecystectomy 8/6. Previously Mod I with mobility using SPC, denies falls. Currently requires contact guard assist to safely transfer and ambulate using a rolling walker for extra support. Pt states daughter is coming to stay with him and assist as needed. Also has a brother to assist. From [place] but stays at his vacation home here in [place].  Pt currently with functional limitations due to the deficits listed below (see PT Problem List). Pt will benefit from acute skilled PT to increase their independence and safety with mobility to allow discharge.    SpO2 on 2L at rest 99-100%. Ambulating on RA 91-92%. Reports dizziness, BP 112/53 seated at end of session, HR 89.          If plan is discharge home, recommend the following: A little help with walking and/or transfers;A little help with bathing/dressing/bathroom;Assistance with cooking/housework;Assist for transportation;Help with stairs or ramp for entrance   Can travel by private vehicle        Equipment Recommendations Rolling walker (2 wheels)  Recommendations for Other Services       Functional Status Assessment Patient has had a recent decline in their functional status and demonstrates the ability to make significant improvements in function in a reasonable and predictable amount of time.     Precautions / Restrictions Precautions Precautions: Fall Precaution Comments: Monitor O2 Restrictions Weight Bearing Restrictions: No      Mobility  Bed Mobility Overal bed mobility: Modified Independent             General bed mobility comments: extra time. Cues for  technique.    Transfers Overall transfer level: Needs assistance Equipment used: Rolling walker (2 wheels), Straight cane Transfers: Sit to/from Stand Sit to Stand: Contact guard assist           General transfer comment: CGA for sit to stand with SPC and RW. More steady with RW. Cues for appropriate use.    Ambulation/Gait Ambulation/Gait assistance: Contact guard assist Gait Distance (Feet): 80 Feet (10 ft with SPC) Assistive device: Rolling walker (2 wheels), Straight cane Gait Pattern/deviations: Step-through pattern, Decreased stride length, Drifts right/left, Wide base of support Gait velocity: decr Gait velocity interpretation: <1.8 ft/sec, indicate of risk for recurrent falls   General Gait Details: Pt felt unsteady with his SPC, transitioned to RW which did improve balance and gait. Educated on appropriate use. First half of distance steady with supervision for safety. Second half, as pt fatigued, required CGA due to Rt drift and lean with notable imbalance. Able to self correct. Cues for slower pace, not to rush with turns, and to remain within BOS of RW.  Stairs            Wheelchair Mobility     Tilt Bed    Modified Rankin (Stroke Patients Only)       Balance Overall balance assessment: Needs assistance Sitting-balance support: No upper extremity supported, Feet supported Sitting balance-Leahy Scale: Good     Standing balance support: Single extremity supported, Reliant on assistive device for balance, During functional activity Standing balance-Leahy Scale: Poor  Pertinent Vitals/Pain Pain Assessment Pain Assessment: 0-10 Pain Score: 3  Pain Location: abdomen Pain Descriptors / Indicators: Headache, Operative site guarding, Sore Pain Intervention(s): Monitored during session, Repositioned    Home Living Family/patient expects to be discharged to:: Private residence Living Arrangements:  Children Available Help at Discharge: Family;Available 24 hours/day Type of Home: House Home Access: Stairs to enter Entrance Stairs-Rails: None Entrance Stairs-Number of Steps: 2   Home Layout: One level Home Equipment: Chartered certified accountant - single point;Educational psychologist (4 wheels)      Prior Function Prior Level of Function : Needs assist             Mobility Comments: SPC in home, short distance outside. Rollator and motor scooter for longer distances.  Driving. ADLs Comments: States brother would make meals. Sits on seat in shower, performs hygiene on his own.     Extremity/Trunk Assessment   Upper Extremity Assessment Upper Extremity Assessment: Defer to OT evaluation    Lower Extremity Assessment Lower Extremity Assessment: Generalized weakness       Communication   Communication Communication: No apparent difficulties  Cognition Arousal: Alert Behavior During Therapy: WFL for tasks assessed/performed Overall Cognitive Status: Within Functional Limits for tasks assessed                                          General Comments General comments (skin integrity, edema, etc.): SpO2 on 2L at rest 99-100%. Ambulating on RA 91-92%. Reports dizziness, BP 112/53 seated at end of session, HR 89.    Exercises     Assessment/Plan    PT Assessment Patient needs continued PT services  PT Problem List Decreased strength;Decreased activity tolerance;Decreased balance;Decreased mobility;Decreased coordination;Decreased knowledge of use of DME;Cardiopulmonary status limiting activity;Obesity;Pain       PT Treatment Interventions DME instruction;Gait training;Stair training;Functional mobility training;Therapeutic activities;Therapeutic exercise;Balance training;Neuromuscular re-education;Patient/family education    PT Goals (Current goals can be found in the Care Plan section)  Acute Rehab PT Goals Patient Stated Goal: Get well, go home PT Goal  Formulation: With patient Time For Goal Achievement: 07/05/23 Potential to Achieve Goals: Good    Frequency Min 1X/week     Co-evaluation               AM-PAC PT "6 Clicks" Mobility  Outcome Measure Help needed turning from your back to your side while in a flat bed without using bedrails?: None Help needed moving from lying on your back to sitting on the side of a flat bed without using bedrails?: None Help needed moving to and from a bed to a chair (including a wheelchair)?: A Little Help needed standing up from a chair using your arms (e.g., wheelchair or bedside chair)?: A Little Help needed to walk in hospital room?: A Little Help needed climbing 3-5 steps with a railing? : A Little 6 Click Score: 20    End of Session Equipment Utilized During Treatment: Gait belt Activity Tolerance: Patient tolerated treatment well Patient left: in bed;with call bell/phone within reach;with bed alarm set Nurse Communication: Mobility status PT Visit Diagnosis: Unsteadiness on feet (R26.81);Other abnormalities of gait and mobility (R26.89);Muscle weakness (generalized) (M62.81);Pain Pain - part of body:  (abdomen)    Time: 6578-4696 PT Time Calculation (min) (ACUTE ONLY): 28 min   Charges:   PT Evaluation $PT Eval Low Complexity: 1 Low PT Treatments $Gait Training: 8-22 mins PT General Charges $$  ACUTE PT VISIT: 1 Visit         Kathlyn Sacramento, PT, DPT [facility]  Rehabilitation Services Physical Therapist Office: [phone] Website: [email or web address]   Berton Mount 06/21/2023, 10:20 AM

## 2023-06-21 NOTE — Discharge Instructions (Signed)

## 2023-06-21 NOTE — Progress Notes (Signed)
PROGRESS NOTE        PATIENT DETAILS Name: Ryan Jimenez Age: 89 y.o. Sex: male Date of Birth: 08-14-1932 Admit Date: 06/19/2023 Admitting Physician No admitting provider for patient encounter. [email or web address], [name], MD  Brief Summary: Patient is a 89 y.o.  male with a history of ESRD on HD MWF, DM-2, HTN, HLD-who presented with RUQ pain-thought to have acute cholecystitis and subsequently admitted to the hospitalist service.  Significant events: 8/5>> admitted to [facility]  Significant studies: 8/5>> CT abdomen/pelvis: Probable acute cholecystitis 8/5>> RUQ ultrasound: Sludge/equivocal stone gallbladder neck. 8/5>> echo: EF 60-65%, grade 1 diastolic dysfunction.  Significant microbiology data: 8/5>> COVID PCR/influenza/RSV PCR: Negative  Procedures: 8/7>> laparoscopic cholecystectomy  Consults: Nephrology GI General surgery  Subjective: Feels better-passing flatus.  Tolerating clears  Objective: Vitals: Blood pressure (!) 132/58, pulse (!) 58, temperature 98.4 F (36.9 C), temperature source Oral, resp. rate 14, height 5\' 9"  (1.753 m), weight 94 kg, SpO2 99%.   Exam: Gen Exam:Alert awake-not in any distress HEENT:atraumatic, normocephalic Chest: B/L clear to auscultation anteriorly CVS:S1S2 regular Abdomen:soft non tender, non distended Extremities:no edema Neurology: Non focal Skin: no rash  Pertinent Labs/Radiology:    Latest Ref Rng & Units 06/21/2023   12:02 AM 06/20/2023    2:08 PM 06/20/2023    2:01 AM  CBC  WBC 4.0 - 10.5 K/uL 8.9   6.9   Hemoglobin 13.0 - 17.0 g/dL 93.7  16.9  9.9   Hematocrit 39.0 - 52.0 % 32.4  37.0  30.8   Platelets 150 - 400 K/uL 226   188     Lab Results  Component Value Date   NA 133 (L) 06/21/2023   K 4.2 06/21/2023   CL 92 (L) 06/21/2023   CO2 28 06/21/2023      Assessment/Plan: Acute calculus cholecystitis-s/p laparoscopic cholecystectomy on 8/6 Passing flatus Tolerating clears Mobilize with  PT/OT Continue Zosyn Diet slowly being advanced  CCS following  ESRD on HD MWF Nephrology following.  Normocytic anemia Secondary to CKD/ESRD Iron/iron per nephrology  HTN BP stable Stop IV metoprolol-switch back to Coreg Depending on BP trend-resume amlodipine/Avodart/hydralazine over the next several days.   HLD Statin on hold due to transaminitis  DM-2 (A1c 6.4 on 8/5) CBG stable with SSI  Recent Labs    06/20/23 2335 06/21/23 0519 06/21/23 1142  GLUCAP 166* 119* 160*   Gout Stable Allopurinol  Chronic bronchitis Stable Bronchodilators  Obesity: Estimated body mass index is 30.6 kg/m as calculated from the following:   Height as of this encounter: 5\' 9"  (1.753 m).   Weight as of this encounter: 94 kg.   Code status:   Code Status: Full Code   DVT Prophylaxis: SCDs Start: 06/19/23 1006   Family Communication: None at bedside   Disposition Plan: Status is: Inpatient Remains inpatient appropriate because: Severity of illness   Planned Discharge Destination:Home   Diet: Diet Order             Diet renal/carb modified with fluid restriction Diet-HS Snack? Nothing; Fluid restriction: 1200 mL Fluid; Room service appropriate? Yes; Fluid consistency: Thin  Diet effective now                     Antimicrobial agents: Anti-infectives (From admission, onward)    Start     Dose/Rate Route Frequency Ordered Stop   06/20/23  2200  piperacillin-tazobactam (ZOSYN) IVPB 2.25 g        2.25 g 100 mL/hr over 30 Minutes Intravenous Every 8 hours 06/20/23 1645 06/24/23 2159   06/19/23 1400  piperacillin-tazobactam (ZOSYN) IVPB 2.25 g  Status:  Discontinued        2.25 g 100 mL/hr over 30 Minutes Intravenous Every 8 hours 06/19/23 1004 06/20/23 1645   06/19/23 0800  piperacillin-tazobactam (ZOSYN) IVPB 3.375 g        3.375 g 100 mL/hr over 30 Minutes Intravenous  Once 06/19/23 0755 06/19/23 0941        MEDICATIONS: Scheduled Meds:  acetaminophen   650 mg Oral Q6H   Or   acetaminophen  650 mg Rectal Q6H   carvedilol  12.5 mg Oral BID WC   Chlorhexidine Gluconate Cloth  6 each Topical Q0600   famotidine  20 mg Oral Once   insulin aspart  0-6 Units Subcutaneous TID WC   Continuous Infusions:  piperacillin-tazobactam (ZOSYN)  IV 2.25 g (06/21/23 0528)   PRN Meds:.fentaNYL (SUBLIMAZE) injection, hydrALAZINE, ondansetron **OR** ondansetron (ZOFRAN) IV, traMADol   I have personally reviewed following labs and imaging studies  LABORATORY DATA: CBC: Recent Labs  Lab 06/19/23 0545 06/20/23 0201 06/20/23 1408 06/21/23 0002  WBC 8.0 6.9  --  8.9  NEUTROABS 6.5  --   --   --   HGB 10.8* 9.9* 12.6* 10.2*  HCT 34.6* 30.8* 37.0* 32.4*  MCV 93.8 93.3  --  92.8  PLT 192 188  --  226    Basic Metabolic Panel: Recent Labs  Lab 06/19/23 0545 06/20/23 0201 06/20/23 1408 06/21/23 0002  NA 133* 135 135 133*  K 4.8 4.2 4.1 4.2  CL 92* 91* 95* 92*  CO2 28 29  --  28  GLUCOSE 285* 120* 105* 165*  BUN 43* 45* 25* 26*  CREATININE 7.43* 8.21* 5.40* 5.80*  CALCIUM 8.4* 8.1*  --  8.2*  PHOS 4.6  --   --   --     GFR: Estimated Creatinine Clearance: 9.8 mL/min (A) (by C-G formula based on SCr of 5.8 mg/dL (H)).  Liver Function Tests: Recent Labs  Lab 06/19/23 0545 06/20/23 0201 06/21/23 0002  AST 1,185* 374* 192*  ALT 783* 530* 359*  ALKPHOS 229* 238* 220*  BILITOT 1.1 1.7* 1.3*  PROT 7.0 6.1* 6.7  ALBUMIN 2.9* 2.4* 2.6*   Recent Labs  Lab 06/19/23 0545  LIPASE 44   No results for input(s): "AMMONIA" in the last 168 hours.  Coagulation Profile: Recent Labs  Lab 06/19/23 0746  INR 1.1    Cardiac Enzymes: No results for input(s): "CKTOTAL", "CKMB", "CKMBINDEX", "TROPONINI" in the last 168 hours.  BNP (last 3 results) No results for input(s): "PROBNP" in the last 8760 hours.  Lipid Profile: No results for input(s): "CHOL", "HDL", "LDLCALC", "TRIG", "CHOLHDL", "LDLDIRECT" in the last 72 hours.  Thyroid  Function Tests: No results for input(s): "TSH", "T4TOTAL", "FREET4", "T3FREE", "THYROIDAB" in the last 72 hours.  Anemia Panel: No results for input(s): "VITAMINB12", "FOLATE", "FERRITIN", "TIBC", "IRON", "RETICCTPCT" in the last 72 hours.  Urine analysis:    Component Value Date/Time   COLORURINE YELLOW 06/07/2023 0324   APPEARANCEUR CLEAR 06/07/2023 0324   LABSPEC 1.014 06/07/2023 0324   PHURINE 7.0 06/07/2023 0324   GLUCOSEU NEGATIVE 06/07/2023 0324   HGBUR NEGATIVE 06/07/2023 0324   BILIRUBINUR NEGATIVE 06/07/2023 0324   KETONESUR NEGATIVE 06/07/2023 0324   PROTEINUR 100 (A) 06/07/2023 0324   UROBILINOGEN 0.2  04/22/2013 2301   NITRITE NEGATIVE 06/07/2023 0324   LEUKOCYTESUR NEGATIVE 06/07/2023 0324    Sepsis Labs: Lactic Acid, Venous    Component Value Date/Time   LATICACIDVEN 1.5 04/04/2012 1528    MICROBIOLOGY: Recent Results (from the past 240 hour(s))  Resp panel by RT-PCR (RSV, Flu A&B, Covid) Anterior Nasal Swab     Status: None   Collection Time: 06/19/23  7:07 AM   Specimen: Anterior Nasal Swab  Result Value Ref Range Status   SARS Coronavirus 2 by RT PCR NEGATIVE NEGATIVE Final   Influenza A by PCR NEGATIVE NEGATIVE Final   Influenza B by PCR NEGATIVE NEGATIVE Final    Comment: (NOTE) The Xpert Xpress SARS-CoV-2/FLU/RSV plus assay is intended as an aid in the diagnosis of influenza from Nasopharyngeal swab specimens and should not be used as a sole basis for treatment. Nasal washings and aspirates are unacceptable for Xpert Xpress SARS-CoV-2/FLU/RSV testing.  Fact Sheet for Patients: BloggerCourse.com  Fact Sheet for Healthcare Providers: SeriousBroker.it  This test is not yet approved or cleared by the [place] FDA and has been authorized for detection and/or diagnosis of SARS-CoV-2 by FDA under an Emergency Use Authorization (EUA). This EUA will remain in effect (meaning this test can be used)  for the duration of the COVID-19 declaration under Section 564(b)(1) of the Act, 21 U.S.C. section 360bbb-3(b)(1), unless the authorization is terminated or revoked.     Resp Syncytial Virus by PCR NEGATIVE NEGATIVE Final    Comment: (NOTE) Fact Sheet for Patients: BloggerCourse.com  Fact Sheet for Healthcare Providers: SeriousBroker.it  This test is not yet approved or cleared by the [place] FDA and has been authorized for detection and/or diagnosis of SARS-CoV-2 by FDA under an Emergency Use Authorization (EUA). This EUA will remain in effect (meaning this test can be used) for the duration of the COVID-19 declaration under Section 564(b)(1) of the Act, 21 U.S.C. section 360bbb-3(b)(1), unless the authorization is terminated or revoked.  Performed at [facility], [address], [place], [place] [address]     RADIOLOGY STUDIES/RESULTS: ECHOCARDIOGRAM COMPLETE  Result Date: 06/19/2023    ECHOCARDIOGRAM REPORT   Patient Name:   Ryan Jimenez Date of Exam: 06/19/2023 Medical Rec #:  [id number]        Height:       69.0 in Accession #:    [id number]       Weight:       198.0 lb Date of Birth:  [birth year]        BSA:          2.057 m Patient Age:    89 years         BP:           160/62 mmHg Patient Gender: M                HR:           65 bpm. Exam Location:  Inpatient Procedure: 2D Echo, Color Doppler, Cardiac Doppler and Intracardiac            Opacification Agent Indications:    Abnormal ECG                 Congestive Heart Failure  History:        Patient has prior history of Echocardiogram examinations, most                 recent 05/17/2020. Signs/Symptoms:Dyspnea and Shortness of Breath;  Risk Factors:Diabetes, Dyslipidemia and Hypertension.  Sonographer:    Raeford Razor Referring Phys: Ernestene Kiel ORTIZ  Sonographer Comments: Technically difficult study due to poor echo windows. Image acquisition challenging  due to patient body habitus. IMPRESSIONS  1. Left ventricular ejection fraction, by estimation, is 60 to 65%. The left ventricle has normal function. The left ventricle has no regional wall motion abnormalities. Left ventricular diastolic parameters are consistent with Grade I diastolic dysfunction (impaired relaxation).  2. Right ventricular systolic function is normal. The right ventricular size is normal. Tricuspid regurgitation signal is inadequate for assessing PA pressure.  3. The mitral valve is normal in structure. No evidence of mitral valve regurgitation.  4. The aortic valve is tricuspid. Aortic valve regurgitation is not visualized.  5. The inferior vena cava is normal in size with greater than 50% respiratory variability, suggesting right atrial pressure of 3 mmHg. FINDINGS  Left Ventricle: Left ventricular ejection fraction, by estimation, is 60 to 65%. The left ventricle has normal function. The left ventricle has no regional wall motion abnormalities. Definity contrast agent was given IV to delineate the left ventricular  endocardial borders. The left ventricular internal cavity size was normal in size. There is no left ventricular hypertrophy. Left ventricular diastolic parameters are consistent with Grade I diastolic dysfunction (impaired relaxation). Right Ventricle: The right ventricular size is normal. Right ventricular systolic function is normal. Tricuspid regurgitation signal is inadequate for assessing PA pressure. Left Atrium: Left atrial size was normal in size. Right Atrium: Right atrial size was normal in size. Pericardium: There is no evidence of pericardial effusion. Mitral Valve: The mitral valve is normal in structure. No evidence of mitral valve regurgitation. Tricuspid Valve: Tricuspid valve regurgitation is not demonstrated. Aortic Valve: The aortic valve is tricuspid. Aortic valve regurgitation is not visualized. Aortic valve peak gradient measures 6.9 mmHg. Pulmonic Valve: The  pulmonic valve was normal in structure. Pulmonic valve regurgitation is not visualized. Aorta: The aortic root and ascending aorta are structurally normal, with no evidence of dilitation. Venous: The inferior vena cava is normal in size with greater than 50% respiratory variability, suggesting right atrial pressure of 3 mmHg. IAS/Shunts: No atrial level shunt detected by color flow Doppler.  LEFT VENTRICLE PLAX 2D LVIDd:         5.50 cm   Diastology LVIDs:         3.60 cm   LV e' medial:    4.35 cm/s LV PW:         0.90 cm   LV E/e' medial:  16.7 LV IVS:        0.90 cm   LV e' lateral:   9.46 cm/s LVOT diam:     2.20 cm   LV E/e' lateral: 7.7 LV SV:         88 LV SV Index:   43 LVOT Area:     3.80 cm  RIGHT VENTRICLE             IVC RV Basal diam:  2.90 cm     IVC diam: 1.30 cm RV S prime:     12.00 cm/s TAPSE (M-mode): 2.3 cm LEFT ATRIUM             Index        RIGHT ATRIUM           Index LA diam:        3.60 cm 1.75 cm/m   RA Area:     13.00 cm LA Vol (  A2C):   47.3 ml 22.99 ml/m  RA Volume:   29.20 ml  14.19 ml/m LA Vol (A4C):   67.8 ml 32.96 ml/m LA Biplane Vol: 60.0 ml 29.17 ml/m  AORTIC VALVE AV Area (Vmax): 3.31 cm AV Vmax:        131.00 cm/s AV Peak Grad:   6.9 mmHg LVOT Vmax:      114.00 cm/s LVOT Vmean:     69.400 cm/s LVOT VTI:       0.231 m  AORTA Ao Root diam: 3.20 cm Ao Asc diam:  3.60 cm MITRAL VALVE MV Area (PHT): 2.91 cm    SHUNTS MV Decel Time: 261 msec    Systemic VTI:  0.23 m MV E velocity: 72.50 cm/s  Systemic Diam: 2.20 cm MV A velocity: 92.20 cm/s MV E/A ratio:  0.79 Photographer signed by Carolan Clines Signature Date/Time: 06/19/2023/5:53:14 PM    Final      LOS: 2 days   Jeoffrey Massed, MD  [facility]    To contact the attending provider between 7A-7P or the covering provider during after hours 7P-7A, please log into the web site [email or web address] and access using universal [place] password for that web site. If you do not have the password, please call  the hospital operator.  06/21/2023, 1:17 PM

## 2023-06-21 NOTE — Evaluation (Signed)
Occupational Therapy Evaluation Patient Details Name: Ryan Jimenez MRN: [id number] DOB: 08-15-1932 Today's Date: 06/21/2023   History of Present Illness 89 y.o. male admitted  8/5 with acute cholecystitis. S/p  LAPAROSCOPIC CHOLECYSTECTOMY 8/6. PMHx: hypertension, diabetes, ESRD dialysis M,W,F.   Clinical Impression   At baseline, pt completes ADLs Independent to Mod I and functional transfers/mobility Mod I with a SPC for short distances and a Rollator or scooter for longer distances. At baseline, pt receives assistance from family for meal prep and some home management tasks. Pt now presents with decreased activity tolerance, generalized B UE strength, decreased dynamic standing balance, and decreased safety and independence with ADLs and functional transfers/mobility. Pt currently demonstrates ability to complete UB ADLs Independent to Set up in sitting, LB ADLs with Min to Mod assist, and functional mobility/transfers with a RW with Contact guard assist. Pt with VSS on 2L continuous O2 through nasal cannula throughout session. Pt will benefit from acute skilled OT services to address deficits outlined below and increase safety and independence with ADLs, functional transfers, and functional mobility. Post acute discharge, pt will benefit from continued skilled OT services in the home to maximize rehab potential.       If plan is discharge home, recommend the following: A little help with walking and/or transfers;A little help with bathing/dressing/bathroom;Assistance with cooking/housework;Assist for transportation;Help with stairs or ramp for entrance    Functional Status Assessment  Patient has had a recent decline in their functional status and demonstrates the ability to make significant improvements in function in a reasonable and predictable amount of time.  Equipment Recommendations  None recommended by OT    Recommendations for Other Services       Precautions / Restrictions  Precautions Precautions: Fall Precaution Comments: Monitor O2 Restrictions Weight Bearing Restrictions: No      Mobility Bed Mobility Overal bed mobility: Modified Independent             General bed mobility comments: Increased time.Cues for hand placement/technique.    Transfers Overall transfer level: Needs assistance Equipment used: Rolling walker (2 wheels), Straight cane Transfers: Sit to/from Stand Sit to Stand: Contact guard assist           General transfer comment: CGA for sit to stand with RW      Balance Overall balance assessment: Needs assistance Sitting-balance support: No upper extremity supported, Feet supported Sitting balance-Leahy Scale: Good     Standing balance support: Single extremity supported, Reliant on assistive device for balance, During functional activity Standing balance-Leahy Scale: Poor                             ADL either performed or assessed with clinical judgement   ADL Overall ADL's : Needs assistance/impaired Eating/Feeding: Independent;Sitting   Grooming: Modified independent;Sitting   Upper Body Bathing: Supervision/ safety;Sitting   Lower Body Bathing: Minimal assistance;Cueing for compensatory techniques;Sit to/from stand;Sitting/lateral leans   Upper Body Dressing : Set up;Sitting   Lower Body Dressing: Moderate assistance;Sit to/from stand;Sitting/lateral leans   Toilet Transfer: Contact guard assist;BSC/3in1;Rolling walker (2 wheels) (Step-pivot transfer)   Toileting- Clothing Manipulation and Hygiene: Minimal assistance;Cueing for compensatory techniques;Sit to/from stand;Sitting/lateral lean         General ADL Comments: Pt with decreased activity tolernace during funcitonal tasks. Pt will benefit from training in use of AE for increased safety and independence tih LB dressing/bathing. Pt decined funcitonal mobility this session secondary to fatigue.  Vision Baseline Vision/History: 1  Wears glasses;4 Cataracts (Hx of B cataracts with subsequent surgery) Ability to See in Adequate Light: 0 Adequate (with glasses) Patient Visual Report: No change from baseline       Perception         Praxis         Pertinent Vitals/Pain Pain Assessment Pain Assessment: No/denies pain (Pt reported abdominal pain earlier in the day that has subsided at this time.) Pain Intervention(s): Monitored during session     Extremity/Trunk Assessment Upper Extremity Assessment Upper Extremity Assessment: Generalized weakness   Lower Extremity Assessment Lower Extremity Assessment: Defer to PT evaluation       Communication Communication Communication: No apparent difficulties   Cognition Arousal: Alert Behavior During Therapy: WFL for tasks assessed/performed Overall Cognitive Status: Within Functional Limits for tasks assessed                                 General Comments: Pt AAOx4 and pleasant throughout session.     General Comments  VSS on 2L conitnuous O2 through nasal cannula throughout session. Pt reported mild dizziness upon sitting/standing but with BP remaining stable.    Exercises     Shoulder Instructions      Home Living Family/patient expects to be discharged to:: Private residence Living Arrangements: Children;Other relatives (Daughter coming from [place] to stay with him, Brother has been staying with him) Available Help at Discharge: Family;Available 24 hours/day Type of Home: House Home Access: Stairs to enter [employer] of Steps: 2 Entrance Stairs-Rails: None Home Layout: One level     Bathroom Shower/Tub: Chief Strategy Officer: Standard Bathroom Accessibility: No   Home Equipment: Chartered certified accountant - single point;Educational psychologist (4 wheels)   Additional Comments: Pt has a house in [place] and in [place]. Pt will discharge to home in [place], but plans to return to [place] in September.      Prior  Functioning/Environment Prior Level of Function : Needs assist             Mobility Comments: SPC in home, short distance outside. Rollator and motor scooter for longer distances.  Driving. ADLs Comments: Pt Independent to Mod I with ADLs. Pt receives assistance from family for meal prep and some home management tasks. Pt is activity at his church in [place].        OT Problem List: Decreased strength;Decreased activity tolerance;Impaired balance (sitting and/or standing);Decreased knowledge of use of DME or AE      OT Treatment/Interventions: Self-care/ADL training;Therapeutic exercise;Energy conservation;DME and/or AE instruction;Therapeutic activities;Patient/family education;Balance training    OT Goals(Current goals can be found in the care plan section) Acute Rehab OT Goals Patient Stated Goal: To return home with assistance of family as needed OT Goal Formulation: With patient Time For Goal Achievement: 07/05/23 Potential to Achieve Goals: Good ADL Goals Pt Will Perform Grooming: standing;with supervision Pt Will Perform Lower Body Bathing: with supervision;with adaptive equipment;sitting/lateral leans;sit to/from stand Pt Will Perform Lower Body Dressing: with supervision;with adaptive equipment;sitting/lateral leans;sit to/from stand Pt/caregiver will Perform Home Exercise Program: Increased strength;Both right and left upper extremity;With theraband;With theraputty;With Supervision;With written HEP provided (Increased activity tolerance) Additional ADL Goal #1: Patient will demonstrate ability to Independently state 4 energy conservation techniques for increased safety and indpendence with ADLs and functional mobility in the home.  OT Frequency: Min 1X/week    Co-evaluation  AM-PAC OT "6 Clicks" Daily Activity     Outcome Measure Help from another person eating meals?: None Help from another person taking care of personal grooming?: None Help from another  person toileting, which includes using toliet, bedpan, or urinal?: A Little Help from another person bathing (including washing, rinsing, drying)?: A Little Help from another person to put on and taking off regular upper body clothing?: A Little Help from another person to put on and taking off regular lower body clothing?: A Little 6 Click Score: 20   End of Session Equipment Utilized During Treatment: Rolling walker (2 wheels);Oxygen Nurse Communication: Mobility status  Activity Tolerance: Patient tolerated treatment well;Other (comment) Patient left: in bed;with call bell/phone within reach;with bed alarm set  OT Visit Diagnosis: Muscle weakness (generalized) (M62.81);Other (comment);Unsteadiness on feet (R26.81) (Decreased activity tolerance)                Time: 1018-1040 OT Time Calculation (min): 22 min Charges:  OT General Charges $OT Visit: 1 Visit OT Evaluation $OT Eval Low Complexity: 1 Low  [address]" M., OTR/L, MA Acute Rehab [phone]   Lendon Colonel 06/21/2023, 12:00 PM

## 2023-06-21 NOTE — Progress Notes (Signed)
Progress Note  1 Day Post-Op  Subjective: Pt reports he is sore but overall feeling better. Denies nausea or vomiting. Tolerating liquids and diet has already been advanced this AM. Passing flatus.   Objective: Vital signs in last 24 hours: Temp:  [97.6 F (36.4 C)-98.7 F (37.1 C)] 98.2 F (36.8 C) (08/07 0800) Pulse Rate:  [56-74] 65 (08/07 0800) Resp:  [17-20] 17 (08/07 0800) BP: (104-142)/(42-59) 104/59 (08/07 0800) SpO2:  [93 %-100 %] 99 % (08/07 0800) Weight:  [93.4 kg-94 kg] 94 kg (08/06 1352) Last BM Date : 06/18/23  Intake/Output from previous day: 08/06 0701 - 08/07 0700 In: 900 [I.V.:500; IV Piggyback:400] Out: 13250.7 [Blood:25] Intake/Output this shift: Total I/O In: 240 [P.O.:240] Out: -   PE: General: pleasant, WD, WN male who is laying in bed in NAD HEENT: sclera anicteric Lungs: Respiratory effort nonlabored Abd: soft, appropriately ttp, mild distention, incisions C/D/I Psych: A&Ox3 with an appropriate affect.    Lab Results:  Recent Labs    06/20/23 0201 06/20/23 1408 06/21/23 0002  WBC 6.9  --  8.9  HGB 9.9* 12.6* 10.2*  HCT 30.8* 37.0* 32.4*  PLT 188  --  226   BMET Recent Labs    06/20/23 0201 06/20/23 1408 06/21/23 0002  NA 135 135 133*  K 4.2 4.1 4.2  CL 91* 95* 92*  CO2 29  --  28  GLUCOSE 120* 105* 165*  BUN 45* 25* 26*  CREATININE 8.21* 5.40* 5.80*  CALCIUM 8.1*  --  8.2*   PT/INR Recent Labs    06/19/23 0746  LABPROT 14.6  INR 1.1   CMP     Component Value Date/Time   NA 133 (L) 06/21/2023 0002   K 4.2 06/21/2023 0002   CL 92 (L) 06/21/2023 0002   CO2 28 06/21/2023 0002   GLUCOSE 165 (H) 06/21/2023 0002   BUN 26 (H) 06/21/2023 0002   CREATININE 5.80 (H) 06/21/2023 0002   CALCIUM 8.2 (L) 06/21/2023 0002   PROT 6.7 06/21/2023 0002   ALBUMIN 2.6 (L) 06/21/2023 0002   AST 192 (H) 06/21/2023 0002   ALT 359 (H) 06/21/2023 0002   ALKPHOS 220 (H) 06/21/2023 0002   BILITOT 1.3 (H) 06/21/2023 0002   GFRNONAA 9  (L) 06/21/2023 0002   GFRAA 11 (L) 05/20/2020 0322   Lipase     Component Value Date/Time   LIPASE 44 06/19/2023 0545       Studies/Results: ECHOCARDIOGRAM COMPLETE  Result Date: 06/19/2023    ECHOCARDIOGRAM REPORT   Patient Name:   Ryan Jimenez Date of Exam: 06/19/2023 Medical Rec #:  [id number]        Height:       69.0 in Accession #:    [id number]       Weight:       198.0 lb Date of Birth:  1932-04-04        BSA:          2.057 m Patient Age:    89 years         BP:           160/62 mmHg Patient Gender: M                HR:           65 bpm. Exam Location:  Inpatient Procedure: 2D Echo, Color Doppler, Cardiac Doppler and Intracardiac            Opacification Agent Indications:  Abnormal ECG                 Congestive Heart Failure  History:        Patient has prior history of Echocardiogram examinations, most                 recent 05/17/2020. Signs/Symptoms:Dyspnea and Shortness of Breath;                 Risk Factors:Diabetes, Dyslipidemia and Hypertension.  Sonographer:    Raeford Razor Referring Phys: Ernestene Kiel ORTIZ  Sonographer Comments: Technically difficult study due to poor echo windows. Image acquisition challenging due to patient body habitus. IMPRESSIONS  1. Left ventricular ejection fraction, by estimation, is 60 to 65%. The left ventricle has normal function. The left ventricle has no regional wall motion abnormalities. Left ventricular diastolic parameters are consistent with Grade I diastolic dysfunction (impaired relaxation).  2. Right ventricular systolic function is normal. The right ventricular size is normal. Tricuspid regurgitation signal is inadequate for assessing PA pressure.  3. The mitral valve is normal in structure. No evidence of mitral valve regurgitation.  4. The aortic valve is tricuspid. Aortic valve regurgitation is not visualized.  5. The inferior vena cava is normal in size with greater than 50% respiratory variability, suggesting right atrial pressure of 3  mmHg. FINDINGS  Left Ventricle: Left ventricular ejection fraction, by estimation, is 60 to 65%. The left ventricle has normal function. The left ventricle has no regional wall motion abnormalities. Definity contrast agent was given IV to delineate the left ventricular  endocardial borders. The left ventricular internal cavity size was normal in size. There is no left ventricular hypertrophy. Left ventricular diastolic parameters are consistent with Grade I diastolic dysfunction (impaired relaxation). Right Ventricle: The right ventricular size is normal. Right ventricular systolic function is normal. Tricuspid regurgitation signal is inadequate for assessing PA pressure. Left Atrium: Left atrial size was normal in size. Right Atrium: Right atrial size was normal in size. Pericardium: There is no evidence of pericardial effusion. Mitral Valve: The mitral valve is normal in structure. No evidence of mitral valve regurgitation. Tricuspid Valve: Tricuspid valve regurgitation is not demonstrated. Aortic Valve: The aortic valve is tricuspid. Aortic valve regurgitation is not visualized. Aortic valve peak gradient measures 6.9 mmHg. Pulmonic Valve: The pulmonic valve was normal in structure. Pulmonic valve regurgitation is not visualized. Aorta: The aortic root and ascending aorta are structurally normal, with no evidence of dilitation. Venous: The inferior vena cava is normal in size with greater than 50% respiratory variability, suggesting right atrial pressure of 3 mmHg. IAS/Shunts: No atrial level shunt detected by color flow Doppler.  LEFT VENTRICLE PLAX 2D LVIDd:         5.50 cm   Diastology LVIDs:         3.60 cm   LV e' medial:    4.35 cm/s LV PW:         0.90 cm   LV E/e' medial:  16.7 LV IVS:        0.90 cm   LV e' lateral:   9.46 cm/s LVOT diam:     2.20 cm   LV E/e' lateral: 7.7 LV SV:         88 LV SV Index:   43 LVOT Area:     3.80 cm  RIGHT VENTRICLE             IVC RV Basal diam:  2.90 cm     IVC  diam:  1.30 cm RV S prime:     12.00 cm/s TAPSE (M-mode): 2.3 cm LEFT ATRIUM             Index        RIGHT ATRIUM           Index LA diam:        3.60 cm 1.75 cm/m   RA Area:     13.00 cm LA Vol (A2C):   47.3 ml 22.99 ml/m  RA Volume:   29.20 ml  14.19 ml/m LA Vol (A4C):   67.8 ml 32.96 ml/m LA Biplane Vol: 60.0 ml 29.17 ml/m  AORTIC VALVE AV Area (Vmax): 3.31 cm AV Vmax:        131.00 cm/s AV Peak Grad:   6.9 mmHg LVOT Vmax:      114.00 cm/s LVOT Vmean:     69.400 cm/s LVOT VTI:       0.231 m  AORTA Ao Root diam: 3.20 cm Ao Asc diam:  3.60 cm MITRAL VALVE MV Area (PHT): 2.91 cm    SHUNTS MV Decel Time: 261 msec    Systemic VTI:  0.23 m MV E velocity: 72.50 cm/s  Systemic Diam: 2.20 cm MV A velocity: 92.20 cm/s MV E/A ratio:  0.79 [place] signed by Carolan Clines Signature Date/Time: 06/19/2023/5:53:14 PM    Final     Anti-infectives: Anti-infectives (From admission, onward)    Start     Dose/Rate Route Frequency Ordered Stop   06/20/23 2200  piperacillin-tazobactam (ZOSYN) IVPB 2.25 g        2.25 g 100 mL/hr over 30 Minutes Intravenous Every 8 hours 06/20/23 1645 06/24/23 2159   06/19/23 1400  piperacillin-tazobactam (ZOSYN) IVPB 2.25 g  Status:  Discontinued        2.25 g 100 mL/hr over 30 Minutes Intravenous Every 8 hours 06/19/23 1004 06/20/23 1645   06/19/23 0800  piperacillin-tazobactam (ZOSYN) IVPB 3.375 g        3.375 g 100 mL/hr over 30 Minutes Intravenous  Once 06/19/23 0755 06/19/23 0941        Assessment/Plan  POD1 s/p laparoscopic cholecystectomy   - pt tolerating liquids and advancing to renal/CM diet this AM - LFTs trending down, unable to get IOC in OR, repeat tomorrow AM as well - mobilize as tolerated - continue zosyn while admitted and will confirm with MD if PO abx recommended on DC - will work on arranging follow up prior to when patient goes back to [place] 9/6  FEN: renal/CM diet, IVF per TRH/renal VTE: SCDs, ok to have SQH from surgery standpoint  ID:  Zosyn   - per TRH -  ESRD on HD MWF HTN HLD T2DM Hx of RBBB - last ECHO in 05/2023 with grade I diastolic dysfunction and EF 60-65% DOE Gout Anemia of CKD  LOS: 2 days     Juliet Rude, [facility] Surgery 06/21/2023, 9:53 AM Please see Amion for pager number during day hours 7:00am-4:30pm

## 2023-06-21 NOTE — Progress Notes (Signed)
   06/21/23 1612  TOC Brief Assessment  Insurance and Status Reviewed (Medicare A and B)  Patient has primary care physician Yes (Busy - Default status  Avbuere, [name], MD)  Home environment has been reviewed From Home  Prior level of function: somewhat independent  Prior/Current Home Services No current home services  Social Determinants of Health Reivew SDOH reviewed no interventions necessary  Readmission risk has been reviewed Yes (22%)  Transition of care needs transition of care needs identified, TOC will continue to follow

## 2023-06-21 NOTE — Progress Notes (Signed)
[facility] Progress Note  Subjective: seen in room, went to OR yest for lap chole.   Vitals:   06/20/23 2335 06/21/23 0516 06/21/23 0800 06/21/23 1138  BP: (!) 117/42 (!) 121/49 (!) 104/59 (!) 132/58  Pulse: (!) 57 74 65 (!) 58  Resp: 18 18 17 14   Temp: 98.6 F (37 C) 98.7 F (37.1 C) 98.2 F (36.8 C) 98.4 F (36.9 C)  TempSrc: Oral Oral Oral Oral  SpO2: 99% 99% 99% 99%  Weight:      Height:        Exam: Gen alert, no distress, [place] O2 No jvd or bruits Chest clear bilat to bases RRR no MRG Abd soft ntnd no mass or ascites +bs Ext no LE edema Neuro is alert, Ox 3 , nf    RUA AVF+bruit    Home meds include - albuterol, allopruinol, amlodipine 10, aspirin, benzonatate, carvedilol 12.5 bid, ceterizine, colchicine prn, dutasteride, fluticasone, furosemide 80 bid, hydralazine 25 bid, insulin aspart/ levemir, mirtazapine, MVI, norel prn, omeprazole, percocet, rosvastatin, tamsulosin, eye drops      OP HD: - MWF G-O  4h   400/600   95.5kg   2/2.5 bath  AVF  Heparin 3000 - last HD 8/02, post wt 95kg   - venofer 50mg  IV weekly - rocaltrol 0.5 mcg po three times per week - sensipar 30mg  po three times per week       CXR - small effusions at bases vs atx vs other   Assessment/ Plan: Abdominal pain/ ^LFT"S - pt has acute cholecystitis, sp lap chole on 8/06 yesterday. Per pmd/ gen surg.  ESRD - on HD MWF.  Next HD Thursday.  HTN/ volume - Bp's stable, holding home HTN meds. No vol overload on exam, under 1kg.   Anemia esrd - Hb 10.8, no esa needs at this time.  MBD ckd - CCa and phos are in range. Follow.  DM2 - on insulin , per pmd     Vinson Moselle MD  [facility] 06/21/2023, 3:19 PM  Recent Labs  Lab 06/19/23 0545 06/20/23 0201 06/20/23 1408 06/21/23 0002  HGB 10.8* 9.9* 12.6* 10.2*  ALBUMIN 2.9* 2.4*  --  2.6*  CALCIUM 8.4* 8.1*  --  8.2*  PHOS 4.6  --   --   --   CREATININE 7.43* 8.21* 5.40* 5.80*  K 4.8 4.2 4.1 4.2   No results for input(s): "IRON",  "TIBC", "FERRITIN" in the last 168 hours. Inpatient medications:  acetaminophen  650 mg Oral Q6H   Or   acetaminophen  650 mg Rectal Q6H   allopurinol  100 mg Oral Daily   carvedilol  12.5 mg Oral BID WC   Chlorhexidine Gluconate Cloth  6 each Topical Q0600   insulin aspart  0-6 Units Subcutaneous TID WC   pantoprazole  40 mg Oral Q1200    piperacillin-tazobactam (ZOSYN)  IV 2.25 g (06/21/23 1442)   colchicine, fentaNYL (SUBLIMAZE) injection, hydrALAZINE, ondansetron **OR** ondansetron (ZOFRAN) IV, traMADol

## 2023-06-21 NOTE — TOC Progression Note (Signed)
Transition of Care [facility]) - Progression Note    Patient Details  Name: Ryan Jimenez MRN: [id number] Date of Birth: 02/03/1932  Transition of Care [facility]) CM/SW Contact  Gordy Clement, RN Phone Number: 06/21/2023, 4:15 PM  Clinical Narrative:     Home Health has been recommended  No Preference [facility] has accepted for PT and OT  Rolling Walker ordered through [employer]   TOC will continue to follow patient for any additional discharge needs           Expected Discharge Plan and Services                                               Social Determinants of Health (SDOH) Interventions SDOH Screenings   Food Insecurity: No Food Insecurity (06/19/2023)  Housing: Low Risk  (06/19/2023)  Transportation Needs: No Transportation Needs (06/19/2023)  Utilities: Not At Risk (06/19/2023)  Tobacco Use: Medium Risk (06/20/2023)    Readmission Risk Interventions     No data to display

## 2023-06-22 DIAGNOSIS — N186 End stage renal disease: Secondary | ICD-10-CM | POA: Diagnosis not present

## 2023-06-22 DIAGNOSIS — M109 Gout, unspecified: Secondary | ICD-10-CM | POA: Diagnosis not present

## 2023-06-22 DIAGNOSIS — K810 Acute cholecystitis: Secondary | ICD-10-CM | POA: Diagnosis not present

## 2023-06-22 DIAGNOSIS — E1165 Type 2 diabetes mellitus with hyperglycemia: Secondary | ICD-10-CM | POA: Diagnosis not present

## 2023-06-22 LAB — RENAL FUNCTION PANEL
Albumin: 2.5 g/dL — ABNORMAL LOW (ref 3.5–5.0)
Anion gap: 16 — ABNORMAL HIGH (ref 5–15)
BUN: 38 mg/dL — ABNORMAL HIGH (ref 8–23)
CO2: 24 mmol/L (ref 22–32)
Calcium: 8 mg/dL — ABNORMAL LOW (ref 8.9–10.3)
Chloride: 88 mmol/L — ABNORMAL LOW (ref 98–111)
Creatinine, Ser: 8.43 mg/dL — ABNORMAL HIGH (ref 0.61–1.24)
GFR, Estimated: 6 mL/min — ABNORMAL LOW (ref >60)
Glucose, Bld: 167 mg/dL — ABNORMAL HIGH (ref 70–99)
Phosphorus: 6.1 mg/dL — ABNORMAL HIGH (ref 2.5–4.6)
Potassium: 4.4 mmol/L (ref 3.5–5.1)
Sodium: 128 mmol/L — ABNORMAL LOW (ref 135–145)

## 2023-06-22 LAB — GLUCOSE, CAPILLARY
Glucose-Capillary: 114 mg/dL — ABNORMAL HIGH (ref 70–99)
Glucose-Capillary: 163 mg/dL — ABNORMAL HIGH (ref 70–99)
Glucose-Capillary: 162 mg/dL — ABNORMAL HIGH (ref 70–99)
Glucose-Capillary: 103 mg/dL — ABNORMAL HIGH (ref 70–99)
Glucose-Capillary: 98 mg/dL (ref 70–99)

## 2023-06-22 LAB — CBC
HCT: 32 % — ABNORMAL LOW (ref 39.0–52.0)
Hemoglobin: 10.2 g/dL — ABNORMAL LOW (ref 13.0–17.0)
MCH: 29.5 pg (ref 26.0–34.0)
MCHC: 31.9 g/dL (ref 30.0–36.0)
MCV: 92.5 fL (ref 80.0–100.0)
Platelets: 221 10*3/uL (ref 150–400)
RBC: 3.46 MIL/uL — ABNORMAL LOW (ref 4.22–5.81)
RDW: 13.9 % (ref 11.5–15.5)
WBC: 8 10*3/uL (ref 4.0–10.5)
nRBC: 0 % (ref 0.0–0.2)

## 2023-06-22 MED ORDER — HEPARIN SODIUM (PORCINE) 1000 UNIT/ML DIALYSIS
1500.0000 [IU] | INTRAMUSCULAR | Status: AC | PRN
  Administered 2023-06-22 (×2): 1500 [IU] via INTRAVENOUS_CENTRAL
  Filled 2023-06-22 (×2): qty 2

## 2023-06-22 MED ORDER — AMLODIPINE BESYLATE 10 MG PO TABS
10.0000 mg | ORAL_TABLET | Freq: Every day | ORAL | Status: DC
  Administered 2023-06-22 – 2023-06-23 (×2): 10 mg via ORAL
  Filled 2023-06-22 (×2): qty 1

## 2023-06-22 MED ORDER — AMOXICILLIN-POT CLAVULANATE 500-125 MG PO TABS
1.0000 | ORAL_TABLET | ORAL | Status: DC
  Administered 2023-06-23: 1 via ORAL
  Filled 2023-06-22: qty 1

## 2023-06-22 MED ORDER — HEPARIN SODIUM (PORCINE) 1000 UNIT/ML DIALYSIS: 1500.0000 [IU] | INTRAMUSCULAR | Status: DC | PRN

## 2023-06-22 MED ORDER — PIPERACILLIN-TAZOBACTAM IN DEX 2-0.25 GM/50ML IV SOLN
2.2500 g | Freq: Three times a day (TID) | INTRAVENOUS | Status: AC
  Administered 2023-06-22 (×2): 2.25 g via INTRAVENOUS
  Filled 2023-06-22 (×2): qty 50

## 2023-06-22 NOTE — Progress Notes (Signed)
POST HD TX NOTE  06/22/23 1801  Vitals  Temp 98.3 F (36.8 C)  Temp Source Oral  BP (!) 140/54  MAP (mmHg) 80  BP Location Left Arm  BP Method Automatic  Patient Position (if appropriate) Lying  Pulse Rate 67  Pulse Rate Source Monitor  ECG Heart Rate 68  Resp 17  Oxygen Therapy  SpO2 95 %  O2 Device Room Air  Pulse Oximetry Type Continuous  During Treatment Monitoring  Blood Flow Rate (mL/min) 0 mL/min  Arterial Pressure (mmHg) -1.01 mmHg  Venous Pressure (mmHg) -1.82 mmHg  TMP (mmHg) -49.69 mmHg  Ultrafiltration Rate (mL/min) 554 mL/min  Dialysate Flow Rate (mL/min) 300 ml/min  Intra-Hemodialysis Comments (S)   (post HD tx VS check)  Post Treatment  Dialyzer Clearance Lightly streaked  Duration of HD Treatment -hour(s) 3.25 hour(s)  Hemodialysis Intake (mL) 0 mL  Liters Processed 78  Fluid Removed (mL) 1000.11 mL  Tolerated HD Treatment Yes  Post-Hemodialysis Comments (S)  tx completed w/o problem, UF goal met, blood rinsed back, VSS. Medication Admin: Heparin 1500 units pre tx bolus, Heparin 1500 units mid tx bolus

## 2023-06-22 NOTE — Progress Notes (Signed)
[facility] Progress Note  Subjective: seen in room, in good spirits.   Vitals:   06/22/23 1428 06/22/23 1430 06/22/23 1500 06/22/23 1530  BP: (!) 115/53 (!) 123/57 (!) 129/49 (!) 130/51  Pulse: 60 60 (!) 57 66  Resp: 19 (!) 23 (!) 23 13  Temp:      TempSrc:      SpO2: 92% 92% 95% 96%  Weight:      Height:        Exam: Gen alert, no distress,  O2 No jvd or bruits Chest clear bilat to bases RRR no MRG Abd soft ntnd no mass or ascites +bs Ext no LE edema Neuro is alert, Ox 3 , nf    RUA AVF+bruit    Home meds include - albuterol, allopruinol, amlodipine 10, aspirin, benzonatate, carvedilol 12.5 bid, ceterizine, colchicine prn, dutasteride, fluticasone, furosemide 80 bid, hydralazine 25 bid, insulin aspart/ levemir, mirtazapine, MVI, norel prn, omeprazole, percocet, rosvastatin, tamsulosin, eye drops      OP HD: - MWF G-O  4h   400/600   95.5kg   2/2.5 bath  AVF  Heparin 3000 - last HD 8/02, post wt 95kg   - venofer 50mg  IV weekly - rocaltrol 0.5 mcg po three times per week - sensipar 30mg  po three times per week       CXR - small effusions at bases vs atx vs other   Assessment/ Plan: Abdominal pain/ ^LFT"S - pt has acute cholecystitis, sp lap chole on 8/06 yesterday. Per pmd/ gen surg.  ESRD - on HD MWF.  Last HD here was Tuesday, plan HD today off schedule. He is probable going to be dc'd tomorrow and our SW has set him up for OP HD tomorrow around 11-11:30 AM, pt is aware.  HTN/ volume - Bp's stable. No vol overload on exam, under 1kg.   Anemia esrd - Hb 10.8, no esa needs at this time.  MBD ckd - CCa and phos are in range. Follow.  DM2 - on insulin , per pmd     Vinson Moselle MD  [facility] 06/22/2023, 3:39 PM  Recent Labs  Lab 06/19/23 0545 06/20/23 0201 06/21/23 0002 06/21/23 2344 06/22/23 1031  HGB 10.8*   < > 10.2*  --  10.2*  ALBUMIN 2.9*   < > 2.6* 2.4* 2.5*  CALCIUM 8.4*   < > 8.2* 7.9* 8.0*  PHOS 4.6  --   --   --  6.1*  CREATININE 7.43*    < > 5.80* 7.74* 8.43*  K 4.8   < > 4.2 4.3 4.4   < > = values in this interval not displayed.   No results for input(s): "IRON", "TIBC", "FERRITIN" in the last 168 hours. Inpatient medications:  acetaminophen  650 mg Oral Q6H   Or   acetaminophen  650 mg Rectal Q6H   allopurinol  100 mg Oral Daily   amLODipine  10 mg Oral Daily   [START ON 06/23/2023] amoxicillin-clavulanate  1 tablet Oral Q24H   carvedilol  12.5 mg Oral BID WC   Chlorhexidine Gluconate Cloth  6 each Topical Q0600   insulin aspart  0-6 Units Subcutaneous TID WC   pantoprazole  40 mg Oral Q1200    piperacillin-tazobactam (ZOSYN)  IV     colchicine, fentaNYL (SUBLIMAZE) injection, heparin, hydrALAZINE, ondansetron **OR** ondansetron (ZOFRAN) IV, traMADol

## 2023-06-22 NOTE — Progress Notes (Signed)
Contacted by attending and nephrologist regarding plans for pt to d/c tomorrow am. Pt's normal HD day is tomorrow as well. Pt receives out-pt HD at [facility] on MWF 12:00 chair time. Clinic can treat pt on normal schedule tomorrow. Met with pt and pt's daughter at bedside. Discussed plan for early d/c tomorrow. Inquired if family would be able transport pt from hospital to clinic for HD treatment. Daughter agreeable to transport pt at d/c to clinic. Update provided to attending, nephrologist, and RN CM. Pt's daughter requesting community resources for transportation to appts. RN CM made aware of daughter's request. Will assist as needed.   Olivia Canter Renal Navigator [phone]

## 2023-06-22 NOTE — Care Management Important Message (Signed)
Important Message  Patient Details  Name: Ryan Jimenez MRN: [id number] Date of Birth: [birth year]   Medicare Important Message Given:  Yes     Dorena Bodo 06/22/2023, 3:06 PM

## 2023-06-22 NOTE — TOC Progression Note (Signed)
Transition of Care [facility]) - Progression Note    Patient Details  Name: Ryan Jimenez MRN: [id number] Date of Birth: 04/07/1932  [facility]) CM/SW Contact  Gordy Clement, RN Phone Number: 06/22/2023, 2:39 PM  Clinical Narrative:     CM met with patient and Daughter, bedside to discuss transportation options.CM has completed initial registration 2 part application for patient to [employer] [place]. Patient and Daughter understand that the process can take up to a few weeks and that there needs to be backup transportation RNCM has given additional resources for transportation in [place] area. Daughter also mentioned using [employer] in the past for her Father, and that she "can always do that"     TOC will continue to follow patient for any additional discharge needs           Expected Discharge Plan and Services                                               Social Determinants of Health (SDOH) Interventions SDOH Screenings   Food Insecurity: No Food Insecurity (06/19/2023)  Housing: Low Risk  (06/19/2023)  Transportation Needs: No Transportation Needs (06/19/2023)  Utilities: Not At Risk (06/19/2023)  Tobacco Use: Medium Risk (06/20/2023)    Readmission Risk Interventions     No data to display

## 2023-06-22 NOTE — Progress Notes (Signed)
Physical Therapy Treatment Patient Details Name: Ryan Jimenez MRN: [id number] DOB: July 09, 1932 Today's Date: 06/22/2023   History of Present Illness 89 y.o. male admitted  8/5 with acute cholecystitis. S/p  LAPAROSCOPIC CHOLECYSTECTOMY 8/6. PMHx: hypertension, diabetes, ESRD dialysis M,W,F.    PT Comments  Patient requires frequent cuing for correct use of RW during transfers and gait (hand placement, upright posture, proximity to RW). Patient instructed to determine when he needed to turn around to walk back to his room as he normally does not walk long distances (uses his electric scooter). He walked 60 ft, then turned around and was very fatigued by end of the walk--rushing to get to the bed to sit down. Feel pt is mobilizing well enough to return home with daughter and brother's assist.     If plan is discharge home, recommend the following: A little help with walking and/or transfers;A little help with bathing/dressing/bathroom;Assistance with cooking/housework;Assist for transportation;Help with stairs or ramp for entrance   Can travel by private vehicle        Equipment Recommendations  Rolling walker (2 wheels)    Recommendations for Other Services       Precautions / Restrictions Precautions Precautions: Fall Precaution Comments: Monitor O2 Restrictions Weight Bearing Restrictions: No     Mobility  Bed Mobility Overal bed mobility: Modified Independent             General bed mobility comments: HOB flat; +rails; Increased time.Despite cues for technique, pt pulled up from supine instead of rolling on his side and then up    Transfers Overall transfer level: Needs assistance Equipment used: Rolling walker (2 wheels), Straight cane Transfers: Sit to/from Stand Sit to Stand: Contact guard assist           General transfer comment: CGA for sit to stand with RW; attempted with hands on bed and pt unsuccessful and immediately put one hand up on RW and one on  bed with success. Educated pt on risk of RW moving as he comes to stand    Ambulation/Gait Ambulation/Gait assistance: Contact guard assist Gait Distance (Feet): 120 Feet Assistive device: Rolling walker (2 wheels) Gait Pattern/deviations: Step-through pattern, Decreased stride length, Wide base of support, Trunk flexed Gait velocity: decr Gait velocity interpretation: 1.31 - 2.62 ft/sec, indicative of limited community ambulator   General Gait Details: Agreed to continued use of RW. He was significantly fatigued on return to room, despite being told he could turn around when he needed to. Admits he walked a lot farther than he normally does because he uses his motorized Community education officer Bed    Modified Rankin (Stroke Patients Only)       Balance Overall balance assessment: Needs assistance Sitting-balance support: No upper extremity supported, Feet supported Sitting balance-Leahy Scale: Good     Standing balance support: Single extremity supported, Reliant on assistive device for balance, During functional activity Standing balance-Leahy Scale: Poor                              Cognition Arousal: Alert Behavior During Therapy: WFL for tasks assessed/performed Overall Cognitive Status: Within Functional Limits for tasks assessed  Exercises      General Comments General comments (skin integrity, edema, etc.): Daughter present      Pertinent Vitals/Pain Pain Assessment Pain Assessment: Faces Faces Pain Scale: Hurts a little bit Pain Location: abdomen Pain Descriptors / Indicators: Operative site guarding, Sore Pain Intervention(s): Limited activity within patient's tolerance, Monitored during session    Home Living                          Prior Function            PT Goals (current goals can now be found in the care plan  section) Acute Rehab PT Goals Patient Stated Goal: Get well, go home PT Goal Formulation: With patient Time For Goal Achievement: 07/05/23 Potential to Achieve Goals: Good Progress towards PT goals: Progressing toward goals    Frequency    Min 1X/week      PT Plan      Co-evaluation              AM-PAC PT "6 Clicks" Mobility   Outcome Measure  Help needed turning from your back to your side while in a flat bed without using bedrails?: None Help needed moving from lying on your back to sitting on the side of a flat bed without using bedrails?: None Help needed moving to and from a bed to a chair (including a wheelchair)?: A Little Help needed standing up from a chair using your arms (e.g., wheelchair or bedside chair)?: A Little Help needed to walk in hospital room?: A Little Help needed climbing 3-5 steps with a railing? : A Little 6 Click Score: 20    End of Session Equipment Utilized During Treatment: Gait belt Activity Tolerance: Patient limited by fatigue Patient left: in bed;with call bell/phone within reach;with bed alarm set (up in chair-like position)   PT Visit Diagnosis: Unsteadiness on feet (R26.81);Other abnormalities of gait and mobility (R26.89);Muscle weakness (generalized) (M62.81);Pain Pain - part of body:  (abdomen)     Time: 1050-1107 PT Time Calculation (min) (ACUTE ONLY): 17 min  Charges:    $Gait Training: 8-22 mins PT General Charges $$ ACUTE PT VISIT: 1 Visit                      Jerolyn Center, PT Acute Rehabilitation Services  Office [phone]    Zena Amos 06/22/2023, 11:18 AM

## 2023-06-22 NOTE — Progress Notes (Signed)
PROGRESS NOTE        PATIENT DETAILS Name: Ryan Jimenez Age: 89 y.o. Sex: male Date of Birth: 12-Jan-1932 Admit Date: 06/19/2023 Admitting Physician No admitting provider for patient encounter. [id number], [name], MD  Brief Summary: Patient is a 89 y.o.  male with a history of ESRD on HD MWF, DM-2, HTN, HLD-who presented with RUQ pain-thought to have acute cholecystitis and subsequently admitted to the hospitalist service.  Significant events: 8/5>> admitted to [facility]  Significant studies: 8/5>> CT abdomen/pelvis: Probable acute cholecystitis 8/5>> RUQ ultrasound: Sludge/equivocal stone gallbladder neck. 8/5>> echo: EF 60-65%, grade 1 diastolic dysfunction.  Significant microbiology data: 8/5>> COVID PCR/influenza/RSV PCR: Negative  Procedures: 8/7>> laparoscopic cholecystectomy  Consults: Nephrology GI General surgery  Subjective: No plaints-some soreness at the operative site-tolerating advancement in diet.  Objective: Vitals: Blood pressure (!) 159/58, pulse 64, temperature 98.9 F (37.2 C), temperature source Oral, resp. rate 18, height 5\' 9"  (1.753 m), weight 100.1 kg, SpO2 91%.   Exam: Gen Exam:Alert awake-not in any distress HEENT:atraumatic, normocephalic Chest: B/L clear to auscultation anteriorly CVS:S1S2 regular Abdomen:soft non tender, non distended Extremities:no edema Neurology: Non focal Skin: no rash  Pertinent Labs/Radiology:    Latest Ref Rng & Units 06/22/2023   10:31 AM 06/21/2023   12:02 AM 06/20/2023    2:08 PM  CBC  WBC 4.0 - 10.5 K/uL 8.0  8.9    Hemoglobin 13.0 - 17.0 g/dL 02.7  25.3  66.4   Hematocrit 39.0 - 52.0 % 32.0  32.4  37.0   Platelets 150 - 400 K/uL 221  226      Lab Results  Component Value Date   NA 131 (L) 06/21/2023   K 4.3 06/21/2023   CL 90 (L) 06/21/2023   CO2 27 06/21/2023      Assessment/Plan: Acute calculus cholecystitis-s/p laparoscopic cholecystectomy on 8/6 Passive flatus-no BM  yet Tolerating advancement in diet Switch from Zosyn to Augmentin Continue to mobilize with PT/OT Appreciate CCS input  ESRD on HD MWF Nephrology following. Will get off scheduled hemodialysis today-and then will resume usual HD tomorrow.  Normocytic anemia Secondary to CKD/ESRD Iron/iron per nephrology  HTN BP creeping up Initially all antihypertensives were held due to n.p.o. status Coreg restarted 8/7-will go ahead and restart amlodipine today Follow BP trend and resume Avodart/hydralazine based on BP trend.  HLD Statin on hold due to transaminitis  DM-2 (A1c 6.4 on 8/5) CBG stable with SSI  Recent Labs    06/21/23 1553 06/21/23 2209 06/22/23 0338  GLUCAP 199* 135* 114*   Gout Stable Allopurinol  Chronic bronchitis Stable Bronchodilators  Obesity: Estimated body mass index is 32.59 kg/m as calculated from the following:   Height as of this encounter: 5\' 9"  (1.753 m).   Weight as of this encounter: 100.1 kg.   Code status:   Code Status: Full Code   DVT Prophylaxis: SCDs Start: 06/19/23 1006   Family Communication: Family member at bedside   Disposition Plan: Status is: Inpatient Remains inpatient appropriate because: Severity of illness   Planned Discharge Destination:Home likely on 8/9.   Diet: Diet Order             Diet renal/carb modified with fluid restriction Diet-HS Snack? Nothing; Fluid restriction: 1200 mL Fluid; Room service appropriate? Yes; Fluid consistency: Thin  Diet effective now  Antimicrobial agents: Anti-infectives (From admission, onward)    Start     Dose/Rate Route Frequency Ordered Stop   06/20/23 2200  piperacillin-tazobactam (ZOSYN) IVPB 2.25 g        2.25 g 100 mL/hr over 30 Minutes Intravenous Every 8 hours 06/20/23 1645 06/24/23 2159   06/19/23 1400  piperacillin-tazobactam (ZOSYN) IVPB 2.25 g  Status:  Discontinued        2.25 g 100 mL/hr over 30 Minutes Intravenous Every 8 hours  06/19/23 1004 06/20/23 1645   06/19/23 0800  piperacillin-tazobactam (ZOSYN) IVPB 3.375 g        3.375 g 100 mL/hr over 30 Minutes Intravenous  Once 06/19/23 0755 06/19/23 0941        MEDICATIONS: Scheduled Meds:  acetaminophen  650 mg Oral Q6H   Or   acetaminophen  650 mg Rectal Q6H   allopurinol  100 mg Oral Daily   carvedilol  12.5 mg Oral BID WC   Chlorhexidine Gluconate Cloth  6 each Topical Q0600   insulin aspart  0-6 Units Subcutaneous TID WC   pantoprazole  40 mg Oral Q1200   Continuous Infusions:  piperacillin-tazobactam (ZOSYN)  IV 2.25 g (06/22/23 0617)   PRN Meds:.colchicine, fentaNYL (SUBLIMAZE) injection, heparin, hydrALAZINE, ondansetron **OR** ondansetron (ZOFRAN) IV, traMADol   I have personally reviewed following labs and imaging studies  LABORATORY DATA: CBC: Recent Labs  Lab 06/19/23 0545 06/20/23 0201 06/20/23 1408 06/21/23 0002 06/22/23 1031  WBC 8.0 6.9  --  8.9 8.0  NEUTROABS 6.5  --   --   --   --   HGB 10.8* 9.9* 12.6* 10.2* 10.2*  HCT 34.6* 30.8* 37.0* 32.4* 32.0*  MCV 93.8 93.3  --  92.8 92.5  PLT 192 188  --  226 221    Basic Metabolic Panel: Recent Labs  Lab 06/19/23 0545 06/20/23 0201 06/20/23 1408 06/21/23 0002 06/21/23 2344  NA 133* 135 135 133* 131*  K 4.8 4.2 4.1 4.2 4.3  CL 92* 91* 95* 92* 90*  CO2 28 29  --  28 27  GLUCOSE 285* 120* 105* 165* 166*  BUN 43* 45* 25* 26* 35*  CREATININE 7.43* 8.21* 5.40* 5.80* 7.74*  CALCIUM 8.4* 8.1*  --  8.2* 7.9*  PHOS 4.6  --   --   --   --     GFR: Estimated Creatinine Clearance: 7.6 mL/min (A) (by C-G formula based on SCr of 7.74 mg/dL (H)).  Liver Function Tests: Recent Labs  Lab 06/19/23 0545 06/20/23 0201 06/21/23 0002 06/21/23 2344  AST 1,185* 374* 192* 74*  ALT 783* 530* 359* 215*  ALKPHOS 229* 238* 220* 153*  BILITOT 1.1 1.7* 1.3* 0.7  PROT 7.0 6.1* 6.7 6.3*  ALBUMIN 2.9* 2.4* 2.6* 2.4*   Recent Labs  Lab 06/19/23 0545  LIPASE 44   No results for  input(s): "AMMONIA" in the last 168 hours.  Coagulation Profile: Recent Labs  Lab 06/19/23 0746  INR 1.1    Cardiac Enzymes: No results for input(s): "CKTOTAL", "CKMB", "CKMBINDEX", "TROPONINI" in the last 168 hours.  BNP (last 3 results) No results for input(s): "PROBNP" in the last 8760 hours.  Lipid Profile: No results for input(s): "CHOL", "HDL", "LDLCALC", "TRIG", "CHOLHDL", "LDLDIRECT" in the last 72 hours.  Thyroid Function Tests: No results for input(s): "TSH", "T4TOTAL", "FREET4", "T3FREE", "THYROIDAB" in the last 72 hours.  Anemia Panel: No results for input(s): "VITAMINB12", "FOLATE", "FERRITIN", "TIBC", "IRON", "RETICCTPCT" in the last 72 hours.  Urine analysis:  Component Value Date/Time   COLORURINE YELLOW 06/07/2023 0324   APPEARANCEUR CLEAR 06/07/2023 0324   LABSPEC 1.014 06/07/2023 0324   PHURINE 7.0 06/07/2023 0324   GLUCOSEU NEGATIVE 06/07/2023 0324   HGBUR NEGATIVE 06/07/2023 0324   BILIRUBINUR NEGATIVE 06/07/2023 0324   KETONESUR NEGATIVE 06/07/2023 0324   PROTEINUR 100 (A) 06/07/2023 0324   UROBILINOGEN 0.2 04/22/2013 2301   NITRITE NEGATIVE 06/07/2023 0324   LEUKOCYTESUR NEGATIVE 06/07/2023 0324    Sepsis Labs: Lactic Acid, Venous    Component Value Date/Time   LATICACIDVEN 1.5 04/04/2012 1528    MICROBIOLOGY: Recent Results (from the past 240 hour(s))  Resp panel by RT-PCR (RSV, Flu A&B, Covid) Anterior Nasal Swab     Status: None   Collection Time: 06/19/23  7:07 AM   Specimen: Anterior Nasal Swab  Result Value Ref Range Status   SARS Coronavirus 2 by RT PCR NEGATIVE NEGATIVE Final   Influenza A by PCR NEGATIVE NEGATIVE Final   Influenza B by PCR NEGATIVE NEGATIVE Final    Comment: (NOTE) The Xpert Xpress SARS-CoV-2/FLU/RSV plus assay is intended as an aid in the diagnosis of influenza from Nasopharyngeal swab specimens and should not be used as a sole basis for treatment. Nasal washings and aspirates are unacceptable for Xpert  Xpress SARS-CoV-2/FLU/RSV testing.  Fact Sheet for Patients: BloggerCourse.com  Fact Sheet for Healthcare Providers: SeriousBroker.it  This test is not yet approved or cleared by the [place] FDA and has been authorized for detection and/or diagnosis of SARS-CoV-2 by FDA under an Emergency Use Authorization (EUA). This EUA will remain in effect (meaning this test can be used) for the duration of the COVID-19 declaration under Section 564(b)(1) of the Act, 21 U.S.C. section 360bbb-3(b)(1), unless the authorization is terminated or revoked.     Resp Syncytial Virus by PCR NEGATIVE NEGATIVE Final    Comment: (NOTE) Fact Sheet for Patients: BloggerCourse.com  Fact Sheet for Healthcare Providers: SeriousBroker.it  This test is not yet approved or cleared by the [place] FDA and has been authorized for detection and/or diagnosis of SARS-CoV-2 by FDA under an Emergency Use Authorization (EUA). This EUA will remain in effect (meaning this test can be used) for the duration of the COVID-19 declaration under Section 564(b)(1) of the Act, 21 U.S.C. section 360bbb-3(b)(1), unless the authorization is terminated or revoked.  Performed at [facility], [address] [place], [place] [address]     RADIOLOGY STUDIES/RESULTS: No results found.   LOS: 3 days   Jeoffrey Massed, MD  [facility]    To contact the attending provider between 7A-7P or the covering provider during after hours 7P-7A, please log into the web site [email or web address] and access using universal [place] password for that web site. If you do not have the password, please call the hospital operator.  06/22/2023, 11:16 AM

## 2023-06-22 NOTE — Progress Notes (Signed)
Progress Note  2 Days Post-Op  Subjective: Pt sore but tolerating diet. No post-op questions but wants to know how he will get back on usual HD schedule  Objective: Vital signs in last 24 hours: Temp:  [98.4 F (36.9 C)-99.4 F (37.4 C)] 98.9 F (37.2 C) (08/08 0850) Pulse Rate:  [57-65] 64 (08/08 0323) Resp:  [12-18] 18 (08/08 0850) BP: (130-159)/(47-60) 159/58 (08/08 0850) SpO2:  [90 %-100 %] 91 % (08/08 0850) Weight:  [100.1 kg] 100.1 kg (08/08 0323) Last BM Date : 06/18/23  Intake/Output from previous day: 08/07 0701 - 08/08 0700 In: 770 [P.O.:720; IV Piggyback:50] Out: -  Intake/Output this shift: No intake/output data recorded.  PE: General: pleasant, WD, WN male who is laying in bed in NAD HEENT: sclera anicteric Lungs: Respiratory effort nonlabored Abd: soft, appropriately ttp, mild distention, incisions C/D/I Psych: A&Ox3 with an appropriate affect.    Lab Results:  Recent Labs    06/20/23 0201 06/20/23 1408 06/21/23 0002  WBC 6.9  --  8.9  HGB 9.9* 12.6* 10.2*  HCT 30.8* 37.0* 32.4*  PLT 188  --  226   BMET Recent Labs    06/21/23 0002 06/21/23 2344  NA 133* 131*  K 4.2 4.3  CL 92* 90*  CO2 28 27  GLUCOSE 165* 166*  BUN 26* 35*  CREATININE 5.80* 7.74*  CALCIUM 8.2* 7.9*   PT/INR No results for input(s): "LABPROT", "INR" in the last 72 hours.  CMP     Component Value Date/Time   NA 131 (L) 06/21/2023 2344   K 4.3 06/21/2023 2344   CL 90 (L) 06/21/2023 2344   CO2 27 06/21/2023 2344   GLUCOSE 166 (H) 06/21/2023 2344   BUN 35 (H) 06/21/2023 2344   CREATININE 7.74 (H) 06/21/2023 2344   CALCIUM 7.9 (L) 06/21/2023 2344   PROT 6.3 (L) 06/21/2023 2344   ALBUMIN 2.4 (L) 06/21/2023 2344   AST 74 (H) 06/21/2023 2344   ALT 215 (H) 06/21/2023 2344   ALKPHOS 153 (H) 06/21/2023 2344   BILITOT 0.7 06/21/2023 2344   GFRNONAA 6 (L) 06/21/2023 2344   GFRAA 11 (L) 05/20/2020 0322   Lipase     Component Value Date/Time   LIPASE 44  06/19/2023 0545       Studies/Results: No results found.  Anti-infectives: Anti-infectives (From admission, onward)    Start     Dose/Rate Route Frequency Ordered Stop   06/20/23 2200  piperacillin-tazobactam (ZOSYN) IVPB 2.25 g        2.25 g 100 mL/hr over 30 Minutes Intravenous Every 8 hours 06/20/23 1645 06/24/23 2159   06/19/23 1400  piperacillin-tazobactam (ZOSYN) IVPB 2.25 g  Status:  Discontinued        2.25 g 100 mL/hr over 30 Minutes Intravenous Every 8 hours 06/19/23 1004 06/20/23 1645   06/19/23 0800  piperacillin-tazobactam (ZOSYN) IVPB 3.375 g        3.375 g 100 mL/hr over 30 Minutes Intravenous  Once 06/19/23 0755 06/19/23 0941        Assessment/Plan  POD2 s/p laparoscopic cholecystectomy   - tolerating diet - LFTs continue to downtrend - mobilize as tolerated - DC on PO augmentin to complete 5 days abx post-op - stable for DC from surgical standpoint, follow up and instructions are in AVS  FEN: renal/CM diet, IVF per TRH/renal VTE: SCDs, ok to have SQH from surgery standpoint  ID: [id number]   - per TRH -  ESRD on HD MWF HTN HLD T2DM  Hx of RBBB - last ECHO in 05/2023 with grade I diastolic dysfunction and EF 60-65% DOE Gout Anemia of CKD  LOS: 3 days     Juliet Rude, [facility] 06/22/2023, 9:08 AM Please see Amion for pager number during day hours 7:00am-4:30pm

## 2023-06-23 DIAGNOSIS — K810 Acute cholecystitis: Secondary | ICD-10-CM | POA: Diagnosis not present

## 2023-06-23 DIAGNOSIS — N186 End stage renal disease: Secondary | ICD-10-CM | POA: Diagnosis not present

## 2023-06-23 DIAGNOSIS — N189 Chronic kidney disease, unspecified: Secondary | ICD-10-CM | POA: Diagnosis not present

## 2023-06-23 DIAGNOSIS — E1165 Type 2 diabetes mellitus with hyperglycemia: Secondary | ICD-10-CM | POA: Diagnosis not present

## 2023-06-23 LAB — GLUCOSE, CAPILLARY: Glucose-Capillary: 119 mg/dL — ABNORMAL HIGH (ref 70–99)

## 2023-06-23 MED ORDER — AMOXICILLIN-POT CLAVULANATE 500-125 MG PO TABS: 1.0000 | ORAL_TABLET | ORAL | 0 refills | Status: AC

## 2023-06-23 NOTE — Discharge Summary (Signed)
PATIENT DETAILS Name: Ryan Jimenez Age: 89 y.o. Sex: male Date of Birth: May 11, 1932 MRN: [id number]. Admitting Physician: Maretta Bees, MD ELF:[id number], [name], MD  Admit Date: 06/19/2023 Discharge date: 06/23/2023  Recommendations for Outpatient Follow-up:  Follow up with PCP in 1-2 weeks Please obtain CMP/CBC in one week Please ensure follow-up with general surgery  Admitted From:  Home  Disposition: Home health   Discharge Condition: good  CODE STATUS:   Code Status: Full Code   Diet recommendation:  Diet Order             Diet - low sodium heart healthy           Diet Carb Modified           Diet renal/carb modified with fluid restriction Diet-HS Snack? Nothing; Fluid restriction: 1200 mL Fluid; Room service appropriate? Yes; Fluid consistency: Thin  Diet effective now                    Brief Summary: Patient is a 89 y.o.  male with a history of ESRD on HD MWF, DM-2, HTN, HLD-who presented with RUQ pain-thought to have acute cholecystitis and subsequently admitted to the hospitalist service.   Significant events: 8/5>> admitted to [facility]   Significant studies: 8/5>> CT abdomen/pelvis: Probable acute cholecystitis 8/5>> RUQ ultrasound: Sludge/equivocal stone gallbladder neck. 8/5>> echo: EF 60-65%, grade 1 diastolic dysfunction.   Significant microbiology data: 8/5>> COVID PCR/influenza/RSV PCR: Negative   Procedures: 8/7>> laparoscopic cholecystectomy   Consults: Nephrology GI General surgery    Brief Hospital Course: Acute calculus cholecystitis-s/p laparoscopic cholecystectomy on 8/6 Passing flatus-no BM yet but tolerating advancement in diet No further recommendations from general surgery Has been switched to Augmentin which will be continued for few more days Please ensure outpatient follow-up with general surgery.   ESRD on HD MWF Nephrology following. Has been off schedule for hemodialysis-as discussed with nephrology team and  renal navigator-patient will be discharged home-his daughter will be taking him to his outpatient dialysis center today to resume his usual HD schedule.  His last hemodialysis was yesterday.    Normocytic anemia Secondary to CKD/ESRD Iron/iron per nephrology  Hyponatremia Should improve with further HD sessions Mild-asymptomatic.   HTN BP creeping up-will resume all of his antihypertensives on discharge.  HLD Statin on hold due to transaminitis PCP to repeat LFTs in 1 week-and resume statins as LFTs continue to improve.   DM-2 (A1c 6.4 on 8/5) Per chart review/medication reconciliation-patient not on long-acting insulin any longer Continue SSI as previous.  Gout Stable Allopurinol   Chronic bronchitis Stable Bronchodilators   Obesity: Estimated body mass index is 32.85 kg/m as calculated from the following:   Height as of this encounter: 5\' 9"  (1.753 m).   Weight as of this encounter: 100.9 kg.    Discharge Diagnoses:  Principal Problem:   Acute cholecystitis Active Problems:   Hypercholesterolemia   Hypertension   ESRD on hemodialysis (HCC)   Anemia associated with chronic renal failure   Moderate protein malnutrition (HCC)   Type 2 diabetes mellitus with hyperglycemia (HCC)   Gout   Chronic bronchitis (HCC)   Discharge Instructions:  Activity:  As tolerated with Full fall precautions use walker/cane & assistance as needed Discharge Instructions     Call MD for:  persistant nausea and vomiting   Complete by: As directed    Call MD for:  redness, tenderness, or signs of infection (pain, swelling, redness, odor or green/yellow discharge around  incision site)   Complete by: As directed    Call MD for:  severe uncontrolled pain   Complete by: As directed    Diet - low sodium heart healthy   Complete by: As directed    Diet Carb Modified   Complete by: As directed    Discharge instructions   Complete by: As directed    Follow with Primary MD  Fleet Contras, MD in 1-2 weeks  Please follow up with general surgery as instructed  Your cholesterol medication-is on hold until your liver function enzymes (LFTs) improve further, please ask your primary care practitioner to repeat LFTs in 1 week-if they are significantly better-okay to resume cholesterol medication  Please get a complete blood count and chemistry panel checked by your Primary MD at your next visit, and again as instructed by your Primary MD.  Get Medicines reviewed and adjusted: Please take all your medications with you for your next visit with your Primary MD  Laboratory/radiological data: Please request your Primary MD to go over all hospital tests and procedure/radiological results at the follow up, please ask your Primary MD to get all Hospital records sent to his/her office.  In some cases, they will be blood work, cultures and biopsy results pending at the time of your discharge. Please request that your primary care M.D. follows up on these results.  Also Note the following: If you experience worsening of your admission symptoms, develop shortness of breath, life threatening emergency, suicidal or homicidal thoughts you must seek medical attention immediately by calling 911 or calling your MD immediately  if symptoms less severe.  You must read complete instructions/literature along with all the possible adverse reactions/side effects for all the Medicines you take and that have been prescribed to you. Take any new Medicines after you have completely understood and accpet all the possible adverse reactions/side effects.   Do not drive when taking Pain medications or sleeping medications (Benzodaizepines)  Do not take more than prescribed Pain, Sleep and Anxiety Medications. It is not advisable to combine anxiety,sleep and pain medications without talking with your primary care practitioner  Special Instructions: If you have smoked or chewed Tobacco  in the last 2 yrs  please stop smoking, stop any regular Alcohol  and or any Recreational drug use.  Wear Seat belts while driving.  Please note: You were cared for by a hospitalist during your hospital stay. Once you are discharged, your primary care physician will handle any further medical issues. Please note that NO REFILLS for any discharge medications will be authorized once you are discharged, as it is imperative that you return to your primary care physician (or establish a relationship with a primary care physician if you do not have one) for your post hospital discharge needs so that they can reassess your need for medications and monitor your lab values.   Increase activity slowly   Complete by: As directed       Allergies as of 06/23/2023       Reactions   Clonidine Derivatives Other (See Comments)   Not known        Medication List     STOP taking these medications    Levemir FlexTouch 100 UNIT/ML FlexTouch Pen Generic drug: insulin detemir   mirtazapine 7.5 MG tablet Commonly known as: REMERON   oxyCODONE-acetaminophen 5-325 MG tablet Commonly known as: Percocet   rosuvastatin 10 MG tablet Commonly known as: CRESTOR   sevelamer carbonate 800 MG tablet Commonly known  as: RENVELA       TAKE these medications    albuterol (2.5 MG/3ML) 0.083% nebulizer solution Commonly known as: PROVENTIL Take 3 mLs (2.5 mg total) by nebulization every 6 (six) hours as needed for wheezing or shortness of breath.   albuterol 108 (90 Base) MCG/ACT inhaler Commonly known as: VENTOLIN HFA Inhale 2 puffs into the lungs every 6 (six) hours as needed (wheezing/cough).   allopurinol 100 MG tablet Commonly known as: ZYLOPRIM Take 100 mg by mouth daily.   amLODipine 10 MG tablet Commonly known as: NORVASC Take 10 mg by mouth daily.   amoxicillin-clavulanate 500-125 MG tablet Commonly known as: AUGMENTIN Take 1 tablet by mouth daily for 2 days.   aspirin EC 81 MG tablet Take 81 mg by  mouth daily.   carvedilol 12.5 MG tablet Commonly known as: COREG Take 1 tablet (12.5 mg total) by mouth 2 (two) times daily with a meal. What changed: additional instructions   colchicine 0.6 MG tablet Take 0.6 mg by mouth daily as needed (gout).   dutasteride 0.5 MG capsule Commonly known as: AVODART Take 0.5 mg by mouth daily.   EYE DROPS ADVANCED RELIEF OP Place 1 drop into both eyes daily as needed (dry eyes).   hydrALAZINE 25 MG tablet Commonly known as: APRESOLINE Take 25 mg by mouth 2 (two) times daily.   insulin aspart 100 UNIT/ML injection Commonly known as: novoLOG Inject 4-10 Units into the skin 3 (three) times daily as needed for high blood sugar.   omeprazole 20 MG capsule Commonly known as: PRILOSEC Take 1 capsule (20 mg total) by mouth daily.   tamsulosin 0.4 MG Caps capsule Commonly known as: FLOMAX Take 1 capsule (0.4 mg total) by mouth at bedtime. What changed: how much to take        Follow-up Information     Maczis, Hedda Slade, PA-C. Go on 07/06/2023.   Specialty: General Surgery Why: 2:15 PM. Please arrive 30 min prior to appointment time to check in. Contact information: [address] [facility] [place] [address] [phone]                Allergies  Allergen Reactions   Clonidine Derivatives Other (See Comments)    Not known     Other Procedures/Studies: ECHOCARDIOGRAM COMPLETE  Result Date: 06/19/2023    ECHOCARDIOGRAM REPORT   Patient Name:   Ryan Jimenez Date of Exam: 06/19/2023 Medical Rec #:  [id number]        Height:       69.0 in Accession #:    [id number]       Weight:       198.0 lb Date of Birth:  August 18, 1932        BSA:          2.057 m Patient Age:    89 years         BP:           160/62 mmHg Patient Gender: M                HR:           65 bpm. Exam Location:  Inpatient Procedure: 2D Echo, Color Doppler, Cardiac Doppler and Intracardiac            Opacification Agent Indications:     Abnormal ECG                 Congestive Heart Failure  History:  Patient has prior history of Echocardiogram examinations, most                 recent 05/17/2020. Signs/Symptoms:Dyspnea and Shortness of Breath;                 Risk Factors:Diabetes, Dyslipidemia and Hypertension.  Sonographer:    Raeford Razor Referring Phys: Ernestene Kiel ORTIZ  Sonographer Comments: Technically difficult study due to poor echo windows. Image acquisition challenging due to patient body habitus. IMPRESSIONS  1. Left ventricular ejection fraction, by estimation, is 60 to 65%. The left ventricle has normal function. The left ventricle has no regional wall motion abnormalities. Left ventricular diastolic parameters are consistent with Grade I diastolic dysfunction (impaired relaxation).  2. Right ventricular systolic function is normal. The right ventricular size is normal. Tricuspid regurgitation signal is inadequate for assessing PA pressure.  3. The mitral valve is normal in structure. No evidence of mitral valve regurgitation.  4. The aortic valve is tricuspid. Aortic valve regurgitation is not visualized.  5. The inferior vena cava is normal in size with greater than 50% respiratory variability, suggesting right atrial pressure of 3 mmHg. FINDINGS  Left Ventricle: Left ventricular ejection fraction, by estimation, is 60 to 65%. The left ventricle has normal function. The left ventricle has no regional wall motion abnormalities. Definity contrast agent was given IV to delineate the left ventricular  endocardial borders. The left ventricular internal cavity size was normal in size. There is no left ventricular hypertrophy. Left ventricular diastolic parameters are consistent with Grade I diastolic dysfunction (impaired relaxation). Right Ventricle: The right ventricular size is normal. Right ventricular systolic function is normal. Tricuspid regurgitation signal is inadequate for assessing PA pressure. Left Atrium: Left atrial  size was normal in size. Right Atrium: Right atrial size was normal in size. Pericardium: There is no evidence of pericardial effusion. Mitral Valve: The mitral valve is normal in structure. No evidence of mitral valve regurgitation. Tricuspid Valve: Tricuspid valve regurgitation is not demonstrated. Aortic Valve: The aortic valve is tricuspid. Aortic valve regurgitation is not visualized. Aortic valve peak gradient measures 6.9 mmHg. Pulmonic Valve: The pulmonic valve was normal in structure. Pulmonic valve regurgitation is not visualized. Aorta: The aortic root and ascending aorta are structurally normal, with no evidence of dilitation. Venous: The inferior vena cava is normal in size with greater than 50% respiratory variability, suggesting right atrial pressure of 3 mmHg. IAS/Shunts: No atrial level shunt detected by color flow Doppler.  LEFT VENTRICLE PLAX 2D LVIDd:         5.50 cm   Diastology LVIDs:         3.60 cm   LV e' medial:    4.35 cm/s LV PW:         0.90 cm   LV E/e' medial:  16.7 LV IVS:        0.90 cm   LV e' lateral:   9.46 cm/s LVOT diam:     2.20 cm   LV E/e' lateral: 7.7 LV SV:         88 LV SV Index:   43 LVOT Area:     3.80 cm  RIGHT VENTRICLE             IVC RV Basal diam:  2.90 cm     IVC diam: 1.30 cm RV S prime:     12.00 cm/s TAPSE (M-mode): 2.3 cm LEFT ATRIUM  Index        RIGHT ATRIUM           Index LA diam:        3.60 cm 1.75 cm/m   RA Area:     13.00 cm LA Vol (A2C):   47.3 ml 22.99 ml/m  RA Volume:   29.20 ml  14.19 ml/m LA Vol (A4C):   67.8 ml 32.96 ml/m LA Biplane Vol: 60.0 ml 29.17 ml/m  AORTIC VALVE AV Area (Vmax): 3.31 cm AV Vmax:        131.00 cm/s AV Peak Grad:   6.9 mmHg LVOT Vmax:      114.00 cm/s LVOT Vmean:     69.400 cm/s LVOT VTI:       0.231 m  AORTA Ao Root diam: 3.20 cm Ao Asc diam:  3.60 cm MITRAL VALVE MV Area (PHT): 2.91 cm    SHUNTS MV Decel Time: 261 msec    Systemic VTI:  0.23 m MV E velocity: 72.50 cm/s  Systemic Diam: 2.20 cm MV A  velocity: 92.20 cm/s MV E/A ratio:  0.79 Photographer signed by Carolan Clines Signature Date/Time: 06/19/2023/5:53:14 PM    Final    CT ABDOMEN PELVIS W CONTRAST  Result Date: 06/19/2023 CLINICAL DATA:  Bowel obstruction suspected EXAM: CT ABDOMEN AND PELVIS WITH CONTRAST TECHNIQUE: Multidetector CT imaging of the abdomen and pelvis was performed using the standard protocol following bolus administration of intravenous contrast. RADIATION DOSE REDUCTION: This exam was performed according to the departmental dose-optimization program which includes automated exposure control, adjustment of the mA and/or kV according to patient size and/or use of iterative reconstruction technique. CONTRAST:  75mL OMNIPAQUE IOHEXOL 350 MG/ML SOLN COMPARISON:  06/07/2023 FINDINGS: Lower chest: There are patchy infiltrates seen both lower lung fields. There is interval worsening of infiltrate in right lower lung field. Minimal right pleural effusion is seen. Coronary artery calcifications are seen. Hepatobiliary: There is mild periportal edema. There is gallbladder wall thickening. These small amount of fluid adjacent to the gallbladder. Small ascites is noted in perihepatic region. There is no dilation of bile ducts. Pancreas: No focal abnormalities are seen. Spleen: Unremarkable. Adrenals/Urinary Tract: Adrenals are unremarkable. Both kidneys are smaller than usual incised with cortical atrophy. This finding has not changed. There is no hydronephrosis. There are no renal or ureteral stones. Urinary bladder is unremarkable. Stomach/Bowel: Stomach is not distended. Small bowel loops are not dilated. Appendix is not dilated. Cecum is higher and more medial than usual in position. Multiple diverticula are seen in colon. There are no signs of focal diverticulitis. Vascular/Lymphatic: Calcifications are seen in aorta and its major branches. Reproductive: Unremarkable. Other: There is no pneumoperitoneum. Umbilical hernia  containing fat is seen. Musculoskeletal: Schmorl's nodes are seen in multiple vertebrae. Degenerative changes are noted in lumbar spine. No interval changes are noted. IMPRESSION: Gallbladder is distended. There is wall thickening in gallbladder and pericholecystic fluid. Possibility of cholecystitis is not excluded. Please correlate with findings in the sonogram done earlier today. There is mild periportal edema which may be due to nonspecific hepatitis. There is small perihepatic ascites. There is no evidence of intestinal obstruction or pneumoperitoneum. There is no hydronephrosis. Appendix is not dilated. Diverticulosis of colon without signs of diverticulitis. Aortic arteriosclerosis. Coronary artery calcifications are seen. Lumbar spondylosis. There are patchy infiltrates in the lower lung fields with interval worsening in right lower lung field suggesting atelectasis/pneumonia. Minimal right pleural effusion. Electronically Signed   By: Harlan Stains.D.  On: 06/19/2023 09:28   DG Abdomen Acute W/Chest  Result Date: 06/19/2023 CLINICAL DATA:  89 year old male with history of chest pain and abdominal distension. Epigastric pain. EXAM: DG ABDOMEN ACUTE WITH 1 VIEW CHEST COMPARISON:  Chest x-ray 05/19/2020. FINDINGS: Lung volumes are low. Bibasilar opacities are noted, which may reflect areas of atelectasis and/or consolidation. Blunting of the costophrenic sulci bilaterally suggestive of small bilateral pleural effusions. No pneumothorax. No evidence of pulmonary edema. Heart size is normal. Atherosclerotic calcifications are noted in the thoracic aorta. Dedicated views of the abdomen demonstrates some mildly dilated loops of small bowel in the central abdomen measuring up to 3.8 cm in diameter. Paucity of distal colonic gas. No definite pneumoperitoneum. IMPRESSION: 1. Abnormal bowel-gas pattern concerning for early or partial small bowel obstruction. 2. No pneumoperitoneum. 3. Low lung volumes with  bibasilar opacities which may reflect areas of atelectasis and/or consolidation, likely with superimposed small bilateral pleural effusions. 4. Aortic atherosclerosis. Electronically Signed   By: Trudie Reed M.D.   On: 06/19/2023 06:30   US Abdomen Limited  Result Date: 06/19/2023 CLINICAL DATA:  Right upper quadrant pain EXAM: ULTRASOUND ABDOMEN LIMITED RIGHT UPPER QUADRANT COMPARISON:  06/07/2023 abdominal CT FINDINGS: Gallbladder: Sludge with possible stone at the neck measuring 9 mm, although not clearly shadowing. No focal tenderness or wall thickening. Common bile duct: Diameter: 7 mm, normal for age. Liver: No focal lesion identified. Within normal limits in parenchymal echogenicity. Portal vein is patent on color Doppler imaging with normal direction of blood flow towards the liver. IMPRESSION: Gallbladder sludge and equivocal stone in the gallbladder neck. A small gallstone was seen by CT recently. No evidence of cholecystitis. Electronically Signed   By: Tiburcio Pea M.D.   On: 06/19/2023 06:18   CT ABDOMEN PELVIS W CONTRAST  Result Date: 06/07/2023 CLINICAL DATA:  Nonlocalized abdominal pain. Dialysis patient with right-sided fistula. EXAM: CT ABDOMEN AND PELVIS WITH CONTRAST TECHNIQUE: Multidetector CT imaging of the abdomen and pelvis was performed using the standard protocol following bolus administration of intravenous contrast. RADIATION DOSE REDUCTION: This exam was performed according to the departmental dose-optimization program which includes automated exposure control, adjustment of the mA and/or kV according to patient size and/or use of iterative reconstruction technique. CONTRAST:  75mL OMNIPAQUE IOHEXOL 350 MG/ML SOLN COMPARISON:  CT without contrast 05/18/2016 FINDINGS: Lower chest: Chronic elevation right hemidiaphragm. The cardiac size is normal. There are patchy three-vessel coronary artery calcifications. No pericardial effusion. Compared with 2017 there is increased  opacity in the left lower lobe and lingular base, findings which could be due to atelectasis, pneumonia or aspiration. There is subpleural atelectasis posteriorly in the right lower lobe. Rest of the lung bases are clear. Hepatobiliary: The liver 19 cm length with mild steatosis. There is no mass enhancement. There is a single tiny stone in the proximal gallbladder but no wall thickening or biliary dilatation. Pancreas: No abnormality. Spleen: No abnormality. Adrenals/Urinary Tract: There is no adrenal mass. Both kidneys are small in size with interval bilateral volume loss since 2017. Right kidney is 8.7 cm length, left kidney is 8.9 cm length, both with diffuse cortical thinning neither demonstrating mass enhancement. There's symmetric but diminished excretion in the delayed phase. There is no urinary stone or obstruction. The bladder is unremarkable for the degree of distention. Stomach/Bowel: There are thickened folds in the mid and distal stomach but no inflammatory changes or evidence of penetrating ulcers. The unopacified small bowel is normal caliber. The appendix is normal. There is moderate  retained stool in the ascending and transverse colon. Left colonic diverticulosis noted diffusely. No findings of acute diverticulitis or rectal impaction. Vascular/Lymphatic: There is extensive vascular calcification, including heavy visceral arterial plaques greatest in the renal arteries. No AAA. No lymphadenopathy is seen. Reproductive: No prostatomegaly. Other: Small umbilical fat hernia. No free air, free hemorrhage or free fluid. Musculoskeletal: There are advanced degenerative changes of the spine, with increased cystic Schmorl's nodes in the lumbar endplates compared with 2017. There is bulky lumbar marginal osteophytosis and multilevel thoracic spine bridging enthesopathy. There is advanced lumbar facet hypertrophy with acquired spinal stenosis L3-4 and L4-5. Ankylosis across both SI joints is again seen.  There are no destructive bone lesions. IMPRESSION: 1. Increased opacity in the left lower lobe and lingular base, which could be due to atelectasis, pneumonia or aspiration. Follow-up as indicated. 2. Constipation and diverticulosis. 3. Cholelithiasis without evidence of cholecystitis. 4. Bilateral renal atrophy with cortical thinning and diminished excretion in the delayed phase. 5. Extensive vascular calcifications including visceral arterial plaques. 6. Probable gastritis. 7. Advanced degenerative changes of the spine with acquired spinal stenosis L3-4 and L4-5. 8. Small umbilical fat hernia. Electronically Signed   By: Almira Bar M.D.   On: 06/07/2023 03:26     TODAY-DAY OF DISCHARGE:  Subjective:   Ryan Jimenez today has no headache,no chest abdominal pain,no new weakness tingling or numbness, feels much better wants to go home today.   Objective:   Blood pressure (!) 159/62, pulse 67, temperature 98.3 F (36.8 C), temperature source Oral, resp. rate 16, height 5\' 9"  (1.753 m), weight 100.9 kg, SpO2 95%.  Intake/Output Summary (Last 24 hours) at 06/23/2023 0825 Last data filed at 06/23/2023 0300 Gross per 24 hour  Intake 485.43 ml  Output 3011.94 ml  Net -2526.51 ml   Filed Weights   06/22/23 1415 06/22/23 1801 06/23/23 0349  Weight: 98.6 kg 97.6 kg 100.9 kg    Exam: Awake Alert, Oriented *3, No new F.N deficits, Normal affect [place].AT,PERRAL Supple Neck,No JVD, No cervical lymphadenopathy appriciated.  Symmetrical Chest wall movement, Good air movement bilaterally, CTAB RRR,No Gallops,Rubs or new Murmurs, No Parasternal Heave +ve B.Sounds, Abd Soft, Non tender, No organomegaly appriciated, No rebound -guarding or rigidity. No Cyanosis, Clubbing or edema, No new Rash or bruise   PERTINENT RADIOLOGIC STUDIES: No results found.   PERTINENT LAB RESULTS: CBC: Recent Labs    06/21/23 0002 06/22/23 1031  WBC 8.9 8.0  HGB 10.2* 10.2*  HCT 32.4* 32.0*  PLT 226 221    CMET CMP     Component Value Date/Time   NA 128 (L) 06/22/2023 1031   K 4.4 06/22/2023 1031   CL 88 (L) 06/22/2023 1031   CO2 24 06/22/2023 1031   GLUCOSE 167 (H) 06/22/2023 1031   BUN 38 (H) 06/22/2023 1031   CREATININE 8.43 (H) 06/22/2023 1031   CALCIUM 8.0 (L) 06/22/2023 1031   PROT 6.3 (L) 06/21/2023 2344   ALBUMIN 2.5 (L) 06/22/2023 1031   AST 74 (H) 06/21/2023 2344   ALT 215 (H) 06/21/2023 2344   ALKPHOS 153 (H) 06/21/2023 2344   BILITOT 0.7 06/21/2023 2344   GFRNONAA 6 (L) 06/22/2023 1031    GFR Estimated Creatinine Clearance: 7 mL/min (A) (by C-G formula based on SCr of 8.43 mg/dL (H)). No results for input(s): "LIPASE", "AMYLASE" in the last 72 hours. No results for input(s): "CKTOTAL", "CKMB", "CKMBINDEX", "TROPONINI" in the last 72 hours. Invalid input(s): "POCBNP" No results for input(s): "DDIMER" in the last  72 hours. No results for input(s): "HGBA1C" in the last 72 hours. No results for input(s): "CHOL", "HDL", "LDLCALC", "TRIG", "CHOLHDL", "LDLDIRECT" in the last 72 hours. No results for input(s): "TSH", "T4TOTAL", "T3FREE", "THYROIDAB" in the last 72 hours.  Invalid input(s): "FREET3" No results for input(s): "VITAMINB12", "FOLATE", "FERRITIN", "TIBC", "IRON", "RETICCTPCT" in the last 72 hours. Coags: No results for input(s): "INR" in the last 72 hours.  Invalid input(s): "PT" Microbiology: Recent Results (from the past 240 hour(s))  Resp panel by RT-PCR (RSV, Flu A&B, Covid) Anterior Nasal Swab     Status: None   Collection Time: 06/19/23  7:07 AM   Specimen: Anterior Nasal Swab  Result Value Ref Range Status   SARS Coronavirus 2 by RT PCR NEGATIVE NEGATIVE Final   Influenza A by PCR NEGATIVE NEGATIVE Final   Influenza B by PCR NEGATIVE NEGATIVE Final    Comment: (NOTE) The Xpert Xpress SARS-CoV-2/FLU/RSV plus assay is intended as an aid in the diagnosis of influenza from Nasopharyngeal swab specimens and should not be used as a sole basis for  treatment. Nasal washings and aspirates are unacceptable for Xpert Xpress SARS-CoV-2/FLU/RSV testing.  Fact Sheet for Patients: BloggerCourse.com  Fact Sheet for Healthcare Providers: SeriousBroker.it  This test is not yet approved or cleared by the [place] FDA and has been authorized for detection and/or diagnosis of SARS-CoV-2 by FDA under an Emergency Use Authorization (EUA). This EUA will remain in effect (meaning this test can be used) for the duration of the COVID-19 declaration under Section 564(b)(1) of the Act, 21 U.S.C. section 360bbb-3(b)(1), unless the authorization is terminated or revoked.     Resp Syncytial Virus by PCR NEGATIVE NEGATIVE Final    Comment: (NOTE) Fact Sheet for Patients: BloggerCourse.com  Fact Sheet for Healthcare Providers: SeriousBroker.it  This test is not yet approved or cleared by the [place] FDA and has been authorized for detection and/or diagnosis of SARS-CoV-2 by FDA under an Emergency Use Authorization (EUA). This EUA will remain in effect (meaning this test can be used) for the duration of the COVID-19 declaration under Section 564(b)(1) of the Act, 21 U.S.C. section 360bbb-3(b)(1), unless the authorization is terminated or revoked.  Performed at [facility], [address] [place], [place] [address]     FURTHER DISCHARGE INSTRUCTIONS:  Get Medicines reviewed and adjusted: Please take all your medications with you for your next visit with your Primary MD  Laboratory/radiological data: Please request your Primary MD to go over all hospital tests and procedure/radiological results at the follow up, please ask your Primary MD to get all Hospital records sent to his/her office.  In some cases, they will be blood work, cultures and biopsy results pending at the time of your discharge. Please request that your  primary care M.D. goes through all the records of your hospital data and follows up on these results.  Also Note the following: If you experience worsening of your admission symptoms, develop shortness of breath, life threatening emergency, suicidal or homicidal thoughts you must seek medical attention immediately by calling 911 or calling your MD immediately  if symptoms less severe.  You must read complete instructions/literature along with all the possible adverse reactions/side effects for all the Medicines you take and that have been prescribed to you. Take any new Medicines after you have completely understood and accpet all the possible adverse reactions/side effects.   Do not drive when taking Pain medications or sleeping medications (Benzodaizepines)  Do not take more  than prescribed Pain, Sleep and Anxiety Medications. It is not advisable to combine anxiety,sleep and pain medications without talking with your primary care practitioner  Special Instructions: If you have smoked or chewed Tobacco  in the last 2 yrs please stop smoking, stop any regular Alcohol  and or any Recreational drug use.  Wear Seat belts while driving.  Please note: You were cared for by a hospitalist during your hospital stay. Once you are discharged, your primary care physician will handle any further medical issues. Please note that NO REFILLS for any discharge medications will be authorized once you are discharged, as it is imperative that you return to your primary care physician (or establish a relationship with a primary care physician if you do not have one) for your post hospital discharge needs so that they can reassess your need for medications and monitor your lab values.  Total Time spent coordinating discharge including counseling, education and face to face time equals greater than 30 minutes.  Signed:   06/23/2023 8:25 AM

## 2023-06-23 NOTE — TOC Transition Note (Signed)
Transition of Care [facility]) - CM/SW Discharge Note   Patient Details  Name: Ryan Jimenez MRN: [id number] Date of Birth: 1932-03-21  [facility] CM/SW Contact:  Gordy Clement, RN Phone Number: 06/23/2023, 9:19 AM   Clinical Narrative:     Patient to DC to home with [facility]) and a rolling walker ([employer]) Daughter to transport home  Information given regarding transportation services and application sent to [employer] on behalf of patient    No additional TOC needs           Patient Goals and CMS Choice      Discharge Placement                         Discharge Plan and Services Additional resources added to the After Visit Summary for                                       Social Determinants of Health (SDOH) Interventions SDOH Screenings   Food Insecurity: No Food Insecurity (06/19/2023)  Housing: Low Risk  (06/19/2023)  Transportation Needs: No Transportation Needs (06/19/2023)  Utilities: Not At Risk (06/19/2023)  Tobacco Use: Medium Risk (06/20/2023)     Readmission Risk Interventions     No data to display

## 2023-06-23 NOTE — Progress Notes (Signed)
D/C order noted. Contacted [facility] to advise staff that pt will d/c this morning and will come to clinic for 2nd shift appt today. Discussed with pt and pt's daughter yesterday.   Olivia Canter Renal Navigator [phone]

## 2023-06-23 NOTE — Plan of Care (Signed)
  Problem: Health Behavior/Discharge Planning: Goal: Ability to manage health-related needs will improve Outcome: Progressing   Problem: Nutritional: Goal: Progress toward achieving an optimal weight will improve Outcome: Progressing   Problem: Education: Goal: Knowledge of General Education information will improve Description: Including pain rating scale, medication(s)/side effects and non-pharmacologic comfort measures Outcome: Progressing   Problem: Clinical Measurements: Goal: Ability to maintain clinical measurements within normal limits will improve Outcome: Progressing   Problem: Clinical Measurements: Goal: Will remain free from infection Outcome: Progressing   Problem: Clinical Measurements: Goal: Diagnostic test results will improve Outcome: Progressing

## 2023-06-23 NOTE — Plan of Care (Signed)
[facility] Patient Discharge Orders- [facility] CLINIC: [facility]  Patient's name: Ryan Jimenez Admit/DC Dates: 06/19/2023 - 06/23/23  Discharge Diagnoses: Acute calculus cholecystitis-s/p  laparoscopic cholecystectomy on 8/6    Aranesp: Given: No    Last Hgb: 10.2 PRBC's Given: No  ESA dose for discharge: No ESA for now, doesn't appear he's on an ESA at [facility]\ IV Iron dose at discharge: Continue weekly Venofer  Heparin change: No  EDW Change: No   Bath Change: No  Access intervention/Change: No   Calcitriol change: No  Discharge Labs: Calcium 8.0 Phosphorus 6.1 Albumin 2.5 K+ 4.4  IV Antibiotics: Yes Details: Acute calculus cholecystitis: Patient to complete PO Augmentin for the next 2 days. Dose is renal appropriate  On Coumadin?: No   OTHER/APPTS/LAB ORDERS:    D/C Meds to be reconciled by nurse after every discharge.  Completed By: Salome Holmes, NP   Reviewed by: MD:______ RN_______

## 2023-06-25 ENCOUNTER — Telehealth: Payer: Self-pay | Admitting: Physician Assistant

## 2023-06-25 NOTE — Telephone Encounter (Signed)
Transition of care contact from inpatient facility  Date of Discharge: 06/23/23 Date of Contact: 06/25/23 Method of contact: Phone  Attempted to contact patient to discuss transition of care from inpatient admission. Patient's mailbox was full. Cell phone number belongs to daughter, no answer. We will follow up with patient at his outpatient dialysis unit.  Rogers Blocker, PA-C 06/25/2023, 11:21 AM  [facility] Pager: [phone]

## 2023-07-03 ENCOUNTER — Other Ambulatory Visit: Payer: Self-pay | Admitting: Internal Medicine

## 2023-07-04 LAB — CBC
HCT: 33.2 % — ABNORMAL LOW (ref 38.5–50.0)
Hemoglobin: 10.6 g/dL — ABNORMAL LOW (ref 13.2–17.1)
MCH: 29.7 pg (ref 27.0–33.0)
MCHC: 31.9 g/dL — ABNORMAL LOW (ref 32.0–36.0)
MCV: 93 fL (ref 80.0–100.0)
MPV: 10 fL (ref 7.5–12.5)
Platelets: 223 10*3/uL (ref 140–400)
RBC: 3.57 10*6/uL — ABNORMAL LOW (ref 4.20–5.80)
RDW: 14.1 % (ref 11.0–15.0)
WBC: 5.3 10*3/uL (ref 3.8–10.8)

## 2023-07-04 LAB — COMPLETE METABOLIC PANEL WITH GFR
AG Ratio: 1.1 (calc) (ref 1.0–2.5)
ALT: 28 U/L (ref 9–46)
AST: 21 U/L (ref 10–35)
Albumin: 3.7 g/dL (ref 3.6–5.1)
Alkaline phosphatase (APISO): 103 U/L (ref 35–144)
BUN/Creatinine Ratio: 6 (calc) (ref 6–22)
BUN: 37 mg/dL — ABNORMAL HIGH (ref 7–25)
CO2: 28 mmol/L (ref 20–32)
Calcium: 9 mg/dL (ref 8.6–10.3)
Chloride: 95 mmol/L — ABNORMAL LOW (ref 98–110)
Creat: 6.6 mg/dL — ABNORMAL HIGH (ref 0.70–1.22)
Globulin: 3.4 g/dL (ref 1.9–3.7)
Glucose, Bld: 156 mg/dL — ABNORMAL HIGH (ref 65–99)
Potassium: 4 mmol/L (ref 3.5–5.3)
Sodium: 139 mmol/L (ref 135–146)
Total Bilirubin: 0.4 mg/dL (ref 0.2–1.2)
Total Protein: 7.1 g/dL (ref 6.1–8.1)
eGFR: 7 mL/min/{1.73_m2} — ABNORMAL LOW (ref >=60)

## 2023-07-04 LAB — LIPID PANEL
Cholesterol: 189 mg/dL (ref <200)
HDL: 43 mg/dL (ref >=40)
LDL Cholesterol (Calc): 111 mg/dL — ABNORMAL HIGH
Non-HDL Cholesterol (Calc): 146 mg/dL — ABNORMAL HIGH (ref <130)
Total CHOL/HDL Ratio: 4.4 (calc) (ref <5.0)
Triglycerides: 248 mg/dL — ABNORMAL HIGH (ref <150)

## 2023-07-04 LAB — URIC ACID: Uric Acid, Serum: 4.9 mg/dL (ref 4.0–8.0)

## 2023-07-04 LAB — TSH: TSH: 3.12 m[IU]/L (ref 0.40–4.50)

## 2024-06-10 ENCOUNTER — Encounter (HOSPITAL_COMMUNITY): Payer: Self-pay | Admitting: *Deleted

## 2024-06-25 ENCOUNTER — Ambulatory Visit (INDEPENDENT_AMBULATORY_CARE_PROVIDER_SITE_OTHER): Admitting: Podiatry

## 2024-06-25 DIAGNOSIS — M79674 Pain in right toe(s): Secondary | ICD-10-CM | POA: Diagnosis not present

## 2024-06-25 DIAGNOSIS — E1142 Type 2 diabetes mellitus with diabetic polyneuropathy: Secondary | ICD-10-CM

## 2024-06-25 DIAGNOSIS — B351 Tinea unguium: Secondary | ICD-10-CM

## 2024-06-25 DIAGNOSIS — E119 Type 2 diabetes mellitus without complications: Secondary | ICD-10-CM

## 2024-06-25 DIAGNOSIS — M79675 Pain in left toe(s): Secondary | ICD-10-CM

## 2024-06-25 DIAGNOSIS — L600 Ingrowing nail: Secondary | ICD-10-CM

## 2024-06-25 NOTE — Progress Notes (Signed)
 Chief Complaint  Patient presents with   Nail Problem    Thick painful toenails - former patient of Dr. Gaynel    Diabetes    A1C 6.7 , on insulin   HPI: 89 y.o. male presents today, with a family member, for a diabetic foot check and for nail care.  He feels that his left great toenail was slightly ingrown.  He states he will be relocating up to [place]  soon so he will not be scheduling a follow-up.  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   Diabetes mellitus    Dyspnea    Dysrhythmia    Glomerulonephritis    Gout    History of pneumonia    History of right bundle branch block (RBBB)    Hypercholesterolemia    Hypertension    Near syncope 10/2011; 11/2011   Pancytopenia    Pneumonia    Renal disorder    Shortness of breath on exertion    Type 2 diabetes mellitus (HCC)    Past Surgical History:  Procedure Laterality Date   AV FISTULA PLACEMENT Right 03/10/2020   Procedure: RIGHT BRACHIOCEPHALIC ARTERIOVENOUS (AV) FISTULA CREATION;  Surgeon: Harvey Carlin BRAVO, MD;  Location: [facility] OR;  Service: Vascular;  Laterality: Right;   CATARACT EXTRACTION W/ INTRAOCULAR LENS IMPLANT  ~ 2009   right   CHOLECYSTECTOMY N/A 06/20/2023   Procedure: LAPAROSCOPIC CHOLECYSTECTOMY;  Surgeon: Lyndel Deward PARAS, MD;  Location: [facility] OR;  Service: General;  Laterality: N/A;   FISTULA SUPERFICIALIZATION Right 05/11/2020   Procedure: FISTULA REVISION, BRANCH LIGATION AND SUPERFICIALIZATION OF RIGHT UPPER ARM FISTULA;  Surgeon: Gretta Lonni PARAS, MD;  Location: [facility] OR;  Service: Vascular;  Laterality: Right;   HYDROCELE EXCISION     Allergies  Allergen Reactions   Clonidine Derivatives Other (See Comments)    Not known    Physical Exam: General: The patient is alert and oriented x3 in no acute distress.  Dermatology: Skin is warm, dry and supple bilateral lower extremities. Interspaces are clear of maceration  and debris.  The nails x 10 are 3 mm thick, with distal onycholysis, yellow and brown discoloration, subungual debris, and pain with compression.  Left hallux medial nail is incurvated but no signs of paronychia, edema, erythema noted.  There is pain on palpation of the border.  Vascular: Palpable pedal pulses bilaterally. Capillary refill within normal limits.  No appreciable edema.  No erythema or calor.  Neurological: Light touch and protective sensation intact bilateral feet.  There is loss of vibratory sensation to the forefoot bilateral  Musculoskeletal Exam: Mild bony prominence on the first met head bilateral consistent with bunion deformity.  Assessment/Plan of Care: 1. Pain due to onychomycosis of toenails of both feet   2. Encounter for diabetic foot exam (HCC)   3. Ingrown toenail   4. Type 2 diabetes mellitus with peripheral neuropathy [facility])    Discussed clinical findings with patient today.  Mycotic nails x 10 were debrided with sterile nail nippers and a power debriding bur to decrease bulk and length.  The left hallux medial nail border was cut back to alleviate any discomfort.  Follow-up as needed.  Informed patient that if his doctor in [place]  would like a copy of today's note, they can perform a medical records request and we can send any requested notes to their office.  His local  PCP will get today's note.  Awanda CHARM Imperial, DPM, FACFAS [facility]     [address] [place], [place] [address]                Office [phone]  Fax [phone]
